# Patient Record
Sex: Female | Born: 1955 | Race: White | Hispanic: No | Marital: Single | Smoking: Never smoker
Health system: Southern US, Community
[De-identification: ages and names within clinical notes are randomized; demographics above are authoritative.]

## PROBLEM LIST (undated history)

## (undated) DIAGNOSIS — M1990 Unspecified osteoarthritis, unspecified site: Secondary | ICD-10-CM

## (undated) DIAGNOSIS — E669 Obesity, unspecified: Secondary | ICD-10-CM

## (undated) DIAGNOSIS — K257 Chronic gastric ulcer without hemorrhage or perforation: Secondary | ICD-10-CM

## (undated) DIAGNOSIS — IMO0002 Reserved for concepts with insufficient information to code with codable children: Secondary | ICD-10-CM

## (undated) DIAGNOSIS — I728 Aneurysm of other specified arteries: Secondary | ICD-10-CM

## (undated) DIAGNOSIS — K219 Gastro-esophageal reflux disease without esophagitis: Secondary | ICD-10-CM

## (undated) DIAGNOSIS — C801 Malignant (primary) neoplasm, unspecified: Secondary | ICD-10-CM

## (undated) DIAGNOSIS — I499 Cardiac arrhythmia, unspecified: Secondary | ICD-10-CM

## (undated) DIAGNOSIS — D509 Iron deficiency anemia, unspecified: Secondary | ICD-10-CM

## (undated) DIAGNOSIS — Z8719 Personal history of other diseases of the digestive system: Secondary | ICD-10-CM

## (undated) DIAGNOSIS — Z862 Personal history of diseases of the blood and blood-forming organs and certain disorders involving the immune mechanism: Secondary | ICD-10-CM

## (undated) DIAGNOSIS — G4730 Sleep apnea, unspecified: Secondary | ICD-10-CM

## (undated) DIAGNOSIS — I10 Essential (primary) hypertension: Secondary | ICD-10-CM

## (undated) DIAGNOSIS — F419 Anxiety disorder, unspecified: Secondary | ICD-10-CM

## (undated) DIAGNOSIS — E7800 Pure hypercholesterolemia, unspecified: Secondary | ICD-10-CM

## (undated) HISTORY — DX: Sleep apnea, unspecified: G47.30

## (undated) HISTORY — DX: Reserved for concepts with insufficient information to code with codable children: IMO0002

## (undated) HISTORY — DX: Chronic gastric ulcer without hemorrhage or perforation: K25.7

## (undated) HISTORY — DX: Gastro-esophageal reflux disease without esophagitis: K21.9

## (undated) HISTORY — DX: Unspecified osteoarthritis, unspecified site: M19.90

## (undated) HISTORY — DX: Pure hypercholesterolemia, unspecified: E78.00

## (undated) HISTORY — DX: Iron deficiency anemia, unspecified: D50.9

## (undated) HISTORY — DX: Personal history of diseases of the blood and blood-forming organs and certain disorders involving the immune mechanism: Z86.2

## (undated) HISTORY — DX: Obesity, unspecified: E66.9

---
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
## 1996-06-16 DIAGNOSIS — IMO0002 Reserved for concepts with insufficient information to code with codable children: Secondary | ICD-10-CM

## 1996-06-16 DIAGNOSIS — R87619 Unspecified abnormal cytological findings in specimens from cervix uteri: Secondary | ICD-10-CM

## 1996-06-16 HISTORY — DX: Reserved for concepts with insufficient information to code with codable children: IMO0002

## 1996-06-16 HISTORY — DX: Unspecified abnormal cytological findings in specimens from cervix uteri: R87.619

## 1997-09-04 ENCOUNTER — Other Ambulatory Visit: Admission: RE | Admit: 1997-09-04 | Discharge: 1997-09-04 | Payer: Self-pay | Admitting: *Deleted

## 1998-03-05 ENCOUNTER — Other Ambulatory Visit: Admission: RE | Admit: 1998-03-05 | Discharge: 1998-03-05 | Payer: Self-pay | Admitting: *Deleted

## 1998-05-16 ENCOUNTER — Other Ambulatory Visit: Admission: RE | Admit: 1998-05-16 | Discharge: 1998-05-16 | Payer: Self-pay | Admitting: *Deleted

## 1999-04-03 ENCOUNTER — Other Ambulatory Visit: Admission: RE | Admit: 1999-04-03 | Discharge: 1999-04-03 | Payer: Self-pay | Admitting: *Deleted

## 2000-01-08 ENCOUNTER — Other Ambulatory Visit: Admission: RE | Admit: 2000-01-08 | Discharge: 2000-01-08 | Payer: Self-pay | Admitting: *Deleted

## 2000-12-10 ENCOUNTER — Other Ambulatory Visit: Admission: RE | Admit: 2000-12-10 | Discharge: 2000-12-10 | Payer: Self-pay | Admitting: *Deleted

## 2002-03-07 ENCOUNTER — Other Ambulatory Visit: Admission: RE | Admit: 2002-03-07 | Discharge: 2002-03-07 | Payer: Self-pay | Admitting: *Deleted

## 2003-01-03 ENCOUNTER — Encounter: Admission: RE | Admit: 2003-01-03 | Discharge: 2003-04-03 | Payer: Self-pay | Admitting: Family Medicine

## 2003-06-28 ENCOUNTER — Other Ambulatory Visit: Admission: RE | Admit: 2003-06-28 | Discharge: 2003-06-28 | Payer: Self-pay | Admitting: *Deleted

## 2004-10-10 ENCOUNTER — Other Ambulatory Visit: Admission: RE | Admit: 2004-10-10 | Discharge: 2004-10-10 | Payer: Self-pay | Admitting: *Deleted

## 2005-07-22 ENCOUNTER — Encounter: Admission: RE | Admit: 2005-07-22 | Discharge: 2005-10-20 | Payer: Self-pay | Admitting: Family Medicine

## 2005-11-11 ENCOUNTER — Encounter: Admission: RE | Admit: 2005-11-11 | Discharge: 2005-11-11 | Payer: Self-pay | Admitting: Family Medicine

## 2005-12-23 ENCOUNTER — Encounter: Admission: RE | Admit: 2005-12-23 | Discharge: 2006-03-23 | Payer: Self-pay | Admitting: Family Medicine

## 2006-03-05 ENCOUNTER — Other Ambulatory Visit: Admission: RE | Admit: 2006-03-05 | Discharge: 2006-03-05 | Payer: Self-pay | Admitting: Obstetrics & Gynecology

## 2006-08-03 ENCOUNTER — Encounter: Admission: RE | Admit: 2006-08-03 | Discharge: 2006-08-03 | Payer: Self-pay | Admitting: Gastroenterology

## 2007-04-06 ENCOUNTER — Encounter: Admission: RE | Admit: 2007-04-06 | Discharge: 2007-07-05 | Payer: Self-pay | Admitting: Family Medicine

## 2007-08-06 ENCOUNTER — Encounter: Admission: RE | Admit: 2007-08-06 | Discharge: 2007-08-06 | Payer: Self-pay | Admitting: Family Medicine

## 2008-05-01 ENCOUNTER — Other Ambulatory Visit: Admission: RE | Admit: 2008-05-01 | Discharge: 2008-05-01 | Payer: Self-pay | Admitting: Obstetrics & Gynecology

## 2011-08-05 ENCOUNTER — Other Ambulatory Visit: Payer: Self-pay | Admitting: Orthopedic Surgery

## 2011-09-01 LAB — HM PAP SMEAR

## 2011-10-17 ENCOUNTER — Other Ambulatory Visit: Payer: Self-pay | Admitting: Orthopedic Surgery

## 2011-10-17 MED ORDER — BUPIVACAINE 0.25 % ON-Q PUMP SINGLE CATH 300ML: 300.0000 mL | INJECTION | Status: DC

## 2011-10-17 MED ORDER — DEXAMETHASONE SODIUM PHOSPHATE 10 MG/ML IJ SOLN: 10.0000 mg | Freq: Once | INTRAMUSCULAR | Status: DC

## 2011-12-25 ENCOUNTER — Encounter (HOSPITAL_COMMUNITY): Payer: Self-pay | Admitting: Pharmacy Technician

## 2011-12-26 ENCOUNTER — Encounter (HOSPITAL_COMMUNITY): Payer: Self-pay | Admitting: Pharmacy Technician

## 2012-01-05 ENCOUNTER — Ambulatory Visit (HOSPITAL_COMMUNITY)
Admission: RE | Admit: 2012-01-05 | Discharge: 2012-01-05 | Disposition: A | Discharge status: Home / Self Care | Payer: BC Managed Care – PPO | Source: Ambulatory Visit | Attending: Orthopedic Surgery | Admitting: Orthopedic Surgery

## 2012-01-05 ENCOUNTER — Encounter (HOSPITAL_COMMUNITY): Payer: Self-pay

## 2012-01-05 ENCOUNTER — Encounter (HOSPITAL_COMMUNITY)
Admission: RE | Admit: 2012-01-05 | Discharge: 2012-01-05 | Disposition: A | Discharge status: Home / Self Care | Payer: BC Managed Care – PPO | Source: Ambulatory Visit | Attending: Orthopedic Surgery | Admitting: Orthopedic Surgery

## 2012-01-05 DIAGNOSIS — M1710 Unilateral primary osteoarthritis, unspecified knee: Secondary | ICD-10-CM | POA: Insufficient documentation

## 2012-01-05 DIAGNOSIS — Z01812 Encounter for preprocedural laboratory examination: Secondary | ICD-10-CM | POA: Insufficient documentation

## 2012-01-05 DIAGNOSIS — I10 Essential (primary) hypertension: Secondary | ICD-10-CM | POA: Insufficient documentation

## 2012-01-05 DIAGNOSIS — Z01810 Encounter for preprocedural cardiovascular examination: Secondary | ICD-10-CM | POA: Insufficient documentation

## 2012-01-05 HISTORY — DX: Unspecified osteoarthritis, unspecified site: M19.90

## 2012-01-05 HISTORY — DX: Personal history of other diseases of the digestive system: Z87.19

## 2012-01-05 HISTORY — DX: Anxiety disorder, unspecified: F41.9

## 2012-01-05 HISTORY — DX: Essential (primary) hypertension: I10

## 2012-01-05 HISTORY — DX: Cardiac arrhythmia, unspecified: I49.9

## 2012-01-05 LAB — URINALYSIS, ROUTINE W REFLEX MICROSCOPIC
Bilirubin Urine: NEGATIVE
Hgb urine dipstick: NEGATIVE
Ketones, ur: NEGATIVE mg/dL
Protein, ur: NEGATIVE mg/dL
Specific Gravity, Urine: 1.019 (ref 1.005–1.030)
Urobilinogen, UA: 0.2 mg/dL (ref 0.0–1.0)

## 2012-01-05 LAB — COMPREHENSIVE METABOLIC PANEL
Alkaline Phosphatase: 48 U/L (ref 39–117)
BUN: 24 mg/dL — ABNORMAL HIGH (ref 6–23)
CO2: 26 mEq/L (ref 19–32)
Chloride: 100 mEq/L (ref 96–112)
Creatinine, Ser: 0.76 mg/dL (ref 0.50–1.10)
GFR calc Af Amer: >90 mL/min (ref >90)
GFR calc non Af Amer: >90 mL/min (ref >90)
Glucose, Bld: 115 mg/dL — ABNORMAL HIGH (ref 70–99)
Potassium: 4.1 mEq/L (ref 3.5–5.1)
Total Bilirubin: 0.4 mg/dL (ref 0.3–1.2)

## 2012-01-05 LAB — CBC
HCT: 38.9 % (ref 36.0–46.0)
Hemoglobin: 13.3 g/dL (ref 12.0–15.0)
MCV: 86.3 fL (ref 78.0–100.0)
WBC: 7.3 10*3/uL (ref 4.0–10.5)

## 2012-01-05 LAB — PROTIME-INR
INR: 0.89 (ref 0.00–1.49)
Prothrombin Time: 12.2 seconds (ref 11.6–15.2)

## 2012-01-05 LAB — APTT: aPTT: 32 seconds (ref 24–37)

## 2012-01-05 LAB — SURGICAL PCR SCREEN: Staphylococcus aureus: INVALID — AB

## 2012-01-05 LAB — URINE MICROSCOPIC-ADD ON

## 2012-01-05 NOTE — Progress Notes (Signed)
06/20/10 Office visit note on chart from Dr Mayford Knife ( cardiology ) and 05/17/10 regarding atrial tachycardia and pac's. Holter monitor report from 1/12 on chart

## 2012-01-05 NOTE — Pre-Procedure Instructions (Signed)
Teach Back Method of teaching completed onpreop appointment of 01/05/12.

## 2012-01-05 NOTE — Progress Notes (Signed)
01/05/12 Fax adn confirmation received regarding abnormal urinalysis with micro results to Dr Lequita Halt.

## 2012-01-05 NOTE — Patient Instructions (Signed)
[birth year]  01/05/2012   Your procedure is scheduled on:  01/12/12 0955am-1100am  Report to [facility] at 0730 AM.  Call this number if you have problems the morning of surgery: [phone]   Remember:   Do not eat food:After Midnight.  May have clear liquids:until Midnight .   Take these medicines the morning of surgery with A SIP OF WATER:   Do not wear jewelry, make-up or nail polish.  Do not wear lotions, powders, or perfumes.   Do not shave 48 hours prior to surgery.  Do not bring valuables to the hospital.  Contacts, dentures or bridgework may not be worn into surgery.  Leave suitcase in the car. After surgery it may be brought to your room.  For patients admitted to the hospital, checkout time is 11:00 AM the day of discharge.      Special Instructions: CHG Shower Use Special Wash: 1/2 bottle night before surgery and 1/2 bottle morning of surgery. shower chin to toes with CHG.  Wash face and private parts with regular soap.     Please read over the following fact sheets that you were given: MRSA Information, Blood Transfusion Fact [place], Incentive Spirometry Fact Sheet, coughing and deep breathing exercises, leg exercises

## 2012-01-05 NOTE — Progress Notes (Signed)
01/05/12 0913  OBSTRUCTIVE SLEEP APNEA  Have you ever been diagnosed with sleep apnea through a sleep study? No  Do you snore loudly (loud enough to be heard through closed doors)?  1  Do you often feel tired, fatigued, or sleepy during the daytime? 1  Has anyone observed you stop breathing during your sleep? 0  Do you have, or are you being treated for high blood pressure? 1  BMI more than 35 kg/m2? 1  Age over 56 years old? 1  Gender: 0  Obstructive Sleep Apnea Score 5   Score 4 or greater  Updated health history

## 2012-01-08 LAB — MRSA CULTURE

## 2012-01-11 ENCOUNTER — Other Ambulatory Visit: Payer: Self-pay | Admitting: Orthopedic Surgery

## 2012-01-11 NOTE — H&P (Signed)
Barbara Jackson  DOB: 11/17/1955 Single / Language: English / Race: White / Female  Date of Admission:  01/12/2012  Chief Complaint: Left Knee Pain  History of Present Illness The patient is a 55 year old female who comes in for a preoperative History and Physical. The patient is scheduled for a left total knee arthroplasty to be performed by Dr. Frank V. Aluisio, MD at [facility] on 01/12/2012. The patient is a 55 year old female who presents for a recheck of Follow-up Knee. The patient is being followed for their left knee pain and osteoarthritis. Symptoms reported today include: pain. The patient feels that they are doing poorly and report their pain level to be moderate to severe. The following medication has been used for pain control: antiinflammatory medication. She is eagerly anticipating her knee surgery. They have been treated conservatively in the past for the above stated problem and despite conservative measures, they continue to have progressive pain and severe functional limitations and dysfunction. They have failed non-operative management including home exercise, medications, and injections. It is felt that they would benefit from undergoing total joint replacement. Risks and benefits of the procedure have been discussed with the patient and they elect to proceed with surgery. There are no active contraindications to surgery such as ongoing infection or rapidly progressive neurological disease.  Problem List/Past Medical Osteoarthritis, Knee (715.96) Anxiety Disorder Diabetes Mellitus, Type II High blood pressure Osteoarthritis Chronic Pain History of PAC's  Allergies Diclofenac Potassium *ANALGESICS - ANTI-INFLAMMATORY* Lisinopril *ANTIHYPERTENSIVES*. Cough.   Family History Hypertension. sister and brother Cancer. mother Chronic Obstructive Lung Disease. father Diabetes Mellitus. grandmother fathers side Father. Deceased, COPD. age  80 Mother. Deceased, Breast Cancer. age 65   Social History Drug/Alcohol Rehab (Previously). no Exercise. Exercises weekly; does other and gym / weights Illicit drug use. no Children. 1 Current work status. working full time Drug/Alcohol Rehab (Currently). no Living situation. live alone Tobacco use. never smoker Marital status. divorced single Number of flights of stairs before winded. 2-3 less than 1 Pain Contract. no Alcohol use. current drinker; drinks wine and hard liquor; only occasionally per week current drinker; drinks wine; only occasionally per week Previously in rehab. 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 Plan to look into rehab versus home.   Medication History CeleBREX (200MG Capsule, 1 (one) Oral daily, Taken starting 08/20/2011) Active. MetFORMIN HCl ( Oral) Specific dose unknown - Active. Losartan Potassium-HCTZ ( Oral) Specific dose unknown - Active. Klor-Con M10 ( Oral) Specific dose unknown - Active. Lexapro (20MG Tablet, Oral) Active. Xanax ( Oral as needed) Specific dose unknown - Active. Ambien ( Oral as needed) Specific dose unknown - Active.   Past Surgical History Appendectomy   Review of Systems General:Not Present- Chills, Fever, Night Sweats, Fatigue, Weight Gain, Weight Loss and Memory Loss. Skin:Not Present- Hives, Itching, Rash, Eczema and Lesions. HEENT:Not Present- Tinnitus, Headache, Double Vision, Visual Loss, Hearing Loss and Dentures. Respiratory:Not Present- Shortness of breath with exertion, Shortness of breath at rest, Allergies, Coughing up blood and Chronic Cough. Cardiovascular:Not Present- Chest Pain, Racing/skipping heartbeats, Difficulty Breathing Lying Down, Murmur, Swelling and Palpitations. Gastrointestinal:Not Present- Bloody Stool, Heartburn, Abdominal Pain, Vomiting, Nausea, Constipation, Diarrhea, Difficulty Swallowing, Jaundice and  Loss of appetitie. Female Genitourinary:Not Present- Blood in Urine, Urinary frequency, Weak urinary stream, Discharge, Flank Pain, Incontinence, Painful Urination, Urgency, Urinary Retention and Urinating at Night. Musculoskeletal:Present- Joint Pain and Morning Stiffness. Not Present- Muscle Weakness, Muscle Pain, Joint Swelling, Back Pain and Spasms.   Neurological:Not Present- Tremor, Dizziness, Blackout spells, Paralysis, Difficulty with balance and Weakness. Psychiatric:Not Present- Insomnia.   Vitals Weight: 235 lb Height: 65 in Body Surface Area: 2.21 m Body Mass Index: 39.11 kg/m Pulse: 72 (Regular) Resp.: 12 (Unlabored) BP: 136/82 (Sitting, Left Arm, Standard)    Physical Exam The physical exam findings are as follows:   General Mental Status - Alert, cooperative and good historian. General Appearance- pleasant. Not in acute distress. Orientation- Oriented X3. Build & Nutrition- Well nourished and Well developed.   Head and Neck Head- normocephalic, atraumatic . Neck Global Assessment- supple. no bruit auscultated on the right and no bruit auscultated on the left.   Eye Pupil- Bilateral- Regular and Round. Motion- Bilateral- EOMI.   Chest and Lung Exam Auscultation: Breath sounds:- clear at anterior chest wall and - clear at posterior chest wall. Adventitious sounds:- No Adventitious sounds.   Cardiovascular Auscultation:Rhythm- Regular rate and rhythm. Heart Sounds- S1 WNL and S2 WNL. Murmurs & Other Heart Sounds:Auscultation of the heart reveals - No Murmurs.   Abdomen Inspection:Contour- Generalized mild distention. Palpation/Percussion:Tenderness- Abdomen is non-tender to palpation. Rigidity (guarding)- Abdomen is soft. Auscultation:Auscultation of the abdomen reveals - Bowel sounds normal.   Female Genitourinary Not done, not pertinent to present illness  Musculoskeletal On exam she is alert and  oriented in no apparent distress. Her left knee shows no effusion. Moderate crepitus on range of motion. Range 5 to 120. There is no instability noted.  Assessment & Plan Osteoarthritis, Knee (715.96) Impression: Left Knee  Note: Patient is for a Left Total Knee Replacement by Dr. Aluisio.  Plan is to go home after the surgery but will look into skilled rehab also.  PCP - Dr. Donna Gates - Patient has been seen preoperatively and fet to be stable for upcoming surgery.  Signed electronically by DREW L Latorsha Curling, PA-C 

## 2012-01-12 ENCOUNTER — Ambulatory Visit (HOSPITAL_COMMUNITY): Payer: BC Managed Care – PPO | Admitting: Anesthesiology

## 2012-01-12 ENCOUNTER — Encounter (HOSPITAL_COMMUNITY): Payer: Self-pay | Admitting: Anesthesiology

## 2012-01-12 ENCOUNTER — Inpatient Hospital Stay (HOSPITAL_COMMUNITY)
Admission: RE | Admit: 2012-01-12 | Discharge: 2012-01-15 | DRG: 209 | Disposition: A | Discharge status: Skilled Nursing Facility | Payer: BC Managed Care – PPO | Source: Ambulatory Visit | Attending: Orthopedic Surgery | Admitting: Orthopedic Surgery

## 2012-01-12 ENCOUNTER — Encounter (HOSPITAL_COMMUNITY)
Admission: RE | Disposition: A | Discharge status: Skilled Nursing Facility | Payer: Self-pay | Source: Ambulatory Visit | Attending: Orthopedic Surgery

## 2012-01-12 ENCOUNTER — Encounter (HOSPITAL_COMMUNITY): Payer: Self-pay | Admitting: *Deleted

## 2012-01-12 DIAGNOSIS — K449 Diaphragmatic hernia without obstruction or gangrene: Secondary | ICD-10-CM | POA: Diagnosis present

## 2012-01-12 DIAGNOSIS — F411 Generalized anxiety disorder: Secondary | ICD-10-CM | POA: Diagnosis present

## 2012-01-12 DIAGNOSIS — M25569 Pain in unspecified knee: Secondary | ICD-10-CM | POA: Diagnosis present

## 2012-01-12 DIAGNOSIS — I10 Essential (primary) hypertension: Secondary | ICD-10-CM | POA: Diagnosis present

## 2012-01-12 DIAGNOSIS — Z79899 Other long term (current) drug therapy: Secondary | ICD-10-CM

## 2012-01-12 DIAGNOSIS — Z6841 Body Mass Index (BMI) 40.0 and over, adult: Secondary | ICD-10-CM

## 2012-01-12 DIAGNOSIS — E119 Type 2 diabetes mellitus without complications: Secondary | ICD-10-CM | POA: Diagnosis present

## 2012-01-12 DIAGNOSIS — I498 Other specified cardiac arrhythmias: Secondary | ICD-10-CM | POA: Diagnosis present

## 2012-01-12 DIAGNOSIS — G8929 Other chronic pain: Secondary | ICD-10-CM | POA: Diagnosis present

## 2012-01-12 DIAGNOSIS — I491 Atrial premature depolarization: Secondary | ICD-10-CM | POA: Diagnosis present

## 2012-01-12 DIAGNOSIS — IMO0002 Reserved for concepts with insufficient information to code with codable children: Principal | ICD-10-CM | POA: Diagnosis present

## 2012-01-12 DIAGNOSIS — M1711 Unilateral primary osteoarthritis, right knee: Secondary | ICD-10-CM | POA: Diagnosis present

## 2012-01-12 DIAGNOSIS — M1710 Unilateral primary osteoarthritis, unspecified knee: Principal | ICD-10-CM | POA: Diagnosis present

## 2012-01-12 DIAGNOSIS — Z96659 Presence of unspecified artificial knee joint: Secondary | ICD-10-CM

## 2012-01-12 HISTORY — PX: TOTAL KNEE ARTHROPLASTY: SHX125

## 2012-01-12 LAB — GLUCOSE, CAPILLARY
Glucose-Capillary: 161 mg/dL — ABNORMAL HIGH (ref 70–99)
Glucose-Capillary: 163 mg/dL — ABNORMAL HIGH (ref 70–99)
Glucose-Capillary: 169 mg/dL — ABNORMAL HIGH (ref 70–99)

## 2012-01-12 SURGERY — ARTHROPLASTY, KNEE, TOTAL
Anesthesia: Spinal | Site: Knee | Laterality: Left | Wound class: Clean

## 2012-01-12 MED ORDER — METHOCARBAMOL 100 MG/ML IJ SOLN
500.0000 mg | Freq: Four times a day (QID) | INTRAVENOUS | Status: DC | PRN
  Administered 2012-01-12: 500 mg via INTRAVENOUS
  Filled 2012-01-12 (×2): qty 5

## 2012-01-12 MED ORDER — MIDAZOLAM HCL 5 MG/5ML IJ SOLN
INTRAMUSCULAR | Status: DC | PRN
  Administered 2012-01-12 (×2): 2 mg via INTRAVENOUS

## 2012-01-12 MED ORDER — SODIUM CHLORIDE 0.9 % IJ SOLN: 9.0000 mL | INTRAMUSCULAR | Status: DC | PRN

## 2012-01-12 MED ORDER — DIPHENHYDRAMINE HCL 12.5 MG/5ML PO ELIX: 12.5000 mg | ORAL_SOLUTION | ORAL | Status: DC | PRN

## 2012-01-12 MED ORDER — TEMAZEPAM 15 MG PO CAPS: 15.0000 mg | ORAL_CAPSULE | Freq: Every evening | ORAL | Status: DC | PRN

## 2012-01-12 MED ORDER — METOCLOPRAMIDE HCL 5 MG/ML IJ SOLN: 5.0000 mg | Freq: Three times a day (TID) | INTRAMUSCULAR | Status: DC | PRN

## 2012-01-12 MED ORDER — NALOXONE HCL 0.4 MG/ML IJ SOLN: 0.4000 mg | INTRAMUSCULAR | Status: DC | PRN

## 2012-01-12 MED ORDER — CEFAZOLIN SODIUM 1-5 GM-% IV SOLN
1.0000 g | Freq: Four times a day (QID) | INTRAVENOUS | Status: AC
  Administered 2012-01-12 (×2): 1 g via INTRAVENOUS
  Filled 2012-01-12 (×2): qty 50

## 2012-01-12 MED ORDER — RIVAROXABAN 10 MG PO TABS
10.0000 mg | ORAL_TABLET | Freq: Every day | ORAL | Status: DC
  Filled 2012-01-12: qty 1

## 2012-01-12 MED ORDER — ONDANSETRON HCL 4 MG/2ML IJ SOLN: 4.0000 mg | Freq: Four times a day (QID) | INTRAMUSCULAR | Status: DC | PRN

## 2012-01-12 MED ORDER — DIPHENHYDRAMINE HCL 50 MG/ML IJ SOLN: 12.5000 mg | Freq: Four times a day (QID) | INTRAMUSCULAR | Status: DC | PRN

## 2012-01-12 MED ORDER — RIVAROXABAN 10 MG PO TABS
10.0000 mg | ORAL_TABLET | Freq: Every day | ORAL | Status: DC
  Administered 2012-01-13 – 2012-01-15 (×3): 10 mg via ORAL
  Filled 2012-01-12 (×5): qty 1

## 2012-01-12 MED ORDER — HYDROMORPHONE HCL PF 1 MG/ML IJ SOLN
INTRAMUSCULAR | Status: AC
  Filled 2012-01-12: qty 1

## 2012-01-12 MED ORDER — CHLORHEXIDINE GLUCONATE 4 % EX LIQD
60.0000 mL | Freq: Once | CUTANEOUS | Status: DC
  Filled 2012-01-12: qty 60

## 2012-01-12 MED ORDER — MENTHOL 3 MG MT LOZG
1.0000 | LOZENGE | OROMUCOSAL | Status: DC | PRN
  Filled 2012-01-12: qty 9

## 2012-01-12 MED ORDER — SODIUM CHLORIDE 0.9 % IV SOLN: INTRAVENOUS | Status: DC

## 2012-01-12 MED ORDER — MORPHINE SULFATE (PF) 1 MG/ML IV SOLN: INTRAVENOUS | Status: DC

## 2012-01-12 MED ORDER — ZOLPIDEM TARTRATE 10 MG PO TABS
10.0000 mg | ORAL_TABLET | Freq: Every evening | ORAL | Status: DC | PRN
  Administered 2012-01-12 – 2012-01-15 (×2): 10 mg via ORAL
  Filled 2012-01-12 (×2): qty 1

## 2012-01-12 MED ORDER — CEFAZOLIN SODIUM-DEXTROSE 2-3 GM-% IV SOLR
INTRAVENOUS | Status: AC
  Filled 2012-01-12: qty 50

## 2012-01-12 MED ORDER — MORPHINE SULFATE (PF) 1 MG/ML IV SOLN
INTRAVENOUS | Status: AC
  Filled 2012-01-12: qty 25

## 2012-01-12 MED ORDER — ACETAMINOPHEN 10 MG/ML IV SOLN
INTRAVENOUS | Status: AC
  Filled 2012-01-12: qty 100

## 2012-01-12 MED ORDER — FLEET ENEMA 7-19 GM/118ML RE ENEM: 1.0000 | ENEMA | Freq: Once | RECTAL | Status: AC | PRN

## 2012-01-12 MED ORDER — ONDANSETRON HCL 4 MG PO TABS: 4.0000 mg | ORAL_TABLET | Freq: Four times a day (QID) | ORAL | Status: DC | PRN

## 2012-01-12 MED ORDER — EPHEDRINE SULFATE 50 MG/ML IJ SOLN
INTRAMUSCULAR | Status: DC | PRN
  Administered 2012-01-12: 5 mg via INTRAVENOUS

## 2012-01-12 MED ORDER — POTASSIUM CHLORIDE CRYS ER 10 MEQ PO TBCR
10.0000 meq | EXTENDED_RELEASE_TABLET | Freq: Every day | ORAL | Status: DC
  Administered 2012-01-13 – 2012-01-15 (×3): 10 meq via ORAL
  Filled 2012-01-12 (×3): qty 1

## 2012-01-12 MED ORDER — ONDANSETRON HCL 4 MG/2ML IJ SOLN
INTRAMUSCULAR | Status: DC | PRN
  Administered 2012-01-12: 4 mg via INTRAVENOUS

## 2012-01-12 MED ORDER — BISACODYL 10 MG RE SUPP: 10.0000 mg | Freq: Every day | RECTAL | Status: DC | PRN

## 2012-01-12 MED ORDER — BUPIVACAINE ON-Q PAIN PUMP (FOR ORDER SET NO CHG)
INJECTION | Status: AC
  Filled 2012-01-12: qty 1

## 2012-01-12 MED ORDER — PROPOFOL 10 MG/ML IV EMUL
INTRAVENOUS | Status: DC | PRN
  Administered 2012-01-12: 75 ug/kg/min via INTRAVENOUS

## 2012-01-12 MED ORDER — ACETAMINOPHEN 10 MG/ML IV SOLN
1000.0000 mg | Freq: Once | INTRAVENOUS | Status: DC
  Filled 2012-01-12: qty 100

## 2012-01-12 MED ORDER — LOSARTAN POTASSIUM 50 MG PO TABS
100.0000 mg | ORAL_TABLET | Freq: Every day | ORAL | Status: DC
  Administered 2012-01-13 – 2012-01-14 (×2): 100 mg via ORAL
  Filled 2012-01-12 (×3): qty 2

## 2012-01-12 MED ORDER — POLYETHYLENE GLYCOL 3350 17 G PO PACK: 17.0000 g | PACK | Freq: Every day | ORAL | Status: DC | PRN

## 2012-01-12 MED ORDER — FENTANYL CITRATE 0.05 MG/ML IJ SOLN
INTRAMUSCULAR | Status: DC | PRN
  Administered 2012-01-12: 50 ug via INTRAVENOUS
  Administered 2012-01-12: 100 ug via INTRAVENOUS
  Administered 2012-01-12: 50 ug via INTRAVENOUS

## 2012-01-12 MED ORDER — PHENOL 1.4 % MT LIQD
1.0000 | OROMUCOSAL | Status: DC | PRN
  Filled 2012-01-12: qty 177

## 2012-01-12 MED ORDER — PROMETHAZINE HCL 25 MG/ML IJ SOLN: 6.2500 mg | INTRAMUSCULAR | Status: DC | PRN

## 2012-01-12 MED ORDER — METFORMIN HCL ER 500 MG PO TB24
500.0000 mg | ORAL_TABLET | Freq: Every day | ORAL | Status: DC
  Administered 2012-01-13: 500 mg via ORAL
  Filled 2012-01-12 (×2): qty 1

## 2012-01-12 MED ORDER — METHOCARBAMOL 500 MG PO TABS
500.0000 mg | ORAL_TABLET | Freq: Four times a day (QID) | ORAL | Status: DC | PRN
  Administered 2012-01-13 – 2012-01-15 (×8): 500 mg via ORAL
  Filled 2012-01-12 (×8): qty 1

## 2012-01-12 MED ORDER — ACETAMINOPHEN 10 MG/ML IV SOLN
INTRAVENOUS | Status: DC | PRN
  Administered 2012-01-12: 1000 mg via INTRAVENOUS

## 2012-01-12 MED ORDER — ACETAMINOPHEN 650 MG RE SUPP: 650.0000 mg | Freq: Four times a day (QID) | RECTAL | Status: DC | PRN

## 2012-01-12 MED ORDER — DEXAMETHASONE SODIUM PHOSPHATE 10 MG/ML IJ SOLN
INTRAMUSCULAR | Status: DC | PRN
  Administered 2012-01-12: 10 mg via INTRAVENOUS

## 2012-01-12 MED ORDER — MORPHINE SULFATE (PF) 1 MG/ML IV SOLN
INTRAVENOUS | Status: DC
  Administered 2012-01-12: 13:00:00 via INTRAVENOUS

## 2012-01-12 MED ORDER — MORPHINE SULFATE (PF) 1 MG/ML IV SOLN
INTRAVENOUS | Status: DC
  Administered 2012-01-12: 4.74 mg via INTRAVENOUS
  Administered 2012-01-12: 13 mg via INTRAVENOUS
  Administered 2012-01-13: 8 mg via INTRAVENOUS
  Filled 2012-01-12: qty 25

## 2012-01-12 MED ORDER — LIDOCAINE HCL (CARDIAC) 20 MG/ML IV SOLN
INTRAVENOUS | Status: DC | PRN
  Administered 2012-01-12: 100 mg via INTRAVENOUS

## 2012-01-12 MED ORDER — OXYCODONE HCL 5 MG PO TABS
5.0000 mg | ORAL_TABLET | ORAL | Status: DC | PRN
  Administered 2012-01-12: 5 mg via ORAL
  Administered 2012-01-13 (×4): 10 mg via ORAL
  Filled 2012-01-12 (×3): qty 2
  Filled 2012-01-12: qty 1
  Filled 2012-01-12: qty 2

## 2012-01-12 MED ORDER — DIPHENHYDRAMINE HCL 12.5 MG/5ML PO ELIX: 12.5000 mg | ORAL_SOLUTION | Freq: Four times a day (QID) | ORAL | Status: DC | PRN

## 2012-01-12 MED ORDER — ACETAMINOPHEN 10 MG/ML IV SOLN
1000.0000 mg | Freq: Four times a day (QID) | INTRAVENOUS | Status: AC
  Administered 2012-01-12 – 2012-01-13 (×4): 1000 mg via INTRAVENOUS
  Filled 2012-01-12 (×6): qty 100

## 2012-01-12 MED ORDER — HYDROMORPHONE HCL PF 1 MG/ML IJ SOLN
0.5000 mg | INTRAMUSCULAR | Status: DC | PRN
  Administered 2012-01-12 (×2): 0.5 mg via INTRAVENOUS

## 2012-01-12 MED ORDER — ALPRAZOLAM 0.5 MG PO TABS
0.5000 mg | ORAL_TABLET | Freq: Every evening | ORAL | Status: DC | PRN
  Administered 2012-01-13: 0.5 mg via ORAL
  Filled 2012-01-12: qty 1

## 2012-01-12 MED ORDER — INSULIN ASPART 100 UNIT/ML ~~LOC~~ SOLN
0.0000 [IU] | Freq: Three times a day (TID) | SUBCUTANEOUS | Status: DC
  Administered 2012-01-12: 3 [IU] via SUBCUTANEOUS
  Administered 2012-01-13 – 2012-01-15 (×6): 2 [IU] via SUBCUTANEOUS
  Administered 2012-01-15: 3 [IU] via SUBCUTANEOUS

## 2012-01-12 MED ORDER — ACETAMINOPHEN 325 MG PO TABS: 650.0000 mg | ORAL_TABLET | Freq: Four times a day (QID) | ORAL | Status: DC | PRN

## 2012-01-12 MED ORDER — LACTATED RINGERS IV SOLN
INTRAVENOUS | Status: DC
  Administered 2012-01-12: 11:00:00 via INTRAVENOUS
  Administered 2012-01-12: 1000 mL via INTRAVENOUS

## 2012-01-12 MED ORDER — CEFAZOLIN SODIUM-DEXTROSE 2-3 GM-% IV SOLR
2.0000 g | INTRAVENOUS | Status: AC
  Administered 2012-01-12: 2 g via INTRAVENOUS

## 2012-01-12 MED ORDER — BUPIVACAINE 0.25 % ON-Q PUMP SINGLE CATH 100 ML
INJECTION | Status: DC | PRN
  Administered 2012-01-12: 300 mL

## 2012-01-12 MED ORDER — DEXTROSE-NACL 5-0.45 % IV SOLN
INTRAVENOUS | Status: DC
  Administered 2012-01-12: 19:00:00 via INTRAVENOUS

## 2012-01-12 MED ORDER — BUPIVACAINE 0.25 % ON-Q PUMP SINGLE CATH 300ML
INJECTION | Status: AC
  Filled 2012-01-12: qty 300

## 2012-01-12 MED ORDER — LOSARTAN POTASSIUM-HCTZ 100-25 MG PO TABS: 1.0000 | ORAL_TABLET | Freq: Every morning | ORAL | Status: DC

## 2012-01-12 MED ORDER — BUPIVACAINE IN DEXTROSE 0.75-8.25 % IT SOLN
INTRATHECAL | Status: DC | PRN
  Administered 2012-01-12: 12 mg via INTRATHECAL

## 2012-01-12 MED ORDER — HYDROCHLOROTHIAZIDE 25 MG PO TABS
25.0000 mg | ORAL_TABLET | Freq: Every day | ORAL | Status: DC
  Administered 2012-01-13 – 2012-01-15 (×3): 25 mg via ORAL
  Filled 2012-01-12 (×3): qty 1

## 2012-01-12 MED ORDER — HYDROMORPHONE HCL PF 1 MG/ML IJ SOLN
0.2500 mg | INTRAMUSCULAR | Status: DC | PRN
  Administered 2012-01-12 (×4): 0.5 mg via INTRAVENOUS

## 2012-01-12 MED ORDER — ESCITALOPRAM OXALATE 20 MG PO TABS
20.0000 mg | ORAL_TABLET | Freq: Every morning | ORAL | Status: DC
  Administered 2012-01-13 – 2012-01-15 (×3): 20 mg via ORAL
  Filled 2012-01-12 (×3): qty 1

## 2012-01-12 MED ORDER — METOCLOPRAMIDE HCL 10 MG PO TABS: 5.0000 mg | ORAL_TABLET | Freq: Three times a day (TID) | ORAL | Status: DC | PRN

## 2012-01-12 MED ORDER — DIPHENHYDRAMINE HCL 12.5 MG/5ML PO ELIX
12.5000 mg | ORAL_SOLUTION | Freq: Four times a day (QID) | ORAL | Status: DC | PRN
  Filled 2012-01-12: qty 5

## 2012-01-12 MED ORDER — DOCUSATE SODIUM 100 MG PO CAPS
100.0000 mg | ORAL_CAPSULE | Freq: Two times a day (BID) | ORAL | Status: DC
  Administered 2012-01-12 – 2012-01-15 (×6): 100 mg via ORAL

## 2012-01-12 SURGICAL SUPPLY — 56 items
BAG SPEC THK2 15X12 ZIP CLS (MISCELLANEOUS) ×1
BAG ZIPLOCK 12X15 (MISCELLANEOUS) ×2 IMPLANT
BANDAGE ELASTIC 6 VELCRO ST LF (GAUZE/BANDAGES/DRESSINGS) ×2 IMPLANT
BANDAGE ESMARK 6X9 LF (GAUZE/BANDAGES/DRESSINGS) ×1 IMPLANT
BANDAGE GAUZE ELAST BULKY 4 IN (GAUZE/BANDAGES/DRESSINGS) ×1 IMPLANT
BLADE SAG 18X100X1.27 (BLADE) ×2 IMPLANT
BLADE SAW SGTL 11.0X1.19X90.0M (BLADE) ×2 IMPLANT
BNDG CMPR 9X6 STRL LF SNTH (GAUZE/BANDAGES/DRESSINGS) ×1
BNDG ESMARK 6X9 LF (GAUZE/BANDAGES/DRESSINGS) ×2
BOWL SMART MIX CTS (DISPOSABLE) ×2 IMPLANT
CATH KIT ON-Q SILVERSOAK 5 (CATHETERS) ×1 IMPLANT
CATH KIT ON-Q SILVERSOAK 5IN (CATHETERS) ×2 IMPLANT
CEMENT HV SMART SET (Cement) ×4 IMPLANT
CLOTH BEACON ORANGE TIMEOUT ST (SAFETY) ×2 IMPLANT
CUFF TOURN SGL QUICK 34 (TOURNIQUET CUFF) ×2
CUFF TRNQT CYL 34X4X40X1 (TOURNIQUET CUFF) ×1 IMPLANT
DRAPE EXTREMITY T 121X128X90 (DRAPE) ×2 IMPLANT
DRAPE POUCH INSTRU U-SHP 10X18 (DRAPES) ×2 IMPLANT
DRAPE U-SHAPE 47X51 STRL (DRAPES) ×2 IMPLANT
DRSG ADAPTIC 3X8 NADH LF (GAUZE/BANDAGES/DRESSINGS) ×2 IMPLANT
DRSG EMULSION OIL 3X16 NADH (GAUZE/BANDAGES/DRESSINGS) ×1 IMPLANT
DRSG PAD ABDOMINAL 8X10 ST (GAUZE/BANDAGES/DRESSINGS) ×1 IMPLANT
DURAPREP 26ML APPLICATOR (WOUND CARE) ×2 IMPLANT
ELECT REM PT RETURN 9FT ADLT (ELECTROSURGICAL) ×2
ELECTRODE REM PT RTRN 9FT ADLT (ELECTROSURGICAL) ×1 IMPLANT
EVACUATOR 1/8 PVC DRAIN (DRAIN) ×2 IMPLANT
FACESHIELD LNG OPTICON STERILE (SAFETY) ×10 IMPLANT
GLOVE BIO SURGEON STRL SZ7.5 (GLOVE) ×2 IMPLANT
GLOVE BIO SURGEON STRL SZ8 (GLOVE) ×2 IMPLANT
GLOVE BIOGEL PI IND STRL 8 (GLOVE) ×2 IMPLANT
GLOVE BIOGEL PI INDICATOR 8 (GLOVE) ×2
GOWN STRL NON-REIN LRG LVL3 (GOWN DISPOSABLE) ×2 IMPLANT
GOWN STRL REIN XL XLG (GOWN DISPOSABLE) ×2 IMPLANT
HANDPIECE INTERPULSE COAX TIP (DISPOSABLE) ×2
IMMOBILIZER KNEE 20 (SOFTGOODS) ×2
IMMOBILIZER KNEE 20 THIGH 36 (SOFTGOODS) ×1 IMPLANT
KIT BASIN OR (CUSTOM PROCEDURE TRAY) ×2 IMPLANT
MANIFOLD NEPTUNE II (INSTRUMENTS) ×2 IMPLANT
NS IRRIG 1000ML POUR BTL (IV SOLUTION) ×2 IMPLANT
PACK TOTAL JOINT (CUSTOM PROCEDURE TRAY) ×2 IMPLANT
PAD ABD 7.5X8 STRL (GAUZE/BANDAGES/DRESSINGS) ×2 IMPLANT
PADDING CAST COTTON 6X4 STRL (CAST SUPPLIES) ×6 IMPLANT
POSITIONER SURGICAL ARM (MISCELLANEOUS) ×2 IMPLANT
SET HNDPC FAN SPRY TIP SCT (DISPOSABLE) ×1 IMPLANT
SPONGE GAUZE 4X4 12PLY (GAUZE/BANDAGES/DRESSINGS) ×2 IMPLANT
STRIP CLOSURE SKIN 1/2X4 (GAUZE/BANDAGES/DRESSINGS) ×4 IMPLANT
SUCTION FRAZIER 12FR DISP (SUCTIONS) ×2 IMPLANT
SUT MNCRL AB 4-0 PS2 18 (SUTURE) ×2 IMPLANT
SUT PDS AB 1 CT1 27 (SUTURE) ×6 IMPLANT
SUT VIC AB 2-0 CT1 27 (SUTURE) ×6
SUT VIC AB 2-0 CT1 TAPERPNT 27 (SUTURE) ×3 IMPLANT
SUT VLOC 180 0 24IN GS25 (SUTURE) ×2 IMPLANT
TOWEL OR 17X26 10 PK STRL BLUE (TOWEL DISPOSABLE) ×4 IMPLANT
TRAY FOLEY CATH 14FRSI W/METER (CATHETERS) ×2 IMPLANT
WATER STERILE IRR 1500ML POUR (IV SOLUTION) ×2 IMPLANT
WRAP KNEE MAXI GEL POST OP (GAUZE/BANDAGES/DRESSINGS) ×3 IMPLANT

## 2012-01-12 NOTE — Interval H&P Note (Signed)
History and Physical Interval Note:  01/12/2012 9:58 AM  Barbara Jackson  has presented today for surgery, with the diagnosis of Osteoarthritis of the Left Knee  The various methods of treatment have been discussed with the patient and family. After consideration of risks, benefits and other options for treatment, the patient has consented to  Procedure(s) (LRB): TOTAL KNEE ARTHROPLASTY (Left) as a surgical intervention .  The patient's history has been reviewed, patient examined, no change in status, stable for surgery.  I have reviewed the patient's chart and labs.  Questions were answered to the patient's satisfaction.     Loanne Drilling

## 2012-01-12 NOTE — Anesthesia Preprocedure Evaluation (Addendum)
Anesthesia Evaluation  Patient identified by MRN, date of birth, ID band Patient awake    Reviewed: Allergy & Precautions, H&P , NPO status , Patient's Chart, lab work & pertinent test results  Airway Mallampati: II TM Distance: >3 FB Neck ROM: Full    Dental No notable dental hx.    Pulmonary  CXR: No active disease. breath sounds clear to auscultation  Pulmonary exam normal       Cardiovascular hypertension, Pt. on medications negative cardio ROS  + dysrhythmias Rhythm:Regular Rate:Normal  H/O PACs.  ECG: Normal     Neuro/Psych Anxiety negative neurological ROS     GI/Hepatic negative GI ROS, Neg liver ROS, hiatal hernia,   Endo/Other  Type 2, Oral Hypoglycemic AgentsMorbid obesity  Renal/GU negative Renal ROS  negative genitourinary   Musculoskeletal negative musculoskeletal ROS (+)   Abdominal (+) + obese,   Peds negative pediatric ROS (+)  Hematology negative hematology ROS (+)   Anesthesia Other Findings   Reproductive/Obstetrics negative OB ROS                          Anesthesia Physical Anesthesia Plan  ASA: III  Anesthesia Plan: Spinal   Post-op Pain Management:    Induction: Intravenous  Airway Management Planned:   Additional Equipment:   Intra-op Plan:   Post-operative Plan: Extubation in OR  Informed Consent: I have reviewed the patients History and Physical, chart, labs and discussed the procedure including the risks, benefits and alternatives for the proposed anesthesia with the patient or authorized representative who has indicated his/her understanding and acceptance.   Dental advisory given  Plan Discussed with: CRNA  Anesthesia Plan Comments: (Discussed risks/benefits of spinal including headache, backache, failure, bleeding, infection, and nerve damage. Patient consents to spinal. Questions answered. Coagulation studies and platelet count acceptable.  )       Anesthesia Quick Evaluation

## 2012-01-12 NOTE — Transfer of Care (Signed)
Immediate Anesthesia Transfer of Care Note  Patient: Barbara Jackson  Procedure(s) Performed: Procedure(s) (LRB): TOTAL KNEE ARTHROPLASTY (Left)  Patient Location: PACU  Anesthesia Type: Regional  Level of Consciousness: awake, alert  and oriented  Airway & Oxygen Therapy: Patient Spontanous Breathing and Patient connected to face mask oxygen  Post-op Assessment: Report given to PACU RN and Post -op Vital signs reviewed and stable  Post vital signs: Reviewed and stable  Complications: No apparent anesthesia complications

## 2012-01-12 NOTE — Anesthesia Postprocedure Evaluation (Signed)
  Anesthesia Post-op Note  Patient: Barbara Jackson  Procedure(s) Performed: Procedure(s) (LRB): TOTAL KNEE ARTHROPLASTY (Left)  Patient Location: PACU  Anesthesia Type: Spinal  Level of Consciousness: awake and alert   Airway and Oxygen Therapy: Patient Spontanous Breathing  Post-op Pain: mild  Post-op Assessment: Post-op Vital signs reviewed, Patient's Cardiovascular Status Stable, Respiratory Function Stable, Patent Airway and No signs of Nausea or vomiting  Post-op Vital Signs: stable  Complications: No apparent anesthesia complications. Increased pain after spinal resolved, now controlled.

## 2012-01-12 NOTE — Op Note (Signed)
Pre-operative diagnosis- Osteoarthritis  Left knee(s)  Post-operative diagnosis- Osteoarthritis Left knee(s)  Procedure-  Left  Total Knee Arthroplasty  Surgeon- Gus Rankin. Zarek Relph, MD  Assistant- Dimitri Ped, PA-C   Anesthesia-  Spinal EBL-* No blood loss amount entered *  Drains Hemovac  Tourniquet time-  Total Tourniquet Time Documented: Thigh (Left) - 43 minutes   Complications- None  Condition-PACU - hemodynamically stable.   Brief Clinical Note  Barbara Jackson is a 56 y.o. year old female with end stage OA of her left knee with progressively worsening pain and dysfunction. She has constant pain, with activity and at rest and significant functional deficits with difficulties even with ADLs. She has had extensive non-op management including analgesics, injections of cortisone and viscosupplements, and home exercise program, but remains in significant pain with significant dysfunction. Radiographs show bone on bone arthritis medial and patellofemoral. She presents now for left Total Knee Arthroplasty.    Procedure in detail---   The patient is brought into the operating room and positioned supine on the operating table. After successful administration of  Spinal,   a tourniquet is placed high on the  Left thigh(s) and the lower extremity is prepped and draped in the usual sterile fashion. Time out is performed by the operating team and then the  Left lower extremity is wrapped in Esmarch, knee flexed and the tourniquet inflated to 300 mmHg.       A midline incision is made with a ten blade through the subcutaneous tissue to the level of the extensor mechanism. A fresh blade is used to make a medial parapatellar arthrotomy. Soft tissue over the proximal medial tibia is subperiosteally elevated to the joint line with a knife and into the semimembranosus bursa with a Cobb elevator. Soft tissue over the proximal lateral tibia is elevated with attention being paid to avoiding the  patellar tendon on the tibial tubercle. The patella is everted, knee flexed 90 degrees and the ACL and PCL are removed. Findings are bone on bone medial and patellofemoral with varus deformity.        The drill is used to create a starting hole in the distal femur and the canal is thoroughly irrigated with sterile saline to remove the fatty contents. The 5 degree Left  valgus alignment guide is placed into the femoral canal and the distal femoral cutting block is pinned to remove 10 mm off the distal femur. Resection is made with an oscillating saw.      The tibia is subluxed forward and the menisci are removed. The extramedullary alignment guide is placed referencing proximally at the medial aspect of the tibial tubercle and distally along the second metatarsal axis and tibial crest. The block is pinned to remove 2mm off the more deficient medial  side. Resection is made with an oscillating saw. Size 2.5is the most appropriate size for the tibia and the proximal tibia is prepared with the modular drill and keel punch for that size.      The femoral sizing guide is placed and size 3 is most appropriate. Rotation is marked off the epicondylar axis and confirmed by creating a rectangular flexion gap at 90 degrees. The size 3 cutting block is pinned in this rotation and the anterior, posterior and chamfer cuts are made with the oscillating saw. The intercondylar block is then placed and that cut is made.      Trial size 2.5 tibial component, trial size 3 posterior stabilized femur and a 12.5  mm  posterior stabilized rotating platform insert trial is placed. Full extension is achieved with excellent varus/valgus and anterior/posterior balance throughout full range of motion. The patella is everted and thickness measured to be 22  mm. Free hand resection is taken to 12 mm, a 35 template is placed, lug holes are drilled, trial patella is placed, and it tracks normally. Osteophytes are removed off the posterior femur  with the trial in place. All trials are removed and the cut bone surfaces prepared with pulsatile lavage. Cement is mixed and once ready for implantation, the size 2.5 tibial implant, size  3 posterior stabilized femoral component, and the size 35 patella are cemented in place and the patella is held with the clamp. The trial insert is placed and the knee held in full extension. All extruded cement is removed and once the cement is hard the permanent 12.5 mm posterior stabilized rotating platform insert is placed into the tibial tray.      The wound is copiously irrigated with saline solution and the extensor mechanism closed over a hemovac drain with #1 PDS suture. The tourniquet is released for a total tourniquet time of 42  minutes. Flexion against gravity is 135 degrees and the patella tracks normally. Subcutaneous tissue is closed with 2.0 vicryl and subcuticular with running 4.0 Monocryl. The catheter for the Marcaine pain pump is placed and the pump is initiated. The incision is cleaned and dried and steri-strips and a bulky sterile dressing are applied. The limb is placed into a knee immobilizer and the patient is awakened and transported to recovery in stable condition.      Please note that a surgical assistant was a medical necessity for this procedure in order to perform it in a safe and expeditious manner. Surgical assistant was necessary to retract the ligaments and vital neurovascular structures to prevent injury to them and also necessary for proper positioning of the limb to allow for anatomic placement of the prosthesis.   Gus Rankin Vance Hochmuth, MD    01/12/2012, 11:26 AM

## 2012-01-12 NOTE — Anesthesia Procedure Notes (Signed)
Spinal  Patient location during procedure: OR End time: 01/12/2012 10:08 AM Staffing CRNA/Resident: Enriqueta Shutter Performed by: resident/CRNA  Preanesthetic Checklist Completed: patient identified, site marked, surgical consent, pre-op evaluation, timeout performed, IV checked, risks and benefits discussed and monitors and equipment checked Spinal Block Patient position: sitting Prep: Betadine Patient monitoring: heart rate, continuous pulse ox and blood pressure Location: L3-4 Injection technique: single-shot Needle Needle type: Sprotte  Needle gauge: 24 G Needle length: 5 cm Assessment Sensory level: T6 Additional Notes Expiration date of kit checked and confirmed. Patient tolerated procedure well, without complications.

## 2012-01-12 NOTE — H&P (View-Only) (Signed)
[facility]  DOB: 01-01-1956 Single / Language: English / Race: White / Female  Date of Admission:  01/12/2012  Chief Complaint: Left Knee Pain  History of Present Illness The patient is a 56 year old female who comes in for a preoperative History and Physical. The patient is scheduled for a left total knee arthroplasty to be performed by Dr. Gus Rankin. Aluisio, MD at [facility] on 01/12/2012. The patient is a 56 year old female who presents for a recheck of Follow-up Knee. The patient is being followed for their left knee pain and osteoarthritis. Symptoms reported today include: pain. The patient feels that they are doing poorly and report their pain level to be moderate to severe. The following medication has been used for pain control: antiinflammatory medication. She is eagerly anticipating her knee surgery. They have been treated conservatively in the past for the above stated problem and despite conservative measures, they continue to have progressive pain and severe functional limitations and dysfunction. They have failed non-operative management including home exercise, medications, and injections. It is felt that they would benefit from undergoing total joint replacement. Risks and benefits of the procedure have been discussed with the patient and they elect to proceed with surgery. There are no active contraindications to surgery such as ongoing infection or rapidly progressive neurological disease.  Problem List/Past Medical Osteoarthritis, Knee (715.96) Anxiety Disorder Diabetes Mellitus, Type II High blood pressure Osteoarthritis Chronic Pain History of PAC's  Allergies Diclofenac Potassium *ANALGESICS - ANTI-INFLAMMATORY* Lisinopril *ANTIHYPERTENSIVES*. Cough.   Family History Hypertension. sister and brother Cancer. mother Chronic Obstructive Lung Disease. father Diabetes Mellitus. grandmother fathers side Father. Deceased, COPD. age  21 Mother. Deceased, Breast Cancer. age 85   Social History Drug/Alcohol Rehab (Previously). no Exercise. Exercises weekly; does other and gym / weights Illicit drug use. no Children. 1 Current work status. working full time Drug/Alcohol Rehab (Currently). no Living situation. live alone Tobacco use. never smoker Marital status. divorced single Number of flights of stairs before winded. 2-3 less than 1 Pain Contract. no Alcohol use. current drinker; drinks wine and hard liquor; only occasionally per week current drinker; drinks wine; only occasionally per week Previously in rehab. 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 Plan to look into rehab versus home.   Medication History CeleBREX (200MG  Capsule, 1 (one) Oral daily, Taken starting 08/20/2011) Active. MetFORMIN HCl ( Oral) Specific dose unknown - Active. Losartan Potassium-HCTZ ( Oral) Specific dose unknown - Active. Klor-Con M10 ( Oral) Specific dose unknown - Active. Lexapro (20MG  Tablet, Oral) Active. Xanax ( Oral as needed) Specific dose unknown - Active. Ambien ( Oral as needed) Specific dose unknown - Active.   Past Surgical History Appendectomy   Review of Systems General:Not Present- Chills, Fever, Night Sweats, Fatigue, Weight Gain, Weight Loss and Memory Loss. Skin:Not Present- Hives, Itching, Rash, Eczema and Lesions. HEENT:Not Present- Tinnitus, Headache, Double Vision, Visual Loss, Hearing Loss and Dentures. Respiratory:Not Present- Shortness of breath with exertion, Shortness of breath at rest, Allergies, Coughing up blood and Chronic Cough. Cardiovascular:Not Present- Chest Pain, Racing/skipping heartbeats, Difficulty Breathing Lying Down, Murmur, Swelling and Palpitations. Gastrointestinal:Not Present- Bloody Stool, Heartburn, Abdominal Pain, Vomiting, Nausea, Constipation, Diarrhea, Difficulty Swallowing, Jaundice and  Loss of appetitie. Female Genitourinary:Not Present- Blood in Urine, Urinary frequency, Weak urinary stream, Discharge, Flank Pain, Incontinence, Painful Urination, Urgency, Urinary Retention and Urinating at Night. Musculoskeletal:Present- Joint Pain and Morning Stiffness. Not Present- Muscle Weakness, Muscle Pain, Joint Swelling, Back Pain and Spasms.  Neurological:Not Present- Tremor, Dizziness, Blackout spells, Paralysis, Difficulty with balance and Weakness. Psychiatric:Not Present- Insomnia.   Vitals Weight: 235 lb Height: 65 in Body Surface Area: 2.21 m Body Mass Index: 39.11 kg/m Pulse: 72 (Regular) Resp.: 12 (Unlabored) BP: 136/82 (Sitting, Left Arm, Standard)    Physical Exam The physical exam findings are as follows:   General Mental Status - Alert, cooperative and good historian. General Appearance- pleasant. Not in acute distress. Orientation- Oriented X3. Build & Nutrition- Well nourished and Well developed.   Head and Neck Head- normocephalic, atraumatic . Neck Global Assessment- supple. no bruit auscultated on the right and no bruit auscultated on the left.   Eye Pupil- Bilateral- Regular and Round. Motion- Bilateral- EOMI.   Chest and Lung Exam Auscultation: Breath sounds:- clear at anterior chest wall and - clear at posterior chest wall. Adventitious sounds:- No Adventitious sounds.   Cardiovascular Auscultation:Rhythm- Regular rate and rhythm. Heart Sounds- S1 WNL and S2 WNL. Murmurs & Other Heart Sounds:Auscultation of the heart reveals - No Murmurs.   Abdomen Inspection:Contour- Generalized mild distention. Palpation/Percussion:Tenderness- Abdomen is non-tender to palpation. Rigidity (guarding)- Abdomen is soft. Auscultation:Auscultation of the abdomen reveals - Bowel sounds normal.   Female Genitourinary Not done, not pertinent to present illness  Musculoskeletal On exam she is alert and  oriented in no apparent distress. Her left knee shows no effusion. Moderate crepitus on range of motion. Range 5 to 120. There is no instability noted.  Assessment & Plan Osteoarthritis, Knee (715.96) Impression: Left Knee  Note: Patient is for a Left Total Knee Replacement by Dr. Lequita Halt.  Plan is to go home after the surgery but will look into skilled rehab also.  PCP - Dr. Shaune Pollack - Patient has been seen preoperatively and fet to be stable for upcoming surgery.  Signed electronically by Roberts Gaudy, PA-C

## 2012-01-13 LAB — GLUCOSE, CAPILLARY: Glucose-Capillary: 174 mg/dL — ABNORMAL HIGH (ref 70–99)

## 2012-01-13 LAB — BASIC METABOLIC PANEL
BUN: 14 mg/dL (ref 6–23)
Chloride: 101 mEq/L (ref 96–112)
GFR calc Af Amer: >90 mL/min (ref >90)
GFR calc non Af Amer: >90 mL/min (ref >90)
Potassium: 4.3 mEq/L (ref 3.5–5.1)
Sodium: 137 mEq/L (ref 135–145)

## 2012-01-13 LAB — CBC
HCT: 36.9 % (ref 36.0–46.0)
MCHC: 33.9 g/dL (ref 30.0–36.0)
RDW: 12.9 % (ref 11.5–15.5)
WBC: 11.3 10*3/uL — ABNORMAL HIGH (ref 4.0–10.5)

## 2012-01-13 MED ORDER — OXYCODONE HCL 5 MG PO TABS
10.0000 mg | ORAL_TABLET | ORAL | Status: DC | PRN
  Administered 2012-01-13 – 2012-01-15 (×11): 20 mg via ORAL
  Filled 2012-01-13 (×11): qty 4

## 2012-01-13 MED ORDER — MORPHINE SULFATE 2 MG/ML IJ SOLN
1.0000 mg | INTRAMUSCULAR | Status: DC | PRN
  Administered 2012-01-13 – 2012-01-14 (×4): 2 mg via INTRAVENOUS
  Filled 2012-01-13 (×4): qty 1

## 2012-01-13 MED ORDER — DEXTROSE 5 % IV SOLN
500.0000 mg | Freq: Once | INTRAVENOUS | Status: AC
  Administered 2012-01-13: 500 mg via INTRAVENOUS
  Filled 2012-01-13: qty 5

## 2012-01-13 NOTE — Progress Notes (Signed)
   Subjective: 1 Day Post-Op Procedure(s) (LRB): TOTAL KNEE ARTHROPLASTY (Left) Patient reports pain as mild.  Start using the ice machine. Patient seen in rounds with Dr. Lequita Halt. Patient is well, and has had no acute complaints or problems We will start therapy today.  Plan is to go Skilled nursing facility after hospital stay.  Objective: Vital signs in last 24 hours: Temp:  [97.3 F (36.3 C)-98.6 F (37 C)] 98 F (36.7 C) (07/30 0541) Pulse Rate:  [52-76] 67  (07/30 0541) Resp:  [9-19] 16  (07/30 0541) BP: (112-151)/(64-86) 143/80 mmHg (07/30 0541) SpO2:  [96 %-100 %] 98 % (07/30 0541) FiO2 (%):  [98 %] 98 % (07/29 1630) Weight:  [115.214 kg (254 lb)] 115.214 kg (254 lb) (07/29 1400)  Intake/Output from previous day:  Intake/Output Summary (Last 24 hours) at 01/13/12 0759 Last data filed at 01/13/12 0542  Gross per 24 hour  Intake 3672.5 ml  Output   3665 ml  Net    7.5 ml    Intake/Output this shift:    Labs:  Basename 01/13/12 0418  HGB 12.5    Basename 01/13/12 0418  WBC 11.3*  RBC 4.33  HCT 36.9  PLT 312    Basename 01/13/12 0418  NA 137  K 4.3  CL 101  CO2 27  BUN 14  CREATININE 0.73  GLUCOSE 175*  CALCIUM 8.8   No results found for this basename: LABPT:2,INR:2 in the last 72 hours  EXAM General - Patient is Alert, Appropriate and Oriented Extremity - Neurovascular intact Sensation intact distally Dorsiflexion/Plantar flexion intact Dressing - dressing C/D/I Motor Function - intact, moving foot and toes well on exam.  Hemovac pulled without difficulty.  Past Medical History  Diagnosis Date  . Hypertension   . Anxiety   . Diabetes mellitus   . H/O hiatal hernia   . Arthritis   . Dysrhythmia     hx of atrial tachycardia and pacs    Assessment/Plan: 1 Day Post-Op Procedure(s) (LRB): TOTAL KNEE ARTHROPLASTY (Left) Principal Problem:  *OA (osteoarthritis) of knee   Advance diet Up with therapy Discharge to SNF  DVT  Prophylaxis - Xarelto Weight-Bearing as tolerated to left leg No vaccines. D/C PCA Morphine, Change to IV push D/C O2 and Pulse OX and try on Room Air  Farhana Fellows 01/13/2012, 7:59 AM

## 2012-01-13 NOTE — Progress Notes (Signed)
Physical Therapy Treatment Note   01/13/12 1400  PT Visit Information  Last PT Received On 01/13/12  Assistance Needed +1  PT Time Calculation  PT Start Time 1348  PT Stop Time 1418  PT Time Calculation (min) 30 min  Subjective Data  Subjective It's really hurting so I'm using my arms more. (during ambulation)  Precautions  Precautions Knee  Precaution Comments d/c KI  Restrictions  LLE Weight Bearing WBAT  Cognition  Overall Cognitive Status Appears within functional limits for tasks assessed/performed  Transfers  Transfers Stand to Sit;Sit to Stand  Sit to Stand 4: Min guard;With upper extremity assist;From chair/3-in-1  Stand to Sit 4: Min guard;With upper extremity assist;To chair/3-in-1  Details for Transfer Assistance verbal cues for technique  Ambulation/Gait  Ambulation/Gait Assistance 4: Min guard  Ambulation Distance (Feet) 40 Feet  Assistive device Rolling walker  Ambulation/Gait Assistance Details pt reports increased pain with WBing so used UE more with RW to assist with pain, pt wished to ambulate with cyrocuff so left off KI and pt did well (no buckling observed)  Gait Pattern Step-to pattern;Decreased stance time - left;Antalgic  Gait velocity decreased  Total Joint Exercises  Quad Sets AROM;Both;15 reps  Ankle Circles/Pumps AROM;Both;10 reps  Short Arc [employer] reps;Left  Heel Slides AAROM;Left;20 reps;Limitations  Heel Slides Limitations limited ROM due to guarding and pain  PT - End of Session  Activity Tolerance Patient limited by fatigue;Patient limited by pain  Patient left in chair;with call bell/phone within reach;with nursing in room  PT - Assessment/Plan  Comments on Treatment Session Pt ambulated in hallway and reports increased L knee pain with WBing.  Pt also performed exercises prior to ambulation.  Pt may need ST-SNF since she lives alone and still with limited ambulation distance due to pain.  Follow Up Recommendations Skilled nursing  facility (possibly progress to HHPT depending on pain control)  Equipment Recommended Rolling walker with 5" wheels;3 in 1 bedside comode  Acute Rehab PT Goals  PT Goal: Sit to Stand - Progress Progressing toward goal  PT Goal: Stand to Sit - Progress Progressing toward goal  PT Goal: Ambulate - Progress Progressing toward goal  PT Goal: Perform Home Exercise Program - Progress Progressing toward goal  PT General Charges  $$ ACUTE PT VISIT 1 Procedure  PT Treatments  $Gait Training 8-22 mins  $Therapeutic Exercise 8-22 mins    Zenovia Jarred, PT Pager: [phone]

## 2012-01-13 NOTE — Progress Notes (Signed)
Clinical Social Work Department CLINICAL SOCIAL WORK PLACEMENT NOTE 01/13/2012  Patient:  Barbara Jackson, Barbara Jackson  Account Number:  [id number] Admit date:  01/12/2012  Clinical Social Worker:  Cori Razor, LCSW  Date/time:  01/13/2012 04:01 PM  Clinical Social Work is seeking post-discharge placement for this patient at the following level of care:   SKILLED NURSING   (*CSW will update this form in Epic as items are completed)     Patient/family provided with [facility] Department of Clinical Social Work's list of facilities offering this level of care within the geographic area requested by the patient (or if unable, by the patient's family).    Patient/family informed of their freedom to choose among providers that offer the needed level of care, that participate in Medicare, Medicaid or managed care program needed by the patient, have an available bed and are willing to accept the patient.    Patient/family informed of [facility]' ownership interest in [facility], as well as of the fact that they are under no obligation to receive care at this facility.  PASARR submitted to EDS on 01/13/2012 PASARR number received from EDS on 01/13/2012  FL2 transmitted to all facilities in geographic area requested by pt/family on  01/13/2012 FL2 transmitted to all facilities within larger geographic area on   Patient informed that his/her managed care company has contracts with or will negotiate with  certain facilities, including the following:     Patient/family informed of bed offers received:  01/13/2012 Patient chooses bed at [facility] Physician recommends and patient chooses bed at    Patient to be transferred to  on   Patient to be transferred to facility by   The following physician request were entered in Epic:   Additional Comments: BCBS prior approval required for ST SNF. Pt is aware and will be kept updated as to approval process.  Cori Razor LCSW  [phone]

## 2012-01-13 NOTE — Evaluation (Signed)
Physical Therapy Evaluation Patient Details Name: Barbara Jackson MRN: [id number] DOB: 05-May-1956 Today's Date: 01/13/2012 Time: 0940-1005 PT Time Calculation (min): 25 min  PT Assessment / Plan / Recommendation Clinical Impression  Pt s/p L TKR.  Pt would benefit from acute PT services in order to improve independence with transfers and ambulation prior to d/c.  Pt will likely progress well and d/c home however lives alone so will keep d/c recommendations updated.    PT Assessment  Patient needs continued PT services    Follow Up Recommendations  Home health PT    Barriers to Discharge Decreased caregiver support      Equipment Recommendations  Rolling walker with 5" wheels;3 in 1 bedside comode    Recommendations for Other Services     Frequency 7X/week    Precautions / Restrictions Precautions Precautions: Knee Precaution Comments: d/c KI Restrictions Weight Bearing Restrictions: No LLE Weight Bearing: Weight bearing as tolerated   Pertinent Vitals/Pain Pt reports 4/10 L knee pain.  cyrocuff applied to pt in recliner with ortho tech assisting      Mobility  Bed Mobility Bed Mobility: Supine to Sit Supine to Sit: 4: Min guard;HOB elevated;With rails Details for Bed Mobility Assistance: verbal cues for technique, pt used bed sheet to assist L LE over EOB Transfers Transfers: Stand to Sit;Sit to Stand Sit to Stand: 4: Min assist;From bed Stand to Sit: 4: Min guard;To chair/3-in-1;With armrests Details for Transfer Assistance: verbal cues for safe technique Ambulation/Gait Ambulation/Gait Assistance: 4: Min guard Ambulation Distance (Feet): 30 Feet Assistive device: Rolling walker Ambulation/Gait Assistance Details: distance limited by pain, verbal cues for sequence and safe use of RW Gait velocity: decreased    Exercises     PT Diagnosis: Difficulty walking;Acute pain  PT Problem List: Decreased strength;Decreased range of motion;Decreased activity  tolerance;Decreased mobility;Decreased knowledge of precautions;Decreased safety awareness;Decreased knowledge of use of DME;Pain PT Treatment Interventions: DME instruction;Gait training;Stair training;Functional mobility training;Therapeutic activities;Therapeutic exercise;Patient/family education   PT Goals Acute Rehab PT Goals PT Goal Formulation: With patient Time For Goal Achievement: 01/20/12 Potential to Achieve Goals: Good Pt will go Supine/Side to Sit: with modified independence PT Goal: Supine/Side to Sit - Progress: Goal set today Pt will go Sit to Supine/Side: with modified independence PT Goal: Sit to Supine/Side - Progress: Goal set today Pt will go Sit to Stand: with modified independence PT Goal: Sit to Stand - Progress: Goal set today Pt will go Stand to Sit: with modified independence PT Goal: Stand to Sit - Progress: Goal set today Pt will Ambulate: 51 - 150 feet;with modified independence;with least restrictive assistive device PT Goal: Ambulate - Progress: Goal set today Pt will Go Up / Down Stairs: 3-5 stairs;with least restrictive assistive device;with supervision PT Goal: Up/Down Stairs - Progress: Goal set today Pt will Perform Home Exercise Program: with supervision, verbal cues required/provided PT Goal: Perform Home Exercise Program - Progress: Goal set today  Visit Information  Last PT Received On: 01/13/12 Assistance Needed: +1    Subjective Data  Subjective: Can we put the cyrocuff on after we're done?   Prior Functioning  Home Living Lives With: Alone Type of Home: House Home Access: Stairs to enter Secretary/administrator of Steps: 3 Entrance Stairs-Rails: Right Home Layout: One level Home Adaptive Equipment: None Prior Function Level of Independence: Independent Communication Communication: No difficulties    Cognition  Overall Cognitive Status: Appears within functional limits for tasks assessed/performed Arousal/Alertness:  Awake/alert Orientation Level: Appears intact for tasks assessed Behavior  During Session: [facility] for tasks performed    Extremity/Trunk Assessment Right Upper Extremity Assessment RUE ROM/Strength/Tone: [facility] for tasks assessed Left Upper Extremity Assessment LUE ROM/Strength/Tone: [facility] for tasks assessed Right Lower Extremity Assessment RLE ROM/Strength/Tone: [facility] for tasks assessed Left Lower Extremity Assessment LLE ROM/Strength/Tone: Deficits LLE ROM/Strength/Tone Deficits: good quad contraction, decreased movement against gravity observed   Balance    End of Session PT - End of Session Equipment Utilized During Treatment: Left knee immobilizer Activity Tolerance: Patient limited by fatigue;Patient limited by pain Patient left: in chair;with call bell/phone within reach  GP     [facility] E 01/13/2012, 10:52 AM Pager: [phone]

## 2012-01-13 NOTE — Progress Notes (Signed)
Clinical Social Work Department BRIEF PSYCHOSOCIAL ASSESSMENT 01/13/2012  Patient:  Barbara Jackson, Barbara Jackson     Account Number:  [id number]     Admit date:  01/12/2012  Clinical Social Worker:  Candie Chroman  Date/Time:  01/13/2012 03:51 PM  Referred by:  Physician  Date Referred:  01/13/2012 Referred for  SNF Placement   Other Referral:   Interview type:  Patient Other interview type:    PSYCHOSOCIAL DATA Living Status:  ALONE Admitted from facility:   Level of care:   Primary support name:  Trinita Devlin Primary support relationship to patient:  SIBLING Degree of support available:   unclear    CURRENT CONCERNS Current Concerns  Post-Acute Placement   Other Concerns:    SOCIAL WORK ASSESSMENT / PLAN Pt is a 56 yr old female living at home prior to hospitalization. CSW met with pt to assist with d/c planning. Pt has made arrangements to have ST Rehab at [facility] following hospital d/c if needed. It is unclear at this time if SNF will be required at d/c. [facility] has been contacted and clinicals sent. BCBS prior approval is needed for ST SNF placement. CSW will assist with this approval process as needed. CSW will continue to follow to assist with d/c planning needs.   Assessment/plan status:  Psychosocial Support/Ongoing Assessment of Needs Other assessment/ plan:   Information/referral to community resources:    PATIENT'S/FAMILY'S RESPONSE TO PLAN OF CARE: Pt will follow recommendations made by her MD regarding d/c plans.    Cori Razor LCSW [phone]

## 2012-01-14 ENCOUNTER — Encounter (HOSPITAL_COMMUNITY): Payer: Self-pay | Admitting: Orthopedic Surgery

## 2012-01-14 LAB — GLUCOSE, CAPILLARY
Glucose-Capillary: 148 mg/dL — ABNORMAL HIGH (ref 70–99)
Glucose-Capillary: 108 mg/dL — ABNORMAL HIGH (ref 70–99)

## 2012-01-14 LAB — BASIC METABOLIC PANEL
BUN: 19 mg/dL (ref 6–23)
Calcium: 8.8 mg/dL (ref 8.4–10.5)
Creatinine, Ser: 0.83 mg/dL (ref 0.50–1.10)
GFR calc Af Amer: >90 mL/min (ref >90)
GFR calc non Af Amer: 78 mL/min — ABNORMAL LOW (ref >90)
Glucose, Bld: 157 mg/dL — ABNORMAL HIGH (ref 70–99)
Potassium: 3.1 mEq/L — ABNORMAL LOW (ref 3.5–5.1)

## 2012-01-14 LAB — CBC
HCT: 33.9 % — ABNORMAL LOW (ref 36.0–46.0)
Hemoglobin: 11.5 g/dL — ABNORMAL LOW (ref 12.0–15.0)
MCH: 29.2 pg (ref 26.0–34.0)
MCHC: 33.9 g/dL (ref 30.0–36.0)
MCV: 86 fL (ref 78.0–100.0)
RDW: 13.1 % (ref 11.5–15.5)

## 2012-01-14 MED ORDER — LORAZEPAM 2 MG/ML IJ SOLN
0.5000 mg | Freq: Four times a day (QID) | INTRAMUSCULAR | Status: DC | PRN
  Administered 2012-01-14: 0.5 mg via INTRAVENOUS
  Filled 2012-01-14: qty 1

## 2012-01-14 NOTE — Evaluation (Signed)
Occupational Therapy Evaluation Patient Details Name: Barbara Jackson MRN: [id number] DOB: December 04, 1955 Today's Date: 01/14/2012 Time: 4098-1191 OT Time Calculation (min): 25 min  OT Assessment / Plan / Recommendation Clinical Impression  Pt doing well POD 2 L TKR. Skilled OT indicated to maximize independence with BADLs to supervision level in prep for d/c to next venue of care or with HHOT.    OT Assessment  Patient needs continued OT Services    Follow Up Recommendations  Skilled nursing facility;Home health OT    Barriers to Discharge Decreased caregiver support    Equipment Recommendations  Rolling walker with 5" wheels;3 in 1 bedside comode    Recommendations for Other Services    Frequency  Min 2X/week    Precautions / Restrictions Precautions Precautions: Knee Restrictions Weight Bearing Restrictions: No LLE Weight Bearing: Weight bearing as tolerated   Pertinent Vitals/Pain     ADL  Grooming: Simulated;Min guard Where Assessed - Grooming: Supported standing Upper Body Bathing: Simulated;Set up Where Assessed - Upper Body Bathing: Unsupported sitting Lower Body Bathing: Simulated;Minimal assistance Where Assessed - Lower Body Bathing: Supported sit to stand Upper Body Dressing: Simulated;Set up Where Assessed - Upper Body Dressing: Unsupported sitting Lower Body Dressing: Performed;Min guard (donning underwear.) Where Assessed - Lower Body Dressing: Supported sit to stand Toilet Transfer: Performed;Min guard Statistician Method: Sit to Barista: Raised toilet seat with arms (or 3-in-1 over toilet) Toileting - Clothing Manipulation and Hygiene: Performed;Min guard Where Assessed - Engineer, mining and Hygiene: Sit to stand from 3-in-1 or toilet Equipment Used: Rolling walker Transfers/Ambulation Related to ADLs: Pt ambulated to the bathroom with minguard A and RW.    OT Diagnosis: Generalized weakness  OT Problem  List: Decreased activity tolerance;Decreased knowledge of use of DME or AE;Pain OT Treatment Interventions: Self-care/ADL training;Therapeutic activities;DME and/or AE instruction;Patient/family education   OT Goals Acute Rehab OT Goals OT Goal Formulation: With patient Time For Goal Achievement: 01/21/12 Potential to Achieve Goals: Good ADL Goals Pt Will Perform Grooming: with supervision;Standing at sink ADL Goal: Grooming - Progress: Goal set today Pt Will Perform Lower Body Bathing: with supervision;Sit to stand from chair;Sit to stand from bed ADL Goal: Lower Body Bathing - Progress: Goal set today Pt Will Perform Lower Body Dressing: with supervision;Sit to stand from bed;Sit to stand from chair ADL Goal: Lower Body Dressing - Progress: Goal set today Pt Will Transfer to Toilet: with supervision;Ambulation;Comfort height toilet;3-in-1 ADL Goal: Toilet Transfer - Progress: Goal set today Pt Will Perform Toileting - Clothing Manipulation: Standing;with supervision ADL Goal: Toileting - Clothing Manipulation - Progress: Goal set today Pt Will Perform Toileting - Hygiene: with supervision;Sit to stand from 3-in-1/toilet ADL Goal: Toileting - Hygiene - Progress: Goal set today  Visit Information  Last OT Received On: 01/14/12 Assistance Needed: +1    Subjective Data  Subjective: My brother may be able to stay with me at night... Patient Stated Goal: to go to rehab following hospitalization.   Prior Functioning  Vision/Perception  Home Living Lives With: Alone Type of Home: House Home Access: Stairs to enter Entrance Stairs-Number of Steps: 3 Entrance Stairs-Rails: Right Home Layout: One level Bathroom Shower/Tub: Walk-in shower;Tub/shower unit Bathroom Toilet: Standard Home Adaptive Equipment: None Prior Function Level of Independence: Independent Able to Take Stairs?: Yes Driving: Yes Vocation: Full time employment Communication Communication: No difficulties        Cognition  Overall Cognitive Status: Appears within functional limits for tasks assessed/performed Arousal/Alertness: Awake/alert Orientation Level: Appears  intact for tasks assessed Behavior During Session: [facility] for tasks performed    Extremity/Trunk Assessment Right Upper Extremity Assessment RUE ROM/Strength/Tone: [facility] for tasks assessed Left Upper Extremity Assessment LUE ROM/Strength/Tone: [facility] for tasks assessed   Mobility Transfers Sit to Stand: 4: Min guard;With upper extremity assist;With armrests;From chair/3-in-1 Stand to Sit: 4: Min guard;Without upper extremity assist;With armrests;To chair/3-in-1 Details for Transfer Assistance: min VCs for hand placement and LE management.   Exercise    Balance    End of Session OT - End of Session Activity Tolerance: Patient tolerated treatment well Patient left: in chair;with call bell/phone within reach  GO     Barbara Jackson A OTR/L [phone] 01/14/2012, 10:14 AM

## 2012-01-14 NOTE — Progress Notes (Signed)
Physical Therapy Treatment Patient Details Name: NOVICE VRBA MRN: [id number] DOB: [birth year] Today's Date: 01/14/2012 Time: 7829-5621 PT Time Calculation (min): 25 min  PT Assessment / Plan / Recommendation Comments on Treatment Session  Pt states, "I hope I have a better night tonight".  Pt c/o L hip pain and requested a heat pack, RN notified. Pt progressing slowly and would benefit from ST SNF as she lives home alone.    Follow Up Recommendations  Skilled nursing facility    Barriers to Discharge        Equipment Recommendations       Recommendations for Other Services    Frequency 7X/week   Plan Discharge plan remains appropriate    Precautions / Restrictions Precautions Precautions: Knee Precaution Comments: Pt unable to perform 10 active ASLR so reapllied KI for amb Required Braces or Orthoses: Knee Immobilizer - Left Restrictions Weight Bearing Restrictions: No LLE Weight Bearing: Weight bearing as tolerated    Pertinent Vitals/Pain C/o 8/10 pain meds requested ICE applied    Mobility  Bed Mobility Bed Mobility: Sit to Supine Sit to Supine: 4: Min assist Details for Bed Mobility Assistance: Min assist to support L LE up onto bed  Transfers Transfers: Sit to Stand;Stand to Sit Sit to Stand: 4: Min guard;From chair/3-in-1 Stand to Sit: 4: Min assist;To bed Details for Transfer Assistance: 25% VC's on proper technique and increased time  Ambulation/Gait Ambulation/Gait Assistance: 4: Min guard Ambulation Distance (Feet): 55 Feet Assistive device: Rolling walker Ambulation/Gait Assistance Details: increased time and increased c/o pain with activity from 3/10 to 8/10.  Pain meds requested and ICE applied. Gait Pattern: Step-to pattern;Decreased stance time - left Gait velocity: decreased    Exercises Total Joint Exercises Ankle Circles/Pumps: AROM;Left;10 reps;Supine Quad Sets: AROM;Left;10 reps;Supine Gluteal Sets: Left;10 reps;Supine;AROM Towel  Squeeze: AROM;Left;10 reps;Supine Heel Slides: AAROM;Left;10 reps;Supine Straight Leg Raises: AAROM;10 reps;Left;Supine   PT Goals                                progressing    Visit Information  Last PT Received On: 01/14/12 Assistance Needed: +1                   End of Session PT - End of Session Equipment Utilized During Treatment: Gait belt Activity Tolerance: Patient tolerated treatment well Patient left: in bed;with call bell/phone within reach   Felecia Shelling  PTA [facility]  Acute  Rehab Pager     [phone]

## 2012-01-14 NOTE — Progress Notes (Signed)
Physical Therapy Treatment Patient Details Name: Barbara Jackson MRN: [id number] DOB: 1955-09-05 Today's Date: 01/14/2012 Time: 1015-1040 PT Time Calculation (min): 25 min  PT Assessment / Plan / Recommendation Comments on Treatment Session  Pt required increased time and is progressing slowly.  Pt would benefit from ST Rehab @ SNF as she lives home alone.    Follow Up Recommendations  Skilled nursing facility    Barriers to Discharge        Equipment Recommendations  Rolling walker with 5" wheels;3 in 1 bedside comode    Recommendations for Other Services    Frequency 7X/week   Plan Discharge plan remains appropriate    Precautions / Restrictions Precautions Precautions: Knee Precaution Comments: Pt unable to perform 10 active ASLR so reapllied KI for amb Required Braces or Orthoses: Knee Immobilizer - Left Restrictions Weight Bearing Restrictions: No LLE Weight Bearing: Weight bearing as tolerated   Pertinent Vitals/Pain C/o 7/10 knee pain ICE applied    Mobility  Bed Mobility Bed Mobility: Not assessed Details for Bed Mobility Assistance: Pt OOB in recliner  Transfers Transfers: Sit to Stand;Stand to Sit Sit to Stand: 4: Min assist;From chair/3-in-1 Stand to Sit: 4: Min assist;To chair/3-in-1 Details for Transfer Assistance: 25% VC's on safety with turn completion  Ambulation/Gait Ambulation/Gait Assistance: 4: Min assist Ambulation Distance (Feet): 50 Feet Assistive device: Rolling walker Ambulation/Gait Assistance Details: 25% VC"s to increase WB thru L LE, pt replys "It's hurts" Gait Pattern: Step-to pattern;Decreased stance time - left Gait velocity: decreased     PT Goals                               progressing    Visit Information  Last PT Received On: 01/14/12 Assistance Needed: +1          Balance   fair with RW  End of Session PT - End of Session Equipment Utilized During Treatment: Gait belt;Left knee immobilizer Activity Tolerance:  Patient limited by pain Patient left: in bed;with call bell/phone within reach   Felecia Shelling  PTA [facility]  Acute  Rehab Pager     [phone]

## 2012-01-15 LAB — CBC
HCT: 33.3 % — ABNORMAL LOW (ref 36.0–46.0)
MCHC: 33.3 g/dL (ref 30.0–36.0)
MCV: 85.6 fL (ref 78.0–100.0)
Platelets: 284 10*3/uL (ref 150–400)
RDW: 13.1 % (ref 11.5–15.5)
WBC: 10.7 10*3/uL — ABNORMAL HIGH (ref 4.0–10.5)

## 2012-01-15 MED ORDER — METHOCARBAMOL 500 MG PO TABS: 500.0000 mg | ORAL_TABLET | Freq: Four times a day (QID) | ORAL | Status: AC | PRN

## 2012-01-15 MED ORDER — DSS 100 MG PO CAPS: 100.0000 mg | ORAL_CAPSULE | Freq: Two times a day (BID) | ORAL | Status: AC

## 2012-01-15 MED ORDER — RIVAROXABAN 10 MG PO TABS: 10.0000 mg | ORAL_TABLET | Freq: Every day | ORAL | Status: DC

## 2012-01-15 MED ORDER — OXYCODONE HCL 10 MG PO TABS: 10.0000 mg | ORAL_TABLET | ORAL | Status: AC | PRN

## 2012-01-15 MED ORDER — ONDANSETRON HCL 4 MG PO TABS: 4.0000 mg | ORAL_TABLET | Freq: Four times a day (QID) | ORAL | Status: AC | PRN

## 2012-01-15 MED ORDER — ACETAMINOPHEN 325 MG PO TABS: 650.0000 mg | ORAL_TABLET | Freq: Four times a day (QID) | ORAL | Status: AC | PRN

## 2012-01-15 MED ORDER — BISACODYL 10 MG RE SUPP: 10.0000 mg | Freq: Every day | RECTAL | Status: AC | PRN

## 2012-01-15 MED ORDER — POLYETHYLENE GLYCOL 3350 17 G PO PACK: 17.0000 g | PACK | Freq: Every day | ORAL | Status: AC | PRN

## 2012-01-15 NOTE — Progress Notes (Signed)
   Subjective: 3 Days Post-Op Procedure(s) (LRB): TOTAL KNEE ARTHROPLASTY (Left) Patient reports pain as mild.   Patient seen in rounds with Dr. Lequita Halt. She is doing better today. Looks more comfortable. Patient is well, and has had no acute complaints or problems Patient is ready to go skilled facility if gets approved.  Objective: Vital signs in last 24 hours: Temp:  [98.1 F (36.7 C)-99.4 F (37.4 C)] 99.4 F (37.4 C) (08/01 0602) Pulse Rate:  [64-93] 93  (08/01 0602) Resp:  [16-18] 18  (08/01 0602) BP: (100-141)/(33-80) 141/80 mmHg (08/01 0602) SpO2:  [90 %-92 %] 90 % (08/01 0602)  Intake/Output from previous day:  Intake/Output Summary (Last 24 hours) at 01/15/12 1030 Last data filed at 01/15/12 0745  Gross per 24 hour  Intake    840 ml  Output      0 ml  Net    840 ml    Intake/Output this shift: Total I/O In: 360 [P.O.:360] Out: -   Labs:  Basename 01/15/12 0410 01/14/12 0350 01/13/12 0418  HGB 11.1* 11.5* 12.5    Basename 01/15/12 0410 01/14/12 0350  WBC 10.7* 12.3*  RBC 3.89 3.94  HCT 33.3* 33.9*  PLT 284 307    Basename 01/14/12 0350 01/13/12 0418  NA 135 137  K 3.1* 4.3  CL 96 101  CO2 29 27  BUN 19 14  CREATININE 0.83 0.73  GLUCOSE 157* 175*  CALCIUM 8.8 8.8   No results found for this basename: LABPT:2,INR:2 in the last 72 hours  EXAM: General - Patient is Alert, Appropriate and Oriented Extremity - Neurovascular intact Sensation intact distally Dorsiflexion/Plantar flexion intact Incision - clean, dry, no drainage, healing Motor Function - intact, moving foot and toes well on exam.   Assessment/Plan: 3 Days Post-Op Procedure(s) (LRB): TOTAL KNEE ARTHROPLASTY (Left) Procedure(s) (LRB): TOTAL KNEE ARTHROPLASTY (Left) Past Medical History  Diagnosis Date  . Hypertension   . Anxiety   . Diabetes mellitus   . H/O hiatal hernia   . Arthritis   . Dysrhythmia     hx of atrial tachycardia and pacs   Principal Problem:  *OA  (osteoarthritis) of knee   Discharge to SNF Diet - Cardiac diet and Diabetic diet Follow up - in 2 weeks Activity - WBAT Disposition - Skilled nursing facility Condition Upon Discharge - Improving D/C Meds - See DC Summary DVT Prophylaxis - Xarelto  Annalysa Mohammad 01/15/2012, 10:30 AM

## 2012-01-15 NOTE — Progress Notes (Signed)
  LATE ENTRY NOTE Date of Service of Visit - 01/14/2012  Subjective: 2 Days Post-Op Procedure(s) (LRB): TOTAL KNEE ARTHROPLASTY (Left) Patient reports pain as mild and moderate.   Patient seen in rounds for Dr. Lequita Halt.  She still had pain but better than the first day after surgery. Patient is well, and has had no acute complaints or problems Plan is to go Skilled nursing facility after hospital stay.  Objective: Vital signs in last 24 hours: Temp:  [98.1 F (36.7 C)-99.4 F (37.4 C)] 99.4 F (37.4 C) (08/01 0602) Pulse Rate:  [64-93] 93  (08/01 0602) Resp:  [16-18] 18  (08/01 0602) BP: (100-141)/(33-80) 141/80 mmHg (08/01 0602) SpO2:  [90 %-92 %] 90 % (08/01 0602)  Intake/Output from previous day:  Intake/Output Summary (Last 24 hours) at 01/15/12 1028 Last data filed at 01/15/12 0745  Gross per 24 hour  Intake    840 ml  Output      0 ml  Net    840 ml    Intake/Output this shift: Total I/O In: 360 [P.O.:360] Out: -   Labs:  Basename 01/15/12 0410 01/14/12 0350 01/13/12 0418  HGB 11.1* 11.5* 12.5    Basename 01/15/12 0410 01/14/12 0350  WBC 10.7* 12.3*  RBC 3.89 3.94  HCT 33.3* 33.9*  PLT 284 307    Basename 01/14/12 0350 01/13/12 0418  NA 135 137  K 3.1* 4.3  CL 96 101  CO2 29 27  BUN 19 14  CREATININE 0.83 0.73  GLUCOSE 157* 175*  CALCIUM 8.8 8.8   No results found for this basename: LABPT:2,INR:2 in the last 72 hours  EXAM General - Patient is Alert, Appropriate and Oriented Extremity - Neurovascular intact Sensation intact distally Dorsiflexion/Plantar flexion intact Dressing/Incision - clean, dry, no drainage, healing Motor Function - intact, moving foot and toes well on exam.   Past Medical History  Diagnosis Date  . Hypertension   . Anxiety   . Diabetes mellitus   . H/O hiatal hernia   . Arthritis   . Dysrhythmia     hx of atrial tachycardia and pacs    Assessment/Plan: 2 Days Post-Op Procedure(s) (LRB): TOTAL KNEE  ARTHROPLASTY (Left) Principal Problem:  *OA (osteoarthritis) of knee   Up with therapy Plan for discharge tomorrow Discharge to SNF  DVT Prophylaxis - Xarelto Weight-Bearing as tolerated to left leg  Zaiden Ludlum 01/15/2012, 10:28 AM

## 2012-01-15 NOTE — Progress Notes (Signed)
CSW assisting with d/c planning. Pt has a SNF bed at [facility] once [employer]BS provides prior approval for placement. Expect decision this morning. CSW will continue to follow to assist with d/c planning needs.  Cori Razor LCSW [phone]

## 2012-01-15 NOTE — Progress Notes (Signed)
Clinical Social Work Department CLINICAL SOCIAL WORK PLACEMENT NOTE 01/15/2012  Patient:  Barbara Jackson, Barbara Jackson  Account Number:  [id number] Admit date:  01/12/2012  Clinical Social Worker:  Cori Razor, LCSW  Date/time:  01/13/2012 04:01 PM  Clinical Social Work is seeking post-discharge placement for this patient at the following level of care:   SKILLED NURSING   (*CSW will update this form in Epic as items are completed)     Patient/family provided with [facility] Department of Clinical Social Work's list of facilities offering this level of care within the geographic area requested by the patient (or if unable, by the patient's family).    Patient/family informed of their freedom to choose among providers that offer the needed level of care, that participate in Medicare, Medicaid or managed care program needed by the patient, have an available bed and are willing to accept the patient.    Patient/family informed of [facility]' ownership interest in [facility], as well as of the fact that they are under no obligation to receive care at this facility.  PASARR submitted to [employer] on 01/13/2012 PASARR number received from [employer] on 01/13/2012  FL2 transmitted to all facilities in geographic area requested by pt/family on  01/13/2012 FL2 transmitted to all facilities within larger geographic area on   Patient informed that his/her managed care company has contracts with or will negotiate with  certain facilities, including the following:     Patient/family informed of bed offers received:  01/13/2012 Patient chooses bed at [facility] Physician recommends and patient chooses bed at    Patient to be transferred to [facility] on  01/15/2012 Patient to be transferred to facility by family transport  The following physician request were entered in Epic:   Additional Comments: BCBS prior approval required for ST SNF. Pt is aware and will be kept updated as to approval  process.  Cori Razor LCSW [phone]

## 2012-01-15 NOTE — Progress Notes (Signed)
Physical Therapy Treatment Patient Details Name: Barbara Jackson MRN: [id number] DOB: July 22, 1955 Today's Date: 01/15/2012 Time: 4098-1191 PT Time Calculation (min): 20 min  PT Assessment / Plan / Recommendation Comments on Treatment Session  Pt plans to D/C to SNF for rehab. Tx session focused on TKR TE's    Follow Up Recommendations  Skilled nursing facility    Barriers to Discharge        Equipment Recommendations  Defer to next venue    Recommendations for Other Services    Frequency 7X/week   Plan Discharge plan remains appropriate    Precautions / Restrictions Precautions Precautions: Knee Precaution Comments: Instructed pt on KI use for amb and when it can be D/C after able to perform 10 active SLR Required Braces or Orthoses: Knee Immobilizer - Left Knee Immobilizer - Left: Discontinue once straight leg raise with < 10 degree lag Restrictions Weight Bearing Restrictions: No LLE Weight Bearing: Weight bearing as tolerated    Pertinent Vitals/Pain C/o 8/10 during TE's ICE applied       Exercises  10 reps of all supine TKR TE'S following handout    PT Goals                                 progressing    Visit Information  Last PT Received On: 01/15/12 Assistance Needed: +1                   End of Session PT - End of Session Equipment Utilized During Treatment: Gait belt;Left knee immobilizer Activity Tolerance: Patient tolerated treatment well Patient left: in chair;with call bell/phone within reach;Other (comment) (ICE to L knee)   Felecia Shelling  PTA [facility]  Acute  Rehab Pager     [phone]

## 2012-01-15 NOTE — Progress Notes (Signed)
Physical Therapy Treatment Patient Details Name: TIFANY HIRSCH MRN: [id number] DOB: September 03, 1955 Today's Date: 01/15/2012 Time: 4098-1191 PT Time Calculation (min): 20 min  PT Assessment / Plan / Recommendation Comments on Treatment Session  Pt plans to D/C to SNF for rehab.    Follow Up Recommendations  Skilled nursing facility    Barriers to Discharge        Equipment Recommendations  Defer to next venue    Recommendations for Other Services    Frequency 7X/week   Plan Discharge plan remains appropriate    Precautions / Restrictions Precautions Precautions: Knee Precaution Comments: Instructed pt on KI use for amb and when it can be D/C after able to perform 10 active SLR Required Braces or Orthoses: Knee Immobilizer - Left Knee Immobilizer - Left: Discontinue once straight leg raise with < 10 degree lag Restrictions Weight Bearing Restrictions: No LLE Weight Bearing: Weight bearing as tolerated    Pertinent Vitals/Pain C/o 6/10 Applied ICE    Mobility  Bed Mobility Bed Mobility: Not assessed Details for Bed Mobility Assistance: Pt OOB in recliner  Transfers Transfers: Sit to Stand;Stand to Sit Sit to Stand: 5: Supervision;4: Min guard Stand to Sit: 5: Supervision Details for Transfer Assistance: one VC on safety with turn completion prior to sit  Ambulation/Gait Ambulation/Gait Assistance: 4: Min guard Ambulation Distance (Feet): 70 Feet Assistive device: Rolling walker Ambulation/Gait Assistance Details: increased, increased time and 25% VC's to increase step length Gait Pattern: Step-to pattern;Decreased stance time - left Gait velocity: decreased     PT Goals                       progressing    Visit Information  Last PT Received On: 01/15/12 Assistance Needed: +1                   End of Session PT - End of Session Equipment Utilized During Treatment: Gait belt;Left knee immobilizer Activity Tolerance: Patient tolerated treatment  well Patient left: in chair;with call bell/phone within reach;Other (comment) (ICE to L knee)   Felecia Shelling  PTA [facility]  Acute  Rehab Pager     [phone]

## 2012-01-15 NOTE — Discharge Summary (Signed)
Physician Discharge Summary   Patient ID: Barbara Jackson MRN: [id number] DOB/AGE: [birth year] 56 y.o.  Admit date: 01/12/2012 Discharge date: 01/15/2012  Primary Diagnosis: Osteoarthritis Left knee   Admission Diagnoses:  Past Medical History  Diagnosis Date  . Hypertension   . Anxiety   . Diabetes mellitus   . H/O hiatal hernia   . Arthritis   . Dysrhythmia     hx of atrial tachycardia and pacs   Discharge Diagnoses:   Principal Problem:  *OA (osteoarthritis) of knee  Procedure:  Procedure(s) (LRB): TOTAL KNEE ARTHROPLASTY (Left)   Consults: None  HPI: Barbara Jackson is a 56 y.o. year old female with end stage OA of her left knee with progressively worsening pain and dysfunction. She has constant pain, with activity and at rest and significant functional deficits with difficulties even with ADLs. She has had extensive non-op management including analgesics, injections of cortisone and viscosupplements, and home exercise program, but remains in significant pain with significant dysfunction. Radiographs show bone on bone arthritis medial and patellofemoral. She presents now for left Total Knee Arthroplasty.    Laboratory Data: Hospital Outpatient Visit on 01/05/2012  Component Date Value Range Status  . aPTT 01/05/2012 32  24 - 37 seconds Final  . WBC 01/05/2012 7.3  4.0 - 10.5 K/uL Final  . RBC 01/05/2012 4.51  3.87 - 5.11 MIL/uL Final  . Hemoglobin 01/05/2012 13.3  12.0 - 15.0 g/dL Final  . HCT 78/29/5621 38.9  36.0 - 46.0 % Final  . MCV 01/05/2012 86.3  78.0 - 100.0 fL Final  . MCH 01/05/2012 29.5  26.0 - 34.0 pg Final  . MCHC 01/05/2012 34.2  30.0 - 36.0 g/dL Final  . RDW 30/86/5784 13.1  11.5 - 15.5 % Final  . Platelets 01/05/2012 335  150 - 400 K/uL Final  . Sodium 01/05/2012 136  135 - 145 mEq/L Final  . Potassium 01/05/2012 4.1  3.5 - 5.1 mEq/L Final   Comment: MODERATE HEMOLYSIS                          HEMOLYSIS AT THIS LEVEL MAY AFFECT RESULT  .  Chloride 01/05/2012 100  96 - 112 mEq/L Final  . CO2 01/05/2012 26  19 - 32 mEq/L Final  . Glucose, Bld 01/05/2012 115* 70 - 99 mg/dL Final  . BUN 69/62/9528 24* 6 - 23 mg/dL Final  . Creatinine, Ser 01/05/2012 0.76  0.50 - 1.10 mg/dL Final  . Calcium 41/32/4401 9.0  8.4 - 10.5 mg/dL Final  . Total Protein 01/05/2012 7.2  6.0 - 8.3 g/dL Final  . Albumin 02/72/5366 3.6  3.5 - 5.2 g/dL Final  . AST 44/08/4740 33  0 - 37 U/L Final   Comment: MODERATE HEMOLYSIS                          HEMOLYSIS AT THIS LEVEL MAY AFFECT RESULT  . ALT 01/05/2012 23  0 - 35 U/L Final  . Alkaline Phosphatase 01/05/2012 48  39 - 117 U/L Final  . Total Bilirubin 01/05/2012 0.4  0.3 - 1.2 mg/dL Final  . GFR calc non Af Amer 01/05/2012 >90  >90 mL/min Final  . GFR calc Af Amer 01/05/2012 >90  >90 mL/min Final   Comment:  The eGFR has been calculated                          using the CKD EPI equation.                          This calculation has not been                          validated in all clinical                          situations.                          eGFR's persistently                          <90 mL/min signify                          possible Chronic Kidney Disease.  Marland Kitchen Prothrombin Time 01/05/2012 12.2  11.6 - 15.2 seconds Final  . INR 01/05/2012 0.89  0.00 - 1.49 Final  . Color, Urine 01/05/2012 YELLOW  YELLOW Final  . APPearance 01/05/2012 CLEAR  CLEAR Final  . Specific Gravity, Urine 01/05/2012 1.019  1.005 - 1.030 Final  . pH 01/05/2012 6.0  5.0 - 8.0 Final  . Glucose, UA 01/05/2012 NEGATIVE  NEGATIVE mg/dL Final  . Hgb urine dipstick 01/05/2012 NEGATIVE  NEGATIVE Final  . Bilirubin Urine 01/05/2012 NEGATIVE  NEGATIVE Final  . Ketones, ur 01/05/2012 NEGATIVE  NEGATIVE mg/dL Final  . Protein, ur 16/03/9603 NEGATIVE  NEGATIVE mg/dL Final  . Urobilinogen, UA 01/05/2012 0.2  0.0 - 1.0 mg/dL Final  . Nitrite 54/02/8118 NEGATIVE  NEGATIVE Final  . Leukocytes,  UA 01/05/2012 MODERATE* NEGATIVE Final  . MRSA, PCR 01/05/2012 INVALID RESULTS, SPECIMEN SENT FOR CULTURE* NEGATIVE Final   Comment: RESULT CALLED TO, READ BACK BY AND VERIFIED WITH:                          ROBERTS,K. AT 1318 ON [id number] BY LOVE,T.  . Staphylococcus aureus 01/05/2012 INVALID RESULTS, SPECIMEN SENT FOR CULTURE* NEGATIVE Final   Comment:                                 The Xpert SA Assay (FDA                          approved for NASAL specimens                          only), is one component of                          a comprehensive surveillance                          program.  It is not intended                          to diagnose infection nor  to                          guide or monitor treatment.  . Squamous Epithelial / LPF 01/05/2012 FEW* RARE Final  . WBC, UA 01/05/2012 7-10  <3 WBC/hpf Final  . Bacteria, UA 01/05/2012 RARE  RARE Final  . Specimen Description 01/05/2012 NOSE   Final  . Special Requests 01/05/2012 NONE   Final  . Culture 01/05/2012    Final                   Value:NO STAPHYLOCOCCUS AUREUS ISOLATED                         Note: No MRSA Isolated  . Report Status 01/05/2012 01/08/2012 FINAL   Final    Basename 01/15/12 0410 01/14/12 0350 01/13/12 0418  HGB 11.1* 11.5* 12.5    Basename 01/15/12 0410 01/14/12 0350  WBC 10.7* 12.3*  RBC 3.89 3.94  HCT 33.3* 33.9*  PLT 284 307    Basename 01/14/12 0350 01/13/12 0418  NA 135 137  K 3.1* 4.3  CL 96 101  CO2 29 27  BUN 19 14  CREATININE 0.83 0.73  GLUCOSE 157* 175*  CALCIUM 8.8 8.8   No results found for this basename: LABPT:2,INR:2 in the last 72 hours  X-Rays:Dg Chest 2 View  01/05/2012  *RADIOLOGY REPORT*  Clinical Data: Preop for left knee replacement, hypertension  CHEST - 2 VIEW  Comparison: None.  Findings: Cardiomediastinal silhouette is unremarkable.  No acute infiltrate or pleural effusion.  No pulmonary edema.  Bony thorax is unremarkable.  IMPRESSION: No active disease.   Original Report Authenticated By: Natasha Mead, M.D.    EKG: Orders placed during the hospital encounter of 01/05/12  . EKG 12-LEAD  . EKG 12-LEAD     Hospital Course: Patient was admitted to [facility] and taken to the OR and underwent the above state procedure without complications.  Patient tolerated the procedure well and was later transferred to the recovery room and then to the orthopaedic floor for postoperative care.  They were given PO and IV analgesics for pain control following their surgery.  They were given 24 hours of postoperative antibiotics and started on DVT prophylaxis in the form of Xarelto.   PT and OT were ordered for total joint protocol.  Discharge planning consulted to help with postop disposition and equipment needs.  Patient had a rough night on the evening of surgery and started to get up OOB with therapy on day one.  PCA Morphine was discontinued and they were weaned over to PO meds.  Hemovac drain was pulled without difficulty.  Continued to work with therapy into day two.  Dressing was changed on day two and the incision was healing well.  Her pain was better on day tow also.  By day three, the patient had progressed with therapy and meeting their goals.  Incision was healing well.  Patient was seen in rounds and was ready to go home.  Discharge Medications: Prior to Admission medications   Medication Sig Start Date End Date Taking? Authorizing Provider  ALPRAZolam Prudy Feeler) 0.5 MG tablet Take 0.5 mg by mouth at bedtime as needed. anxiety   Yes Historical Provider, MD  celecoxib (CELEBREX) 200 MG capsule Take 200 mg by mouth daily.    Yes Historical Provider, MD  escitalopram (LEXAPRO) 20 MG tablet Take 20 mg by mouth  every morning.   Yes Historical Provider, MD  losartan-hydrochlorothiazide (HYZAAR) 100-25 MG per tablet Take 1 tablet by mouth every morning.   Yes Historical Provider, MD  metFORMIN (GLUCOPHAGE-XR) 500 MG 24 hr tablet Take 500 mg by mouth daily  with breakfast.   Yes Historical Provider, MD  potassium chloride (K-DUR,KLOR-CON) 10 MEQ tablet Take 10 mEq by mouth daily.   Yes Historical Provider, MD  zolpidem (AMBIEN) 10 MG tablet Take 10 mg by mouth at bedtime as needed. sleep   Yes Historical Provider, MD  acetaminophen (TYLENOL) 325 MG tablet Take 2 tablets (650 mg total) by mouth every 6 (six) hours as needed (or Fever >/= 101). 01/15/12 01/14/13  Alexzandrew Perkins, PA  bisacodyl (DULCOLAX) 10 MG suppository Place 1 suppository (10 mg total) rectally daily as needed. 01/15/12 01/25/12  Alexzandrew Perkins, PA  docusate sodium 100 MG CAPS Take 100 mg by mouth 2 (two) times daily. 01/15/12 01/25/12  Alexzandrew Perkins, PA  methocarbamol (ROBAXIN) 500 MG tablet Take 1 tablet (500 mg total) by mouth every 6 (six) hours as needed. 01/15/12 01/25/12  Alexzandrew Perkins, PA  ondansetron (ZOFRAN) 4 MG tablet Take 1 tablet (4 mg total) by mouth every 6 (six) hours as needed for nausea. 01/15/12 01/22/12  Alexzandrew Perkins, PA  oxyCODONE 10 MG TABS Take 1-2 tablets (10-20 mg total) by mouth every 4 (four) hours as needed for pain. 01/15/12 01/25/12  Alexzandrew Perkins, PA  polyethylene glycol (MIRALAX / GLYCOLAX) packet Take 17 g by mouth daily as needed. 01/15/12 01/18/12  Alexzandrew Julien Girt, PA  rivaroxaban (XARELTO) 10 MG TABS tablet Take 1 tablet (10 mg total) by mouth daily with breakfast. Take Xarelto for two and a half more weeks, then discontinue Xarelto. 01/15/12   Alexzandrew Julien Girt, PA    Diet: Cardiac diet and Diabetic diet Activity:WBAT Follow-up:in 2 weeks Disposition - Skilled nursing facility - Plan for [facility], pending insurance approval Discharged Condition: improving.   Discharge Orders    Future Orders Please Complete By Expires   Diet - low sodium heart healthy      Diet Carb Modified      Call MD / Call 911      Comments:   If you experience chest pain or shortness of breath, CALL 911 and be transported to the hospital emergency  room.  If you develope a fever above 101 F, pus (white drainage) or increased drainage or redness at the wound, or calf pain, call your surgeon's office.   Discharge instructions      Comments:   Pick up stool softner and laxative for home. Do not submerge incision under water. May shower. Continue to use ice for pain and swelling from surgery.  Take Xarelto for two and a half more weeks, then discontinue Xarelto.  When discharged from the skilled rehab facility, please have the facility set up the patient's Home Health Physical Therapy prior to being released.  Also provide the patient with their medications at time of release from the facility to include their pain medication, the muscle relaxants, and their blood thinner medication.  If the patient is still at the rehab facility at time of follow up appointment, please also assist the patient in arranging follow up appointment in our office and any transportation needs.   Constipation Prevention      Comments:   Drink plenty of fluids.  Prune juice may be helpful.  You may use a stool softener, such as Colace (over the counter) 100 mg twice  a day.  Use MiraLax (over the counter) for constipation as needed.   Increase activity slowly as tolerated      Patient may shower      Comments:   You may shower without a dressing once there is no drainage.  Do not wash over the wound.  If drainage remains, do not shower until drainage stops.   Driving restrictions      Comments:   No driving until released by the physician.   Lifting restrictions      Comments:   No lifting until released by the physician.   TED hose      Comments:   Use stockings (TED hose) for 3 weeks on both leg(s).  You may remove them at night for sleeping.   Change dressing      Comments:   Change dressing daily with sterile 4 x 4 inch gauze dressing and apply TED hose. Do not submerge the incision under water.   Do not put a pillow under the knee. Place it under the  heel.      Do not sit on low chairs, stoools or toilet seats, as it may be difficult to get up from low surfaces        Medication List  As of 01/15/2012 10:36 AM   TAKE these medications         acetaminophen 325 MG tablet   Commonly known as: TYLENOL   Take 2 tablets (650 mg total) by mouth every 6 (six) hours as needed (or Fever >/= 101).      ALPRAZolam 0.5 MG tablet   Commonly known as: XANAX   Take 0.5 mg by mouth at bedtime as needed. anxiety      bisacodyl 10 MG suppository   Commonly known as: DULCOLAX   Place 1 suppository (10 mg total) rectally daily as needed.      celecoxib 200 MG capsule   Commonly known as: CELEBREX   Take 200 mg by mouth daily.      DSS 100 MG Caps   Take 100 mg by mouth 2 (two) times daily.      escitalopram 20 MG tablet   Commonly known as: LEXAPRO   Take 20 mg by mouth every morning.      losartan-hydrochlorothiazide 100-25 MG per tablet   Commonly known as: HYZAAR   Take 1 tablet by mouth every morning.      metFORMIN 500 MG 24 hr tablet   Commonly known as: GLUCOPHAGE-XR   Take 500 mg by mouth daily with breakfast.      methocarbamol 500 MG tablet   Commonly known as: ROBAXIN   Take 1 tablet (500 mg total) by mouth every 6 (six) hours as needed.      ondansetron 4 MG tablet   Commonly known as: ZOFRAN   Take 1 tablet (4 mg total) by mouth every 6 (six) hours as needed for nausea.      Oxycodone HCl 10 MG Tabs   Take 1-2 tablets (10-20 mg total) by mouth every 4 (four) hours as needed for pain.      polyethylene glycol packet   Commonly known as: MIRALAX / GLYCOLAX   Take 17 g by mouth daily as needed.      potassium chloride 10 MEQ tablet   Commonly known as: K-DUR,KLOR-CON   Take 10 mEq by mouth daily.      rivaroxaban 10 MG Tabs tablet   Commonly known as: XARELTO   Take  1 tablet (10 mg total) by mouth daily with breakfast. Take Xarelto for two and a half more weeks, then discontinue Xarelto.      zolpidem 10 MG tablet    Commonly known as: AMBIEN   Take 10 mg by mouth at bedtime as needed. sleep           Follow-up Information    Follow up with Loanne Drilling, MD. Schedule an appointment as soon as possible for a visit in 2 weeks.   Contact information:   [facility] [address] [place] [address] [phone]          Signed: Patrica Duel 01/15/2012, 10:36 AM

## 2012-01-15 NOTE — Care Management Note (Signed)
    Page 1 of 2   01/15/2012     3:25:37 PM   CARE MANAGEMENT NOTE 01/15/2012  Patient:  Barbara Jackson, Barbara Jackson   Account Number:  [id number]  Date Initiated:  01/15/2012  Documentation initiated by:  Colleen Can  Subjective/Objective Assessment:   dx osteoarthritis knee-left; total knee replacemnt     Action/Plan:   snf-wants [facility]   Anticipated DC Date:  01/15/2012   Anticipated DC Plan:  SKILLED NURSING FACILITY  In-house referral  Clinical Social Worker      DC Planning Services  NA      [facility] Choice  NA   Choice offered to / List presented to:  NA   DME arranged  NA      DME agency  NA     HH arranged  NA      HH agency  NA   Status of service:  Completed, signed off Medicare Important Message given?  NO (If response is "NO", the following Medicare IM given date fields will be blank) Date Medicare IM given:   Date Additional Medicare IM given:    Discharge Disposition:  SKILLED NURSING FACILITY  Per UR Regulation:  Reviewed for med. necessity/level of care/duration of stay  If discussed at Long Length of Stay Meetings, dates discussed:    Comments:

## 2012-01-15 NOTE — Progress Notes (Signed)
Occupational Therapy Treatment Patient Details Name: Barbara Jackson MRN: [id number] DOB: 03-20-1956 Today's Date: 01/15/2012 Time: 1191-4782 OT Time Calculation (min): 34 min  OT Assessment / Plan / Recommendation Comments on Treatment Session Pt doing well; moves slowly.  Needs minimal incidental help    Follow Up Recommendations  Skilled nursing facility    Barriers to Discharge       Equipment Recommendations       Recommendations for Other Services    Frequency Min 2X/week   Plan      Precautions / Restrictions Precautions Precautions: Knee Required Braces or Orthoses: Knee Immobilizer - Left Restrictions LLE Weight Bearing: Weight bearing as tolerated   Pertinent Vitals/Pain 3/10 L knee; repositioned and applied cold system    ADL  Grooming: Performed;Teeth care;Supervision/safety Where Assessed - Grooming: Supported standing Toilet Transfer: Performed;Supervision/safety;Other (comment) Toilet Transfer Method: Sit to stand Toileting - Clothing Manipulation and Hygiene: Performed;Supervision/safety Where Assessed - Toileting Clothing Manipulation and Hygiene: Sit to stand from 3-in-1 or toilet Tub/Shower Transfer: Performed;Supervision/safety Tub/Shower Transfer Method: Science writer: Walk in shower Equipment Used: Rolling walker Transfers/Ambulation Related to ADLs: pt moves slowly.  Used heel toe method during turns.  Cued to push through arms and lift LLE during turns.  Pt initially anxoius about shower transfer but able to do this with min cues ADL Comments: Pt got herself bathed and dressed this am.  Will not wear socks at home.  Managing cold wrap system with incidental help to position items    OT Diagnosis:    OT Problem List:   OT Treatment Interventions:     OT Goals ADL Goals Pt Will Perform Grooming: with supervision;Standing at sink ADL Goal: Grooming - Progress: Met Pt Will Perform Lower Body Bathing: with  supervision;Sit to stand from chair;Sit to stand from bed ADL Goal: Lower Body Bathing - Progress: Other (comment) (reports she did herself) Pt Will Perform Lower Body Dressing: with caregiver independent in assisting Pt Will Transfer to Toilet: with supervision;Ambulation;Comfort height toilet;3-in-1 ADL Goal: Toilet Transfer - Progress: Met Pt Will Perform Toileting - Clothing Manipulation: Standing;with supervision ADL Goal: Toileting - Clothing Manipulation - Progress: Met Pt Will Perform Toileting - Hygiene: with supervision;Sit to stand from 3-in-1/toilet ADL Goal: Toileting - Hygiene - Progress: Met Pt Will Perform Tub/Shower Transfer: Shower transfer;with supervision;Ambulation ADL Goal: Tub/Shower Transfer - Progress: Goal set today Miscellaneous OT Goals Miscellaneous OT Goal #1: pt will gather clothes at supervision level with rw OT Goal: Miscellaneous Goal #1 - Progress: Goal set today  Visit Information  Last OT Received On: 01/15/12 Assistance Needed: +1    Subjective Data      Prior Functioning       Cognition  Overall Cognitive Status: Appears within functional limits for tasks assessed/performed Behavior During Session: [facility] for tasks performed    Mobility Bed Mobility Bed Mobility: Sit to Supine Supine to Sit: Other (comment);4: Min assist (using sheet:  assist to remove sheet from foot) Transfers Sit to Stand: 5: Supervision;From bed;With upper extremity assist   Exercises    Balance    End of Session OT - End of Session Activity Tolerance: Patient tolerated treatment well Patient left: in chair;with call bell/phone within reach  GO     Cathline Dowen 01/15/2012, 11:10 AM Marica Otter, OTR/L [phone] 01/15/2012

## 2012-01-16 LAB — GLUCOSE, CAPILLARY
Glucose-Capillary: 113 mg/dL — ABNORMAL HIGH (ref 70–99)
Glucose-Capillary: 155 mg/dL — ABNORMAL HIGH (ref 70–99)

## 2012-09-28 ENCOUNTER — Encounter: Payer: Self-pay | Admitting: Obstetrics & Gynecology

## 2012-09-28 ENCOUNTER — Ambulatory Visit (INDEPENDENT_AMBULATORY_CARE_PROVIDER_SITE_OTHER): Payer: BC Managed Care – PPO | Admitting: Obstetrics & Gynecology

## 2012-09-28 VITALS — BP 118/74 | Ht 65.0 in | Wt 225.0 lb

## 2012-09-28 DIAGNOSIS — Z01419 Encounter for gynecological examination (general) (routine) without abnormal findings: Secondary | ICD-10-CM

## 2012-09-28 DIAGNOSIS — Z124 Encounter for screening for malignant neoplasm of cervix: Secondary | ICD-10-CM

## 2012-09-28 DIAGNOSIS — E1165 Type 2 diabetes mellitus with hyperglycemia: Secondary | ICD-10-CM | POA: Insufficient documentation

## 2012-09-28 DIAGNOSIS — I10 Essential (primary) hypertension: Secondary | ICD-10-CM | POA: Insufficient documentation

## 2012-09-28 DIAGNOSIS — Z0000 Encounter for general adult medical examination without abnormal findings: Secondary | ICD-10-CM

## 2012-09-28 LAB — POCT URINALYSIS DIPSTICK: Urobilinogen, UA: NEGATIVE

## 2012-09-28 NOTE — Patient Instructions (Signed)

## 2012-09-28 NOTE — Progress Notes (Signed)
57 y.o. G1P1001 DivorcedCaucasianF here for annual exam.  No vaginal bleeding.  Had total knee done in July with Dr. Despina Hick.  Really happy with how she's done.  Exercising more--definitely!!  Last visit with Dr. Kevan Ny was 2/14.  Last HbA1C was 7ish.    No LMP recorded. Patient is postmenopausal.          Sexually active: no  The current method of family planning is post menopausal status.    Exercising: yes  Walking, Swimming, Runner, broadcasting/film/video 5x/wk Smoker:  no  Health Maintenance: Pap:  09/01/11 MMG:  08/2009 Colonoscopy:  2009 BMD:   none TDaP:  2007 Labs: PCP Urine: Trace LEUKS 1   reports that she has never smoked. She has never used smokeless tobacco. She reports that  drinks alcohol. She reports that she does not use illicit drugs.  Past Medical History  Diagnosis Date  . Anxiety   . Diabetes mellitus   . H/O hiatal hernia   . Arthritis   . Dysrhythmia     hx of atrial tachycardia and pacs  . Abnormal pap 1998    Negative Hpv  . Hypertension   . Diabetes     Past Surgical History  Procedure Laterality Date  . Total knee arthroplasty  01/12/2012    Procedure: TOTAL KNEE ARTHROPLASTY;  Surgeon: Loanne Drilling, MD;  Location: [facility]S;  Service: Orthopedics;  Laterality: Left;  . Appendectomy  1975    Current Outpatient Prescriptions  Medication Sig Dispense Refill  . acetaminophen (TYLENOL) 325 MG tablet Take 2 tablets (650 mg total) by mouth every 6 (six) hours as needed (or Fever >/= 101).  60 tablet  0  . ALPRAZolam (XANAX) 0.5 MG tablet Take 0.5 mg by mouth at bedtime as needed. anxiety      . celecoxib (CELEBREX) 200 MG capsule Take 200 mg by mouth as needed.       Marland Kitchen CLONAZEPAM PO Take by mouth as needed.       Marland Kitchen escitalopram (LEXAPRO) 20 MG tablet Take 20 mg by mouth every morning.      Marland Kitchen losartan-hydrochlorothiazide (HYZAAR) 100-25 MG per tablet Take 1 tablet by mouth every morning.      . metFORMIN (GLUCOPHAGE-XR) 500 MG 24 hr tablet Take 500 mg by mouth QID.        . Multiple Vitamins-Minerals (MULTIVITAMIN PO) Take by mouth.      . potassium chloride (K-DUR,KLOR-CON) 10 MEQ tablet Take 10 mEq by mouth daily.      . Potassium Chloride (KCL-20 PO) Take by mouth.      . traMADol (ULTRAM) 50 MG tablet Take 50 mg by mouth as needed for pain.      Marland Kitchen zolpidem (AMBIEN) 10 MG tablet Take 10 mg by mouth at bedtime as needed. sleep      . Estradiol (ESTRACE VA) Place vaginally.       No current facility-administered medications for this visit.    Family History  Problem Relation Age of Onset  . Breast cancer Mother 60    ROS:  Pertinent items are noted in HPI.  Otherwise, a comprehensive ROS was negative.  Exam:   BP 118/74  Ht 5\' 5"  (1.651 m)  Wt 225 lb (102.059 kg)  BMI 37.44 kg/m2  Weight change: -2 pounds Height:   Height: 5\' 5"  (165.1 cm)  Ht Readings from Last 3 Encounters:  09/28/12 5\' 5"  (1.651 m)  01/12/12 5\' 5"  (1.651 m)  01/12/12 5\' 5"  (1.651 m)  General appearance: alert, cooperative and appears stated age Head: Normocephalic, without obvious abnormality, atraumatic Neck: no adenopathy, supple, symmetrical, trachea midline and thyroid normal to inspection and palpation Lungs: clear to auscultation bilaterally Breasts: normal appearance, no masses or tenderness Heart: regular rate and rhythm Abdomen: soft, non-tender; bowel sounds normal; no masses,  no organomegaly Extremities: extremities normal, atraumatic, no cyanosis or edema Skin: Skin color, texture, turgor normal. No rashes or lesions Lymph nodes: Cervical, supraclavicular, and axillary nodes normal. No abnormal inguinal nodes palpated Neurologic: Grossly normal   Pelvic: External genitalia:  no lesions              Urethra:  normal appearing urethra with no masses, tenderness or lesions              Bartholins and Skenes: normal                 Vagina: normal appearing vagina with normal color and discharge, no lesions              Cervix: no lesions               Pap taken: yes Bimanual Exam:  Uterus:  normal size, contour, position, consistency, mobility, non-tender              Adnexa: no mass, fullness, tenderness               Rectovaginal: Confirms               Anus:  normal sphincter tone, no lesions  A:  Well Woman with normal exam PMP, no HRT H/O abnormal Paps with neg HR HPV 2011.  Not SA. Hypertension, DM--followed by Dr. Kevan Ny  P:   Mammogram encouraged.  Patient declines having me schedule this for her.  Number given to [facility]  3D MMG discussed. pap smear today.  If ASCUS, will repeat HR HPV Labs with Dr. Kevan Ny every six months. return annually or prn  An After Visit Summary was printed and given to the patient.

## 2012-09-30 LAB — IPS PAP TEST WITH REFLEX TO HPV

## 2013-10-28 ENCOUNTER — Ambulatory Visit: Payer: BC Managed Care – PPO | Admitting: Obstetrics & Gynecology

## 2013-11-16 ENCOUNTER — Ambulatory Visit (INDEPENDENT_AMBULATORY_CARE_PROVIDER_SITE_OTHER): Payer: BC Managed Care – PPO

## 2013-11-16 VITALS — Resp 16 | Ht 65.0 in | Wt 245.0 lb

## 2013-11-16 DIAGNOSIS — R200 Anesthesia of skin: Secondary | ICD-10-CM

## 2013-11-16 DIAGNOSIS — R202 Paresthesia of skin: Principal | ICD-10-CM

## 2013-11-16 DIAGNOSIS — E1149 Type 2 diabetes mellitus with other diabetic neurological complication: Secondary | ICD-10-CM

## 2013-11-16 DIAGNOSIS — E1140 Type 2 diabetes mellitus with diabetic neuropathy, unspecified: Secondary | ICD-10-CM

## 2013-11-16 DIAGNOSIS — R209 Unspecified disturbances of skin sensation: Secondary | ICD-10-CM

## 2013-11-16 DIAGNOSIS — E1142 Type 2 diabetes mellitus with diabetic polyneuropathy: Secondary | ICD-10-CM

## 2013-11-16 NOTE — Patient Instructions (Signed)
Diabetes and Foot Care Diabetes may cause you to have problems because of poor blood supply (circulation) to your feet and legs. This may cause the skin on your feet to become thinner, break easier, and heal more slowly. Your skin may become dry, and the skin may peel and crack. You may also have nerve damage in your legs and feet causing decreased feeling in them. You may not notice minor injuries to your feet that could lead to infections or more serious problems. Taking care of your feet is one of the most important things you can do for yourself.  HOME CARE INSTRUCTIONS  Wear shoes at all times, even in the house. Do not go barefoot. Bare feet are easily injured.  Check your feet daily for blisters, cuts, and redness. If you cannot see the bottom of your feet, use a mirror or ask someone for help.  Wash your feet with warm water (do not use hot water) and mild soap. Then pat your feet and the areas between your toes until they are completely dry. Do not soak your feet as this can dry your skin.  Apply a moisturizing lotion or petroleum jelly (that does not contain alcohol and is unscented) to the skin on your feet and to dry, brittle toenails. Do not apply lotion between your toes.  Trim your toenails straight across. Do not dig under them or around the cuticle. File the edges of your nails with an emery board or nail file.  Do not cut corns or calluses or try to remove them with medicine.  Wear clean socks or stockings every day. Make sure they are not too tight. Do not wear knee-high stockings since they may decrease blood flow to your legs.  Wear shoes that fit properly and have enough cushioning. To break in new shoes, wear them for just a few hours a day. This prevents you from injuring your feet. Always look in your shoes before you put them on to be sure there are no objects inside.  Do not cross your legs. This may decrease the blood flow to your feet.  If you find a minor scrape,  cut, or break in the skin on your feet, keep it and the skin around it clean and dry. These areas may be cleansed with mild soap and water. Do not cleanse the area with peroxide, alcohol, or iodine.  When you remove an adhesive bandage, be sure not to damage the skin around it.  If you have a wound, look at it several times a day to make sure it is healing.  Do not use heating pads or hot water bottles. They may burn your skin. If you have lost feeling in your feet or legs, you may not know it is happening until it is too late.  Make sure your health care provider performs a complete foot exam at least annually or more often if you have foot problems. Report any cuts, sores, or bruises to your health care provider immediately. SEEK MEDICAL CARE IF:   You have an injury that is not healing.  You have cuts or breaks in the skin.  You have an ingrown nail.  You notice redness on your legs or feet.  You feel burning or tingling in your legs or feet.  You have pain or cramps in your legs and feet.  Your legs or feet are numb.  Your feet always feel cold. SEEK IMMEDIATE MEDICAL CARE IF:   There is increasing redness,   swelling, or pain in or around a wound.  There is a red line that goes up your leg.  Pus is coming from a wound.  You develop a fever or as directed by your health care provider.  You notice a bad smell coming from an ulcer or wound. Document Released: 05/30/2000 Document Revised: 02/02/2013 Document Reviewed: 11/09/2012 Exit[employer] Patient Information 2014 [employer].  

## 2013-11-16 NOTE — Progress Notes (Signed)
   Subjective:    Patient ID: Elwin Sleight, female    DOB: Nov 11, 1955, 58 y.o.   MRN: [id number]  HPI Comments: N numbness L B/L 2nd distal toes D 5 weeks O  C numbness, similar to having a cover over the tip A diabetic  T pt's primary doctor referred to neurologist  Pt states 1 hour ago began having tingling in right 4th toe and MPJ.  Pt request information on diabetic shoes and pedicures.  Toe Pain   Foot Pain  Diabetes      Review of Systems  All other systems reviewed and are negative.      Objective:   Physical Exam Lower extremity objective findings as follows patient is a 58 year old white female well-developed well-nourished oriented x3 presents with a recent change in her neurologic status patient having some abnormal sensation numbness in her second toes and some tingling sensation ulcer which is something wrap around her toes rather than actual loss of sensation or burning or stinging. Okay objective findings follows vascular status is intact with pedal pulses palpable DP and PT +2/4 capillary refill time 3 seconds all digits epicritic and proprioceptive sensations intact on Semmes Weinstein testing to all toes spots on both feet intact sensations are noted however patient does have abnormal perception of sensation there are no open wounds ulcerations distal aspect second digits have some mild keratoses however her shoes are hitting the end of her toes and some of her shoes up this was addressed with the patient in the appropriate shoe sizing fitting in the future. Also recommend wearing socks with her shoes in the future as recommended. Orthopedic biomechanical exam rectus foot type mild flexible digital contractures noted no rigidity her major osseous abnormalities identified x-rays otherwise unremarkable and there is no pain on direct lateral compression of second or third interspace no symptoms of Morton's neuroma noted       Assessment & Plan:  Assessment  this time likely early diabetic neuropathy findings although not severe they are notable patient does indicate that recently for many years for A1c was well-managed in the 6 and most recently her blood sugars he climbed higher and there A1c is wearing the 7 range and most recent testing. However she is now working not bring get back under control also started her regimen of vitamins which I recommended a B complex and folic acid vitamins help with nerve health. Patient wearing appropriate shoes such as Birkenstocks discomfort in new balance RE maintain good shoe regimen and I feel she needs any special diabetic her shoes has a custom orthoses and insoles which fit and contour well forefoot advised to continue maintain appropriate shoe fitness. Did suggest socks with her shoes at all times including with her sandals can help prevent some of the keratosis patient discharge to an as-needed basis for followup she may still shoes to followup with neurologist for nerve testing her patient is advised nerve conduction studies will likely test large fiber and motor fibers and are not indicative are conclusive for diabetic neuropathy diabetic neuropathy findings would likely require a epidermal epidermal nerve fiber biopsy if she continues to have knee difficulties will followup in the future as needed  Harriet Masson DPM

## 2013-11-17 ENCOUNTER — Encounter: Payer: Self-pay | Admitting: *Deleted

## 2013-11-18 ENCOUNTER — Ambulatory Visit (INDEPENDENT_AMBULATORY_CARE_PROVIDER_SITE_OTHER): Payer: BC Managed Care – PPO | Admitting: Neurology

## 2013-11-18 ENCOUNTER — Encounter: Payer: Self-pay | Admitting: Neurology

## 2013-11-18 VITALS — BP 124/90 | HR 72 | Ht 65.35 in | Wt 246.3 lb

## 2013-11-18 DIAGNOSIS — R209 Unspecified disturbances of skin sensation: Secondary | ICD-10-CM

## 2013-11-18 DIAGNOSIS — E1149 Type 2 diabetes mellitus with other diabetic neurological complication: Secondary | ICD-10-CM

## 2013-11-18 LAB — VITAMIN B12: VITAMIN B 12: 385 pg/mL (ref 211–911)

## 2013-11-18 LAB — TSH: TSH: 1.497 u[IU]/mL (ref 0.350–4.500)

## 2013-11-18 NOTE — Progress Notes (Signed)
[facility] Clinic Note - Initial Visit   Date: 11/18/2013  Barbara Jackson MRN: [id number] DOB: [birth year]   Dear Dr Inda Merlin:   Thank you for your kind referral of Barbara Jackson for consultation of numbness of her toes. Although her history is well known to you, please allow [place] to reiterate it for the purpose of our medical record. The patient was accompanied to the clinic by self.    History of Present Illness: Barbara Jackson is a 58 y.o. right-handed Caucasian female with history of well-controlled diabetes mellitus (HbA1c ~6.4), hypertension, GERD, and anxiety/depression presenting for evaluation of numbness of the toes.   Around the end of April 2015, she developed numbness over the tips of her 2nd toe bilaterally. Symptoms have been constant and do not involve the other toes. Denies any exacerbating or alleviating factors.  She notices it more in the evening because she is not distracted by other things. She does not have associated pain or tingling or back pain.  She saw Dr. Blenda Mounts, podiatrist, yesterday whose clinic note was reviewed.  Based on the her clinical exam and negative plain films of the feet, there is no evidence of neuroma.     She is very anxious about her symptoms and has done a great deal of internet research.  Understandably, her primary concern is diabetic neuropathy, so she has since cut out all sweets from her diet.  No family history of neuromuscular disease.   Past Medical History  Diagnosis Date  . Anxiety   . Diabetes mellitus   . H/O hiatal hernia   . Arthritis   . Dysrhythmia     hx of atrial tachycardia and pacs  . Abnormal pap 1998    Negative Hpv  . Hypertension   . Hypercholesteremia   . Obesity     compulsive overeater  . Esophageal reflux   . Osteoarthritis     Past Surgical History  Procedure Laterality Date  . Total knee arthroplasty  01/12/2012    Procedure: TOTAL KNEE ARTHROPLASTY;  Surgeon:  Gearlean Alf, MD;  Location: [facility];  Service: Orthopedics;  Laterality: Left;  . Appendectomy  1975     Medications:  Current Outpatient Prescriptions on File Prior to Visit  Medication Sig Dispense Refill  . cetirizine (ZYRTEC) 10 MG tablet Take 10 mg by mouth daily.      . diphenhydrAMINE (BENADRYL ALLERGY) 25 mg capsule Take 25 mg by mouth every 6 (six) hours as needed.      . DULoxetine (CYMBALTA) 60 MG capsule Take 60 mg by mouth daily.      Marland Kitchen etodolac (LODINE) 400 MG tablet Take 400 mg by mouth 2 (two) times daily.      . fluticasone (FLONASE) 50 MCG/ACT nasal spray Place into both nostrils daily.      Marland Kitchen LORazepam (ATIVAN) 1 MG tablet Take 1 mg by mouth daily as needed for anxiety.      Marland Kitchen losartan-hydrochlorothiazide (HYZAAR) 100-25 MG per tablet Take 1 tablet by mouth every morning.      . metFORMIN (GLUCOPHAGE-XR) 500 MG 24 hr tablet Take 500 mg by mouth QID.       . Multiple Vitamins-Minerals (MULTIVITAMIN PO) Take by mouth.      Marland Kitchen omeprazole (PRILOSEC OTC) 20 MG tablet Take 20 mg by mouth daily.      . pioglitazone (ACTOS) 15 MG tablet Take 15 mg by mouth daily.      . potassium chloride (  K-DUR,KLOR-CON) 10 MEQ tablet Take 10 mEq by mouth daily.      . traMADol (ULTRAM) 50 MG tablet Take 50 mg by mouth as needed for pain.      Marland Kitchen zolpidem (AMBIEN) 10 MG tablet Take 10 mg by mouth at bedtime as needed. sleep       No current facility-administered medications on file prior to visit.    Allergies:  Allergies  Allergen Reactions  . Diclofenac Other (See Comments)    GAS  . Glucotrol [Glipizide]   . Lisinopril Cough  . Adhesive [Tape] Rash    Family History: Family History  Problem Relation Age of Onset  . Breast cancer Mother 76    Deceased, 54  . Heart attack Father     Deceased, 50  . COPD Father   . Obesity Sister   . Hypertension Brother   . Healthy Son     Social History: History   Social History  . Marital Status: Divorced    Spouse Name: N/A     Number of Children: 1  . Years of Education: N/A   Occupational History  .  [employer]   Social History Main Topics  . Smoking status: Never Smoker   . Smokeless tobacco: Never Used  . Alcohol Use: Yes     Comment: Once per week (wine)  . Drug Use: No  . Sexual Activity: Not on file   Other Topics Concern  . 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   Lives alone.     Highest level of education:  One year graduate school    Review of Systems:  CONSTITUTIONAL: No fevers, chills, night sweats, or weight loss.   EYES: No visual changes or eye pain ENT: No hearing changes.  No history of nose bleeds.   RESPIRATORY: No cough, wheezing and shortness of breath.   CARDIOVASCULAR: Negative for chest pain, and palpitations.   GI: Negative for abdominal discomfort, blood in stools or black stools.  No recent change in bowel habits.   GU:  No history of incontinence.   MUSCLOSKELETAL: No history of joint pain or swelling.  No myalgias.   SKIN: Negative for lesions, rash, and itching.   HEMATOLOGY/ONCOLOGY: Negative for prolonged bleeding, bruising easily, and swollen nodes.  No history of cancer.   ENDOCRINE: Negative for cold or heat intolerance, polydipsia or goiter.   PSYCH:  +depression or anxiety symptoms.   NEURO: As Above.   Vital Signs:  BP 124/90  Pulse 72  Ht 5' 5.35" (1.66 m)  Wt 246 lb 5 oz (111.727 kg)  BMI 40.55 kg/m2  SpO2 92%  General Medical Exam:   General:  Well appearing, comfortable.   Eyes/ENT: see cranial nerve examination.   Neck: No masses appreciated.  Full range of motion without tenderness.  No carotid bruits. Respiratory:  Clear to auscultation, good air entry bilaterally.   Cardiac:  Regular rate and rhythm, no murmur.    Extremities:  No deformities, edema, or skin discoloration. Good capillary refill.   Skin:  Skin color, texture, turgor normal. No rashes or lesions.  Neurological Exam: MENTAL STATUS  including orientation to time, place, person, recent and remote memory, attention span and concentration, language, and fund of knowledge is normal.  Speech is not dysarthric.  CRANIAL NERVES: II:  No visual field defects.  Unremarkable fundi.   III-IV-VI: Pupils equal round and reactive to light.  Normal conjugate,  extra-ocular eye movements in all directions of gaze.  No nystagmus.  No ptosis.   V:  Normal facial sensation.   VII:  Normal facial symmetry and movements.    VIII:  Normal hearing and vestibular function.   IX-X:  Normal palatal movement.   XI:  Normal shoulder shrug and head rotation.   XII:  Normal tongue strength and range of motion, no deviation or fasciculation.  MOTOR:  No atrophy, fasciculations or abnormal movements.  No pronator drift.  Tone is normal.    Right Upper Extremity:    Left Upper Extremity:    Deltoid  5/5   Deltoid  5/5   Biceps  5/5   Biceps  5/5   Triceps  5/5   Triceps  5/5   Wrist extensors  5/5   Wrist extensors  5/5   Wrist flexors  5/5   Wrist flexors  5/5   Finger extensors  5/5   Finger extensors  5/5   Finger flexors  5/5   Finger flexors  5/5   Dorsal interossei  5/5   Dorsal interossei  5/5   Abductor pollicis  5/5   Abductor pollicis  5/5   Tone (Ashworth scale)  0  Tone (Ashworth scale)  0   Right Lower Extremity:    Left Lower Extremity:    Hip flexors  5/5   Hip flexors  5/5   Hip extensors  5/5   Hip extensors  5/5   Knee flexors  5/5   Knee flexors  5/5   Knee extensors  5/5   Knee extensors  5/5   Dorsiflexors  5/5   Dorsiflexors  5/5   Plantarflexors  5/5   Plantarflexors  5/5   Toe extensors  5/5   Toe extensors  5/5   Toe flexors  5/5   Toe flexors  5/5   Tone (Ashworth scale)  0  Tone (Ashworth scale)  0   MSRs:  Right                                                                 Left brachioradialis 2+  brachioradialis 2+  biceps 2+  biceps 2+  triceps 2+  triceps 2+  patellar 2+  patellar 2+  ankle jerk 2+   ankle jerk 2+  Hoffman no  Hoffman no  plantar response down  plantar response down   SENSORY:  Subtle reduction in temperature over the right toes, normal over the dorsum of the feet.  Normal and symmetric perception of light touch, pinprick, and proprioception.  Romberg's sign absent.   COORDINATION/GAIT: Normal finger-to- nose-finger and heel-to-shin.  Intact rapid alternating movements bilaterally.  Able to rise from a chair without using arms.  Gait narrow based and stable. Tandem and stressed gait intact.    IMPRESSION: Barbara Jackson is a 58 year-old female presenting for evaluation of paresthesias involving the tip of her 2nd toe bilaterally.  Her exam is essentially normal, with only subtle reduction in temperature over the right toes.  Sensation in all other modalities is intact and reflexes are preserved throughout.  I explained that she may have signs of very early large fiber neuropathy, likely diabetic neuropathy, but for completeness will check for other treatable causes of neuropathy  also.  Symptoms are too mild to warrant NCS/EMG at this time, and can be considered if her symptoms worsen.  Other possibilities such a lumbosacral radiculopathy or deep peroneal neuropathy was discussed, but is less likely based on the lack of radicular pain and the distribution of her symptoms.   PLAN/RECOMMENDATIONS:  1.  Check TSH, vitamin B12, copper, ceruloplasmin, SPEP/UPEP with IFE 2.  Consider EMG if symptoms worsen 3.  Encouraged tight glycemic control and follow low sugar/carbohydrate diet 4.  Return to clinic in 6 months.   The duration of this appointment visit was 35 minutes of face-to-face time with the patient.  Greater than 50% of this time was spent in counseling, explanation of diagnosis, planning of further management, and coordination of care.   Thank you for allowing me to participate in patient's care.  If I can answer any additional questions, I would be pleased to do so.     Sincerely,    Deren Degrazia K. Posey Pronto, DO

## 2013-11-18 NOTE — Patient Instructions (Signed)
1.  Check blood work today 2.  If symptoms worsen or if you would like more definitve testing, please contact my office to schedule nerve conduction test/EMG 3.  Encouraged tight glycemic control and follow low sugar/carbohydrate diet 4.  Return to clinic in 54-months

## 2013-11-19 LAB — COPPER, SERUM: Copper: 110 ug/dL (ref 70–175)

## 2013-11-21 LAB — CERULOPLASMIN: Ceruloplasmin: 28 mg/dL (ref 18–53)

## 2013-11-22 LAB — UIFE/LIGHT CHAINS/TP QN, 24-HR UR
Albumin, U: DETECTED
Alpha 1, Urine: DETECTED — AB
Alpha 2, Urine: DETECTED — AB
Beta, Urine: DETECTED — AB
FREE KAPPA LT CHAINS, UR: 12.2 mg/dL — AB (ref 0.14–2.42)
FREE LAMBDA LT CHAINS, UR: 0.87 mg/dL — AB (ref 0.02–0.67)
Free Kappa/Lambda Ratio: 14.02 ratio — ABNORMAL HIGH (ref 2.04–10.37)
GAMMA UR: DETECTED — AB
Total Protein, Urine: 13.9 mg/dL

## 2013-11-22 LAB — SPEP & IFE WITH QIG
Albumin ELP: 59.9 % (ref 55.8–66.1)
Alpha-1-Globulin: 4 % (ref 2.9–4.9)
Alpha-2-Globulin: 9.5 % (ref 7.1–11.8)
BETA 2: 4.1 % (ref 3.2–6.5)
Beta Globulin: 6.9 % (ref 4.7–7.2)
GAMMA GLOBULIN: 15.6 % (ref 11.1–18.8)
IGA: 148 mg/dL (ref 69–380)
IGM, SERUM: 267 mg/dL (ref 52–322)
IgG (Immunoglobin G), Serum: 1000 mg/dL (ref 690–1700)
Total Protein, Serum Electrophoresis: 6.9 g/dL (ref 6.0–8.3)

## 2013-12-30 ENCOUNTER — Ambulatory Visit (INDEPENDENT_AMBULATORY_CARE_PROVIDER_SITE_OTHER): Payer: BC Managed Care – PPO | Admitting: Obstetrics & Gynecology

## 2013-12-30 ENCOUNTER — Encounter: Payer: Self-pay | Admitting: Obstetrics & Gynecology

## 2013-12-30 VITALS — BP 100/64 | HR 60 | Resp 16 | Ht 65.25 in | Wt 249.2 lb

## 2013-12-30 DIAGNOSIS — Z01419 Encounter for gynecological examination (general) (routine) without abnormal findings: Secondary | ICD-10-CM

## 2013-12-30 NOTE — Progress Notes (Signed)
58 y.o. G1P1001 DivorcedCaucasianF here for annual exam.  Son is in [place].  No vaginal bleeding.  KNOWS I want her to get a MMG.   Dr. Inda Merlin, PCP.  Pt does see her regularly.  Last HbA1C was elevated so another medication was added.     Patient's last menstrual period was 06/16/2005.          Sexually active: No.  The current method of family planning is post menopausal status.    Exercising: Yes.    swim, yoga, and walk Smoker:  no  Health Maintenance: Pap:  09/28/12 WNL History of abnormal Pap:  Yes h/o ASC-H 2011 MMG:  08/20/09-normal Colonoscopy:  5/10-repeat in 10 years BMD:   none TDaP:  04/08/06 Screening Labs: PCP, Hb today: PCP, Urine today: PCP   reports that she has never smoked. She has never used smokeless tobacco. She reports that she drinks about .5 - 1 ounces of alcohol per week. She reports that she does not use illicit drugs.  Past Medical History  Diagnosis Date  . Anxiety   . Diabetes mellitus   . H/O hiatal hernia   . Arthritis   . Dysrhythmia     hx of atrial tachycardia and pacs  . Abnormal pap 1998    Negative Hpv  . Hypertension   . Hypercholesteremia   . Obesity     compulsive overeater  . Esophageal reflux   . Osteoarthritis     Past Surgical History  Procedure Laterality Date  . 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 Appendectomy  1975    Current Outpatient Prescriptions  Medication Sig Dispense Refill  . cetirizine (ZYRTEC) 10 MG tablet Take 10 mg by mouth daily.      . diphenhydrAMINE (BENADRYL ALLERGY) 25 mg capsule Take 25 mg by mouth every 6 (six) hours as needed.      . DULoxetine (CYMBALTA) 60 MG capsule Take 60 mg by mouth daily.      . fluticasone (FLONASE) 50 MCG/ACT nasal spray Place into both nostrils daily.      Marland Kitchen LORazepam (ATIVAN) 1 MG tablet Take 1 mg by mouth daily as needed for anxiety.       Marland Kitchen losartan-hydrochlorothiazide (HYZAAR) 100-25 MG per tablet Take 1 tablet by mouth every morning.      . metFORMIN (GLUCOPHAGE-XR) 500 MG 24 hr tablet Take 500 mg by mouth QID.       . Multiple Vitamins-Minerals (MULTIVITAMIN PO) Take by mouth.      Marland Kitchen omeprazole (PRILOSEC OTC) 20 MG tablet Take 20 mg by mouth daily.      . pioglitazone (ACTOS) 15 MG tablet Take 15 mg by mouth daily.      . potassium chloride (K-DUR,KLOR-CON) 10 MEQ tablet Take 10 mEq by mouth daily.      . traMADol (ULTRAM) 50 MG tablet Take 50 mg by mouth as needed for pain.      Marland Kitchen zolpidem (AMBIEN) 10 MG tablet Take 10 mg by mouth at bedtime as needed. sleep       No current facility-administered medications for this visit.    Family History  Problem Relation Age of Onset  . Breast cancer Mother 47    Deceased, 75  . Heart attack Father     Deceased, 28  . COPD Father   . Obesity Sister   .  Hypertension Brother   . Healthy Son     ROS:  Pertinent items are noted in HPI.  Otherwise, a comprehensive ROS was negative.  Exam:   BP 100/64  Pulse 60  Resp 16  Ht 5' 5.25" (1.657 m)  Wt 249 lb 3.2 oz (113.036 kg)  BMI 41.17 kg/m2  LMP 06/16/2005  Weight change: +24#  Height: 5' 5.25" (165.7 cm)  Ht Readings from Last 3 Encounters:  12/30/13 5' 5.25" (1.657 m)  11/18/13 5' 5.35" (1.66 m)  11/16/13 5\' 5"  (1.651 m)    General appearance: alert, cooperative and appears stated age Head: Normocephalic, without obvious abnormality, atraumatic Neck: no adenopathy, supple, symmetrical, trachea midline and thyroid normal to inspection and palpation Lungs: clear to auscultation bilaterally Breasts: normal appearance, no masses or tenderness Heart: regular rate and rhythm Abdomen: soft, non-tender; bowel sounds normal; no masses,  no organomegaly Extremities: extremities normal, atraumatic, no cyanosis or edema Skin: Skin color, texture, turgor normal. No rashes or lesions Lymph nodes: Cervical, supraclavicular, and  axillary nodes normal. No abnormal inguinal nodes palpated Neurologic: Grossly normal   Pelvic: External genitalia:  no lesions              Urethra:  normal appearing urethra with no masses, tenderness or lesions              Bartholins and Skenes: normal                 Vagina: normal appearing vagina with normal color and discharge, no lesions              Cervix: no lesions              Pap taken: No. Bimanual Exam:  Uterus:  normal size, contour, position, consistency, mobility, non-tender              Adnexa: normal adnexa and no mass, fullness, tenderness               Rectovaginal: Confirms               Anus:  normal sphincter tone, no lesions  A:  Well Woman with normal exam  PMP, no HRT  H/O abnormal Paps with neg HR HPV 2011. Not SA.  Hypertension DM  P: Mammogram encouraged. Patient declines having me schedule this for her. Number [facility] to [facility]. 3D MMG discussed.  No Pap today.  Pap 2014 WNL.  Labs with Dr. Inda Merlin every six months.  return annually or prn   An After Visit Summary was printed and given to the patient.

## 2014-02-23 ENCOUNTER — Encounter: Payer: Self-pay | Admitting: Obstetrics & Gynecology

## 2014-02-24 NOTE — Telephone Encounter (Signed)
I opened up an IT ticket and spoke with Barbara Jackson in IT. She only sees the two appointment reminders in EPIC: 12/30/13 at 10:30 AM and next year 01/05/15 at 10:45 AM. Barbara Jackson does not show any data to reflect what the patient saw in her account as far as the wrong date for her appointment this year. The patient's MyChart account is correct on our end.

## 2014-03-31 ENCOUNTER — Other Ambulatory Visit: Payer: Self-pay

## 2014-04-17 ENCOUNTER — Encounter: Payer: Self-pay | Admitting: Obstetrics & Gynecology

## 2014-08-31 ENCOUNTER — Telehealth: Payer: Self-pay | Admitting: Neurology

## 2014-08-31 NOTE — Telephone Encounter (Signed)
Pt has scheduled a follow up appt but says Dr. Posey Pronto mentioned having a NCV appt in the future. Does she need to plan on getting this done prior to her follow up. Pt CB# [phone] / Venida Jarvis

## 2014-09-01 ENCOUNTER — Other Ambulatory Visit: Payer: Self-pay | Admitting: *Deleted

## 2014-09-01 DIAGNOSIS — M79605 Pain in left leg: Principal | ICD-10-CM

## 2014-09-01 DIAGNOSIS — M79604 Pain in right leg: Secondary | ICD-10-CM

## 2014-09-01 NOTE — Telephone Encounter (Signed)
I called patient back since your last note says to do EMG if symptoms get worse.  Patient says that her toes are still numb and she is having a stinging sensation in her thigh.  She saw her PCP and he recommended for her to follow up with you.  Do you want me to schedule 90 minute EMG of the legs?  Please advise.  Thanks.

## 2014-09-01 NOTE — Telephone Encounter (Signed)
Recommend EMG of the legs and f/u visit.

## 2014-09-01 NOTE — Telephone Encounter (Signed)
Pt is sch for 09-18-14 for the EMG

## 2014-09-01 NOTE — Telephone Encounter (Signed)
Will you please call her to schedule EMG?  90 minutes.

## 2014-09-14 ENCOUNTER — Ambulatory Visit: Payer: BC Managed Care – PPO | Admitting: Neurology

## 2014-09-18 ENCOUNTER — Ambulatory Visit (INDEPENDENT_AMBULATORY_CARE_PROVIDER_SITE_OTHER): Payer: BC Managed Care – PPO | Admitting: Neurology

## 2014-09-18 DIAGNOSIS — M79604 Pain in right leg: Secondary | ICD-10-CM | POA: Diagnosis not present

## 2014-09-18 DIAGNOSIS — M5417 Radiculopathy, lumbosacral region: Secondary | ICD-10-CM

## 2014-09-18 DIAGNOSIS — M79605 Pain in left leg: Secondary | ICD-10-CM

## 2014-09-18 NOTE — Procedures (Signed)
[facility] Neurology  [place], [place]  [place], [place] [address] Tel: [phone] Fax:  [phone] Test Date:  09/18/2014  Patient: Barbara Jackson DOB: 1955-09-04 Physician: Narda Amber, DO  Sex: Female Height: 5\' 5"  Ref Phys: Narda Amber  ID#: [id number] Temp: 33.0C Technician: Laureen Ochs R. NCS T.   Patient Complaints: Patient is a 59 year old female here for evaluation of her feet due to numBness and tingling in her legs right worse than left.  NCV & EMG Findings: Extensive electrodiagnostic testing of the right lower extremity and additional studies of the left is somewhat hampered by body physiognomy. Findings are as follows: 1. Bilateral sural and superficial peroneal sensory responses are within normal limits. 2. Right tibial motor response is reduced and may be technical in nature. Left peroneal motor response is reduced at the extensor digitorum brevis, however it is within normal limits at the tibialis anterior. Right peroneal and the left tibial motor responses are within normal limits. 3. Chronic motor axon loss changes are seen affecting bilateral L2-L4 myotomes, without accompanied active denervation.  Impression: 1. 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 2. There is no evidence of a generalized sensorimotor polyneuropathy affecting the lower extremities.    ___________________________ Narda Amber, DO    Nerve Conduction Studies Anti Sensory Summary Table   Site NR Peak (ms) Norm Peak (ms) P-T Amp (V) Norm P-T Amp  Left Sup Peroneal Anti Sensory (Ant Lat Mall)  33C  12 cm    2.7 <4.6 8.8 >4  Right Sup Peroneal Anti Sensory (Ant Lat Mall)  33C  12 cm    2.6 <4.6 8.6 >4  Left Sural Anti Sensory (Lat Mall)  33C  Calf    3.4 <4.6 6.5 >4  Right Sural Anti Sensory (Lat Mall)  33C  Calf    3.6 <4.6 6.8 >4   Motor Summary Table   Site NR Onset (ms) Norm Onset (ms) O-P Amp (mV) Norm  O-P Amp Site1 Site2 Delta-0 (ms) Dist (cm) Vel (m/s) Norm Vel (m/s)  Left Peroneal Motor (Ext Dig Brev)  33C  Ankle    3.0 <6.0 2.1 >2.5 B Fib Ankle 8.5 37.0 44 >40  B Fib    11.5  1.7  Poplt B Fib 1.8 10.0 56 >40  Poplt    13.3  1.7         Right Peroneal Motor (Ext Dig Brev)  33C  Ankle    2.7 <6.0 2.6 >2.5 B Fib Ankle 8.4 33.5 40 >40  B Fib    11.1  1.8  Poplt B Fib 1.4 8.0 57 >40  Poplt    12.5  1.8         Left Peroneal TA Motor (Tib Ant)  33C  Fib Head    3.4 <4.5 4.0 >3 Poplit Fib Head 1.9 10.0 53 >40  Poplit    5.3  4.0         Right Peroneal TA Motor (Tib Ant)  33C  Fib Head    2.8 <4.5 4.7 >3 Poplit Fib Head 1.4 9.0 64 >40  Poplit    4.2  4.0         Left Tibial Motor (Abd Hall Brev)  33C    body habitus behind knee  Ankle    3.6 <6.0 7.0 >4 Knee Ankle 9.9 45.0 45 >40  Knee    13.5  3.1  Right Tibial Motor (Abd Hall Brev)  33C    Multiple attempts  Ankle    2.3 <6.0 3.6 >4 Knee Ankle 10.4 42.0 40 >40  Knee    12.7  0.9          EMG   Side Muscle Ins Act Fibs Psw Fasc Number Recrt Dur Dur. Amp Amp. Poly Poly. Comment  Left AntTibialis Nml Nml Nml Nml Nml Nml Nml Nml Nml Nml Nml Nml N/A  Left Gastroc Nml Nml Nml Nml Nml Nml Nml Nml Nml Nml Nml Nml N/A  Left Flex Dig Long Nml Nml Nml Nml Nml Nml Nml Nml Nml Nml Nml Nml N/A  Left RectFemoris Nml Nml Nml Nml 1- Mod-R Some 1+ Some 1+ Nml Nml N/A  Left GluteusMed Nml Nml Nml Nml Nml Nml Nml Nml Nml Nml Nml Nml N/A  Right AntTibialis Nml Nml Nml Nml Nml Nml Nml Nml Nml Nml Nml Nml N/A  Right Gastroc Nml Nml Nml Nml Nml Nml Nml Nml Nml Nml Nml Nml N/A  Right Flex Dig Long Nml Nml Nml Nml Nml Nml Nml Nml Nml Nml Nml Nml N/A  Right RectFemoris Nml Nml Nml Nml 1- Mod-R Some 1+ Some 1+ Nml Nml N/A  Right GluteusMed Nml Nml Nml Nml Nml Nml Nml Nml Nml Nml Nml Nml N/A  Right AdductorLong Nml Nml Nml Nml 1- Mod-R Some 1+ Some 1+ Nml Nml N/A      Waveforms:

## 2014-09-19 ENCOUNTER — Telehealth: Payer: Self-pay | Admitting: Neurology

## 2014-09-19 NOTE — Telephone Encounter (Signed)
Patient notified of her results of her EMG

## 2014-09-19 NOTE — Telephone Encounter (Signed)
Pt called stating that she was returning a call regarding her results.  C/b [id number]

## 2014-09-29 ENCOUNTER — Ambulatory Visit: Payer: BC Managed Care – PPO | Admitting: Neurology

## 2014-12-11 ENCOUNTER — Other Ambulatory Visit: Payer: Self-pay

## 2014-12-21 ENCOUNTER — Ambulatory Visit (INDEPENDENT_AMBULATORY_CARE_PROVIDER_SITE_OTHER): Payer: BC Managed Care – PPO | Admitting: Neurology

## 2014-12-21 ENCOUNTER — Encounter: Payer: Self-pay | Admitting: Neurology

## 2014-12-21 VITALS — BP 140/80 | HR 84 | Resp 20 | Ht 63.0 in | Wt 280.5 lb

## 2014-12-21 DIAGNOSIS — G5711 Meralgia paresthetica, right lower limb: Secondary | ICD-10-CM | POA: Diagnosis not present

## 2014-12-21 DIAGNOSIS — R202 Paresthesia of skin: Secondary | ICD-10-CM | POA: Diagnosis not present

## 2014-12-21 DIAGNOSIS — M5417 Radiculopathy, lumbosacral region: Secondary | ICD-10-CM

## 2014-12-21 MED ORDER — LIDOCAINE 5 % EX OINT: TOPICAL_OINTMENT | CUTANEOUS | Status: DC

## 2014-12-21 NOTE — Progress Notes (Signed)
Follow-up Visit   Date: 12/21/2014    [name] MRN: [id number] DOB: February 22, 1956   Interim History: Barbara Jackson is a 59 y.o. right-handed Caucasian female with well controlled diabetes mellitus (HbA1c ~6.4), hypertension, GERD, and anxiety/depression returning to the clinic for follow-up of numbness of the toes and meralgia paresthetica.  The patient was accompanied to the clinic by self.  History of present illness: Initial visit 11/18/2013:  Around the end of April 2015, she developed numbness over the tips of her 2nd toe bilaterally. Symptoms have been constant and do not involve the other toes. Denies any exacerbating or alleviating factors. She notices it more in the evening because she is not distracted by other things. She does not have associated pain or tingling or back pain. She saw Dr. Blenda Mounts, podiatrist, whose clinic note was reviewed. Based on the her clinical exam and negative plain films of the feet, there is no evidence of neuroma.   She is very anxious about her symptoms and has done a great deal of internet research. Understandably, her primary concern is diabetic neuropathy, so she has since cut out all sweets from her diet.  No family history of neuromuscular disease.  UPDATE 12/21/2014:  She is here to discus her EMG results which showed mild L2-4 radiculopathy affecting the legs (asymptomatic).  No evidence of neuropathy.  She denies any radicular pain or low back pain.  She continues to have paresthesias of the tips of her toes and lateral thigh.  She is eager and motivated towards weight loss goals, but feels that she is addicted to food.  She is seeing a therapist and nutritionalist.    Medications:  Current Outpatient Prescriptions on File Prior to Visit  Medication Sig Dispense Refill  . amoxicillin (AMOXIL) 500 MG capsule   3  . atorvastatin (LIPITOR) 40 MG tablet Take 40 mg by mouth daily.  0  . cetirizine (ZYRTEC) 10 MG tablet Take 10 mg  by mouth daily.    . diphenhydrAMINE (BENADRYL ALLERGY) 25 mg capsule Take 25 mg by mouth every 6 (six) hours as needed.    . DULoxetine (CYMBALTA) 60 MG capsule Take 60 mg by mouth daily.    Marland Kitchen etodolac (LODINE) 400 MG tablet   1  . fluticasone (FLONASE) 50 MCG/ACT nasal spray Place into both nostrils daily.    Marland Kitchen FREESTYLE TEST STRIPS test strip   5  . LORazepam (ATIVAN) 1 MG tablet Take 1 mg by mouth daily as needed for anxiety.    Marland Kitchen losartan-hydrochlorothiazide (HYZAAR) 100-25 MG per tablet Take 1 tablet by mouth every morning.    . metFORMIN (GLUCOPHAGE-XR) 500 MG 24 hr tablet Take 500 mg by mouth QID.     . Multiple Vitamins-Minerals (MULTIVITAMIN PO) Take by mouth.    Marland Kitchen omeprazole (PRILOSEC OTC) 20 MG tablet Take 20 mg by mouth daily.    . potassium chloride (K-DUR,KLOR-CON) 10 MEQ tablet Take 10 mEq by mouth daily.    . traMADol (ULTRAM) 50 MG tablet Take 50 mg by mouth as needed for pain.    Marland Kitchen zolpidem (AMBIEN) 10 MG tablet Take 10 mg by mouth at bedtime as needed. sleep    . glipiZIDE (GLUCOTROL XL) 5 MG 24 hr tablet   5  . pioglitazone (ACTOS) 15 MG tablet Take 15 mg by mouth daily.     No current facility-administered medications on file prior to visit.    Allergies:  Allergies  Allergen Reactions  . Diclofenac  Other (See Comments)    GAS  . Glucotrol [Glipizide]   . Lisinopril Cough  . Adhesive [Tape] Rash    Review of Systems:  CONSTITUTIONAL: No fevers, chills, night sweats, or weight loss.  EYES: No visual changes or eye pain ENT: No hearing changes.  No history of nose bleeds.   RESPIRATORY: No cough, wheezing and shortness of breath.   CARDIOVASCULAR: Negative for chest pain, and palpitations.   GI: Negative for abdominal discomfort, blood in stools or black stools.  No recent change in bowel habits.   GU:  No history of incontinence.   MUSCLOSKELETAL: No history of joint pain or swelling.  No myalgias.   SKIN: Negative for lesions, rash, and itching.     ENDOCRINE: Negative for cold or heat intolerance, polydipsia or goiter.   PSYCH:  + depression or anxiety symptoms.   NEURO: As Above.   Vital Signs:  BP 140/80 mmHg  Pulse 84  Resp 20  Ht 5\' 3"  (1.6 m)  Wt 280 lb 8 oz (127.234 kg)  BMI 49.70 kg/m2  SpO2 98%  LMP 06/16/2005  Neurological Exam: MENTAL STATUS including orientation to time, place, person, recent and remote memory, attention span and concentration, language, and fund of knowledge is normal.  Speech is not dysarthric.  CRANIAL NERVES: Pupils equal round and reactive to light.  Normal conjugate, extra-ocular eye movements in all directions of gaze.  No ptosis.  Face is symmetric. Palate elevates symmetrically.  Tongue is midline.  MOTOR:  Motor strength is 5/5 in all extremities.  No pronator drift.  Tone is normal.    MSRs:  Reflexes are 2+/4 throughout.  SENSORY:  Intact to to temperature and vibration.  COORDINATION/GAIT:  Gait wide-based due to body habitus  Data: EMG 09/18/2014 1. 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 2. There is no evidence of a generalized sensorimotor polyneuropathy affecting the lower extremities.  Labs 11/18/2013:  Vitamin B12 385, copper 110, ceruloplasmin 28, TSH 1.497, SPEP/UPEP with IFE no M protein   IMPRESSION/PLAN: 1.  Meralgia paresthetica  - Encouraged weight loss and discussed strategies at length  - Start lidocaine ointment to thigh BID  - If worsens, consider gabapentin 2.  Early and distal neuropathy is likely, especially with mild symptoms despite EMG being normal   - She is doing well with tight glycemic control 3.  Morbid obesity  - healthy life style changes discussed, patient is very motivated but not seeing results she desires  - she is seeing therapist and nutritionalist  Return to clinic as needed   The duration of this appointment visit was 25 minutes of face-to-face time with the patient.  Greater than  50% of this time was spent in counseling, explanation of diagnosis, planning of further management, and coordination of care.   Thank you for allowing me to participate in patient's care.  If I can answer any additional questions, I would be pleased to do so.    Sincerely,    Paulino Cork K. Posey Pronto, DO

## 2014-12-21 NOTE — Patient Instructions (Signed)
1.  Start lidocaine ointment to thighs twice daily as needed 2.  You have a great attitude - keep it up and you will achieve your weight loss goals 3.  Return to clinic if symptoms worsen

## 2015-01-05 ENCOUNTER — Ambulatory Visit: Payer: BC Managed Care – PPO | Admitting: Obstetrics & Gynecology

## 2015-01-22 ENCOUNTER — Ambulatory Visit: Payer: BC Managed Care – PPO | Admitting: Neurology

## 2015-02-02 ENCOUNTER — Ambulatory Visit (INDEPENDENT_AMBULATORY_CARE_PROVIDER_SITE_OTHER): Payer: BC Managed Care – PPO | Admitting: Obstetrics and Gynecology

## 2015-02-02 ENCOUNTER — Encounter: Payer: Self-pay | Admitting: Obstetrics and Gynecology

## 2015-02-02 VITALS — BP 122/74 | HR 80 | Resp 16 | Ht 65.0 in | Wt 280.2 lb

## 2015-02-02 DIAGNOSIS — N3946 Mixed incontinence: Secondary | ICD-10-CM | POA: Diagnosis not present

## 2015-02-02 DIAGNOSIS — Z803 Family history of malignant neoplasm of breast: Secondary | ICD-10-CM

## 2015-02-02 DIAGNOSIS — Z01419 Encounter for gynecological examination (general) (routine) without abnormal findings: Secondary | ICD-10-CM | POA: Diagnosis not present

## 2015-02-02 DIAGNOSIS — Z0000 Encounter for general adult medical examination without abnormal findings: Secondary | ICD-10-CM | POA: Diagnosis not present

## 2015-02-02 NOTE — Progress Notes (Signed)
Patient ID: Barbara Jackson, female   DOB: Mar 25, 1956, 59 y.o.   MRN: [id number] 59 y.o. G15P1001 Divorced Caucasian female here for annual exam.    Has urinary incontinence.  Wears pads. Having leak with sneeze or a cough.  But leaks for no reason at all.  No urgency or frequency.  Saw urology.  Did physical therapy.  Did not help a lot.   Last HgbA1C 7.6.  FH of breast cancer.   Works for [employer].  Chartered certified accountant.   PCP:  Darcus Austin, MD   Patient's last menstrual period was 06/16/2005.          Sexually active: No. female The current method of family planning is post menopausal status.    Exercising: Yes.    walking, swimming and recumbent bike. Smoker:  no  Health Maintenance: Pap:  09-28-12 Neg History of abnormal Pap:  Yes, Hx [facility] 2011 MMG:  08-20-09 normal:[facility]:  10/2008 normal with Dr. Collene Mares.  Next due 10/2018. BMD:   n/a  Result  n/a TDaP:  04-08-06 Screening Labs:  Hb today: PCP, Urine today: Neg   reports that she has never smoked. She has never used smokeless tobacco. She reports that she drinks about 1.2 oz of alcohol per week. She reports that she does not use illicit drugs.  Past Medical History  Diagnosis Date  . Anxiety   . Diabetes mellitus   . H/O hiatal hernia   . Arthritis   . Dysrhythmia     hx of atrial tachycardia and pacs  . Abnormal pap 1998    Negative Hpv  . Hypertension   . Hypercholesteremia   . Obesity     compulsive overeater  . Esophageal reflux   . Osteoarthritis     Past Surgical History  Procedure Laterality Date  . 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 Appendectomy  1975    Current Outpatient Prescriptions  Medication Sig Dispense Refill  . amoxicillin (AMOXIL) 500 MG capsule   3  . atorvastatin (LIPITOR) 40 MG tablet Take 40 mg by mouth daily.  0  . cetirizine (ZYRTEC) 10 MG  tablet Take 10 mg by mouth daily.    . diphenhydrAMINE (BENADRYL ALLERGY) 25 mg capsule Take 25 mg by mouth every 6 (six) hours as needed.    . DULoxetine (CYMBALTA) 60 MG capsule Take 60 mg by mouth daily.    Marland Kitchen etodolac (LODINE) 400 MG tablet   1  . fluticasone (FLONASE) 50 MCG/ACT nasal spray Place into both nostrils daily.    Marland Kitchen FREESTYLE TEST STRIPS test strip   5  . glipiZIDE (GLUCOTROL XL) 10 MG 24 hr tablet Take 10 mg by mouth daily.  1  . lidocaine (XYLOCAINE) 5 % ointment Apply to thighs twice daily as needed. 35.44 g 0  . LORazepam (ATIVAN) 1 MG tablet Take 1 mg by mouth daily as needed for anxiety.    Marland Kitchen losartan-hydrochlorothiazide (HYZAAR) 100-25 MG per tablet Take 1 tablet by mouth every morning.    . metFORMIN (GLUCOPHAGE-XR) 500 MG 24 hr tablet Take 500 mg by mouth QID.     . Multiple Vitamins-Minerals (MULTIVITAMIN PO) Take by mouth.    Marland Kitchen omeprazole (PRILOSEC OTC) 20 MG tablet Take 20 mg by mouth daily.    . pioglitazone (ACTOS) 30 MG tablet Take 30 mg by mouth daily.  1  .  potassium chloride (K-DUR,KLOR-CON) 10 MEQ tablet Take 10 mEq by mouth daily.    . traMADol (ULTRAM) 50 MG tablet Take 50 mg by mouth as needed for pain.    Marland Kitchen zolpidem (AMBIEN) 10 MG tablet Take 10 mg by mouth at bedtime as needed. sleep     No current facility-administered medications for this visit.    Family History  Problem Relation Age of Onset  . Breast cancer Mother 52    Deceased, 56  . Heart attack Father     Deceased, 26  . COPD Father   . Obesity Sister   . Hypertension Brother   . Healthy Son     ROS:  Pertinent items are noted in HPI.  Otherwise, a comprehensive ROS was negative.  Exam:   BP 122/74 mmHg  Pulse 80  Resp 16  Ht 5\' 5"  (1.651 m)  Wt 280 lb 3.2 oz (127.098 kg)  BMI 46.63 kg/m2  LMP 06/16/2005    General appearance: alert, cooperative and appears stated age Head: Normocephalic, without obvious abnormality, atraumatic Neck: no adenopathy, supple, symmetrical,  trachea midline and thyroid normal to inspection and palpation Lungs: clear to auscultation bilaterally Breasts: normal appearance, no masses or tenderness, Inspection negative, No nipple retraction or dimpling, No nipple discharge or bleeding, No axillary or supraclavicular adenopathy Heart: regular rate and rhythm Abdomen: soft, non-tender; bowel sounds normal; no masses,  no organomegaly Extremities: extremities normal, atraumatic, no cyanosis or edema Skin: Skin color, texture, turgor normal. No rashes or lesions Lymph nodes: Cervical, supraclavicular, and axillary nodes normal. No abnormal inguinal nodes palpated Neurologic: Grossly normal  Pelvic: External genitalia:  no lesions              Urethra:  normal appearing urethra with no masses, tenderness or lesions              Bartholins and Skenes: normal                 Vagina: normal appearing vagina with normal color and discharge, no lesions              Cervix: no lesions              Pap taken: Yes.   Bimanual Exam:  Uterus:  normal size, contour, position, consistency, mobility, non-tender.  Exam limited by [facility].              Adnexa: normal adnexa and no mass, fullness, tenderness              Rectovaginal: Yes.  .  Confirms.              Anus:  normal sphincter tone, no lesions  Chaperone was present for exam.  Assessment:   Well woman visit with normal exam. FH of breast cancer in her mother. Mixed incontinence.   Plan: Yearly mammogram recommended after age 2.  Patient declines.   Offered genetic counseling and testing for familial cancers.  Declined. Recommended self breast exam.  Pap and HR HPV as above. Discussed Calcium, Vitamin D, regular exercise program including cardiovascular and weight bearing exercise. Labs performed.  No..   See orders. Refills given on medications.  No..  See orders. Discussed mixed incontinence.  ACOG handout.  Discussed bladder irritants.  Discussed OTC Oxytrol patches.  Follow up  annually and prn.      After visit summary provided.

## 2015-02-02 NOTE — Patient Instructions (Signed)
EXERCISE AND DIET:  We recommended that you start or continue a regular exercise program for good health. Regular exercise means any activity that makes your heart beat faster and makes you sweat.  We recommend exercising at least 30 minutes per day at least 3 days a week, preferably 4 or 5.  We also recommend a diet low in fat and sugar.  Inactivity, poor dietary choices and obesity can cause diabetes, heart attack, stroke, and kidney damage, among others.    ALCOHOL AND SMOKING:  Women should limit their alcohol intake to no more than 7 drinks/beers/glasses of wine (combined, not each!) per week. Moderation of alcohol intake to this level decreases your risk of breast cancer and liver damage. And of course, no recreational drugs are part of a healthy lifestyle.  And absolutely no smoking or even second hand smoke. Most people know smoking can cause heart and lung diseases, but did you know it also contributes to weakening of your bones? Aging of your skin?  Yellowing of your teeth and nails?  CALCIUM AND VITAMIN D:  Adequate intake of calcium and Vitamin D are recommended.  The recommendations for exact amounts of these supplements seem to change often, but generally speaking 600 mg of calcium (either carbonate or citrate) and 800 units of Vitamin D per day seems prudent. Certain women may benefit from higher intake of Vitamin D.  If you are among these women, your doctor will have told you during your visit.    PAP SMEARS:  Pap smears, to check for cervical cancer or precancers,  have traditionally been done yearly, although recent scientific advances have shown that most women can have pap smears less often.  However, every woman still should have a physical exam from her gynecologist every year. It will include a breast check, inspection of the vulva and vagina to check for abnormal growths or skin changes, a visual exam of the cervix, and then an exam to evaluate the size and shape of the uterus and  ovaries.  And after 59 years of age, a rectal exam is indicated to check for rectal cancers. We will also provide age appropriate advice regarding health maintenance, like when you should have certain vaccines, screening for sexually transmitted diseases, bone density testing, colonoscopy, mammograms, etc.   MAMMOGRAMS:  All women over 40 years old should have a yearly mammogram. Many facilities now offer a "3D" mammogram, which may cost around $50 extra out of pocket. If possible,  we recommend you accept the option to have the 3D mammogram performed.  It both reduces the number of women who will be called back for extra views which then turn out to be normal, and it is better than the routine mammogram at detecting truly abnormal areas.    COLONOSCOPY:  Colonoscopy to screen for colon cancer is recommended for all women at age 50.  We know, you hate the idea of the prep.  We agree, BUT, having colon cancer and not knowing it is worse!!  Colon cancer so often starts as a polyp that can be seen and removed at colonscopy, which can quite literally save your life!  And if your first colonoscopy is normal and you have no family history of colon cancer, most women don't have to have it again for 10 years.  Once every ten years, you can do something that may end up saving your life, right?  We will be happy to help you get it scheduled when you are ready.    Be sure to check your insurance coverage so you understand how much it will cost.  It may be covered as a preventative service at no cost, but you should check your particular policy.     Oxybutynin skin patch What is this medicine? OXYBUTYNIN (ox i BYOO ti nin) is used to treat overactive bladder. This medicine reduces the amount of bathroom visits. It may also help to control wetting accidents. This medicine may be used for other purposes; ask your health care provider or pharmacist if you have questions. COMMON BRAND NAME(S): Oxytrol, Oxytrol for  Women What should I tell my health care provider before I take this medicine? They need to know if you have any of these conditions: -autonomic neuropathy -dementia -difficulty passing urine -glaucoma -intestinal obstruction -kidney disease -liver disease -myasthenia gravis -Parkinson's disease -an unusual or allergic reaction to oxybutynin, other medicines, foods, dyes, or preservatives -pregnant or trying to get pregnant -breast-feeding How should I use this medicine? This medicine is for use on the skin. Follow the directions on the prescription label. Find an area of skin on your abdomen, hip, or backside that is clean, dry, greaseless, undamaged and hairless. Remove the patch from the sealed pouch. Do not cut or trim the patch. Using your palm, press the patch firmly in place to make sure that there is good contact with your skin. Change the patch two times per week, keeping to a regular schedule. When you apply a new patch, use a new area of skin. Wait at least 1 week before using the same area again. Talk to your pediatrician regarding the use of this medicine in children. Special care may be needed. Overdosage: If you think you have taken too much of this medicine contact a poison control center or emergency room at once. NOTE: This medicine is only for you. Do not share this medicine with others. What if I miss a dose? If you forget to replace a patch, use it as soon as you can. Only use one patch at a time and do not leave on the skin for longer than directed. If a patch falls off, you can replace it, but keep to your schedule and remove the patch at the right time. What may interact with this medicine? -antihistamines for allergy, cough and cold -atropine -certain medicines for bladder problems like oxybutynin, tolterodine -certain medicines for Parkinson's disease like benztropine, trihexyphenidyl -certain medicines for stomach problems like dicyclomine, hyoscyamine -certain  medicines for travel sickness like scopolamine -clarithromycin -erythromycin -ipratropium -medicines for fungal infections, like fluconazole, itraconazole, ketoconazole or voriconazole This list may not describe all possible interactions. Give your health care provider a list of all the medicines, herbs, non-prescription drugs, or dietary supplements you use. Also tell them if you smoke, drink alcohol, or use illegal drugs. Some items may interact with your medicine. What should I watch for while using this medicine? It may take a few weeks to notice the full benefit from this medicine. You may need to limit your intake tea, coffee, caffeinated sodas, and alcohol. These drinks may make your symptoms worse. You may get drowsy or dizzy. Do not drive, use machinery, or do anything that needs mental alertness until you know how this medicine affects you. Do not stand or sit up quickly, especially if you are an older patient. This reduces the risk of dizzy or fainting spells. Alcohol may interfere with the effect of this medicine. Avoid alcoholic drinks. Your mouth may get dry. Chewing sugarless gum or  sucking hard candy, and drinking plenty of water may help. Contact your doctor if the problem does not go away or is severe. This medicine may cause dry eyes and blurred vision. If you wear contact lenses, you may feel some discomfort. Lubricating drops may help. See your eyecare professional if the problem does not go away or is severe. Avoid extreme heat. This medicine can cause you to sweat less than normal. Your body temperature could increase to dangerous levels, which may lead to heat stroke. Do not expose the patch to sunlight. You should wear it under your clothes. You can keep the patch in place during swimming, bathing, and showering. If your patch falls off during these activities, replace it. What side effects may I notice from receiving this medicine? Side effects that you should report to your  doctor or health care professional as soon as possible: -allergic reactions like skin rash, itching or hives, swelling of the face, lips, or tongue -agitation -breathing problems -confusion -fever -flushing (reddening of the skin) -hallucinations -memory loss -pain or difficulty passing urine -palpitations -unusually weak or tired Side effects that usually do not require medical attention (report to your doctor or health care professional if they continue or are bothersome): -constipation -headache -sexual difficulties (impotence) This list may not describe all possible side effects. Call your doctor for medical advice about side effects. You may report side effects to FDA at 1-800-FDA-1088. Where should I keep my medicine? Keep out of the reach of children. Store at room temperature between 15 and 30 degrees C (59 and 86 degrees F). Protect from moisture and humidity. Do not remove from the package until you are ready to use. Protect from light. When you remove a patch, fold it in half with sticky sides together and throw away. Throw away unused medicine after the expiration date. NOTE: This sheet is a summary. It may not cover all possible information. If you have questions about this medicine, talk to your doctor, pharmacist, or health care provider.  2015, Elsevier/Gold Standard. (2013-08-18 10:57:07)

## 2015-02-06 LAB — IPS PAP TEST WITH HPV

## 2016-05-16 ENCOUNTER — Encounter: Payer: Self-pay | Admitting: Obstetrics & Gynecology

## 2016-05-16 ENCOUNTER — Other Ambulatory Visit: Payer: Self-pay

## 2016-05-16 ENCOUNTER — Ambulatory Visit (INDEPENDENT_AMBULATORY_CARE_PROVIDER_SITE_OTHER): Payer: BC Managed Care – PPO | Admitting: Obstetrics & Gynecology

## 2016-05-16 VITALS — BP 118/70 | HR 72 | Resp 14 | Ht 65.0 in | Wt 267.0 lb

## 2016-05-16 DIAGNOSIS — Z01419 Encounter for gynecological examination (general) (routine) without abnormal findings: Secondary | ICD-10-CM | POA: Diagnosis not present

## 2016-05-16 MED ORDER — OXYBUTYNIN 3.9 MG/24HR TD PTTW: 1.0000 | MEDICATED_PATCH | TRANSDERMAL | 12 refills | Status: DC

## 2016-05-16 NOTE — Patient Instructions (Signed)
Double check about the Hep C test.  If not done, I'm happy to do it.

## 2016-05-16 NOTE — Progress Notes (Signed)
60 y.o. G2P1001 Divorced Caucasian F here for annual exam.  Doing well.  Very frustrated with weight.    Denies vaginal bleeding.    Patient's last menstrual period was 06/16/2005.          Sexually active: No.  The current method of family planning is post menopausal status.    Exercising: Yes.    recummbent bike, swimming  Smoker:  no  Health Maintenance: Pap:  02/02/15 negative, HR HPV negative  History of abnormal Pap:  yes MMG:  03/20/16 ultrasound: BIRADS 2 benign  Colonoscopy:  05/10 normal- Dr. Collene Mares- repeat 10 years BMD:   none TDaP:  2017 with PCP Pneumonia vaccine(s):  Has had, but unsure of date Zostavax:   2017 with PCP Hep C testing: pretty sure she did with her Dr. Inda Merlin Screening Labs: PCP, Hb today: PCP, Urine today: PCP   reports that she has never smoked. She has never used smokeless tobacco. She reports that she drinks about 1.2 oz of alcohol per week . She reports that she does not use drugs.  Past Medical History:  Diagnosis Date  . Abnormal pap 1998   Negative Hpv  . Anxiety   . Arthritis   . Diabetes mellitus   . Dysrhythmia    hx of atrial tachycardia and pacs  . Esophageal reflux   . H/O hiatal hernia   . Hypercholesteremia   . Hypertension   . Obesity    compulsive overeater  . Osteoarthritis     Past Surgical History:  Procedure Laterality Date  . APPENDECTOMY  1975  . TOTAL KNEE ARTHROPLASTY  01/12/2012   Procedure: TOTAL KNEE ARTHROPLASTY;  Surgeon: Gearlean Alf, MD;  Location: [facility];  Service: Orthopedics;  Laterality: Left;    Current Outpatient Prescriptions  Medication Sig Dispense Refill  . atorvastatin (LIPITOR) 40 MG tablet Take 40 mg by mouth daily.  0  . cetirizine (ZYRTEC) 10 MG tablet Take 10 mg by mouth daily.    . diphenhydrAMINE (BENADRYL ALLERGY) 25 mg capsule Take 25 mg by mouth every 6 (six) hours as needed.    . DULoxetine (CYMBALTA) 60 MG capsule Take 60 mg by mouth daily.    Marland Kitchen etodolac (LODINE) 400 MG tablet    1  . fluticasone (FLONASE) 50 MCG/ACT nasal spray Place into both nostrils as needed.     Marland Kitchen FREESTYLE TEST STRIPS test strip   5  . glipiZIDE (GLUCOTROL XL) 10 MG 24 hr tablet Take 10 mg by mouth daily.  1  . lidocaine (XYLOCAINE) 5 % ointment Apply to thighs twice daily as needed. 35.44 g 0  . LORazepam (ATIVAN) 1 MG tablet Take 1 mg by mouth daily as needed for anxiety.    Marland Kitchen losartan-hydrochlorothiazide (HYZAAR) 100-25 MG per tablet Take 1 tablet by mouth every morning.    . metFORMIN (GLUCOPHAGE-XR) 500 MG 24 hr tablet Take 500 mg by mouth QID.     . Multiple Vitamins-Minerals (MULTIVITAMIN PO) Take by mouth.    Marland Kitchen omeprazole (PRILOSEC OTC) 20 MG tablet Take 20 mg by mouth as needed.     . pioglitazone (ACTOS) 30 MG tablet Take 30 mg by mouth daily.  1  . potassium chloride (K-DUR,KLOR-CON) 10 MEQ tablet Take 10 mEq by mouth daily.    . traMADol (ULTRAM) 50 MG tablet Take 50 mg by mouth as needed for pain.    Marland Kitchen zolpidem (AMBIEN) 10 MG tablet Take 10 mg by mouth at bedtime as needed. sleep  No current facility-administered medications for this visit.     Family History  Problem Relation Age of Onset  . Breast cancer Mother 73    Deceased, 43  . Heart attack Father     Deceased, 49  . COPD Father   . Obesity Sister   . Hypertension Brother   . Healthy Son     ROS:  Pertinent items are noted in HPI.  Otherwise, a comprehensive ROS was negative.  Exam:   BP 118/70 (BP Location: Right Arm, Patient Position: Sitting, Cuff Size: Large)   Pulse 72   Resp 14   Ht 5\' 5"  (1.651 m)   Wt 267 lb (121.1 kg)   LMP 06/16/2005   BMI 44.43 kg/m   Weight change: +18#   Height: 5\' 5"  (165.1 cm)  Ht Readings from Last 3 Encounters:  05/16/16 5\' 5"  (1.651 m)  02/02/15 5\' 5"  (1.651 m)  12/21/14 5\' 3"  (1.6 m)   General appearance: alert, cooperative and appears stated age Head: Normocephalic, without obvious abnormality, atraumatic Neck: no adenopathy, supple, symmetrical, trachea midline  and thyroid normal to inspection and palpation Lungs: clear to auscultation bilaterally Breasts: normal appearance, no masses or tenderness Heart: regular rate and rhythm Abdomen: soft, non-tender; bowel sounds normal; no masses,  no organomegaly Extremities: extremities normal, atraumatic, no cyanosis or edema Skin: Skin color, texture, turgor normal. No rashes or lesions Lymph nodes: Cervical, supraclavicular, and axillary nodes normal. No abnormal inguinal nodes palpated Neurologic: Grossly normal  Pelvic: External genitalia:  no lesions              Urethra:  normal appearing urethra with no masses, tenderness or lesions              Bartholins and Skenes: normal                 Vagina: normal appearing vagina with normal color and discharge, no lesions              Cervix: no lesions              Pap taken: No. Bimanual Exam:  Uterus:  normal size, contour, position, consistency, mobility, non-tender              Adnexa: normal adnexa and no mass, fullness, tenderness               Rectovaginal: Confirms               Anus:  normal sphincter tone, no lesions  Chaperone was present for exam.  A:   Well Woman with normal exam  PMP, no HRT  H/O abnormal Paps with neg HR HPV 2011. Not SA.  Hypertension DM OAB with possible SUI  P:  Mammogram up to date Neg pap with neg HR HPV 2016.  No pap indicated today. Labs with Dr. Inda Merlin every six months.  Trial of oxytrol patches.  Rx sent to pharmacy so pt can apply to her FSA. Declines BMD this year. return annually or prn

## 2016-05-16 NOTE — Telephone Encounter (Signed)
Medication refill request: Oxybutynin Last AEX:  05/16/16 SM Next AEX: 08/25/17 SM Last MMG (if hormonal medication request): 03/20/16 BIRADS0; 03/25/16-L Breast Ultrasound, BIRADS2 Refill authorized: 05/16/16 #8 Patches 12R. [facility] stating Oxytrol is not covered by insurance and please consider an alternative. Please advise. Thank you.

## 2016-05-19 NOTE — Telephone Encounter (Signed)
This is an OTC medication but she can apply it to her [facility] if a prescription is sent into the pharmacy.  So, I did as she asked.  Not sure if you need to notify the pharmacy regarding this.  I offered to give the pt a printed RX but she asked for it to be sent electronically.  Hope that helps.

## 2016-05-19 NOTE — Telephone Encounter (Signed)
[facility] stating that patient requested this medication be electronically sent by SM.

## 2016-06-23 ENCOUNTER — Telehealth: Payer: Self-pay | Admitting: Obstetrics & Gynecology

## 2016-06-23 NOTE — Telephone Encounter (Signed)
The second one is much cheaper and less time commitment.  I would suggest this one--essentials.

## 2016-06-23 NOTE — Telephone Encounter (Signed)
Spoke with patient. Patient states that she has been looking into different weight management programs and would like Dr.Miller's recommendations about the [facility]'s programs. Asking which program she recommends between the [employer] or [facility], nutritional? Advised I will review with Dr.Miller and return call with further recommendations.

## 2016-06-23 NOTE — Telephone Encounter (Signed)
Patient has some questions about the weight management program starting up in [place].  Wants more information on which one Dr Sabra Heck did.

## 2016-06-24 NOTE — Telephone Encounter (Signed)
Spoke with patient and gave recommendations per Dr. Miller. Patient voiced understanding -eh 

## 2016-07-08 DIAGNOSIS — G4733 Obstructive sleep apnea (adult) (pediatric): Secondary | ICD-10-CM | POA: Insufficient documentation

## 2016-08-14 DIAGNOSIS — Z862 Personal history of diseases of the blood and blood-forming organs and certain disorders involving the immune mechanism: Secondary | ICD-10-CM

## 2016-08-14 HISTORY — DX: Personal history of diseases of the blood and blood-forming organs and certain disorders involving the immune mechanism: Z86.2

## 2017-04-22 ENCOUNTER — Ambulatory Visit: Payer: BC Managed Care – PPO | Admitting: Obstetrics and Gynecology

## 2017-04-22 ENCOUNTER — Encounter: Payer: Self-pay | Admitting: Obstetrics and Gynecology

## 2017-04-22 VITALS — BP 168/98 | HR 80 | Resp 16 | Wt 281.0 lb

## 2017-04-22 DIAGNOSIS — R300 Dysuria: Secondary | ICD-10-CM

## 2017-04-22 DIAGNOSIS — N3946 Mixed incontinence: Secondary | ICD-10-CM | POA: Diagnosis not present

## 2017-04-22 LAB — POCT URINALYSIS DIPSTICK
Bilirubin, UA: NEGATIVE
Blood, UA: NEGATIVE
Glucose, UA: NEGATIVE
KETONES UA: NEGATIVE
PH UA: 8 (ref 5.0–8.0)
PROTEIN UA: NEGATIVE
Urobilinogen, UA: NEGATIVE E.U./dL — AB

## 2017-04-22 MED ORDER — OXYBUTYNIN CHLORIDE 5 MG PO TABS: 5.0000 mg | ORAL_TABLET | Freq: Three times a day (TID) | ORAL | 0 refills | Status: DC

## 2017-04-22 NOTE — Progress Notes (Signed)
GYNECOLOGY  VISIT   HPI: 61 y.o.   Divorced  Caucasian  female   G1P1001 with Patient's last menstrual period was 06/16/2005.   Here c/o urinary pressure X 2 weeks. Has a strange sensation of pressure at the end of voiding. Voiding normal amounts, just feels weird at the end. No fevers, no lower abdominal pain, no flank pain.  She has urinary incontinence, mostly coughing or sneezing or can just dribble. She usually leaks a little at a time. If she coughs with a full bladder it can be a large amount. Rarely leaks on the way to the bathroom if she has a full bladder. She has seen the urologist, did phsyical therapy, didn't really help.     Usually her Diabetes is under better control, recent HgbA1C was 7.6.   GYNECOLOGIC HISTORY: Patient's last menstrual period was 06/16/2005. Contraception:postmenopause  Menopausal hormone therapy: none         OB History    Gravida Para Term Preterm AB Living   1 1 1     1    SAB TAB Ectopic Multiple Live Births           1         Patient Active Problem List   Diagnosis Date Noted  . Hypertension   . Diabetes ([place])   . OA (osteoarthritis) of knee 01/12/2012    Past Medical History:  Diagnosis Date  . Abnormal pap 1998   Negative Hpv  . Anxiety   . Arthritis   . Diabetes mellitus   . Dysrhythmia    hx of atrial tachycardia and pacs  . Esophageal reflux   . H/O hiatal hernia   . Hypercholesteremia   . Hypertension   . Obesity    compulsive overeater  . Osteoarthritis     Past Surgical History:  Procedure Laterality Date  . APPENDECTOMY  1975    Current Outpatient Medications  Medication Sig Dispense Refill  . atorvastatin (LIPITOR) 40 MG tablet Take 40 mg by mouth daily.  0  . cetirizine (ZYRTEC) 10 MG tablet Take 10 mg by mouth daily.    . diphenhydrAMINE (BENADRYL ALLERGY) 25 mg capsule Take 25 mg by mouth every 6 (six) hours as needed.    . DULoxetine (CYMBALTA) 60 MG capsule Take 60 mg by mouth daily.    Marland Kitchen etodolac  (LODINE) 400 MG tablet   1  . fluticasone (FLONASE) 50 MCG/ACT nasal spray Place into both nostrils as needed.     Marland Kitchen FREESTYLE TEST STRIPS test strip   5  . glipiZIDE (GLUCOTROL XL) 10 MG 24 hr tablet Take 10 mg by mouth daily.  1  . lidocaine (XYLOCAINE) 5 % ointment Apply to thighs twice daily as needed. 35.44 g 0  . LORazepam (ATIVAN) 1 MG tablet Take 1 mg by mouth daily as needed for anxiety.    Marland Kitchen losartan-hydrochlorothiazide (HYZAAR) 100-25 MG per tablet Take 1 tablet by mouth every morning.    . metFORMIN (GLUCOPHAGE-XR) 500 MG 24 hr tablet Take 500 mg by mouth QID.     . Multiple Vitamins-Minerals (MULTIVITAMIN PO) Take by mouth.    Marland Kitchen omeprazole (PRILOSEC OTC) 20 MG tablet Take 20 mg by mouth as needed.     . pioglitazone (ACTOS) 30 MG tablet Take 30 mg by mouth daily.  1  . potassium chloride (K-DUR,KLOR-CON) 10 MEQ tablet Take 10 mEq by mouth daily.    . traMADol (ULTRAM) 50 MG tablet Take 50 mg by mouth as  needed for pain.    Marland Kitchen zolpidem (AMBIEN) 10 MG tablet Take 10 mg by mouth at bedtime as needed. sleep     No current facility-administered medications for this visit.      ALLERGIES: Diclofenac; Glucotrol [glipizide]; Lisinopril; and Adhesive [tape]  Family History  Problem Relation Age of Onset  . Breast cancer Mother 52       Deceased, 75  . Heart attack Father        Deceased, 65  . COPD Father   . Obesity Sister   . Hypertension Brother   . Healthy Son     Social History   Socioeconomic History  . Marital status: Divorced    Spouse name: Not on file  . Number of children: 1  . Years of education: Not on file  . Highest education level: Not on file  Social Needs  . Financial resource strain: Not on file  . Food insecurity - worry: Not on file  . Food insecurity - inability: Not on file  . Transportation needs - medical: Not on file  . Transportation needs - non-medical: Not on file  Occupational History    Employer: [employer]  Tobacco Use  . Smoking status:  Never Smoker  . Smokeless tobacco: Never Used  Substance and Sexual Activity  . Alcohol use: Yes    Alcohol/week: 1.2 oz    Types: 2 Standard drinks or equivalent per week  . Drug use: No  . Sexual activity: No    Partners: Male    Birth control/protection: Post-menopausal  Other Topics Concern  . 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   Lives alone.     Highest level of education:  One year graduate school    Review of Systems  Constitutional: Negative.   HENT: Negative.   Eyes: Negative.   Respiratory: Negative.   Cardiovascular: Negative.   Gastrointestinal: Negative.   Genitourinary: Positive for urgency.  Musculoskeletal: Negative.   Skin: Negative.   Neurological: Negative.   Endo/Heme/Allergies: Negative.   Psychiatric/Behavioral: Negative.     PHYSICAL EXAMINATION:    BP (!) 168/98 (BP Location: Right Arm, Patient Position: Sitting, Cuff Size: Large) Comment: patient has not taken BP medication today  Pulse 80   Resp 16   Wt 281 lb (127.5 kg)   LMP 06/16/2005   BMI 46.76 kg/m     General appearance: alert, cooperative and appears stated age Abdomen: soft, non-tender; non distended, no masses,  no organomegaly CVA: not tender   Urine dip: 2+ leuk, otherwise negative  ASSESSMENT Urinary pressure at the end of voiding Mixed urinary incontinence, mostly stress, c/o dribbling throughout the day, some urge. We discussed her getting formal urodynamic testing. Previously she was given a script for oxybutynin patch (couldn't get it filled)    PLAN Send urine for ua, c&s Call with worsening bladder symptoms I told her I wasn't sure the oxybutynin would help her, but she could try the IR tablets and see if she thought it helped. If not she should see Dr Quincy Simmonds for further evaluation One month supply of oxybutynin sent to the pharmacy    An After Visit Summary was printed and given to the patient.  ~15 minutes face  to face time of which over 50% was spent in counseling.   CC: Dr Sabra Heck

## 2017-04-23 LAB — URINALYSIS, MICROSCOPIC ONLY
Casts: NONE SEEN /lpf
Epithelial Cells (non renal): >10 /hpf — AB (ref 0–10)
WBC, UA: >30 /hpf — AB (ref 0–?)

## 2017-04-24 ENCOUNTER — Telehealth: Payer: Self-pay | Admitting: *Deleted

## 2017-04-24 ENCOUNTER — Other Ambulatory Visit: Payer: Self-pay | Admitting: Obstetrics & Gynecology

## 2017-04-24 MED ORDER — NITROFURANTOIN MONOHYD MACRO 100 MG PO CAPS: 100.0000 mg | ORAL_CAPSULE | Freq: Two times a day (BID) | ORAL | 0 refills | Status: DC

## 2017-04-24 NOTE — Telephone Encounter (Signed)
Dr. Sabra Heck -please review urine culture results dated 04/22/17 and advise?

## 2017-04-24 NOTE — Telephone Encounter (Signed)
Reviewed urine culture results with Dr. Sabra Heck -Macrobid 100mg  bid x5 days recommended.   Spoke with patient, advised of results and recommendations. [facility]. Patient aware will be called with final results.  Patient verbalizes understanding and is agreeable.  Routing to provider for final review. Patient is agreeable to disposition. Will close encounter.  Cc: Dr. Talbert Nan

## 2017-04-24 NOTE — Telephone Encounter (Signed)
-----   Message from Salvadore Dom, MD sent at 04/23/2017  6:02 PM EST ----- Please check the patient's culture results tomorrow, then call her. Her ua has WBC's, but is a contaminated specimen. If she needs an antibiotic please run it by Dr Sabra Heck.  Thanks

## 2017-04-25 LAB — URINE CULTURE

## 2017-04-27 DIAGNOSIS — H40013 Open angle with borderline findings, low risk, bilateral: Secondary | ICD-10-CM | POA: Insufficient documentation

## 2017-04-29 ENCOUNTER — Telehealth: Payer: Self-pay | Admitting: Obstetrics and Gynecology

## 2017-04-29 NOTE — Telephone Encounter (Signed)
Spoke with patient. Patient states she has just recently started oxybutynin tid, is experiencing dry mouth and would like to know if r/t medication?  Advised patient dry mouth can be side effect of oxybutynin. Advised to increase fluids, practice good mouth care, hard sugar-free candy or gum.   Patient states she has not increased fluids, feels like medication is working, will need more time to tell. Patient asking if frequency of medication can be reduced to 2 times per day?   Advised patient will review with Dr. Talbert Nan and return call with recommendations, patient is agreeable.  Dr. Talbert Nan -please advise?

## 2017-04-29 NOTE — Telephone Encounter (Signed)
Patient is experiencing dry mouth and wondering if it can be coming from her taking oxybutynin 3 tines a day.

## 2017-04-29 NOTE — Telephone Encounter (Signed)
Absolutely, she can decrease to 2 x a day or even 1 time a day if she wants to see the different effects. The medication can cause dry mouth and constipation.

## 2017-05-19 ENCOUNTER — Other Ambulatory Visit: Payer: Self-pay | Admitting: Obstetrics and Gynecology

## 2017-05-19 NOTE — Telephone Encounter (Signed)
Medication refill request: Ditropan  Last AEX:  05-16-16  Next AEX: 08-14-17  Last MMG (if hormonal medication request): 03-20-16 WNL  Refill authorized: please advise

## 2017-08-05 DIAGNOSIS — Z96652 Presence of left artificial knee joint: Secondary | ICD-10-CM | POA: Insufficient documentation

## 2017-08-25 ENCOUNTER — Ambulatory Visit: Payer: BC Managed Care – PPO | Admitting: Obstetrics & Gynecology

## 2017-09-09 ENCOUNTER — Ambulatory Visit: Payer: BC Managed Care – PPO | Admitting: Obstetrics & Gynecology

## 2017-12-04 ENCOUNTER — Ambulatory Visit: Payer: BC Managed Care – PPO | Admitting: Obstetrics & Gynecology

## 2017-12-04 ENCOUNTER — Other Ambulatory Visit (HOSPITAL_COMMUNITY)
Admission: RE | Admit: 2017-12-04 | Discharge: 2017-12-04 | Disposition: A | Discharge status: Home / Self Care | Payer: BC Managed Care – PPO | Source: Ambulatory Visit | Attending: Obstetrics & Gynecology | Admitting: Obstetrics & Gynecology

## 2017-12-04 ENCOUNTER — Encounter

## 2017-12-04 ENCOUNTER — Other Ambulatory Visit: Payer: Self-pay

## 2017-12-04 ENCOUNTER — Encounter: Payer: Self-pay | Admitting: Obstetrics & Gynecology

## 2017-12-04 VITALS — BP 124/80 | HR 76 | Resp 14 | Ht 65.0 in | Wt 275.0 lb

## 2017-12-04 DIAGNOSIS — Z862 Personal history of diseases of the blood and blood-forming organs and certain disorders involving the immune mechanism: Secondary | ICD-10-CM | POA: Diagnosis not present

## 2017-12-04 DIAGNOSIS — Z124 Encounter for screening for malignant neoplasm of cervix: Secondary | ICD-10-CM | POA: Insufficient documentation

## 2017-12-04 DIAGNOSIS — Z205 Contact with and (suspected) exposure to viral hepatitis: Secondary | ICD-10-CM | POA: Diagnosis not present

## 2017-12-04 DIAGNOSIS — Z01419 Encounter for gynecological examination (general) (routine) without abnormal findings: Secondary | ICD-10-CM

## 2017-12-04 MED ORDER — OXYBUTYNIN CHLORIDE 5 MG PO TABS: 5.0000 mg | ORAL_TABLET | Freq: Three times a day (TID) | ORAL | 12 refills | Status: DC

## 2017-12-04 NOTE — Progress Notes (Signed)
62 y.o. G1P1001 DivorcedCaucasianF here for annual exam.  Having a lot of issues with her right knee.  Basically, ready to have right knee replacement.  Followed by Dr. Maureen Ralphs.  He has recommended she work on weight loss first.  She is going to try CBD oil for joint pain.  Denies vaginal bleeding.      Has anemia last year related to GI ulcer.  Hb 9.1.  On prilosec now.  Does need follow up blood work.  Doing intuitive eating program.  Has lost 12 pounds in 12 weeks.  Does 3 days on and one day off of diet.  On 3 days on she tries to eat no sugar.  On the 4th day, this is a free day.    PCP:  Dr. Inda Merlin.  Seen every 3 months.  HbA1C most recently was 7.2.  Patient's last menstrual period was 06/16/2005.          Sexually active: No.  The current method of family planning is post menopausal status.    Exercising: Yes.    swim, recumbent bike Smoker:  no  Health Maintenance: Pap:  02/02/15 Neg. HR HPV:neg   09/28/12 Neg  History of abnormal Pap:  yes MMG:  03/25/16 [place] left BIRADS2:Benign f/u 1 year .  Aware this is overdue Colonoscopy:  11/12/16 f/u 5 years  BMD:   Never TDaP:  2017 Pneumonia vaccine(s): done Shingrix: unsure.  Feels like she has gotten this.   Hep C testing: unsure Screening Labs: PCP   reports that she has never smoked. She has never used smokeless tobacco. She reports that she drinks about 1.2 oz of alcohol per week. She reports that she does not use drugs.  Past Medical History:  Diagnosis Date  . Abnormal pap 1998   Negative Hpv  . Anxiety   . Arthritis   . Diabetes mellitus   . Dysrhythmia    hx of atrial tachycardia and pacs  . Esophageal reflux   . H/O hiatal hernia   . H/O iron deficiency anemia 08/2016  . Hypercholesteremia   . Hypertension   . Obesity    compulsive overeater  . Osteoarthritis     Past Surgical History:  Procedure Laterality Date  . APPENDECTOMY  1975  . TOTAL KNEE ARTHROPLASTY  01/12/2012   Procedure: TOTAL KNEE ARTHROPLASTY;   Surgeon: Gearlean Alf, MD;  Location: [facility];  Service: Orthopedics;  Laterality: Left;    Current Outpatient Medications  Medication Sig Dispense Refill  . albuterol (PROAIR HFA) 108 (90 Base) MCG/ACT inhaler daily as needed.    Marland Kitchen atorvastatin (LIPITOR) 40 MG tablet Take 40 mg by mouth daily.  0  . cetirizine (ZYRTEC) 10 MG tablet Take 10 mg by mouth daily.    . diphenhydrAMINE (BENADRYL ALLERGY) 25 mg capsule Take 25 mg by mouth every 6 (six) hours as needed.    . etodolac (LODINE) 400 MG tablet   1  . fluticasone (FLONASE) 50 MCG/ACT nasal spray Place into both nostrils as needed.     Marland Kitchen FREESTYLE TEST STRIPS test strip   5  . glipiZIDE (GLUCOTROL XL) 10 MG 24 hr tablet Take 10 mg by mouth daily.  1  . Iron-FA-B Cmp-C-Biot-Probiotic (FUSION PLUS) CAPS daily.    Marland Kitchen LORazepam (ATIVAN) 1 MG tablet Take 1 mg by mouth daily as needed for anxiety.    Marland Kitchen losartan-hydrochlorothiazide (HYZAAR) 100-25 MG per tablet Take 1 tablet by mouth every morning.    . metFORMIN (GLUCOPHAGE-XR)  500 MG 24 hr tablet Take 500 mg by mouth QID.     . Multiple Vitamins-Minerals (MULTIVITAMIN PO) Take by mouth.    Marland Kitchen omeprazole (PRILOSEC OTC) 20 MG tablet Take 20 mg by mouth as needed.     Marland Kitchen oxybutynin (DITROPAN) 5 MG tablet TAKE 1 TABLET 3 TIMES A DAY 90 tablet 1  . pioglitazone (ACTOS) 30 MG tablet Take 30 mg by mouth daily.  1  . potassium chloride (K-DUR,KLOR-CON) 10 MEQ tablet Take 10 mEq by mouth daily.    Marland Kitchen zolpidem (AMBIEN) 10 MG tablet Take 10 mg by mouth at bedtime as needed. sleep     No current facility-administered medications for this visit.     Family History  Problem Relation Age of Onset  . Heart attack Father        Deceased, 67  . COPD Father   . Breast cancer Mother 59       Deceased, 17  . Obesity Sister   . Hypertension Brother   . Healthy Son     Review of Systems  Genitourinary:       Loss of urine with sneeze of cough   All other systems reviewed and are negative.   Exam:    BP 124/80 (BP Location: Right Arm, Patient Position: Sitting, Cuff Size: Large)   Pulse 76   Resp 14   Ht 5\' 5"  (1.651 m)   Wt 275 lb (124.7 kg)   LMP 06/16/2005   BMI 45.76 kg/m    Height: 5\' 5"  (165.1 cm)  Ht Readings from Last 3 Encounters:  12/04/17 5\' 5"  (1.651 m)  05/16/16 5\' 5"  (1.651 m)  02/02/15 5\' 5"  (1.651 m)    General appearance: alert, cooperative and appears stated age Head: Normocephalic, without obvious abnormality, atraumatic Neck: no adenopathy, supple, symmetrical, trachea midline and thyroid normal to inspection and palpation Lungs: clear to auscultation bilaterally Breasts: normal appearance, no masses or tenderness Heart: regular rate and rhythm Abdomen: soft, non-tender; bowel sounds normal; no masses,  no organomegaly Extremities: extremities normal, atraumatic, no cyanosis or edema Skin: Skin color, texture, turgor normal. No rashes or lesions Lymph nodes: Cervical, supraclavicular, and axillary nodes normal. No abnormal inguinal nodes palpated Neurologic: Grossly normal   Pelvic: External genitalia:  no lesions              Urethra:  normal appearing urethra with no masses, tenderness or lesions              Bartholins and Skenes: normal                 Vagina: normal appearing vagina with normal color and discharge, no lesions              Cervix: no lesions              Pap taken: Yes.   Bimanual Exam:  Uterus:  normal size, contour, position, consistency, mobility, non-tender              Adnexa: normal adnexa and no mass, fullness, tenderness               Rectovaginal: Confirms               Anus:  normal sphincter tone, no lesions  Chaperone was present for exam.  A:  Well Woman with normal exam PMP, no HRT H/o OAB Diabetes, on oral agents H/o abnormal paps with neg HR HPV 2011  P:   Mammogram  guidelines reviewed.  Aware this is due. pap smear and HR HPV obtained today RF for oxybutynin 5mg  tid #90/4RF Working on weight loss CBC,  iron and ferritin levels obtained today Hep C antibody will be obtained today as well Return annually or prn

## 2017-12-05 LAB — HEPATITIS C ANTIBODY

## 2017-12-05 LAB — CBC
Hematocrit: 37.9 % (ref 34.0–46.6)
Hemoglobin: 13.1 g/dL (ref 11.1–15.9)
MCH: 27.7 pg (ref 26.6–33.0)
MCHC: 34.6 g/dL (ref 31.5–35.7)
MCV: 80 fL (ref 79–97)
PLATELETS: 462 10*3/uL — AB (ref 150–450)
RBC: 4.73 x10E6/uL (ref 3.77–5.28)
RDW: 14.6 % (ref 12.3–15.4)
WBC: 9.7 10*3/uL (ref 3.4–10.8)

## 2017-12-05 LAB — FERRITIN: FERRITIN: 22 ng/mL (ref 15–150)

## 2017-12-07 ENCOUNTER — Other Ambulatory Visit: Payer: Self-pay | Admitting: Obstetrics & Gynecology

## 2017-12-07 DIAGNOSIS — D75839 Thrombocytosis, unspecified: Secondary | ICD-10-CM

## 2017-12-07 DIAGNOSIS — D473 Essential (hemorrhagic) thrombocythemia: Secondary | ICD-10-CM

## 2017-12-07 NOTE — Progress Notes (Signed)
Cbc order placed

## 2017-12-08 LAB — CYTOLOGY - PAP
DIAGNOSIS: NEGATIVE
HPV (WINDOPATH): NOT DETECTED

## 2017-12-19 ENCOUNTER — Encounter: Payer: Self-pay | Admitting: Obstetrics & Gynecology

## 2017-12-21 ENCOUNTER — Encounter: Payer: Self-pay | Admitting: Obstetrics & Gynecology

## 2017-12-21 NOTE — Telephone Encounter (Signed)
Response to patient. Routing to Dr Sabra Heck. Encounter closed.

## 2017-12-22 ENCOUNTER — Other Ambulatory Visit: Payer: Self-pay | Admitting: Obstetrics & Gynecology

## 2017-12-22 DIAGNOSIS — R3915 Urgency of urination: Secondary | ICD-10-CM

## 2017-12-24 ENCOUNTER — Ambulatory Visit: Payer: BC Managed Care – PPO | Admitting: Obstetrics & Gynecology

## 2017-12-24 ENCOUNTER — Encounter: Payer: Self-pay | Admitting: Obstetrics & Gynecology

## 2017-12-24 ENCOUNTER — Telehealth: Payer: Self-pay | Admitting: Obstetrics & Gynecology

## 2017-12-24 VITALS — BP 118/86 | HR 68 | Resp 18 | Ht 65.0 in | Wt 276.0 lb

## 2017-12-24 DIAGNOSIS — D473 Essential (hemorrhagic) thrombocythemia: Secondary | ICD-10-CM

## 2017-12-24 DIAGNOSIS — R3915 Urgency of urination: Secondary | ICD-10-CM

## 2017-12-24 DIAGNOSIS — D75839 Thrombocytosis, unspecified: Secondary | ICD-10-CM

## 2017-12-24 LAB — POCT URINALYSIS DIPSTICK
BILIRUBIN UA: NEGATIVE
Blood, UA: NEGATIVE
GLUCOSE UA: NEGATIVE
Ketones, UA: NEGATIVE
Nitrite, UA: NEGATIVE
PH UA: 5 (ref 5.0–8.0)
Protein, UA: POSITIVE — AB
UROBILINOGEN UA: 0.2 U/dL

## 2017-12-24 NOTE — Progress Notes (Signed)
GYNECOLOGY  VISIT  CC:   Possible UTI  HPI: 62 y.o. G68P1001 Divorced Caucasian female here for urinary urgency and frequency.  Symptoms has been intermittent over the past five weeks.  Denies change in color or odor of urine.  Denies fever and back pain.  She was recently here for annual exam.  We did discuss her urinary urgency that is much improved with the ditropan.  She feels, however, that she is having more urgency that has been present prior to about a month ago.  Has previously had a UTI and symptoms were similar--not really dysuria as much as increased urgency and frequency.  Denies hematuria.    GYNECOLOGIC HISTORY: Patient's last menstrual period was 06/16/2005. Contraception: post menopausal  Menopausal hormone therapy: none  Patient Active Problem List   Diagnosis Date Noted  . History of total left knee replacement 08/05/2017  . Open angle with borderline findings and low glaucoma risk in both eyes 04/27/2017  . Obstructive sleep apnea 07/08/2016  . Hypertension   . Diabetes ([place])   . Primary osteoarthritis of right knee 01/12/2012    Past Medical History:  Diagnosis Date  . Abnormal pap 1998   Negative Hpv  . Anxiety   . Arthritis   . Diabetes mellitus   . Dysrhythmia    hx of atrial tachycardia and pacs  . Esophageal reflux   . H/O hiatal hernia   . H/O iron deficiency anemia 08/2016  . Hypercholesteremia   . Hypertension   . Obesity    compulsive overeater  . Osteoarthritis     Past Surgical History:  Procedure Laterality Date  . APPENDECTOMY  1975  . TOTAL KNEE ARTHROPLASTY  01/12/2012   Procedure: TOTAL KNEE ARTHROPLASTY;  Surgeon: Gearlean Alf, MD;  Location: [facility];  Service: Orthopedics;  Laterality: Left;    MEDS:   Current Outpatient Medications on File Prior to Visit  Medication Sig Dispense Refill  . albuterol (PROAIR HFA) 108 (90 Base) MCG/ACT inhaler daily as needed.    Marland Kitchen atorvastatin (LIPITOR) 40 MG tablet Take 40 mg by mouth  daily.  0  . cetirizine (ZYRTEC) 10 MG tablet Take 10 mg by mouth daily.    . diphenhydrAMINE (BENADRYL ALLERGY) 25 mg capsule Take 25 mg by mouth every 6 (six) hours as needed.    . etodolac (LODINE) 400 MG tablet   1  . fluticasone (FLONASE) 50 MCG/ACT nasal spray Place into both nostrils as needed.     Marland Kitchen FREESTYLE TEST STRIPS test strip   5  . glipiZIDE (GLUCOTROL XL) 10 MG 24 hr tablet Take 10 mg by mouth daily.  1  . Iron-FA-B Cmp-C-Biot-Probiotic (FUSION PLUS) CAPS daily.    Marland Kitchen LORazepam (ATIVAN) 1 MG tablet Take 1 mg by mouth daily as needed for anxiety.    Marland Kitchen losartan-hydrochlorothiazide (HYZAAR) 100-25 MG per tablet Take 1 tablet by mouth every morning.    . metFORMIN (GLUCOPHAGE-XR) 500 MG 24 hr tablet Take 500 mg by mouth QID.     . Multiple Vitamins-Minerals (MULTIVITAMIN PO) Take by mouth.    Marland Kitchen omeprazole (PRILOSEC OTC) 20 MG tablet Take 20 mg by mouth as needed.     Marland Kitchen oxybutynin (DITROPAN) 5 MG tablet Take 1 tablet (5 mg total) by mouth 3 (three) times daily. 90 tablet 12  . pioglitazone (ACTOS) 30 MG tablet Take 30 mg by mouth daily.  1  . potassium chloride (K-DUR,KLOR-CON) 10 MEQ tablet Take 10 mEq by mouth daily.    Marland Kitchen  zolpidem (AMBIEN) 10 MG tablet Take 10 mg by mouth at bedtime as needed. sleep     No current facility-administered medications on file prior to visit.     ALLERGIES: Diclofenac; Glucotrol [glipizide]; Lisinopril; and Adhesive [tape]  Family History  Problem Relation Age of Onset  . Heart attack Father        Deceased, 45  . COPD Father   . Breast cancer Mother 34       Deceased, 13  . Obesity Sister   . Hypertension Brother   . Healthy Son     SH:  divorced  Review of Systems  All other systems reviewed and are negative.   PHYSICAL EXAMINATION:    BP 118/86 (BP Location: Right Arm, Patient Position: Sitting, Cuff Size: Large)   Pulse 68   Resp 18   Ht 5\' 5"  (1.651 m)   Wt 276 lb (125.2 kg)   LMP 06/16/2005   BMI 45.93 kg/m     General  appearance: alert, cooperative and appears stated age Abdomen: soft, non-tender; bowel sounds normal; no masses,  no organomegaly Lymph:  No inguinal LAD noted  Pelvic: External genitalia:  no lesions              Urethra:  normal appearing urethra with no masses, tenderness or lesions              Bartholins and Skenes: normal                 Vagina: normal appearing vagina with normal color and discharge, no lesions  Chaperone was present for exam.  Assessment: Urinary urgency H/o OAB H/o elevated platelets  Plan: Urine micro and culture pending. Repeating CBC today

## 2017-12-24 NOTE — Telephone Encounter (Signed)
Patient believes she may have a UTI. Would like to come in for urine culture only and only see the doctor if positive for UTI.

## 2017-12-24 NOTE — Telephone Encounter (Signed)
Spoke with patient. Patient reports urinary frequency, urgency, voiding small amounts, fullness and "warmth". Symptoms started 1 mo ago, patient was to return for UC and repeat CBC for elevated platelets. Seen for AEX on 12/04/17.   Patient states symptoms have become more bothersome, requesting to come in today for UC. Recommended OV for further evaluation, scheduled for today at 3:30pm. Advised patient will review with Dr. Sabra Heck and change to nurse visit if recommended. Patient agreeable.   Routing to provider for final review. Patient is agreeable to disposition. Will close encounter.

## 2017-12-25 LAB — CBC
Hematocrit: 38.6 % (ref 34.0–46.6)
Hemoglobin: 12.9 g/dL (ref 11.1–15.9)
MCH: 27.3 pg (ref 26.6–33.0)
MCHC: 33.4 g/dL (ref 31.5–35.7)
MCV: 82 fL (ref 79–97)
PLATELETS: 504 10*3/uL — AB (ref 150–450)
RBC: 4.72 x10E6/uL (ref 3.77–5.28)
RDW: 14.7 % (ref 12.3–15.4)
WBC: 9.9 10*3/uL (ref 3.4–10.8)

## 2017-12-25 LAB — URINALYSIS, MICROSCOPIC ONLY: Casts: NONE SEEN /lpf

## 2017-12-28 LAB — URINE CULTURE

## 2017-12-29 ENCOUNTER — Other Ambulatory Visit: Payer: Self-pay | Admitting: *Deleted

## 2017-12-29 MED ORDER — NITROFURANTOIN MONOHYD MACRO 100 MG PO CAPS: 100.0000 mg | ORAL_CAPSULE | Freq: Two times a day (BID) | ORAL | 0 refills | Status: DC

## 2017-12-30 ENCOUNTER — Other Ambulatory Visit: Payer: Self-pay | Admitting: Obstetrics & Gynecology

## 2017-12-30 ENCOUNTER — Encounter: Payer: Self-pay | Admitting: Obstetrics & Gynecology

## 2017-12-30 MED ORDER — NITROFURANTOIN MONOHYD MACRO 100 MG PO CAPS: 100.0000 mg | ORAL_CAPSULE | Freq: Two times a day (BID) | ORAL | 0 refills | Status: AC

## 2018-01-06 ENCOUNTER — Other Ambulatory Visit: Payer: BC Managed Care – PPO

## 2018-03-19 ENCOUNTER — Ambulatory Visit: Payer: BC Managed Care – PPO | Admitting: Internal Medicine

## 2018-05-03 ENCOUNTER — Encounter: Payer: Self-pay | Admitting: Internal Medicine

## 2018-05-03 ENCOUNTER — Ambulatory Visit (INDEPENDENT_AMBULATORY_CARE_PROVIDER_SITE_OTHER): Payer: BC Managed Care – PPO | Admitting: Internal Medicine

## 2018-05-03 VITALS — BP 120/60 | HR 90 | Ht 65.0 in | Wt 268.0 lb

## 2018-05-03 DIAGNOSIS — E1165 Type 2 diabetes mellitus with hyperglycemia: Secondary | ICD-10-CM | POA: Diagnosis not present

## 2018-05-03 LAB — POCT GLYCOSYLATED HEMOGLOBIN (HGB A1C): HEMOGLOBIN A1C: 6.1 % — AB (ref 4.0–5.6)

## 2018-05-03 MED ORDER — PIOGLITAZONE HCL 30 MG PO TABS: 15.0000 mg | ORAL_TABLET | Freq: Every day | ORAL | 1 refills | Status: DC

## 2018-05-03 MED ORDER — SEMAGLUTIDE(0.25 OR 0.5MG/DOS) 2 MG/1.5ML ~~LOC~~ SOPN: 0.5000 mg | PEN_INJECTOR | SUBCUTANEOUS | 5 refills | Status: DC

## 2018-05-03 MED ORDER — GLIPIZIDE ER 5 MG PO TB24
5.0000 mg | ORAL_TABLET | Freq: Every day | ORAL | 3 refills | Status: DC
Start: 2018-05-03 — End: 2019-04-29

## 2018-05-03 NOTE — Progress Notes (Signed)
Patient ID: Barbara Jackson, female   DOB: January 17, 1956, 62 y.o.   MRN: [id number]   HPI: Barbara Jackson is a 62 y.o.-year-old female, referred by her PCP, Dr. Inda Merlin, for management of DM2, dx in 2007, non-insulin-dependent, uncontrolled, without long-term complications.  She will have knee Sx soon >> needs to lose weight until then.  Last hemoglobin A1c was: 02/22/2018: HbA1c 7.0% 07/2017: HbA1c 7.7% 07/10/2016: HbA1c 7.0% No results found for: HGBA1C  Pt is on a regimen of: - Metformin ER 2000 mg after dinner - Glipizide XL 10 mg before breakfast - Actos 30 mg daily at night She tried Januvia, but stopped last year when she was on a new diet at that time and did not want to make any changes in her medicines.  Pt checks her sugars seldom - she checked for few days before this visit and wrote them down: - am:130-169 - 2h after b'fast: 119, 162 - before lunch: n/c - 1h after lunch: 158, 202 - before dinner: 123, 192 - 2h after dinner: 176 - bedtime: n/c - nighttime: n/c No lows. Lowest sugar was 119; she has hypoglycemia awareness at 70.  Highest sugar was 202.  Glucometer: One Touch Ultra 2  Patient has binge eating disorder.  She [facility] management center at [facility].  Pt's meals are: - Breakfast: sandwich - egg, cheese, bacon, 1/2 biscuit - Lunch: [place] sandwich, sometimes chips chicken salad - Dinner: chicken salad, hamburger + veggies, chipotle, pork chops - Snacks: 2-3 a day: fruit or chocolate She saw [name] with nutrition.  She was also seen in the binge eating clinic at [facility].  She did well recently with her binge eating disorder and lost 20 pounds in the last several months.  She exercises by swimming twice a week and using her recumbent bike 2-3 times a week.  - no CKD, last BUN/creatinine:  02/22/2018: 17/0.88, GFR 65, glucose 136, with the rest of the CMP normal; ACR 6.4 07/10/2016: 20/0.83 Lab Results  Component Value Date   BUN  19 01/14/2012   BUN 14 01/13/2012   CREATININE 0.83 01/14/2012   CREATININE 0.73 01/13/2012  On losartan 100.  - + HL;  last set of lipids: 02/22/2018: 139/109/71/46 07/10/2016: 164/122/84/32 No results found for: CHOL, HDL, LDLCALC, LDLDIRECT, TRIG, CHOLHDL  On Lipitor 40.  - last eye exam was in 04/27/2017. No DR.   - no numbness and tingling in her feet.  Pt has FH of DM in MGM.  She is also on Cymbalta for depression.  She also has HTN.  ROS: Constitutional: + Weight gain, + weight loss, + fatigue, no subjective hyperthermia, no subjective hypothermia, no nocturia Eyes: no blurry vision, no xerophthalmia ENT: no sore throat, no nodules palpated in neck, no dysphagia, no odynophagia, no hoarseness, + tinnitus, no hypoacusis Cardiovascular: no CP, no SOB, + palpitations, no leg swelling Respiratory: no cough, no SOB, no wheezing Gastrointestinal: no N, no V, no D, no C, + acid reflux Musculoskeletal: no muscle, + joint aches Skin: no rash, no hair loss Neurological: no tremors, no numbness or tingling/no dizziness/no HAs Psychiatric: + Depression, + anxiety  Past Medical History:  Diagnosis Date  . Abnormal pap 1998   Negative Hpv  . Anxiety   . Arthritis   . Diabetes mellitus   . Dysrhythmia    hx of atrial tachycardia and pacs  . Esophageal reflux   . H/O hiatal hernia   . H/O iron deficiency  anemia 08/2016  . Hypercholesteremia   . Hypertension   . Obesity    compulsive overeater  . Osteoarthritis    Past Surgical History:  Procedure Laterality Date  . APPENDECTOMY  1975  . TOTAL KNEE ARTHROPLASTY  01/12/2012   Procedure: TOTAL KNEE ARTHROPLASTY;  Surgeon: Gearlean Alf, MD;  Location: [facility];  Service: Orthopedics;  Laterality: Left;   Social History   Socioeconomic History  . Marital status: Divorced    Spouse name: Not on file  . Number of children: 1  . Years of education: Not on file  . Highest education level: Not on file  Occupational History     Employer: [employer] -she is a Chartered certified accountant  Tobacco Use  . Smoking status: Never Smoker  . Smokeless tobacco: Never Used  Substance and Sexual Activity  . Alcohol use: Yes    Alcohol/week:  Wine, cocktails    Types: 1-2 Standard drinks or equivalent per week  . Drug use: No  . 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     Highest level of education:  One year graduate school   Current Outpatient Medications on File Prior to Visit  Medication Sig Dispense Refill  . albuterol (PROAIR HFA) 108 (90 Base) MCG/ACT inhaler daily as needed.    Marland Kitchen atorvastatin (LIPITOR) 40 MG tablet Take 40 mg by mouth daily.  0  . cetirizine (ZYRTEC) 10 MG tablet Take 10 mg by mouth daily.    . diphenhydrAMINE (BENADRYL ALLERGY) 25 mg capsule Take 25 mg by mouth every 6 (six) hours as needed.    . etodolac (LODINE) 400 MG tablet   1  . fluticasone (FLONASE) 50 MCG/ACT nasal spray Place into both nostrils as needed.     Marland Kitchen FREESTYLE TEST STRIPS test strip   5  . glipiZIDE (GLUCOTROL XL) 10 MG 24 hr tablet Take 10 mg by mouth daily.  1  . Iron-FA-B Cmp-C-Biot-Probiotic (FUSION PLUS) CAPS daily.    Marland Kitchen LORazepam (ATIVAN) 1 MG tablet Take 1 mg by mouth daily as needed for anxiety.    Marland Kitchen losartan-hydrochlorothiazide (HYZAAR) 100-25 MG per tablet Take 1 tablet by mouth every morning.    . metFORMIN (GLUCOPHAGE-XR) 500 MG 24 hr tablet Take 500 mg by mouth QID.     . Multiple Vitamins-Minerals (MULTIVITAMIN PO) Take by mouth.    Marland Kitchen omeprazole (PRILOSEC OTC) 20 MG tablet Take 20 mg by mouth as needed.     Marland Kitchen oxybutynin (DITROPAN) 5 MG tablet Take 1 tablet (5 mg total) by mouth 3 (three) times daily. 90 tablet 12  . pioglitazone (ACTOS) 30 MG tablet Take 30 mg by mouth daily.  1  . potassium chloride (K-DUR,KLOR-CON) 10 MEQ tablet Take 10 mEq by mouth daily.    Marland Kitchen zolpidem (AMBIEN)  10 MG tablet Take 10 mg by mouth at bedtime as needed. sleep     No current facility-administered medications on file prior to visit.    Allergies  Allergen Reactions  . Diclofenac Other (See Comments)    GAS  . Glucotrol [Glipizide]   . Lisinopril Cough  . Adhesive [Tape] Rash   Family History  Problem Relation Age of Onset  . Heart attack Father        Deceased, 76  . COPD Father   . Breast cancer Mother 64  Deceased, 23  . Obesity Sister   . Hypertension Brother   . Healthy Son     PE: BP 120/60   Pulse 90   Ht 5\' 5"  (1.651 m)   Wt 268 lb (121.6 kg)   LMP 06/16/2005   SpO2 98%   BMI 44.60 kg/m  Wt Readings from Last 3 Encounters:  05/03/18 268 lb (121.6 kg)  12/24/17 276 lb (125.2 kg)  12/04/17 275 lb (124.7 kg)   Constitutional: overweight, in NAD Eyes: PERRLA, EOMI, no exophthalmos ENT: moist mucous membranes, no thyromegaly, no cervical lymphadenopathy Cardiovascular: RRR, No MRG Respiratory: CTA B Gastrointestinal: abdomen soft, NT, ND, BS+ Musculoskeletal: no deformities, strength intact in all 4 Skin: moist, warm, no rashes Neurological: no tremor with outstretched hands, DTR normal in all 4  ASSESSMENT: 1. DM2, non-insulin-dependent, uncontrolled, without long-term complications, but with hyperglycemia  PLAN:  1. Patient with long-standing, uncontrolled diabetes, on oral antidiabetic regimen, which became insufficient.  We reviewed her sugars from the last few days and they were at or slightly above goal.  I believe that these improved in the last few months.  Indeed, HbA1c today has improved to 6.1%, previously 7%. - At this visit, we discussed at length about the concept of insulin resistance and how to improve this especially by improving her diet.  She is eating a very fatty breakfast and I suggested several ways in which she can reduce the amount of fat in her diet.  I explained that this will allow her to reduce the doses of her medicines and  improved her diabetes, cholesterol, weight. - She is very depressed to lose weight so that she can have her total knee replacement and we discussed that glipizide and Actos are conducive to weight gain.  I suggested a GLP-1 receptor agonist and she is open to the idea is 1 of her friends takes [place].  We discussed about the benefits and possible side effects.  We will started a low dose and advance as tolerated.  I gave her a coupon card for Ozempic.  As we are increasing Ozempic I advised her to decrease Actos and glipizide to half the current doses.  If the sugars improved significantly on Ozempic, I advised her to stop Actos completely. - I suggested to:  Patient Instructions  Please continue: - Metformin ER 2000 mg after dinner  Please decrease: - Glipizide ER to 5 mg before b'fast - Actos to 15 mg with dinner  Please start Ozempic 0.25 mg weekly in a.m. (for example on Sunday morning) x 4 weeks, then increase to 0.5 mg weekly in a.m. if no nausea or hypoglycemia.  Please let me know if the sugars are consistently <80 or >200.  Please return in 3 months with your sugar log.   - Strongly advised her to start checking sugars at different times of the day - check 1-2x a day, rotating checks - given sugar log and advised how to fill it and to bring it at next appt  - given foot care handout and explained the principles  - given instructions for hypoglycemia management "15-15 rule"  - advised for yearly eye exams  - Return to clinic in 3 mo with sugar log   Philemon Kingdom, MD PhD [facility] Endocrinology

## 2018-05-03 NOTE — Addendum Note (Signed)
Addended by: Cardell Peach I on: 05/03/2018 04:36 PM   Modules accepted: Orders

## 2018-05-03 NOTE — Patient Instructions (Addendum)
Please continue: - Metformin ER 2000 mg after dinner  Please decrease: - Glipizide ER to 5 mg before b'fast - Actos to 15 mg with dinner  Please start Ozempic 0.25 mg weekly in a.m. (for example on Sunday morning) x 4 weeks, then increase to 0.5 mg weekly in a.m. if no nausea or hypoglycemia.  Please let me know if the sugars are consistently <80 or >200.  Please return in 3 months with your sugar log.   PATIENT INSTRUCTIONS FOR TYPE 2 DIABETES:  DIET AND EXERCISE Diet and exercise is an important part of diabetic treatment.  We recommended aerobic exercise in the form of brisk walking (working between 40-60% of maximal aerobic capacity, similar to brisk walking) for 150 minutes per week (such as 30 minutes five days per week) along with 3 times per week performing 'resistance' training (using various gauge rubber tubes with handles) 5-10 exercises involving the major muscle groups (upper body, lower body and core) performing 10-15 repetitions (or near fatigue) each exercise. Start at half the above goal but build slowly to reach the above goals. If limited by weight, joint pain, or disability, we recommend daily walking in a swimming pool with water up to waist to reduce pressure from joints while allow for adequate exercise.    BLOOD GLUCOSES Monitoring your blood glucoses is important for continued management of your diabetes. Please check your blood glucoses 2-4 times a day: fasting, before meals and at bedtime (you can rotate these measurements - e.g. one day check before the 3 meals, the next day check before 2 of the meals and before bedtime, etc.).   HYPOGLYCEMIA (low blood sugar) Hypoglycemia is usually a reaction to not eating, exercising, or taking too much insulin/ other diabetes drugs.  Symptoms include tremors, sweating, hunger, confusion, headache, etc. Treat IMMEDIATELY with 15 grams of Carbs: . 4 glucose tablets .  cup regular juice/soda . 2 tablespoons raisins . 4  teaspoons sugar . 1 tablespoon honey Recheck blood glucose in 15 mins and repeat above if still symptomatic/blood glucose <100.  RECOMMENDATIONS TO REDUCE YOUR RISK OF DIABETIC COMPLICATIONS: * Take your prescribed MEDICATION(S) * Follow a DIABETIC diet: Complex carbs, fiber rich foods, (monounsaturated and polyunsaturated) fats * AVOID saturated/trans fats, high fat foods, >2,300 mg salt per day. * EXERCISE at least 5 times a week for 30 minutes or preferably daily.  * DO NOT SMOKE OR DRINK more than 1 drink a day. * Check your FEET every day. Do not wear tightfitting shoes. Contact us if you develop an ulcer * See your EYE doctor once a year or more if needed * Get a FLU shot once a year * Get a PNEUMONIA vaccine once before and once after age 68 years  GOALS:  * Your Hemoglobin A1c of <7%  * fasting sugars need to be <130 * after meals sugars need to be <180 (2h after you start eating) * Your Systolic BP should be 630 or lower  * Your Diastolic BP should be 80 or lower  * Your HDL (Good Cholesterol) should be 40 or higher  * Your LDL (Bad Cholesterol) should be 100 or lower. * Your Triglycerides should be 150 or lower  * Your Urine microalbumin (kidney function) should be <30 * Your Body Mass Index should be 25 or lower    Please consider the following ways to cut down carbs and fat and increase fiber and micronutrients in your diet: - substitute whole grain for white bread or  pasta - substitute brown rice for white rice - substitute 90-calorie flat bread pieces for slices of bread when possible - substitute sweet potatoes or yams for white potatoes - substitute humus for margarine - substitute tofu for cheese when possible - substitute almond or rice milk for regular milk (would not drink soy milk daily due to concern for soy estrogen influence on breast cancer risk) - substitute dark chocolate for other sweets when possible - substitute water - can add lemon or orange slices  for taste - for diet sodas (artificial sweeteners will trick your body that you can eat sweets without getting calories and will lead you to overeating and weight gain in the long run) - do not skip breakfast or other meals (this will slow down the metabolism and will result in more weight gain over time)  - can try smoothies made from fruit and almond/rice milk in am instead of regular breakfast - can also try old-fashioned (not instant) oatmeal made with almond/rice milk in am - order the dressing on the side when eating salad at a restaurant (pour less than half of the dressing on the salad) - eat as little meat as possible - can try juicing, but should not forget that juicing will get rid of the fiber, so would alternate with eating raw veg./fruits or drinking smoothies - use as little oil as possible, even when using olive oil - can dress a salad with a mix of balsamic vinegar and lemon juice, for e.g. - use agave nectar, stevia sugar, or regular sugar rather than artificial sweateners - steam or broil/roast veggies  - snack on veggies/fruit/nuts (unsalted, preferably) when possible, rather than processed foods - reduce or eliminate aspartame in diet (it is in diet sodas, chewing gum, etc) Read the labels!  Try to read Dr. Janene Harvey book: "Program for Reversing Diabetes" for other ideas for healthy eating.

## 2018-05-11 ENCOUNTER — Telehealth: Payer: Self-pay | Admitting: Internal Medicine

## 2018-05-11 MED ORDER — ONETOUCH ULTRA 2 W/DEVICE KIT: PACK | 0 refills | Status: DC

## 2018-05-11 NOTE — Telephone Encounter (Signed)
Patient has called in regards to a piece that holds her needle on her meter that she has lost. Patient is unsure about the name and also unsure if she needs a new meter. Please Advise, thanks

## 2018-05-11 NOTE — Telephone Encounter (Signed)
Notified patient of message from Dr. Gherghe, patient expressed understanding and agreement. No further questions.  

## 2018-05-11 NOTE — Telephone Encounter (Signed)
New meter sent.  Patient sent.

## 2018-05-12 ENCOUNTER — Telehealth: Payer: Self-pay | Admitting: Internal Medicine

## 2018-05-12 MED ORDER — ONETOUCH ULTRA 2 W/DEVICE KIT: PACK | 0 refills | Status: AC

## 2018-05-12 NOTE — Telephone Encounter (Signed)
[facility] called re: [facility] told [facility] they did not receive the following RX: Blood Glucose Monitoring Suppl (ONE TOUCH ULTRA 2) w/Device KIT 1 each 0 05/11/2018    Sig: Use to check blood sugar 2-3 times a day   Sent to pharmacy as: Blood Glucose Monitoring Suppl (ONE TOUCH ULTRA 2) w/Device Kit   E-Prescribing Status: Receipt confirmed by pharmacy (05/11/2018 4:34 PM EST)    If resending the above RX patient would like RX to be [facility] [place].

## 2018-05-12 NOTE — Telephone Encounter (Signed)
[facility] in her [facility], she did not specify otherwise when she called.  Patient tried to call to have it transferred but could not get anyone at the pharmacy on the phone, I also called and was on hold a long time.  New RX sent.

## 2018-05-24 ENCOUNTER — Encounter: Payer: Self-pay | Admitting: Internal Medicine

## 2018-05-24 ENCOUNTER — Other Ambulatory Visit: Payer: Self-pay | Admitting: Internal Medicine

## 2018-05-24 MED ORDER — PIOGLITAZONE HCL 15 MG PO TABS: 15.0000 mg | ORAL_TABLET | Freq: Every day | ORAL | 5 refills | Status: DC

## 2018-05-24 NOTE — Telephone Encounter (Signed)
Ok to send

## 2018-08-17 ENCOUNTER — Ambulatory Visit (INDEPENDENT_AMBULATORY_CARE_PROVIDER_SITE_OTHER): Payer: BC Managed Care – PPO | Admitting: Internal Medicine

## 2018-08-17 ENCOUNTER — Encounter: Payer: Self-pay | Admitting: Internal Medicine

## 2018-08-17 VITALS — BP 122/80 | HR 88 | Ht 65.0 in | Wt 261.0 lb

## 2018-08-17 DIAGNOSIS — E785 Hyperlipidemia, unspecified: Secondary | ICD-10-CM | POA: Diagnosis not present

## 2018-08-17 DIAGNOSIS — E1165 Type 2 diabetes mellitus with hyperglycemia: Secondary | ICD-10-CM

## 2018-08-17 LAB — POCT GLYCOSYLATED HEMOGLOBIN (HGB A1C): Hemoglobin A1C: 6.5 % — AB (ref 4.0–5.6)

## 2018-08-17 NOTE — Patient Instructions (Addendum)
Please continue: - Metformin ER 2000 mg after dinner - Glipizide ER 5 mg before b'fast - Ozempic 0.5 mg weekly  Try to stop Actos 15 mg.  Please return in 3-4 months with your sugar log.

## 2018-08-17 NOTE — Addendum Note (Signed)
Addended by: Cardell Peach I on: 08/17/2018 03:48 PM   Modules accepted: Orders

## 2018-08-17 NOTE — Progress Notes (Signed)
Patient ID: Barbara Jackson, female   DOB: 29-Oct-1955, 63 y.o.   MRN: [id number]   HPI: Barbara Jackson is a 63 y.o.-year-old female, returning for follow-up for DM2, dx in 2007, non-insulin-dependent, uncontrolled, without long-term complications.  Last visit 3.5 months ago.  Since last visit, she read Dr. Emilio Math book "Program for reversing diabetes" and started to include some of the suggestions into her diet.  Sugars improved.  Last hemoglobin A1c was: Lab Results  Component Value Date   HGBA1C 6.1 (A) 05/03/2018  02/22/2018: HbA1c 7.0% 07/2017: HbA1c 7.7% 07/10/2016: HbA1c 7.0%  Pt is on a regimen of: - Metformin ER 2000 mg after dinner - Glipizide XL 10 >> 5 mg before breakfast - Actos 30 >> 15  mg daily at night - Ozempic 0.5 mg weekly in am - started 04/2018 She tried Januvia, but stopped last year when she was on a new diet at that time and did not want to make any changes in her medicines.  Pt checks her sugars once a day: - am:130-169 >> 112-135, 146 - 2h after b'fast: 119, 162 >> 155, 185 - before lunch: n/c >> 101-128, 156 - 1h after lunch: 158, 202 >> 132-156, 170 - before dinner: 123, 192 >> 113-132, 155, 165 (dessert) - 2h after dinner: 176 >> 105-175 - bedtime: n/c >> 116-167 - nighttime: n/c >> 120, 133 Lowest sugar was 119 >> 105; she has hypoglycemia awareness at 70. Highest sugar was 202 >> 185.  Glucometer: One Touch Ultra 2  Patient has binge eating disorder.  She goes to the weight management center at [facility].  Pt's meals are: - Breakfast: sandwich - egg, cheese, bacon, 1/2 biscuit - Lunch: [place] sandwich, sometimes chips chicken salad - Dinner: chicken salad, hamburger + veggies, chipotle, pork chops - Snacks: 2-3 a day: fruit or chocolate She saw [name] with nutrition.  She was also seen in the binge eating clinic at [facility].  She did well recently with her binge eating disorder and lost 20 pounds in the last several  months.  She continues to exercise by swimming twice a week and using her recumbent bike 2-3 times a week  -No CKD, last BUN/creatinine:  02/22/2018: 17/0.88, GFR 65, glucose 136, with the rest of the CMP normal; ACR 6.4 07/10/2016: 20/0.83 Lab Results  Component Value Date   BUN 19 01/14/2012   BUN 14 01/13/2012   CREATININE 0.83 01/14/2012   CREATININE 0.73 01/13/2012  On losartan 100.  -+ HL;  last set of lipids: 02/22/2018: 139/109/71/46 07/10/2016: 164/122/84/32 No results found for: CHOL, HDL, LDLCALC, LDLDIRECT, TRIG, CHOLHDL  On Lipitor 40.  - last eye exam was in 05/2018: No DR  - no numbness and tingling in her feet.  Pt has FH of DM in MGM.  She is also on Cymbalta for depression.  She also has HTN.  ROS: Constitutional: no weight gain/ + weight loss, no fatigue, no subjective hyperthermia, no subjective hypothermia Eyes: no blurry vision, no xerophthalmia ENT: no sore throat, no nodules palpated in neck, no dysphagia, no odynophagia, no hoarseness Cardiovascular: no CP/no SOB/no palpitations/no leg swelling Respiratory: no cough/no SOB/no wheezing Gastrointestinal: no N/no V/no D/no C/no acid reflux Musculoskeletal: no muscle aches/no joint aches Skin: no rashes, no hair loss Neurological: no tremors/no numbness/no tingling/no dizziness  I reviewed pt's medications, allergies, PMH, social hx, family hx, and changes were documented in the history of present illness. Otherwise, unchanged from my initial visit note.  Past  Medical History:  Diagnosis Date  . Abnormal pap 1998   Negative Hpv  . Anxiety   . Arthritis   . Diabetes mellitus   . Dysrhythmia    hx of atrial tachycardia and pacs  . Esophageal reflux   . H/O hiatal hernia   . H/O iron deficiency anemia 08/2016  . Hypercholesteremia   . Hypertension   . Obesity    compulsive overeater  . Osteoarthritis    Past Surgical History:  Procedure Laterality Date  . APPENDECTOMY  1975  . TOTAL KNEE  ARTHROPLASTY  01/12/2012   Procedure: TOTAL KNEE ARTHROPLASTY;  Surgeon: Gearlean Alf, MD;  Location: [facility];  Service: Orthopedics;  Laterality: Left;   Social History   Socioeconomic History  . Marital status: Divorced    Spouse name: Not on file  . Number of children: 1  . Years of education: Not on file  . 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 Smoking status: Never Smoker  . Smokeless tobacco: Never Used  Substance and Sexual Activity  . Alcohol use: Yes    Alcohol/week:  Wine, cocktails    Types: 1-2 Standard drinks or equivalent per week  . Drug use: No  . 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     Highest level of education:  One year graduate school   Current Outpatient Medications on File Prior to Visit  Medication Sig Dispense Refill  . albuterol (PROAIR HFA) 108 (90 Base) MCG/ACT inhaler daily as needed.    Marland Kitchen atorvastatin (LIPITOR) 40 MG tablet Take 40 mg by mouth daily.  0  . Blood Glucose Monitoring Suppl (ONE TOUCH ULTRA 2) w/Device KIT Use to check blood sugar 2-3 times a day 1 each 0  . cetirizine (ZYRTEC) 10 MG tablet Take 10 mg by mouth daily.    . diphenhydrAMINE (BENADRYL ALLERGY) 25 mg capsule Take 25 mg by mouth every 6 (six) hours as needed.    . etodolac (LODINE) 400 MG tablet   1  . fluticasone (FLONASE) 50 MCG/ACT nasal spray Place into both nostrils as needed.     Marland Kitchen FREESTYLE TEST STRIPS test strip   5  . glipiZIDE (GLUCOTROL XL) 5 MG 24 hr tablet Take 1 tablet (5 mg total) by mouth daily. 90 tablet 3  . Iron-FA-B Cmp-C-Biot-Probiotic (FUSION PLUS) CAPS daily.    Marland Kitchen LORazepam (ATIVAN) 1 MG tablet Take 1 mg by mouth daily as needed for anxiety.    Marland Kitchen losartan-hydrochlorothiazide (HYZAAR) 100-25 MG per tablet Take 1 tablet by  mouth every morning.    . metFORMIN (GLUCOPHAGE-XR) 500 MG 24 hr tablet Take 2,000 mg by mouth daily with supper.    . Multiple Vitamins-Minerals (MULTIVITAMIN PO) Take by mouth.    Marland Kitchen omeprazole (PRILOSEC OTC) 20 MG tablet Take 20 mg by mouth as needed.     Marland Kitchen oxybutynin (DITROPAN) 5 MG tablet Take 1 tablet (5 mg total) by mouth 3 (three) times daily. 90 tablet 12  . pioglitazone (ACTOS) 15 MG tablet Take 1 tablet (15 mg total) by mouth daily. 60 tablet 5  . potassium chloride (K-DUR,KLOR-CON) 10 MEQ tablet Take 10 mEq by mouth daily.    . Semaglutide,0.25 or 0.5MG/DOS, (OZEMPIC, 0.25 OR 0.5 MG/DOSE,)  2 MG/1.5ML SOPN Inject 0.5 mg into the skin once a week. 2 pen 5  . zolpidem (AMBIEN) 10 MG tablet Take 10 mg by mouth at bedtime as needed. sleep     No current facility-administered medications on file prior to visit.    Allergies  Allergen Reactions  . Diclofenac Other (See Comments)    GAS  . Glucotrol [Glipizide]   . Lisinopril Cough  . Adhesive [Tape] Rash   Family History  Problem Relation Age of Onset  . Heart attack Father        Deceased, 57  . COPD Father   . Breast cancer Mother 47       Deceased, 1  . Obesity Sister   . Hypertension Brother   . Healthy Son     PE: BP 122/80   Pulse 88   Ht 5' 5" (1.651 m) Comment: measured  Wt 261 lb (118.4 kg)   LMP 06/16/2005   SpO2 95%   BMI 43.43 kg/m  Wt Readings from Last 3 Encounters:  08/17/18 261 lb (118.4 kg)  05/03/18 268 lb (121.6 kg)  12/24/17 276 lb (125.2 kg)   Constitutional: overweight, in NAD Eyes: PERRLA, EOMI, no exophthalmos ENT: moist mucous membranes, no thyromegaly, no cervical lymphadenopathy Cardiovascular: RRR, No MRG Respiratory: CTA B Gastrointestinal: abdomen soft, NT, ND, BS+ Musculoskeletal: no deformities, strength intact in all 4 Skin: moist, warm, no rashes Neurological: no tremor with outstretched hands, DTR normal in all 4  ASSESSMENT: 1. DM2, non-insulin-dependent, uncontrolled,  without long-term complications, but with hyperglycemia  2. HL  3. Obesity  PLAN:  1. Patient with longstanding, uncontrolled diabetes, on oral antidiabetic regimen, and now also on weekly GLP-1 receptor agonist, added at last visit.  At that time, she was pressed to lose weight to have a total knee replacement.  We discussed that glipizide and Actos were both conducive to weight gain and we reduced the doses of both at last visit.  At that time, her HbA1c was better, at 6.1%. -At this visit, sugars have improved and they are mostly at goal.  At this point, we decided to continue her current regimen except I advised her to try to stop Actos.  Otherwise, I encouraged her to continue with a plant-based diet and also continue her exercise regimen.  I congratulated her for the 7 pound weight loss from last visit. -At next visit, will consider increasing Ozempic, which she tolerates well, and possibly even stopping glipizide. - I suggested to:  Patient Instructions  Please continue: - Metformin ER 2000 mg after dinner - Glipizide ER 5 mg before b'fast - Ozempic 0.5 mg weekly  Try to stop Actos 15 mg.  Please return in 3-4 months with your sugar log.   - today, HbA1c is 6.5% (higher) - continue checking sugars at different times of the day - check 1x a day, rotating checks - advised for yearly eye exams >> she is UTD - Return to clinic in 3 mo with sugar log   2. HL - Reviewed latest lipid panel from 02/2018: all fractions at goal - Continues Lipitor without side effects.  3. Obesity  - seen at [facility] for BED -At last visit we decrease glipizide and Actos and started Ozempic, all measures designed to help her lose weight -She lost another 7 pounds since last visit.  Philemon Kingdom, MD PhD [facility]

## 2018-08-19 ENCOUNTER — Encounter: Payer: Self-pay | Admitting: Internal Medicine

## 2018-09-03 ENCOUNTER — Encounter: Payer: Self-pay | Admitting: Internal Medicine

## 2018-09-03 MED ORDER — SEMAGLUTIDE(0.25 OR 0.5MG/DOS) 2 MG/1.5ML ~~LOC~~ SOPN: 0.5000 mg | PEN_INJECTOR | SUBCUTANEOUS | 5 refills | Status: DC

## 2018-09-16 ENCOUNTER — Ambulatory Visit (INDEPENDENT_AMBULATORY_CARE_PROVIDER_SITE_OTHER): Payer: BC Managed Care – PPO | Admitting: Internal Medicine

## 2018-09-16 ENCOUNTER — Encounter: Payer: Self-pay | Admitting: Internal Medicine

## 2018-09-16 ENCOUNTER — Telehealth: Payer: Self-pay | Admitting: Internal Medicine

## 2018-09-16 DIAGNOSIS — E669 Obesity, unspecified: Secondary | ICD-10-CM

## 2018-09-16 DIAGNOSIS — E785 Hyperlipidemia, unspecified: Secondary | ICD-10-CM | POA: Diagnosis not present

## 2018-09-16 DIAGNOSIS — E1165 Type 2 diabetes mellitus with hyperglycemia: Secondary | ICD-10-CM | POA: Diagnosis not present

## 2018-09-16 NOTE — Progress Notes (Signed)
Patient ID: Barbara Jackson, female   DOB: [birth year], 63 y.o.   MRN: [id number]   Patient location: Home My location: Office  Referring Provider: Darcus Austin, MD (Inactive)  I connected with the patient on 09/16/18 at  1:47 PM EDT by a video enabled telemedicine application and verified that I am speaking with the correct person.   I discussed the limitations of evaluation and management by telemedicine and the availability of in person appointments. The patient expressed understanding and agreed to proceed.   Details of the encounter are shown below.  HPI: Barbara Jackson is a 63 y.o.-year-old female, returning for follow-up for DM2, dx in 2007, non-insulin-dependent, uncontrolled, without long-term complications.  Last visit 1 month ago.  She read Dr. Emilio Math book "Program for reversing diabetes" and started to include some of the suggestions into her diet.  Sugars improved afterwards, and her HbA1c dropped to 6.1%.  At last visit, this was slightly higher, but still at goal.  Last hemoglobin A1c was: Lab Results  Component Value Date   HGBA1C 6.5 (A) 08/17/2018   HGBA1C 6.1 (A) 05/03/2018  02/22/2018: HbA1c 7.0% 07/2017: HbA1c 7.7% 07/10/2016: HbA1c 7.0%  Pt is on a regimen of: - Metformin ER 2000 mg after dinner - Glipizide XL 10 >> 5 mg before breakfast - Ozempic 0.5 mg weekly in am - started 04/2018 We stopped Actos 08/17/2018. She tried Januvia, but stopped last year when she was on a new diet at that time and did not want to make any changes in her medicines.  Pt checks her sugars 1-2 times a day - am:130-169 >> 112-135, 146 >> 131-161 - 2h after b'fast: 119, 162 >> 155, 185 >> 135-176 - before lunch: n/c >> 101-128, 156 >> 95-157 - 1h after lunch: 158, 202 >> 132-156, 170 >> 218 (forgot glipizide in am) - before dinner: 123, 192 >> 113-132, 155, 165 >> 110-161 - 2h after dinner: 176 >> 105-175 >> 123, 136 - bedtime: n/c >> 116-167 >> 155-169 (dessert) -  nighttime: n/c >> 120, 133 >> n/c Lowest sugar was 119 >> 105 >> 95; she has hypoglycemia awareness in the 70s. Highest sugar was 202 >> 185 >> 218.  Glucometer: One Touch Ultra 2  Pt's meals are: - Breakfast: sandwich - egg, cheese, bacon, 1/2 biscuit - Lunch: [place] sandwich, sometimes chips chicken salad - Dinner: chicken salad, hamburger + veggies, chipotle, pork chops - Snacks: 2-3 a day: fruit or chocolate She saw [name] with nutrition.  She is also seen in the binge eating clinic at [facility].    At last visit she was swimming twice a week, which she stopped with the coronavirus pandemic.  She is still using her recumbent bike frequently.  -No CKD, last BUN/creatinine:  02/22/2018: 17/0.88, GFR 65, glucose 136, with the rest of the CMP normal; ACR 6.4 07/10/2016: 20/0.83 Lab Results  Component Value Date   BUN 19 01/14/2012   BUN 14 01/13/2012   CREATININE 0.83 01/14/2012   CREATININE 0.73 01/13/2012  On losartan 100.  -+ HL;  last set of lipids: 02/22/2018: 139/109/71/46 07/10/2016: 164/122/84/32 No results found for: CHOL, HDL, LDLCALC, LDLDIRECT, TRIG, CHOLHDL  On Lipitor 40  - last eye exam was in 05/2018: No DR  -No numbness and tingling in her feet.  Pt has FH of DM in MGM.  She is also on Cymbalta for depression.  She also has a history of hypertension.  ROS: Constitutional: no weight gain/+ weight loss, no  fatigue, no subjective hyperthermia, no subjective hypothermia Eyes: no blurry vision, no xerophthalmia ENT: no sore throat, no nodules palpated in neck, no dysphagia, no odynophagia, no hoarseness Cardiovascular: no CP/no SOB/no palpitations/no leg swelling Respiratory: no cough/no SOB/no wheezing Gastrointestinal: no N/no V/no D/no C/no acid reflux Musculoskeletal: no muscle aches/no joint aches Skin: no rashes, no hair loss Neurological: no tremors/no numbness/no tingling/no dizziness  I reviewed pt's medications, allergies, PMH, social hx,  family hx, and changes were documented in the history of present illness. Otherwise, unchanged from my initial visit note.  Past Medical History:  Diagnosis Date  . Abnormal pap 1998   Negative Hpv  . Anxiety   . Arthritis   . Diabetes mellitus   . Dysrhythmia    hx of atrial tachycardia and pacs  . Esophageal reflux   . H/O hiatal hernia   . H/O iron deficiency anemia 08/2016  . Hypercholesteremia   . Hypertension   . Obesity    compulsive overeater  . Osteoarthritis    Past Surgical History:  Procedure Laterality Date  . APPENDECTOMY  1975  . TOTAL KNEE ARTHROPLASTY  01/12/2012   Procedure: TOTAL KNEE ARTHROPLASTY;  Surgeon: Gearlean Alf, MD;  Location: [facility];  Service: Orthopedics;  Laterality: Left;   Social History   Socioeconomic History  . Marital status: Divorced    Spouse name: Not on file  . Number of children: 1  . Years of education: Not on file  . 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 Smoking status: Never Smoker  . Smokeless tobacco: Never Used  Substance and Sexual Activity  . Alcohol use: Yes    Alcohol/week:  Wine, cocktails    Types: 1-2 Standard drinks or equivalent per week  . Drug use: No  . 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     Highest level of education:  One year graduate school   Current Outpatient Medications on File Prior to Visit  Medication Sig Dispense Refill  . albuterol (PROAIR HFA) 108 (90 Base) MCG/ACT inhaler daily as needed.    Marland Kitchen atorvastatin (LIPITOR) 40 MG tablet Take 40 mg by mouth daily.  0  . Blood Glucose Monitoring Suppl (ONE TOUCH ULTRA 2) w/Device KIT Use to check blood sugar 2-3 times a day 1 each 0  . cetirizine (ZYRTEC) 10 MG tablet Take 10 mg by mouth daily.    . diphenhydrAMINE  (BENADRYL ALLERGY) 25 mg capsule Take 25 mg by mouth every 6 (six) hours as needed.    . etodolac (LODINE) 400 MG tablet   1  . fluticasone (FLONASE) 50 MCG/ACT nasal spray Place into both nostrils as needed.     Marland Kitchen FREESTYLE TEST STRIPS test strip   5  . glipiZIDE (GLUCOTROL XL) 5 MG 24 hr tablet Take 1 tablet (5 mg total) by mouth daily. 90 tablet 3  . Iron-FA-B Cmp-C-Biot-Probiotic (FUSION PLUS) CAPS daily.    Marland Kitchen LORazepam (ATIVAN) 1 MG tablet Take 1 mg by mouth daily as needed for anxiety.    Marland Kitchen losartan-hydrochlorothiazide (HYZAAR) 100-25 MG per tablet Take 1 tablet by mouth every morning.    . metFORMIN (GLUCOPHAGE-XR) 500 MG 24 hr tablet Take 2,000 mg by mouth daily with supper.    Marland Kitchen  Multiple Vitamins-Minerals (MULTIVITAMIN PO) Take by mouth.    Marland Kitchen omeprazole (PRILOSEC OTC) 20 MG tablet Take 20 mg by mouth as needed.     Marland Kitchen oxybutynin (DITROPAN) 5 MG tablet Take 1 tablet (5 mg total) by mouth 3 (three) times daily. 90 tablet 12  . pioglitazone (ACTOS) 15 MG tablet Take 1 tablet (15 mg total) by mouth daily. 60 tablet 5  . potassium chloride (K-DUR,KLOR-CON) 10 MEQ tablet Take 10 mEq by mouth daily.    . Semaglutide,0.25 or 0.5MG/DOS, (OZEMPIC, 0.25 OR 0.5 MG/DOSE,) 2 MG/1.5ML SOPN Inject 0.5 mg into the skin once a week. 6 pen 5  . zolpidem (AMBIEN) 10 MG tablet Take 10 mg by mouth at bedtime as needed. sleep     No current facility-administered medications on file prior to visit.    Allergies  Allergen Reactions  . Diclofenac Other (See Comments)    GAS  . Glucotrol [Glipizide]   . Lisinopril Cough  . Adhesive [Tape] Rash   Family History  Problem Relation Age of Onset  . Heart attack Father        Deceased, 62  . COPD Father   . Breast cancer Mother 66       Deceased, 91  . Obesity Sister   . Hypertension Brother   . Healthy Son     PE: LMP 06/16/2005  Wt Readings from Last 3 Encounters:  08/17/18 261 lb (118.4 kg)  05/03/18 268 lb (121.6 kg)  12/24/17 276 lb (125.2  kg)   Constitutional:  in NAD  The physical exam was not performed (virtual visit).  ASSESSMENT: 1. DM2, non-insulin-dependent, uncontrolled, without long-term complications, but with hyperglycemia  2. HL  3. Obesity  PLAN:  1. Patient with longstanding, uncontrolled, type 2 diabetes, on oral antidiabetic regimen and weekly GLP-1 receptor agonist.  At last visit, HbA1c was still at goal, at 6.5%, but higher.  States she was trying hard to lose weight to qualify for knee replacement surgery, and I advised her to stop Actos.  We continued Ozempic, which she is tolerating well.  At last visit we discussed about the possibility of maybe increasing the Ozempic dose in the near future. -She continues to lose weight, blood sugars are slightly higher. -I discussed with the patient about possible options for treatment, to include increasing the Ozempic dose or adding an SGLT2 inhibitor.  I described benefits and possible side effects from SGLT2 inhibitors and she would like to hold off adding one for now.  She feels that her sugars may be higher because of the stress of the coronavirus pandemic.  Therefore, for now, we will continue the current regimen and I advised her to send me her sugars again in 2 weeks.  At that time, we may need to intensify her treatment - I suggested to:  Patient Instructions  Please continue: - Metformin ER 2000 mg after dinner - Glipizide ER 5 mg before b'fast - Ozempic 0.5 mg weekly  Please let me know about the sugars in 2 weeks.  Please return in 3 months with your sugar log.   -We will check her HbA1c when she returns to the clinic - continue checking sugars at different times of the day - check 1x a day, rotating checks - advised for yearly eye exams >> she is UTD - Return to clinic in 3 mo with sugar log   2. HL - Reviewed latest lipid panel from 02/2018: all fxns at goal - Continues Lipitor without side  effects.  3. Obesity  -she is seen at [facility]  for BED -Before last visit, she lost another 7 pounds -At last visit I advised her to stop Actos to help more with weight loss >> lost 8 lbs more! -Continue Ozempic, which should also help with weight loss  Philemon Kingdom, MD PhD [facility] Endocrinology

## 2018-09-16 NOTE — Telephone Encounter (Signed)
Pt is set for webex at 130 today

## 2018-09-16 NOTE — Patient Instructions (Signed)
Please continue: - Metformin ER 2000 mg after dinner - Glipizide ER 5 mg before b'fast - Ozempic 0.5 mg weekly  Please let me know about the sugars in 2 weeks.  Please return in 3 months with your sugar log.

## 2018-09-21 ENCOUNTER — Encounter: Payer: Self-pay | Admitting: Internal Medicine

## 2018-09-22 MED ORDER — METFORMIN HCL ER 500 MG PO TB24: 2000.0000 mg | ORAL_TABLET | Freq: Every day | ORAL | 2 refills | Status: DC

## 2018-09-27 ENCOUNTER — Other Ambulatory Visit: Payer: Self-pay

## 2018-09-27 ENCOUNTER — Encounter: Payer: Self-pay | Admitting: Podiatry

## 2018-09-27 ENCOUNTER — Ambulatory Visit: Payer: BC Managed Care – PPO | Admitting: Podiatry

## 2018-09-27 VITALS — Temp 96.6°F

## 2018-09-27 DIAGNOSIS — M6740 Ganglion, unspecified site: Secondary | ICD-10-CM

## 2018-09-27 NOTE — Progress Notes (Signed)
Subjective:   Patient ID: Barbara Jackson, female   DOB: 63 y.o.   MRN: [id number]   HPI Patient presents stating that she has developed a mass on her left third toe that has grown in size and she is not sure when it started but she is concerned because she is a diabetic.  Is under good control with A1c is 6.5 and patient does not smoke and likes to be active   Review of Systems  All other systems reviewed and are negative.       Objective:  Physical Exam Vitals signs and nursing note reviewed.  Constitutional:      Appearance: She is well-developed.  Pulmonary:     Effort: Pulmonary effort is normal.  Musculoskeletal: Normal range of motion.  Skin:    General: Skin is warm.  Neurological:     Mental Status: She is alert.     Neurovascular status intact muscle strength is adequate range of motion within normal limits with patient found to have mass third digit distal to phalangeal joint left lateral side that measures about 5 x 5 mm and appears to have fluid with it.  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

## 2018-10-05 ENCOUNTER — Encounter: Payer: Self-pay | Admitting: Internal Medicine

## 2018-10-15 ENCOUNTER — Encounter: Payer: Self-pay | Admitting: Podiatry

## 2018-10-19 ENCOUNTER — Encounter: Payer: Self-pay | Admitting: Internal Medicine

## 2018-11-09 ENCOUNTER — Other Ambulatory Visit: Payer: Self-pay | Admitting: Internal Medicine

## 2018-11-09 ENCOUNTER — Encounter: Payer: Self-pay | Admitting: Internal Medicine

## 2018-11-09 MED ORDER — SEMAGLUTIDE (1 MG/DOSE) 2 MG/1.5ML ~~LOC~~ SOPN: 1.0000 mg | PEN_INJECTOR | SUBCUTANEOUS | 3 refills | Status: DC

## 2018-12-20 ENCOUNTER — Encounter: Payer: Self-pay | Admitting: Internal Medicine

## 2018-12-20 ENCOUNTER — Other Ambulatory Visit: Payer: Self-pay

## 2018-12-20 ENCOUNTER — Ambulatory Visit (INDEPENDENT_AMBULATORY_CARE_PROVIDER_SITE_OTHER): Payer: BC Managed Care – PPO | Admitting: Internal Medicine

## 2018-12-20 VITALS — BP 120/88 | HR 85 | Ht 65.0 in | Wt 243.0 lb

## 2018-12-20 DIAGNOSIS — E1165 Type 2 diabetes mellitus with hyperglycemia: Secondary | ICD-10-CM

## 2018-12-20 DIAGNOSIS — E669 Obesity, unspecified: Secondary | ICD-10-CM

## 2018-12-20 DIAGNOSIS — E785 Hyperlipidemia, unspecified: Secondary | ICD-10-CM

## 2018-12-20 LAB — POCT GLYCOSYLATED HEMOGLOBIN (HGB A1C): Hemoglobin A1C: 6.2 % — AB (ref 4.0–5.6)

## 2018-12-20 NOTE — Addendum Note (Signed)
Addended by: Cardell Peach I on: 12/20/2018 03:49 PM   Modules accepted: Orders

## 2018-12-20 NOTE — Patient Instructions (Addendum)
Please continue: - Metformin ER 2000 mg after dinner - Glipizide ER 5 mg before b'fast - Ozempic 1 mg weekly  Please return in 4 months with your sugar log.

## 2018-12-20 NOTE — Progress Notes (Signed)
Patient ID: Barbara Jackson, female   DOB: [birth year], 63 y.o.   MRN: [id number]   HPI: Barbara Jackson is a 63 y.o.-year-old female, returning for follow-up for DM2, dx in 2007, non-insulin-dependent, uncontrolled, without long-term complications.  Last visit 3 mo ago (virtual).  She is preparing for total knee replacement surgery at the end of the month.  She lost 18 pounds since last visit in preparation for the surgery.  Sugars are better.  Last hemoglobin A1c was: Lab Results  Component Value Date   HGBA1C 6.5 (A) 08/17/2018   HGBA1C 6.1 (A) 05/03/2018  02/22/2018: HbA1c 7.0% 07/2017: HbA1c 7.7% 07/10/2016: HbA1c 7.0%  Pt is on a regimen of: - Metformin ER 2000 mg after dinner - Glipizide XL 10 >> 5 mg before breakfast - Ozempic 0.5 >> 1 mg weekly in am - started 04/2018 We stopped Actos 08/17/2018. She tried Januvia, but stopped last year when she was on a new diet at that time and did not want to make any changes in her medicines.  Pt checks her sugars 1-2 times a day: - am:130-169 >> 112-135, 146 >> 131-161 >> 112-143 - 2h after b'fast: 119, 162 >> 155, 185 >> 135-176 >> 119-159 - before lunch: n/c >> 101-128, 156 >> 95-157 >> 97-135 - 1h after lunch: 158, 202 >> 132-156, 170 >> 218 >> n/c - before dinner: 113-132, 155, 165 >> 110-161 >> 115-133, 148 - 2h after dinner: 176 >> 105-175 >> 123, 136 >> 120-158 - bedtime: n/c >> 116-167 >> 155-169 (dessert) >> 110, 129 - nighttime: n/c >> 120, 133 >> n/c Lowest sugar was 119 >> 105 >> 95 >> 97; she has hypoglycemia awareness in the 70s. Highest sugar was 202 >> 185 >> 218 >> 159.  Glucometer: One Touch Ultra 2  In the past, she read Dr. Janene Harvey program for reversing diabetes program started to adopt the changes suggested in the log.  Her sugars started to improve significantly.   She also saw [name] with nutrition in the past.  She is currently being seen in the binge eating clinic at [facility].    In the  past, was swimming twice a week, which she stopped with the coronavirus pandemic.  She continues to use her recumbent bike.  -No CKD, last BUN/creatinine:  02/22/2018: 17/0.88, GFR 65, glucose 136, with the rest of the CMP normal; ACR 6.4 07/10/2016: 20/0.83 Lab Results  Component Value Date   BUN 19 01/14/2012   BUN 14 01/13/2012   CREATININE 0.83 01/14/2012   CREATININE 0.73 01/13/2012  On losartan 100.  -+ HL;  last set of lipids: 02/22/2018: 139/109/71/46 07/10/2016: 164/122/84/32 No results found for: CHOL, HDL, LDLCALC, LDLDIRECT, TRIG, CHOLHDL  On Lipitor 40.  - last eye exam was in 05/2018: No DR  - no numbness and tingling in her feet.  Pt has FH of DM in [place].  She is also on Cymbalta for depression.  She has a history of hypertension.  ROS: Constitutional: no weight gain/+ weight loss, no fatigue, no subjective hyperthermia, no subjective hypothermia Eyes: no blurry vision, no xerophthalmia ENT: no sore throat, no nodules palpated in neck, no dysphagia, no odynophagia, no hoarseness Cardiovascular: no CP/no SOB/no palpitations/no leg swelling Respiratory: no cough/no SOB/no wheezing Gastrointestinal: no N/no V/no D/no C/no acid reflux Musculoskeletal: no muscle aches/no joint aches Skin: no rashes, no hair loss Neurological: no tremors/no numbness/no tingling/no dizziness  I reviewed pt's medications, allergies, PMH, social hx, family hx, and  changes were documented in the history of present illness. Otherwise, unchanged from my initial visit note.  Past Medical History:  Diagnosis Date  . Abnormal pap 1998   Negative Hpv  . Anxiety   . Arthritis   . Diabetes mellitus   . Dysrhythmia    hx of atrial tachycardia and pacs  . Esophageal reflux   . H/O hiatal hernia   . H/O iron deficiency anemia 08/2016  . Hypercholesteremia   . Hypertension   . Obesity    compulsive overeater  . Osteoarthritis    Past Surgical History:  Procedure Laterality Date  .  APPENDECTOMY  1975  . TOTAL KNEE ARTHROPLASTY  01/12/2012   Procedure: TOTAL KNEE ARTHROPLASTY;  Surgeon: Gearlean Alf, MD;  Location: [facility];  Service: Orthopedics;  Laterality: Left;   Social History   Socioeconomic History  . Marital status: Divorced    Spouse name: Not on file  . Number of children: 1  . Years of education: Not on file  . 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 Smoking status: Never Smoker  . Smokeless tobacco: Never Used  Substance and Sexual Activity  . Alcohol use: Yes    Alcohol/week:  Wine, cocktails    Types: 1-2 Standard drinks or equivalent per week  . Drug use: No  . 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     Highest level of education:  One year graduate school   Current Outpatient Medications on File Prior to Visit  Medication Sig Dispense Refill  . albuterol (PROAIR HFA) 108 (90 Base) MCG/ACT inhaler daily as needed.    Marland Kitchen atorvastatin (LIPITOR) 40 MG tablet Take 40 mg by mouth daily.  0  . Blood Glucose Monitoring Suppl (ONE TOUCH ULTRA 2) w/Device KIT Use to check blood sugar 2-3 times a day 1 each 0  . cetirizine (ZYRTEC) 10 MG tablet Take 10 mg by mouth daily.    . diphenhydrAMINE (BENADRYL ALLERGY) 25 mg capsule Take 25 mg by mouth every 6 (six) hours as needed.    . fluticasone (FLONASE) 50 MCG/ACT nasal spray Place into both nostrils as needed.     Marland Kitchen FREESTYLE TEST STRIPS test strip   5  . glipiZIDE (GLUCOTROL XL) 5 MG 24 hr tablet Take 1 tablet (5 mg total) by mouth daily. 90 tablet 3  . Iron-FA-B Cmp-C-Biot-Probiotic (FUSION PLUS) CAPS daily.    Marland Kitchen losartan-hydrochlorothiazide (HYZAAR) 100-25 MG per tablet Take 1 tablet by mouth every morning.    . metFORMIN (GLUCOPHAGE-XR) 500 MG 24 hr tablet Take 4 tablets  (2,000 mg total) by mouth daily with supper. 360 tablet 2  . Multiple Vitamins-Minerals (MULTIVITAMIN PO) Take by mouth.    Marland Kitchen omeprazole (PRILOSEC OTC) 20 MG tablet Take 20 mg by mouth as needed.     Marland Kitchen oxybutynin (DITROPAN) 5 MG tablet Take 1 tablet (5 mg total) by mouth 3 (three) times daily. 90 tablet 12  . potassium chloride (K-DUR,KLOR-CON) 10 MEQ tablet Take 10 mEq by mouth daily.    . Semaglutide, 1 MG/DOSE, 2 MG/1.5ML SOPN Inject 1 mg into the skin once a week. 6 pen 3  . zolpidem (AMBIEN) 10 MG tablet Take 10 mg by mouth at bedtime as needed.  sleep     No current facility-administered medications on file prior to visit.    Allergies  Allergen Reactions  . Diclofenac Other (See Comments)    GAS  . Glucotrol [Glipizide]   . Lisinopril Cough  . Adhesive [Tape] Rash   Family History  Problem Relation Age of Onset  . Heart attack Father        Deceased, 61  . COPD Father   . Breast cancer Mother 39       Deceased, 47  . Obesity Sister   . Hypertension Brother   . Healthy Son     PE: BP 120/88   Pulse 85   Ht '5\' 5"'  (1.651 m)   Wt 243 lb (110.2 kg)   LMP 06/16/2005   SpO2 97%   BMI 40.44 kg/m  Wt Readings from Last 3 Encounters:  12/20/18 243 lb (110.2 kg)  08/17/18 261 lb (118.4 kg)  05/03/18 268 lb (121.6 kg)   Constitutional: overweight, in NAD Eyes: PERRLA, EOMI, no exophthalmos ENT: moist mucous membranes, no thyromegaly, no cervical lymphadenopathy Cardiovascular: RRR, No MRG Respiratory: CTA B Gastrointestinal: abdomen soft, NT, ND, BS+ Musculoskeletal: no deformities, strength intact in all 4 Skin: moist, warm, no rashes Neurological: no tremor with outstretched hands, DTR normal in all 4  ASSESSMENT: 1. DM2, non-insulin-dependent, uncontrolled, without long-term complications, but with hyperglycemia  2. HL  3. Obesity  PLAN:  1. Patient with longstanding, uncontrolled, type 2 diabetes, on oral antidiabetic regimen and weekly GLP-1 receptor  agonist.  At last visit, HbA1c was still at goal, at 6.5%, but higher than before.  We had a virtual appointment 1 month after our last in office visit and at that time, sugars were slightly higher.  We e discussed about the possibility of adding an SGLT2 inhibitor but she wanted to hold off on this for the moment since she felt that they were mostly related to the stress from the coronavirus pandemic. -At this visit, sugars have improved at all times of the day as she reduced her calories per day from 2000 to 1700 cal daily in an effort to lose weight in preparation for her surgery.  No changes are needed in her regimen for now and I strongly advised her to continue with the positive changes that she may be her diet.   -She is clear for surgery from the diabetes point of view. -We discussed about how to handle her diabetes medicine perioperative: Continue metformin the night before but skip glipizide in the day of the surgery.  Continue Ozempic - I suggested to:  Patient Instructions  Please continue: - Metformin ER 2000 mg after dinner - Glipizide ER 5 mg before b'fast - Ozempic 1 mg weekly  Please return in 4 months with your sugar log.   - today, HbA1c is 6.2% (improved) - continue checking sugars at different times of the day - check 1x a day, rotating checks - advised for yearly eye exams >> she is UTD - Return to clinic in 4 mo with sugar log   2. HL - Reviewed latest lipid panel from 02/2018: All fractions at goal - Continues Lipitor without side effects.  3. Obesity  -She is seen at [facility] for BED -Before last office visit, she lost a total of 15 pounds (she also stopped Actos, which was conducive to weight gain) - since last OV, she lost 18 more lbs! She is now ready for her knee surgery - scheduled for the end of  this mo -We will continue Ozempic, which should also help with weight loss  Philemon Kingdom, MD PhD [facility] Endocrinology

## 2018-12-29 NOTE — H&P (Signed)
TOTAL KNEE ADMISSION H&P  Patient is being admitted for right total knee arthroplasty.  Subjective:  Chief Complaint:right knee pain.  HPI: Barbara Jackson, 63 y.o. female, has a history of pain and functional disability in the right knee due to arthritis and has failed non-surgical conservative treatments for greater than 12 weeks to includecorticosteriod injections, viscosupplementation injections and activity modification.  Onset of symptoms was gradual, starting 2 years ago with gradually worsening course since that time. The patient noted no past surgery on the right knee(s).  Patient currently rates pain in the right knee(s) at 9 out of 10 with activity. Patient has worsening of pain with activity and weight bearing, pain that interferes with activities of daily living, crepitus and instability.  Patient has evidence of bone-on-bone arthritis in the medial and patellofemoral compartments with diffuse osteophyte formation by imaging studies. There is no active infection.  Patient Active Problem List   Diagnosis Date Noted  . Obesity with serious comorbidity 09/16/2018  . Hyperlipidemia 08/17/2018  . History of total left knee replacement 08/05/2017  . Open angle with borderline findings and low glaucoma risk in both eyes 04/27/2017  . Obstructive sleep apnea 07/08/2016  . Hypertension   . Type 2 diabetes mellitus with hyperglycemia, without long-term current use of insulin ([place])   . Primary osteoarthritis of right knee 01/12/2012   Past Medical History:  Diagnosis Date  . Abnormal pap 1998   Negative Hpv  . Anxiety   . Arthritis   . Diabetes mellitus   . Dysrhythmia    hx of atrial tachycardia and pacs  . Esophageal reflux   . H/O hiatal hernia   . H/O iron deficiency anemia 08/2016  . Hypercholesteremia   . Hypertension   . Obesity    compulsive overeater  . Osteoarthritis     Past Surgical History:  Procedure Laterality Date  . APPENDECTOMY  1975  . TOTAL KNEE  ARTHROPLASTY  01/12/2012   Procedure: TOTAL KNEE ARTHROPLASTY;  Surgeon: Gearlean Alf, MD;  Location: [facility];  Service: Orthopedics;  Laterality: Left;    No current facility-administered medications for this encounter.    Current Outpatient Medications  Medication Sig Dispense Refill Last Dose  . albuterol (PROAIR HFA) 108 (90 Base) MCG/ACT inhaler daily as needed.   Taking  . atorvastatin (LIPITOR) 40 MG tablet Take 40 mg by mouth daily.  0 Taking  . Blood Glucose Monitoring Suppl (ONE TOUCH ULTRA 2) w/Device KIT Use to check blood sugar 2-3 times a day 1 each 0 Taking  . cetirizine (ZYRTEC) 10 MG tablet Take 10 mg by mouth daily.   Taking  . diphenhydrAMINE (BENADRYL ALLERGY) 25 mg capsule Take 25 mg by mouth every 6 (six) hours as needed.   Taking  . fluticasone (FLONASE) 50 MCG/ACT nasal spray Place into both nostrils as needed.    Taking  . FREESTYLE TEST STRIPS test strip   5 Taking  . glipiZIDE (GLUCOTROL XL) 5 MG 24 hr tablet Take 1 tablet (5 mg total) by mouth daily. 90 tablet 3 Taking  . Iron-FA-B Cmp-C-Biot-Probiotic (FUSION PLUS) CAPS daily.   Taking  . losartan-hydrochlorothiazide (HYZAAR) 100-25 MG per tablet Take 1 tablet by mouth every morning.   Taking  . metFORMIN (GLUCOPHAGE-XR) 500 MG 24 hr tablet Take 4 tablets (2,000 mg total) by mouth daily with supper. 360 tablet 2 Taking  . Multiple Vitamins-Minerals (MULTIVITAMIN PO) Take by mouth.   Taking  . omeprazole (PRILOSEC OTC) 20 MG tablet Take  20 mg by mouth as needed.    Taking  . oxybutynin (DITROPAN) 5 MG tablet Take 1 tablet (5 mg total) by mouth 3 (three) times daily. 90 tablet 12 Taking  . potassium chloride (K-DUR,KLOR-CON) 10 MEQ tablet Take 10 mEq by mouth daily.   Taking  . Semaglutide, 1 MG/DOSE, 2 MG/1.5ML SOPN Inject 1 mg into the skin once a week. 6 pen 3 Taking  . zolpidem (AMBIEN) 10 MG tablet Take 10 mg by mouth at bedtime as needed. sleep   Taking   Allergies  Allergen Reactions  . Diclofenac Other  (See Comments)    GAS  . Glucotrol [Glipizide]   . Lisinopril Cough  . Adhesive [Tape] Rash    Social History   Tobacco Use  . Smoking status: Never Smoker  . Smokeless tobacco: Never Used  Substance Use Topics  . Alcohol use: Yes    Alcohol/week: 2.0 standard drinks    Types: 2 Standard drinks or equivalent per week    Family History  Problem Relation Age of Onset  . Heart attack Father        Deceased, 81  . COPD Father   . Breast cancer Mother 59       Deceased, 65  . Obesity Sister   . Hypertension Brother   . Healthy Son      Review of Systems  Constitutional: Negative for chills and fever.  HENT: Negative for congestion, sore throat and tinnitus.   Eyes: Negative for double vision, photophobia and pain.  Respiratory: Negative for cough, shortness of breath and wheezing.   Cardiovascular: Negative for chest pain, palpitations and orthopnea.  Gastrointestinal: Negative for heartburn, nausea and vomiting.  Genitourinary: Negative for dysuria, frequency and urgency.  Musculoskeletal: Positive for joint pain.  Neurological: Negative for dizziness, weakness and headaches.    Objective:  Physical Exam  Well nourished and well developed.  General: Alert and oriented x3, cooperative and pleasant, no acute distress.  Head: normocephalic, atraumatic, neck supple.  Eyes: EOMI.  Respiratory: breath sounds clear in all fields, no wheezing, rales, or rhonchi. Cardiovascular: Regular rate and rhythm, no murmurs, gallops or rubs.  Abdomen: non-tender to palpation and soft, normoactive bowel sounds. Musculoskeletal:  Right Knee Exam: Her right knee shows no effusion She has range 5-125 with moderate crepitus She is tender medial greater then lateral with no instability Gait pattern is antalgic on the right  Calves soft and nontender. Motor function intact in LE. Strength 5/5 LE bilaterally. Neuro: Distal pulses 2+. Sensation to light touch intact in LE.  Vital signs  in last 24 hours:  Blood pressure: 120/82 mmHg Pulse: 80 bpm  Labs:   Estimated body mass index is 40.44 kg/m as calculated from the following:   Height as of 12/20/18: 5' 5" (1.651 m).   Weight as of 12/20/18: 110.2 kg.   Imaging Review Plain radiographs demonstrate severe degenerative joint disease of the right knee(s). The overall alignment isneutral. The bone quality appears to be adequate for age and reported activity level.   Assessment/Plan:  End stage arthritis, right knee   The patient history, physical examination, clinical judgment of the provider and imaging studies are consistent with end stage degenerative joint disease of the right knee(s) and total knee arthroplasty is deemed medically necessary. The treatment options including medical management, injection therapy arthroscopy and arthroplasty were discussed at length. The risks and benefits of total knee arthroplasty were presented and reviewed. The risks due to aseptic loosening, infection, stiffness,   patella tracking problems, thromboembolic complications and other imponderables were discussed. The patient acknowledged the explanation, agreed to proceed with the plan and consent was signed. Patient is being admitted for inpatient treatment for surgery, pain control, PT, OT, prophylactic antibiotics, VTE prophylaxis, progressive ambulation and ADL's and discharge planning. The patient is planning to be discharged home.  Anticipated LOS equal to or greater than 2 midnights due to - Age 85 and older with one or more of the following:  - Obesity  - Expected need for hospital services (PT, OT, Nursing) required for safe  discharge  - Anticipated need for postoperative skilled nursing care or inpatient rehab  - Active co-morbidities: Diabetes OR   - Unanticipated findings during/Post Surgery: None  - Patient is a high risk of re-admission due to: None  Therapy Plans: Outpatient therapy at [facility] Disposition: Home with  son Planned DVT Prophylaxis: Aspirin 325 mg BID DME needed: 3-in-1 PCP: London Pepper, MD TXA: IV Allergies: Adhesive tape (rash), lisinopril (cough), diclofenac (stomach irritation) Anesthesia Concerns: None BMI: 39.6 Last HgbA1c: 6.2% as of 12/20/2018  - Patient was instructed on what medications to stop prior to surgery. - Follow-up visit in 2 weeks with Dr. Wynelle Link - Begin physical therapy following surgery - Pre-operative lab work as pre-surgical testing - Prescriptions will be provided in hospital at time of discharge  Theresa Duty, PA-C Orthopedic Surgery [facility] [place]

## 2019-01-06 ENCOUNTER — Other Ambulatory Visit (HOSPITAL_COMMUNITY)
Admission: RE | Admit: 2019-01-06 | Discharge: 2019-01-06 | Disposition: A | Discharge status: Home / Self Care | Payer: BC Managed Care – PPO | Source: Ambulatory Visit | Attending: Orthopedic Surgery | Admitting: Orthopedic Surgery

## 2019-01-06 DIAGNOSIS — Z1159 Encounter for screening for other viral diseases: Secondary | ICD-10-CM | POA: Diagnosis present

## 2019-01-06 LAB — SARS CORONAVIRUS 2 (TAT 6-24 HRS): SARS Coronavirus 2: NEGATIVE

## 2019-01-06 NOTE — Progress Notes (Addendum)
Epic  12-20-18 (HGA1C)   10-13-18 EKG on chart

## 2019-01-06 NOTE — Patient Instructions (Addendum)
YOU HAD THE COVID 19 TEST ON 01-06-19,  PLEASE BEGIN THE QUARANTINE INSTRUCTIONS AS OUTLINED IN YOUR HANDOUT.                Barbara Jackson  01/06/2019   Your procedure is scheduled on: 01-10-19    Report to [facility] Main  Entrance    Report to Admitting at 6:55 AM   1 VISITOR IS ALLOWED TO WAIT IN WAITING ROOM  ONLY DAY OF YOUR SURGERY.    Call this number if you have problems the morning of surgery [phone]    Remember: After Midnight.     NOTHING BY MOUTH EXCEPT CLEAR LIQUIDS UNTIL 3 HOURS PRIOR TO SCHEDULED SURGERY. PLEASE FINISH G2 DRINK PER SURGEON ORDER 3 HOURS PRIOR TO SCHEDULED SURGERY TIME WHICH NEEDS TO BE COMPLETED AT 6:25 AM.  CLEAR LIQUID DIET   Foods Allowed                                                                     Foods Excluded  Coffee and tea, regular and decaf                             liquids that you cannot  Plain Jell-O any favor except red or purple                                           see through such as: Fruit ices (not with fruit pulp)                                     milk, soups, orange juice  Iced Popsicles                                    All solid food Carbonated beverages, regular and diet                                    Cranberry, grape and apple juices Sports drinks like Gatorade Lightly seasoned clear broth or consume(fat free) Sugar, honey syrup  Sample Menu Breakfast                                Lunch                                     Supper Cranberry juice                    Beef broth                            Chicken broth Jell-O  Grape juice                           Apple juice Coffee or tea                        Jell-O                                      Popsicle                                                Coffee or tea                        Coffee or tea  _____________________________________________________________________     Take these  medicines the morning of surgery with A SIP OF WATER: Omeprazole  BRUSH YOUR TEETH MORNING OF SURGERY AND RINSE YOUR MOUTH OUT, NO CHEWING GUM CANDY OR MINTS.   DO NOT TAKE ANY DIABETIC MEDICATIONS DAY OF YOUR SURGERY                               You may not have any metal on your body including hair pins and              piercings     Do not wear jewelry, make-up, lotions, powders or perfumes, deodorant              Do not wear nail polish.  Do not shave  48 hours prior to surgery.                Do not bring valuables to the hospital. [place].  Contacts, dentures or bridgework may not be worn into surgery.   Bring small suitcase               Please read over the following fact sheets you were given: _____________________________________________________________________  How to Manage Your Diabetes Before and After Surgery  Why is it important to control my blood sugar before and after surgery? . Improving blood sugar levels before and after surgery helps healing and can limit problems. . A way of improving blood sugar control is eating a healthy diet by: o  Eating less sugar and carbohydrates o  Increasing activity/exercise o  Talking with your doctor about reaching your blood sugar goals . High blood sugars (greater than 180 mg/dL) can raise your risk of infections and slow your recovery, so you will need to focus on controlling your diabetes during the weeks before surgery. . Make sure that the doctor who takes care of your diabetes knows about your planned surgery including the date and location.  How do I manage my blood sugar before surgery? . Check your blood sugar at least 4 times a day, starting 2 days before surgery, to make sure that the level is not too high or low. o Check your blood sugar the morning of your surgery when you wake up and every 2 hours until you get to the Short Stay unit. Marland Kitchen  If your blood sugar is  less than 70 mg/dL, you will need to treat for low blood sugar: o Do not take insulin. o Treat a low blood sugar (less than 70 mg/dL) with  cup of clear juice (cranberry or apple), 4 glucose tablets, OR glucose gel. o Recheck blood sugar in 15 minutes after treatment (to make sure it is greater than 70 mg/dL). If your blood sugar is not greater than 70 mg/dL on recheck, call [phone] for further instructions. . Report your blood sugar to the short stay nurse when you get to Short Stay.  . If you are admitted to the hospital after surgery: o Your blood sugar will be checked by the staff and you will probably be given insulin after surgery (instead of oral diabetes medicines) to make sure you have good blood sugar levels. o The goal for blood sugar control after surgery is 80-180 mg/dL.   WHAT DO I DO ABOUT MY DIABETES MEDICATION?  Marland Kitchen Do not take oral diabetes medicines (pills) the morning of surgery.  . THE DAY BEFORE SURGERY, take only your morning dose of Glipizide. You may take your usual dose of Metformin.                [facility] - Preparing for Surgery Before surgery, you can play an important role.  Because skin is not sterile, your skin needs to be as free of germs as possible.  You can reduce the number of germs on your skin by washing with CHG (chlorahexidine gluconate) soap before surgery.  CHG is an antiseptic cleaner which kills germs and bonds with the skin to continue killing germs even after washing. Please DO NOT use if you have an allergy to CHG or antibacterial soaps.  If your skin becomes reddened/irritated stop using the CHG and inform your nurse when you arrive at Short Stay. Do not shave (including legs and underarms) for at least 48 hours prior to the first CHG shower.  You may shave your face/neck. Please follow these instructions carefully:  1.  Shower with CHG Soap the night before surgery and the  morning of Surgery.  2.  If you choose to wash your hair, wash  your hair first as usual with your  normal  shampoo.  3.  After you shampoo, rinse your hair and body thoroughly to remove the  shampoo.                           4.  Use CHG as you would any other liquid soap.  You can apply chg directly  to the skin and wash                       Gently with a scrungie or clean washcloth.  5.  Apply the CHG Soap to your body ONLY FROM THE NECK DOWN.   Do not use on face/ open                           Wound or open sores. Avoid contact with eyes, ears mouth and genitals (private parts).                       Wash face,  Genitals (private parts) with your normal soap.             6.  Wash thoroughly, paying special attention to  the area where your surgery  will be performed.  7.  Thoroughly rinse your body with warm water from the neck down.  8.  DO NOT shower/wash with your normal soap after using and rinsing off  the CHG Soap.                9.  Pat yourself dry with a clean towel.            10.  Wear clean pajamas.            11.  Place clean sheets on your bed the night of your first shower and do not  sleep with pets. Day of Surgery : Do not apply any lotions/deodorants the morning of surgery.  Please wear clean clothes to the hospital/surgery center.  FAILURE TO FOLLOW THESE INSTRUCTIONS MAY RESULT IN THE CANCELLATION OF YOUR SURGERY PATIENT SIGNATURE_________________________________  NURSE SIGNATURE__________________________________  ________________________________________________________________________   Adam Phenix  An incentive spirometer is a tool that can help keep your lungs clear and active. This tool measures how well you are filling your lungs with each breath. Taking long deep breaths may help reverse or decrease the chance of developing breathing (pulmonary) problems (especially infection) following:  A long period of time when you are unable to move or be active. BEFORE THE PROCEDURE   If the spirometer includes an  indicator to show your best effort, your nurse or respiratory therapist will set it to a desired goal.  If possible, sit up straight or lean slightly forward. Try not to slouch.  Hold the incentive spirometer in an upright position. INSTRUCTIONS FOR USE  1. Sit on the edge of your bed if possible, or sit up as far as you can in bed or on a chair. 2. Hold the incentive spirometer in an upright position. 3. Breathe out normally. 4. Place the mouthpiece in your mouth and seal your lips tightly around it. 5. Breathe in slowly and as deeply as possible, raising the piston or the ball toward the top of the column. 6. Hold your breath for 3-5 seconds or for as long as possible. Allow the piston or ball to fall to the bottom of the column. 7. Remove the mouthpiece from your mouth and breathe out normally. 8. Rest for a few seconds and repeat Steps 1 through 7 at least 10 times every 1-2 hours when you are awake. Take your time and take a few normal breaths between deep breaths. 9. The spirometer may include an indicator to show your best effort. Use the indicator as a goal to work toward during each repetition. 10. After each set of 10 deep breaths, practice coughing to be sure your lungs are clear. If you have an incision (the cut made at the time of surgery), support your incision when coughing by placing a pillow or rolled up towels firmly against it. Once you are able to get out of bed, walk around indoors and cough well. You may stop using the incentive spirometer when instructed by your caregiver.  RISKS AND COMPLICATIONS  Take your time so you do not get dizzy or light-headed.  If you are in pain, you may need to take or ask for pain medication before doing incentive spirometry. It is harder to take a deep breath if you are having pain. AFTER USE  Rest and breathe slowly and easily.  It can be helpful to keep track of a log of your progress. Your caregiver can provide you with a  simple table  to help with this. If you are using the spirometer at home, follow these instructions: [place] IF:   You are having difficultly using the spirometer.  You have trouble using the spirometer as often as instructed.  Your pain medication is not giving enough relief while using the spirometer.  You develop fever of 100.5 F (38.1 C) or higher. SEEK IMMEDIATE MEDICAL CARE IF:   You cough up bloody sputum that had not been present before.  You develop fever of 102 F (38.9 C) or greater.  You develop worsening pain at or near the incision site. MAKE SURE YOU:   Understand these instructions.  Will watch your condition.  Will get help right away if you are not doing well or get worse. Document Released: 10/13/2006 Document Revised: 08/25/2011 Document Reviewed: 12/14/2006 Exit[employer] Patient Information 2014 Exit[employer], [place].   ________________________________________________________________________  WHAT IS A BLOOD TRANSFUSION? Blood Transfusion Information  A transfusion is the replacement of blood or some of its parts. Blood is made up of multiple cells which provide different functions.  Red blood cells carry oxygen and are used for blood loss replacement.  White blood cells fight against infection.  Platelets control bleeding.  Plasma helps clot blood.  Other blood products are available for specialized needs, such as hemophilia or other clotting disorders. BEFORE THE TRANSFUSION  Who gives blood for transfusions?   Healthy volunteers who are fully evaluated to make sure their blood is safe. This is blood bank blood. Transfusion therapy is the safest it has ever been in the practice of medicine. Before blood is taken from a donor, a complete history is taken to make sure that person has no history of diseases nor engages in risky social behavior (examples are intravenous drug use or sexual activity with multiple partners). The donor's travel history is screened  to minimize risk of transmitting infections, such as malaria. The donated blood is tested for signs of infectious diseases, such as HIV and hepatitis. The blood is then tested to be sure it is compatible with you in order to minimize the chance of a transfusion reaction. If you or a relative donates blood, this is often done in anticipation of surgery and is not appropriate for emergency situations. It takes many days to process the donated blood. RISKS AND COMPLICATIONS Although transfusion therapy is very safe and saves many lives, the main dangers of transfusion include:   Getting an infectious disease.  Developing a transfusion reaction. This is an allergic reaction to something in the blood you were given. Every precaution is taken to prevent this. The decision to have a blood transfusion has been considered carefully by your caregiver before blood is given. Blood is not given unless the benefits outweigh the risks. AFTER THE TRANSFUSION  Right after receiving a blood transfusion, you will usually feel much better and more energetic. This is especially true if your red blood cells have gotten low (anemic). The transfusion raises the level of the red blood cells which carry oxygen, and this usually causes an energy increase.  The nurse administering the transfusion will monitor you carefully for complications. HOME CARE INSTRUCTIONS  No special instructions are needed after a transfusion. You may find your energy is better. Speak with your caregiver about any limitations on activity for underlying diseases you may have. SEEK MEDICAL CARE IF:   Your condition is not improving after your transfusion.  You develop redness or irritation at the intravenous (IV) site. SEEK IMMEDIATE  MEDICAL CARE IF:  Any of the following symptoms occur over the next 12 hours:  Shaking chills.  You have a temperature by mouth above 102 F (38.9 C), not controlled by medicine.  Chest, back, or muscle  pain.  People around you feel you are not acting correctly or are confused.  Shortness of breath or difficulty breathing.  Dizziness and fainting.  You get a rash or develop hives.  You have a decrease in urine output.  Your urine turns a dark color or changes to pink, red, or brown. Any of the following symptoms occur over the next 10 days:  You have a temperature by mouth above 102 F (38.9 C), not controlled by medicine.  Shortness of breath.  Weakness after normal activity.  The white part of the eye turns yellow (jaundice).  You have a decrease in the amount of urine or are urinating less often.  Your urine turns a dark color or changes to pink, red, or brown. Document Released: 05/30/2000 Document Revised: 08/25/2011 Document Reviewed: 01/17/2008 [facility] Patient Information 2014 [place], [place].  _______________________________________________________________________

## 2019-01-07 ENCOUNTER — Encounter (HOSPITAL_COMMUNITY)
Admission: RE | Admit: 2019-01-07 | Discharge: 2019-01-07 | Disposition: A | Discharge status: Home / Self Care | Payer: BC Managed Care – PPO | Source: Ambulatory Visit | Attending: Orthopedic Surgery | Admitting: Orthopedic Surgery

## 2019-01-07 ENCOUNTER — Encounter (HOSPITAL_COMMUNITY): Payer: Self-pay

## 2019-01-07 ENCOUNTER — Other Ambulatory Visit: Payer: Self-pay

## 2019-01-07 DIAGNOSIS — Z01812 Encounter for preprocedural laboratory examination: Secondary | ICD-10-CM | POA: Diagnosis not present

## 2019-01-07 DIAGNOSIS — M1711 Unilateral primary osteoarthritis, right knee: Secondary | ICD-10-CM | POA: Diagnosis not present

## 2019-01-07 LAB — COMPREHENSIVE METABOLIC PANEL
ALT: 22 U/L (ref 0–44)
AST: 25 U/L (ref 15–41)
Albumin: 4 g/dL (ref 3.5–5.0)
Alkaline Phosphatase: 46 U/L (ref 38–126)
Anion gap: 11 (ref 5–15)
BUN: 16 mg/dL (ref 8–23)
CO2: 25 mmol/L (ref 22–32)
Calcium: 9.2 mg/dL (ref 8.9–10.3)
Chloride: 103 mmol/L (ref 98–111)
Creatinine, Ser: 0.84 mg/dL (ref 0.44–1.00)
GFR calc Af Amer: >60 mL/min (ref >60)
GFR calc non Af Amer: >60 mL/min (ref >60)
Glucose, Bld: 129 mg/dL — ABNORMAL HIGH (ref 70–99)
Potassium: 3.5 mmol/L (ref 3.5–5.1)
Sodium: 139 mmol/L (ref 135–145)
Total Bilirubin: 0.8 mg/dL (ref 0.3–1.2)
Total Protein: 7.3 g/dL (ref 6.5–8.1)

## 2019-01-07 LAB — CBC
HCT: 44.7 % (ref 36.0–46.0)
Hemoglobin: 14.1 g/dL (ref 12.0–15.0)
MCH: 27.2 pg (ref 26.0–34.0)
MCHC: 31.5 g/dL (ref 30.0–36.0)
MCV: 86.3 fL (ref 80.0–100.0)
Platelets: 473 10*3/uL — ABNORMAL HIGH (ref 150–400)
RBC: 5.18 MIL/uL — ABNORMAL HIGH (ref 3.87–5.11)
RDW: 13.8 % (ref 11.5–15.5)
WBC: 9.6 10*3/uL (ref 4.0–10.5)
nRBC: 0 % (ref 0.0–0.2)

## 2019-01-07 LAB — APTT: aPTT: 28 seconds (ref 24–36)

## 2019-01-07 LAB — PROTIME-INR
INR: 1 (ref 0.8–1.2)
Prothrombin Time: 12.9 seconds (ref 11.4–15.2)

## 2019-01-07 LAB — GLUCOSE, CAPILLARY: Glucose-Capillary: 133 mg/dL — ABNORMAL HIGH (ref 70–99)

## 2019-01-07 LAB — SURGICAL PCR SCREEN
MRSA, PCR: NEGATIVE
Staphylococcus aureus: NEGATIVE

## 2019-01-09 MED ORDER — BUPIVACAINE LIPOSOME 1.3 % IJ SUSP
20.0000 mL | INTRAMUSCULAR | Status: DC
  Filled 2019-01-09: qty 20

## 2019-01-10 ENCOUNTER — Other Ambulatory Visit: Payer: Self-pay

## 2019-01-10 ENCOUNTER — Ambulatory Visit (HOSPITAL_COMMUNITY)
Admission: RE | Admit: 2019-01-10 | Discharge: 2019-01-12 | Disposition: A | Discharge status: Home / Self Care | Payer: BC Managed Care – PPO | Attending: Orthopedic Surgery | Admitting: Orthopedic Surgery

## 2019-01-10 ENCOUNTER — Ambulatory Visit (HOSPITAL_COMMUNITY): Payer: BC Managed Care – PPO | Admitting: Anesthesiology

## 2019-01-10 ENCOUNTER — Ambulatory Visit (HOSPITAL_COMMUNITY): Payer: BC Managed Care – PPO | Admitting: Physician Assistant

## 2019-01-10 ENCOUNTER — Encounter (HOSPITAL_COMMUNITY): Payer: Self-pay | Admitting: *Deleted

## 2019-01-10 ENCOUNTER — Encounter (HOSPITAL_COMMUNITY)
Admission: RE | Disposition: A | Discharge status: Home / Self Care | Payer: Self-pay | Source: Home / Self Care | Attending: Orthopedic Surgery

## 2019-01-10 DIAGNOSIS — F419 Anxiety disorder, unspecified: Secondary | ICD-10-CM | POA: Diagnosis not present

## 2019-01-10 DIAGNOSIS — Z7984 Long term (current) use of oral hypoglycemic drugs: Secondary | ICD-10-CM | POA: Diagnosis not present

## 2019-01-10 DIAGNOSIS — Z96652 Presence of left artificial knee joint: Secondary | ICD-10-CM | POA: Insufficient documentation

## 2019-01-10 DIAGNOSIS — M1711 Unilateral primary osteoarthritis, right knee: Secondary | ICD-10-CM | POA: Diagnosis not present

## 2019-01-10 DIAGNOSIS — E7800 Pure hypercholesterolemia, unspecified: Secondary | ICD-10-CM | POA: Diagnosis not present

## 2019-01-10 DIAGNOSIS — Z6841 Body Mass Index (BMI) 40.0 and over, adult: Secondary | ICD-10-CM | POA: Insufficient documentation

## 2019-01-10 DIAGNOSIS — E785 Hyperlipidemia, unspecified: Secondary | ICD-10-CM | POA: Diagnosis not present

## 2019-01-10 DIAGNOSIS — K449 Diaphragmatic hernia without obstruction or gangrene: Secondary | ICD-10-CM | POA: Insufficient documentation

## 2019-01-10 DIAGNOSIS — Z79899 Other long term (current) drug therapy: Secondary | ICD-10-CM | POA: Insufficient documentation

## 2019-01-10 DIAGNOSIS — G4733 Obstructive sleep apnea (adult) (pediatric): Secondary | ICD-10-CM | POA: Insufficient documentation

## 2019-01-10 DIAGNOSIS — E119 Type 2 diabetes mellitus without complications: Secondary | ICD-10-CM | POA: Insufficient documentation

## 2019-01-10 DIAGNOSIS — K219 Gastro-esophageal reflux disease without esophagitis: Secondary | ICD-10-CM | POA: Insufficient documentation

## 2019-01-10 DIAGNOSIS — I10 Essential (primary) hypertension: Secondary | ICD-10-CM | POA: Insufficient documentation

## 2019-01-10 HISTORY — PX: TOTAL KNEE ARTHROPLASTY: SHX125

## 2019-01-10 LAB — GLUCOSE, CAPILLARY
Glucose-Capillary: 163 mg/dL — ABNORMAL HIGH (ref 70–99)
Glucose-Capillary: 116 mg/dL — ABNORMAL HIGH (ref 70–99)
Glucose-Capillary: 201 mg/dL — ABNORMAL HIGH (ref 70–99)
Glucose-Capillary: 183 mg/dL — ABNORMAL HIGH (ref 70–99)

## 2019-01-10 LAB — TYPE AND SCREEN
ABO/RH(D): A POS
Antibody Screen: NEGATIVE

## 2019-01-10 SURGERY — ARTHROPLASTY, KNEE, TOTAL
Anesthesia: Spinal | Laterality: Right

## 2019-01-10 MED ORDER — GLYCOPYRROLATE PF 0.2 MG/ML IJ SOSY
PREFILLED_SYRINGE | INTRAMUSCULAR | Status: AC
  Filled 2019-01-10: qty 1

## 2019-01-10 MED ORDER — LACTATED RINGERS IV SOLN
INTRAVENOUS | Status: DC
  Administered 2019-01-10 (×2): via INTRAVENOUS

## 2019-01-10 MED ORDER — PANTOPRAZOLE SODIUM 40 MG PO TBEC
80.0000 mg | DELAYED_RELEASE_TABLET | Freq: Every day | ORAL | Status: DC
  Administered 2019-01-11 – 2019-01-12 (×2): 80 mg via ORAL
  Filled 2019-01-10 (×2): qty 2

## 2019-01-10 MED ORDER — ZOLPIDEM TARTRATE 5 MG PO TABS
5.0000 mg | ORAL_TABLET | Freq: Every evening | ORAL | Status: DC | PRN
  Administered 2019-01-10: 5 mg via ORAL
  Filled 2019-01-10 (×2): qty 1

## 2019-01-10 MED ORDER — BISACODYL 10 MG RE SUPP: 10.0000 mg | Freq: Every day | RECTAL | Status: DC | PRN

## 2019-01-10 MED ORDER — POLYVINYL ALCOHOL 1.4 % OP SOLN
1.0000 [drp] | Freq: Two times a day (BID) | OPHTHALMIC | Status: DC | PRN
  Filled 2019-01-10: qty 15

## 2019-01-10 MED ORDER — FLEET ENEMA 7-19 GM/118ML RE ENEM: 1.0000 | ENEMA | Freq: Once | RECTAL | Status: DC | PRN

## 2019-01-10 MED ORDER — MIDAZOLAM HCL 5 MG/5ML IJ SOLN
INTRAMUSCULAR | Status: DC | PRN
  Administered 2019-01-10: 2 mg via INTRAVENOUS

## 2019-01-10 MED ORDER — DIPHENHYDRAMINE HCL 12.5 MG/5ML PO ELIX
12.5000 mg | ORAL_SOLUTION | ORAL | Status: DC | PRN
  Administered 2019-01-11 (×2): 25 mg via ORAL
  Filled 2019-01-10 (×2): qty 10

## 2019-01-10 MED ORDER — CHLORHEXIDINE GLUCONATE 4 % EX LIQD: 60.0000 mL | Freq: Once | CUTANEOUS | Status: DC

## 2019-01-10 MED ORDER — GLIPIZIDE ER 5 MG PO TB24
5.0000 mg | ORAL_TABLET | Freq: Every day | ORAL | Status: DC
  Administered 2019-01-11 – 2019-01-12 (×2): 5 mg via ORAL
  Filled 2019-01-10 (×2): qty 1

## 2019-01-10 MED ORDER — MENTHOL 3 MG MT LOZG: 1.0000 | LOZENGE | OROMUCOSAL | Status: DC | PRN

## 2019-01-10 MED ORDER — INSULIN ASPART 100 UNIT/ML ~~LOC~~ SOLN
0.0000 [IU] | Freq: Three times a day (TID) | SUBCUTANEOUS | Status: DC
  Administered 2019-01-10: 5 [IU] via SUBCUTANEOUS
  Administered 2019-01-11: 2 [IU] via SUBCUTANEOUS
  Administered 2019-01-11: 3 [IU] via SUBCUTANEOUS
  Administered 2019-01-12: 2 [IU] via SUBCUTANEOUS

## 2019-01-10 MED ORDER — SODIUM CHLORIDE 0.9 % IV SOLN
INTRAVENOUS | Status: DC
  Administered 2019-01-10 – 2019-01-11 (×2): via INTRAVENOUS

## 2019-01-10 MED ORDER — OXYBUTYNIN CHLORIDE 5 MG PO TABS: 5.0000 mg | ORAL_TABLET | Freq: Three times a day (TID) | ORAL | Status: DC | PRN

## 2019-01-10 MED ORDER — STERILE WATER FOR IRRIGATION IR SOLN
Status: DC | PRN
  Administered 2019-01-10: 2000 mL

## 2019-01-10 MED ORDER — METHOCARBAMOL 500 MG IVPB - SIMPLE MED
INTRAVENOUS | Status: AC
  Filled 2019-01-10: qty 50

## 2019-01-10 MED ORDER — ONDANSETRON HCL 4 MG/2ML IJ SOLN: 4.0000 mg | Freq: Four times a day (QID) | INTRAMUSCULAR | Status: DC | PRN

## 2019-01-10 MED ORDER — GABAPENTIN 300 MG PO CAPS
300.0000 mg | ORAL_CAPSULE | Freq: Three times a day (TID) | ORAL | Status: DC
  Administered 2019-01-10 – 2019-01-12 (×6): 300 mg via ORAL
  Filled 2019-01-10 (×7): qty 1

## 2019-01-10 MED ORDER — METHOCARBAMOL 500 MG IVPB - SIMPLE MED
500.0000 mg | Freq: Four times a day (QID) | INTRAVENOUS | Status: DC | PRN
  Administered 2019-01-10: 500 mg via INTRAVENOUS
  Filled 2019-01-10: qty 50

## 2019-01-10 MED ORDER — MIDAZOLAM HCL 2 MG/2ML IJ SOLN
1.0000 mg | INTRAMUSCULAR | Status: DC
  Administered 2019-01-10: 2 mg via INTRAVENOUS
  Filled 2019-01-10: qty 2

## 2019-01-10 MED ORDER — LOSARTAN POTASSIUM 50 MG PO TABS
100.0000 mg | ORAL_TABLET | Freq: Every day | ORAL | Status: DC
  Administered 2019-01-11 – 2019-01-12 (×2): 100 mg via ORAL
  Filled 2019-01-10 (×2): qty 2

## 2019-01-10 MED ORDER — LOSARTAN POTASSIUM-HCTZ 100-25 MG PO TABS: 1.0000 | ORAL_TABLET | Freq: Every morning | ORAL | Status: DC

## 2019-01-10 MED ORDER — FENTANYL CITRATE (PF) 100 MCG/2ML IJ SOLN
25.0000 ug | INTRAMUSCULAR | Status: DC | PRN
  Administered 2019-01-10: 50 ug via INTRAVENOUS

## 2019-01-10 MED ORDER — SODIUM CHLORIDE (PF) 0.9 % IJ SOLN
INTRAMUSCULAR | Status: AC
  Filled 2019-01-10: qty 10

## 2019-01-10 MED ORDER — PROPOFOL 500 MG/50ML IV EMUL
INTRAVENOUS | Status: DC | PRN
  Administered 2019-01-10: 95 ug/kg/min via INTRAVENOUS

## 2019-01-10 MED ORDER — METOCLOPRAMIDE HCL 5 MG PO TABS: 5.0000 mg | ORAL_TABLET | Freq: Three times a day (TID) | ORAL | Status: DC | PRN

## 2019-01-10 MED ORDER — PROPOFOL 10 MG/ML IV BOLUS
INTRAVENOUS | Status: AC
  Filled 2019-01-10: qty 60

## 2019-01-10 MED ORDER — POVIDONE-IODINE 10 % EX SWAB
2.0000 "application " | Freq: Once | CUTANEOUS | Status: AC
  Administered 2019-01-10: 2 via TOPICAL

## 2019-01-10 MED ORDER — CEFAZOLIN SODIUM-DEXTROSE 2-4 GM/100ML-% IV SOLN
2.0000 g | INTRAVENOUS | Status: AC
  Administered 2019-01-10: 10:00:00 2 g via INTRAVENOUS
  Filled 2019-01-10: qty 100

## 2019-01-10 MED ORDER — CEFAZOLIN SODIUM-DEXTROSE 2-4 GM/100ML-% IV SOLN
2.0000 g | Freq: Four times a day (QID) | INTRAVENOUS | Status: AC
  Administered 2019-01-10 (×2): 2 g via INTRAVENOUS
  Filled 2019-01-10 (×2): qty 100

## 2019-01-10 MED ORDER — ACETAMINOPHEN 500 MG PO TABS
1000.0000 mg | ORAL_TABLET | Freq: Four times a day (QID) | ORAL | Status: AC
  Administered 2019-01-10 – 2019-01-11 (×4): 1000 mg via ORAL
  Filled 2019-01-10 (×4): qty 2

## 2019-01-10 MED ORDER — GLYCOPYRROLATE PF 0.2 MG/ML IJ SOSY
PREFILLED_SYRINGE | INTRAMUSCULAR | Status: DC | PRN
  Administered 2019-01-10: .2 mg via INTRAVENOUS

## 2019-01-10 MED ORDER — PROPYLENE GLYCOL 0.6 % OP SOLN: 1.0000 [drp] | Freq: Two times a day (BID) | OPHTHALMIC | Status: DC | PRN

## 2019-01-10 MED ORDER — POTASSIUM CHLORIDE CRYS ER 20 MEQ PO TBCR
20.0000 meq | EXTENDED_RELEASE_TABLET | Freq: Every day | ORAL | Status: DC
  Administered 2019-01-10 – 2019-01-12 (×3): 20 meq via ORAL
  Filled 2019-01-10 (×3): qty 1

## 2019-01-10 MED ORDER — SODIUM CHLORIDE 0.9 % IR SOLN
Status: DC | PRN
  Administered 2019-01-10: 1000 mL

## 2019-01-10 MED ORDER — BUPIVACAINE-EPINEPHRINE (PF) 0.5% -1:200000 IJ SOLN
INTRAMUSCULAR | Status: DC | PRN
  Administered 2019-01-10: 30 mL via PERINEURAL

## 2019-01-10 MED ORDER — DIAZEPAM 5 MG PO TABS: 5.0000 mg | ORAL_TABLET | Freq: Three times a day (TID) | ORAL | Status: DC | PRN

## 2019-01-10 MED ORDER — SERTRALINE HCL 50 MG PO TABS
50.0000 mg | ORAL_TABLET | Freq: Every day | ORAL | Status: DC
  Administered 2019-01-10 – 2019-01-11 (×2): 50 mg via ORAL
  Filled 2019-01-10 (×2): qty 1

## 2019-01-10 MED ORDER — BUPIVACAINE IN DEXTROSE 0.75-8.25 % IT SOLN
INTRATHECAL | Status: DC | PRN
  Administered 2019-01-10: 1.6 mL via INTRATHECAL

## 2019-01-10 MED ORDER — POLYETHYLENE GLYCOL 3350 17 G PO PACK: 17.0000 g | PACK | Freq: Every day | ORAL | Status: DC | PRN

## 2019-01-10 MED ORDER — OXYCODONE HCL 5 MG PO TABS
10.0000 mg | ORAL_TABLET | ORAL | Status: DC | PRN
  Administered 2019-01-10 – 2019-01-11 (×5): 10 mg via ORAL
  Filled 2019-01-10 (×5): qty 2

## 2019-01-10 MED ORDER — 0.9 % SODIUM CHLORIDE (POUR BTL) OPTIME
TOPICAL | Status: DC | PRN
  Administered 2019-01-10: 10:00:00 1000 mL

## 2019-01-10 MED ORDER — MORPHINE SULFATE (PF) 2 MG/ML IV SOLN
1.0000 mg | INTRAVENOUS | Status: DC | PRN
  Administered 2019-01-10: 1 mg via INTRAVENOUS
  Administered 2019-01-11: 2 mg via INTRAVENOUS
  Filled 2019-01-10 (×2): qty 1

## 2019-01-10 MED ORDER — SODIUM CHLORIDE (PF) 0.9 % IJ SOLN
INTRAMUSCULAR | Status: DC | PRN
  Administered 2019-01-10: 60 mL

## 2019-01-10 MED ORDER — TRANEXAMIC ACID-NACL 1000-0.7 MG/100ML-% IV SOLN
1000.0000 mg | INTRAVENOUS | Status: AC
  Administered 2019-01-10: 10:00:00 1000 mg via INTRAVENOUS
  Filled 2019-01-10: qty 100

## 2019-01-10 MED ORDER — HYDROCHLOROTHIAZIDE 25 MG PO TABS
25.0000 mg | ORAL_TABLET | Freq: Every day | ORAL | Status: DC
  Administered 2019-01-11 – 2019-01-12 (×2): 25 mg via ORAL
  Filled 2019-01-10 (×2): qty 1

## 2019-01-10 MED ORDER — PHENOL 1.4 % MT LIQD
1.0000 | OROMUCOSAL | Status: DC | PRN
  Filled 2019-01-10: qty 177

## 2019-01-10 MED ORDER — DOCUSATE SODIUM 100 MG PO CAPS
100.0000 mg | ORAL_CAPSULE | Freq: Two times a day (BID) | ORAL | Status: DC
  Administered 2019-01-10 – 2019-01-12 (×4): 100 mg via ORAL
  Filled 2019-01-10 (×4): qty 1

## 2019-01-10 MED ORDER — ACETAMINOPHEN 10 MG/ML IV SOLN
1000.0000 mg | Freq: Four times a day (QID) | INTRAVENOUS | Status: DC
  Administered 2019-01-10: 1000 mg via INTRAVENOUS
  Filled 2019-01-10: qty 100

## 2019-01-10 MED ORDER — METOCLOPRAMIDE HCL 5 MG/ML IJ SOLN: 5.0000 mg | Freq: Three times a day (TID) | INTRAMUSCULAR | Status: DC | PRN

## 2019-01-10 MED ORDER — MIDAZOLAM HCL 2 MG/2ML IJ SOLN
INTRAMUSCULAR | Status: AC
  Filled 2019-01-10: qty 2

## 2019-01-10 MED ORDER — ONDANSETRON HCL 4 MG/2ML IJ SOLN
INTRAMUSCULAR | Status: DC | PRN
  Administered 2019-01-10: 4 mg via INTRAVENOUS

## 2019-01-10 MED ORDER — DEXAMETHASONE SODIUM PHOSPHATE 10 MG/ML IJ SOLN
10.0000 mg | Freq: Once | INTRAMUSCULAR | Status: AC
  Administered 2019-01-11: 10 mg via INTRAVENOUS
  Filled 2019-01-10: qty 1

## 2019-01-10 MED ORDER — FENTANYL CITRATE (PF) 100 MCG/2ML IJ SOLN
50.0000 ug | INTRAMUSCULAR | Status: DC
  Administered 2019-01-10: 50 ug via INTRAVENOUS
  Filled 2019-01-10: qty 2

## 2019-01-10 MED ORDER — FENTANYL CITRATE (PF) 100 MCG/2ML IJ SOLN
INTRAMUSCULAR | Status: AC
  Filled 2019-01-10: qty 2

## 2019-01-10 MED ORDER — ATORVASTATIN CALCIUM 20 MG PO TABS
20.0000 mg | ORAL_TABLET | Freq: Every day | ORAL | Status: DC
  Administered 2019-01-10 – 2019-01-11 (×2): 20 mg via ORAL
  Filled 2019-01-10 (×2): qty 1

## 2019-01-10 MED ORDER — SODIUM CHLORIDE (PF) 0.9 % IJ SOLN
INTRAMUSCULAR | Status: AC
  Filled 2019-01-10: qty 50

## 2019-01-10 MED ORDER — ONDANSETRON HCL 4 MG PO TABS: 4.0000 mg | ORAL_TABLET | Freq: Four times a day (QID) | ORAL | Status: DC | PRN

## 2019-01-10 MED ORDER — ALBUTEROL SULFATE (2.5 MG/3ML) 0.083% IN NEBU: 3.0000 mL | INHALATION_SOLUTION | Freq: Four times a day (QID) | RESPIRATORY_TRACT | Status: DC | PRN

## 2019-01-10 MED ORDER — PROPOFOL 10 MG/ML IV BOLUS
INTRAVENOUS | Status: DC | PRN
  Administered 2019-01-10: 25 mg via INTRAVENOUS

## 2019-01-10 MED ORDER — TRANEXAMIC ACID-NACL 1000-0.7 MG/100ML-% IV SOLN
1000.0000 mg | Freq: Once | INTRAVENOUS | Status: AC
  Administered 2019-01-10: 1000 mg via INTRAVENOUS
  Filled 2019-01-10: qty 100

## 2019-01-10 MED ORDER — METHOCARBAMOL 500 MG PO TABS
500.0000 mg | ORAL_TABLET | Freq: Four times a day (QID) | ORAL | Status: DC | PRN
  Administered 2019-01-10 – 2019-01-11 (×3): 500 mg via ORAL
  Filled 2019-01-10 (×3): qty 1

## 2019-01-10 MED ORDER — ONDANSETRON HCL 4 MG/2ML IJ SOLN: 4.0000 mg | Freq: Once | INTRAMUSCULAR | Status: DC | PRN

## 2019-01-10 MED ORDER — SODIUM CHLORIDE 0.9 % IV SOLN
INTRAVENOUS | Status: DC | PRN
  Administered 2019-01-10: 10:00:00 20 ug/min via INTRAVENOUS

## 2019-01-10 MED ORDER — DEXAMETHASONE SODIUM PHOSPHATE 10 MG/ML IJ SOLN
8.0000 mg | Freq: Once | INTRAMUSCULAR | Status: AC
  Administered 2019-01-10: 10:00:00 8 mg via INTRAVENOUS

## 2019-01-10 MED ORDER — OXYCODONE HCL 5 MG PO TABS: 5.0000 mg | ORAL_TABLET | ORAL | Status: DC | PRN

## 2019-01-10 MED ORDER — BUPIVACAINE LIPOSOME 1.3 % IJ SUSP
INTRAMUSCULAR | Status: DC | PRN
  Administered 2019-01-10: 20 mL

## 2019-01-10 MED ORDER — ASPIRIN EC 325 MG PO TBEC
325.0000 mg | DELAYED_RELEASE_TABLET | Freq: Two times a day (BID) | ORAL | Status: DC
  Administered 2019-01-11 – 2019-01-12 (×3): 325 mg via ORAL
  Filled 2019-01-10 (×3): qty 1

## 2019-01-10 SURGICAL SUPPLY — 63 items
ATTUNE PS FEM RT SZ 5 CEM KNEE (Femur) ×2 IMPLANT
BAG SPEC THK2 15X12 ZIP CLS (MISCELLANEOUS) ×1
BAG ZIPLOCK 12X15 (MISCELLANEOUS) ×3 IMPLANT
BASE TIBIAL ROT PLAT SZ 5 KNEE (Knees) IMPLANT
BLADE SAG 18X100X1.27 (BLADE) ×3 IMPLANT
BLADE SAW SGTL 11.0X1.19X90.0M (BLADE) ×3 IMPLANT
BLADE SURG SZ10 CARB STEEL (BLADE) ×6 IMPLANT
BNDG CMPR 82X61 PLY HI ABS (GAUZE/BANDAGES/DRESSINGS) ×1
BNDG CONFORM 6X.82 1P STRL (GAUZE/BANDAGES/DRESSINGS) ×2 IMPLANT
BNDG ELASTIC 6X5.8 VLCR STR LF (GAUZE/BANDAGES/DRESSINGS) ×5 IMPLANT
BOWL SMART MIX CTS (DISPOSABLE) ×3 IMPLANT
BSPLAT TIB 5 CMNT ROT PLAT STR (Knees) ×1 IMPLANT
CEMENT HV SMART SET (Cement) ×6 IMPLANT
CLOSURE WOUND 1/2 X4 (GAUZE/BANDAGES/DRESSINGS) ×2
COVER SURGICAL LIGHT HANDLE (MISCELLANEOUS) ×3 IMPLANT
COVER WAND RF STERILE (DRAPES) IMPLANT
CUFF TOURN SGL QUICK 34 (TOURNIQUET CUFF) ×3
CUFF TRNQT CYL 34X4.125X (TOURNIQUET CUFF) ×1 IMPLANT
DECANTER SPIKE VIAL GLASS SM (MISCELLANEOUS) ×3 IMPLANT
DRAPE U-SHAPE 47X51 STRL (DRAPES) ×3 IMPLANT
DRSG ADAPTIC 3X8 NADH LF (GAUZE/BANDAGES/DRESSINGS) ×3 IMPLANT
DRSG PAD ABDOMINAL 8X10 ST (GAUZE/BANDAGES/DRESSINGS) ×3 IMPLANT
DURAPREP 26ML APPLICATOR (WOUND CARE) ×3 IMPLANT
ELECT REM PT RETURN 15FT ADLT (MISCELLANEOUS) ×3 IMPLANT
EVACUATOR 1/8 PVC DRAIN (DRAIN) ×3 IMPLANT
GAUZE SPONGE 4X4 12PLY STRL (GAUZE/BANDAGES/DRESSINGS) ×3 IMPLANT
GLOVE BIO SURGEON STRL SZ7 (GLOVE) ×3 IMPLANT
GLOVE BIO SURGEON STRL SZ8 (GLOVE) ×3 IMPLANT
GLOVE BIOGEL PI IND STRL 6.5 (GLOVE) ×1 IMPLANT
GLOVE BIOGEL PI IND STRL 7.0 (GLOVE) ×1 IMPLANT
GLOVE BIOGEL PI IND STRL 8 (GLOVE) ×1 IMPLANT
GLOVE BIOGEL PI INDICATOR 6.5 (GLOVE) ×2
GLOVE BIOGEL PI INDICATOR 7.0 (GLOVE) ×2
GLOVE BIOGEL PI INDICATOR 8 (GLOVE) ×2
GLOVE SURG SS PI 6.5 STRL IVOR (GLOVE) ×3 IMPLANT
GOWN STRL REUS W/TWL LRG LVL3 (GOWN DISPOSABLE) ×9 IMPLANT
HANDPIECE INTERPULSE COAX TIP (DISPOSABLE) ×3
HOLDER FOLEY CATH W/STRAP (MISCELLANEOUS) IMPLANT
IMMOBILIZER KNEE 20 (SOFTGOODS) ×3
IMMOBILIZER KNEE 20 THIGH 36 (SOFTGOODS) ×1 IMPLANT
INSERT TIB ATTUNE RP SZ5X14 (Insert) ×2 IMPLANT
KIT TURNOVER KIT A (KITS) IMPLANT
MANIFOLD NEPTUNE II (INSTRUMENTS) ×3 IMPLANT
NS IRRIG 1000ML POUR BTL (IV SOLUTION) ×3 IMPLANT
PACK TOTAL KNEE CUSTOM (KITS) ×3 IMPLANT
PADDING CAST COTTON 6X4 STRL (CAST SUPPLIES) ×9 IMPLANT
PATELLA MEDIAL ATTUN 35MM KNEE (Knees) ×2 IMPLANT
PIN STEINMAN FIXATION KNEE (PIN) ×2 IMPLANT
PIN THREADED HEADED SIGMA (PIN) ×2 IMPLANT
PROTECTOR NERVE ULNAR (MISCELLANEOUS) ×3 IMPLANT
SET HNDPC FAN SPRY TIP SCT (DISPOSABLE) ×1 IMPLANT
STRIP CLOSURE SKIN 1/2X4 (GAUZE/BANDAGES/DRESSINGS) ×4 IMPLANT
SUT MNCRL AB 4-0 PS2 18 (SUTURE) ×3 IMPLANT
SUT STRATAFIX 0 PDS 27 VIOLET (SUTURE) ×3
SUT VIC AB 2-0 CT1 27 (SUTURE) ×9
SUT VIC AB 2-0 CT1 TAPERPNT 27 (SUTURE) ×3 IMPLANT
SUTURE STRATFX 0 PDS 27 VIOLET (SUTURE) ×1 IMPLANT
TAPE STRIPS DRAPE STRL (GAUZE/BANDAGES/DRESSINGS) ×2 IMPLANT
TIBIAL BASE ROT PLAT SZ 5 KNEE (Knees) ×3 IMPLANT
TRAY FOLEY MTR SLVR 16FR STAT (SET/KITS/TRAYS/PACK) ×3 IMPLANT
WATER STERILE IRR 1000ML POUR (IV SOLUTION) ×6 IMPLANT
WRAP KNEE MAXI GEL POST OP (GAUZE/BANDAGES/DRESSINGS) ×3 IMPLANT
YANKAUER SUCT BULB TIP 10FT TU (MISCELLANEOUS) ×3 IMPLANT

## 2019-01-10 NOTE — Evaluation (Signed)
Physical Therapy Evaluation Patient Details Name: Barbara Jackson MRN: [id number] DOB: [birth year] Today's Date: 01/10/2019   History of Present Illness  63 yo female s/p R TKR on 01/10/19. PMH includes obesity, HLD, L TKR, OSA, HTN, DMII, anxiety.  Clinical Impression  Pt presents with severe R knee pain, decreased R knee ROM, RLE post-operative weakness, difficulty performing mobility tasks, decreased tolerance for ambulation due to pain. Pt to benefit from acute PT to address deficits. Pt ambulated 3 ft in room, very limited by pain and required frequent verbal cuing for form and safety. Pt educated on ankle pumps (20/hour) to perform this afternoon/evening to increase circulation, to pt's tolerance and limited by pain. PT to progress mobility as tolerated, and will continue to follow acutely.        Follow Up Recommendations Follow surgeon's recommendation for DC plan and follow-up therapies;Supervision for mobility/OOB    Equipment Recommendations  None recommended by PT    Recommendations for Other Services       Precautions / Restrictions Precautions Precautions: Fall Required Braces or Orthoses: Knee Immobilizer - Right Knee Immobilizer - Right: On when out of bed or walking;Discontinue once straight leg raise with < 10 degree lag Restrictions Weight Bearing Restrictions: No Other Position/Activity Restrictions: WBAT      Mobility  Bed Mobility Overal bed mobility: Needs Assistance Bed Mobility: Supine to Sit     Supine to sit: Min assist;+2 for physical assistance;HOB elevated     General bed mobility comments: Min assist for LE management, trunk elevation, verbal cuing for sequencing to EOB. Increased time to scoot to EOB and come to sitting with use of bedrails.  Transfers Overall transfer level: Needs assistance Equipment used: Rolling walker (2 wheeled) Transfers: Sit to/from Stand Sit to Stand: Min assist;+2 physical assistance;From elevated surface         General transfer comment: min assist +2 for power up, steadying. PT encouraging pt to rely on LLE for standing, then slowly shift weight to RLE due to pt's severe pain. Pre-gait tasks of weight shifting performed x71minutes EOB.  Ambulation/Gait Ambulation/Gait assistance: Min assist;+2 physical assistance;+2 safety/equipment Gait Distance (Feet): 3 Feet Assistive device: Rolling walker (2 wheeled) Gait Pattern/deviations: Step-to pattern;Decreased step length - right;Decreased stance time - right;Decreased weight shift to right;Trunk flexed;Antalgic Gait velocity: decr   General Gait Details: Min assist for steadying, frequent VCs for sequencing with step-to gait and offweighting RLE with use of UEs through RW. Pt very limited by RLE pain, wincing and throwing her head back with weightbearing on RLE.  Stairs            Wheelchair Mobility    Modified Rankin (Stroke Patients Only)       Balance Overall balance assessment: Mild deficits observed, not formally tested                                           Pertinent Vitals/Pain Pain Assessment: 0-10 Pain Score: 8  Pain Location: R knee Pain Descriptors / Indicators: Sore Pain Intervention(s): Limited activity within patient's tolerance;Monitored during session;Premedicated before session;Repositioned;Ice applied    Home Living Family/patient expects to be discharged to:: Private residence Living Arrangements: Alone Available Help at Discharge: Family;Available 24 hours/day(son from [place] to stay with pt for 2 weeks post-operatively) Type of Home: House Home Access: Stairs to enter Entrance Stairs-Rails: None Entrance Stairs-Number of Steps: 1+1 Home Layout:  One level Home Equipment: [place] - 2 wheels;Cane - single point      Prior Function Level of Independence: Independent with assistive device(s)         Comments: pt reports using cane as needed for ambulation     Hand Dominance    Dominant Hand: Right    Extremity/Trunk Assessment   Upper Extremity Assessment Upper Extremity Assessment: Overall WFL for tasks assessed    Lower Extremity Assessment Lower Extremity Assessment: Generalized weakness;RLE deficits/detail RLE Deficits / Details: suspected post-surgical weakness; able to perform ankle pumps, quad set, heel slide to 40*, unable to complete SLR RLE Sensation: WNL    Cervical / Trunk Assessment Cervical / Trunk Assessment: Normal  Communication   Communication: No difficulties  Cognition Arousal/Alertness: Awake/alert Behavior During Therapy: Anxious Overall Cognitive Status: Within Functional Limits for tasks assessed                                        General Comments      Exercises     Assessment/Plan    PT Assessment Patient needs continued PT services  PT Problem List Decreased strength;Decreased mobility;Decreased range of motion;Decreased activity tolerance;Decreased balance;Decreased knowledge of use of DME;Pain       PT Treatment Interventions DME instruction;Therapeutic activities;Gait training;Therapeutic exercise;Patient/family education;Balance training;Stair training;Functional mobility training    PT Goals (Current goals can be found in the Care Plan section)  Acute Rehab PT Goals Patient Stated Goal: go home PT Goal Formulation: With patient Time For Goal Achievement: 01/17/19 Potential to Achieve Goals: Good    Frequency 7X/week   Barriers to discharge        Co-evaluation               AM-PAC PT "6 Clicks" Mobility  Outcome Measure Help needed turning from your back to your side while in a flat bed without using bedrails?: A Little Help needed moving from lying on your back to sitting on the side of a flat bed without using bedrails?: A Little Help needed moving to and from a bed to a chair (including a wheelchair)?: A Little Help needed standing up from a chair using your arms (e.g.,  wheelchair or bedside chair)?: A Little Help needed to walk in hospital room?: A Lot Help needed climbing 3-5 steps with a railing? : A Lot 6 Click Score: 16    End of Session Equipment Utilized During Treatment: Gait belt;Right knee immobilizer Activity Tolerance: Patient limited by pain;Patient limited by fatigue Patient left: in chair;with chair alarm set;with call bell/phone within reach;with SCD's reapplied Nurse Communication: Mobility status PT Visit Diagnosis: Other abnormalities of gait and mobility (R26.89);Difficulty in walking, not elsewhere classified (R26.2)    Time: 9767-3419 PT Time Calculation (min) (ACUTE ONLY): 20 min   Charges:   PT Evaluation $PT Eval Low Complexity: 1 Low         Julien Girt, PT Acute Rehabilitation Services Pager [phone]  Office [phone]   Roxine Caddy D Elonda Husky 01/10/2019, 6:26 PM

## 2019-01-10 NOTE — Anesthesia Procedure Notes (Signed)
Anesthesia Regional Block: Adductor canal block   Pre-Anesthetic Checklist: ,, timeout performed, Correct Patient, Correct Site, Correct Laterality, Correct Procedure, Correct Position, site marked, Risks and benefits discussed,  Surgical consent,  Pre-op evaluation,  At surgeon's request and post-op pain management  Laterality: Right  Prep: chloraprep       Needles:  Injection technique: Single-shot  Needle Type: Echogenic Stimulator Needle     Needle Length: 9cm  Needle Gauge: 21   Needle insertion depth: 9 cm   Additional Needles:   Procedures:,,,, ultrasound used (permanent image in chart),,,,  Narrative:  Start time: 01/10/2019 8:45 AM End time: 01/10/2019 8:50 AM Injection made incrementally with aspirations every 5 mL.  Performed by: Personally  Anesthesiologist: Josephine Igo, MD  Additional Notes: Timeout performed. Patient sedated. Relevant anatomy ID'd using [place]. Incremental 2-77ml injection of LA with frequent aspiration. Patient tolerated procedure well.        Right Adductor Canal Block

## 2019-01-10 NOTE — Discharge Instructions (Signed)
° °Dr. Frank Aluisio °Total Joint Specialist °[facility] °[address] °[place], [place] [address] °[phone] ° °TOTAL KNEE REPLACEMENT POSTOPERATIVE DIRECTIONS ° °Knee Rehabilitation, Guidelines Following Surgery  °Results after knee surgery are often greatly improved when you follow the exercise, range of motion and muscle strengthening exercises prescribed by your doctor. Safety measures are also important to protect the knee from further injury. Any time any of these exercises cause you to have increased pain or swelling in your knee joint, decrease the amount until you are comfortable again and slowly increase them. If you have problems or questions, call your caregiver or physical therapist for advice.  ° °HOME CARE INSTRUCTIONS  °• Remove items at home which could result in a fall. This includes throw rugs or furniture in walking pathways.  °· ICE to the affected knee every three hours for 30 minutes at a time and then as needed for pain and swelling.  Continue to use ice on the knee for pain and swelling from surgery. You may notice swelling that will progress down to the foot and ankle.  This is normal after surgery.  Elevate the leg when you are not up walking on it.   °· Continue to use the breathing machine which will help keep your temperature down.  It is common for your temperature to cycle up and down following surgery, especially at night when you are not up moving around and exerting yourself.  The breathing machine keeps your lungs expanded and your temperature down. °· Do not place pillow under knee, focus on keeping the knee straight while resting ° °DIET °You may resume your previous home diet once your are discharged from the hospital. ° °DRESSING / WOUND CARE / SHOWERING °You may shower 3 days after surgery, but keep the wounds dry during showering.  You may use an occlusive plastic wrap (Press'n Seal for example), NO SOAKING/SUBMERGING IN THE BATHTUB.  If the bandage  gets wet, change with a clean dry gauze.  If the incision gets wet, pat the wound dry with a clean towel. °You may start showering once you are discharged home but do not submerge the incision under water. Just pat the incision dry and apply a dry gauze dressing on daily. °Change the surgical dressing daily and reapply a dry dressing each time. ° °ACTIVITY °Walk with your walker as instructed. °Use walker as long as suggested by your caregivers. °Avoid periods of inactivity such as sitting longer than an hour when not asleep. This helps prevent blood clots.  °You may resume a sexual relationship in one month or when given the OK by your doctor.  °You may return to work once you are cleared by your doctor.  °Do not drive a car for 6 weeks or until released by you surgeon.  °Do not drive while taking narcotics. ° °WEIGHT BEARING °Weight bearing as tolerated with assist device (walker, cane, etc) as directed, use it as long as suggested by your surgeon or therapist, typically at least 4-6 weeks. ° °POSTOPERATIVE CONSTIPATION PROTOCOL °Constipation - defined medically as fewer than three stools per week and severe constipation as less than one stool per week. ° °One of the most common issues patients have following surgery is constipation.  Even if you have a regular bowel pattern at home, your normal regimen is likely to be disrupted due to multiple reasons following surgery.  Combination of anesthesia, postoperative narcotics, change in appetite and fluid intake all can affect your bowels.    In order to avoid complications following surgery, here are some recommendations in order to help you during your recovery period. ° °Colace (docusate) - Pick up an over-the-counter form of Colace or another stool softener and take twice a day as long as you are requiring postoperative pain medications.  Take with a full glass of water daily.  If you experience loose stools or diarrhea, hold the colace until you stool forms back  up.  If your symptoms do not get better within 1 week or if they get worse, check with your doctor. ° °Dulcolax (bisacodyl) - Pick up over-the-counter and take as directed by the product packaging as needed to assist with the movement of your bowels.  Take with a full glass of water.  Use this product as needed if not relieved by Colace only.  ° °MiraLax (polyethylene glycol) - Pick up over-the-counter to have on hand.  MiraLax is a solution that will increase the amount of water in your bowels to assist with bowel movements.  Take as directed and can mix with a glass of water, juice, soda, coffee, or tea.  Take if you go more than two days without a movement. °Do not use MiraLax more than once per day. Call your doctor if you are still constipated or irregular after using this medication for 7 days in a row. ° °If you continue to have problems with postoperative constipation, please contact the office for further assistance and recommendations.  If you experience "the worst abdominal pain ever" or develop nausea or vomiting, please contact the office immediatly for further recommendations for treatment. ° °ITCHING ° If you experience itching with your medications, try taking only a single pain pill, or even half a pain pill at a time.  You can also use Benadryl over the counter for itching or also to help with sleep.  ° °TED HOSE STOCKINGS °Wear the elastic stockings on both legs for three weeks following surgery during the day but you may remove then at night for sleeping. ° °MEDICATIONS °See your medication summary on the “After Visit Summary” that the nursing staff will review with you prior to discharge.  You may have some home medications which will be placed on hold until you complete the course of blood thinner medication.  It is important for you to complete the blood thinner medication as prescribed by your surgeon.  Continue your approved medications as instructed at time of discharge. ° °PRECAUTIONS °If  you experience chest pain or shortness of breath - call 911 immediately for transfer to the hospital emergency department.  °If you develop a fever greater that 101 F, purulent drainage from wound, increased redness or drainage from wound, foul odor from the wound/dressing, or calf pain - CONTACT YOUR SURGEON.   °                                                °FOLLOW-UP APPOINTMENTS °Make sure you keep all of your appointments after your operation with your surgeon and caregivers. You should call the office at the above phone number and make an appointment for approximately two weeks after the date of your surgery or on the date instructed by your surgeon outlined in the "After Visit Summary". ° ° °RANGE OF MOTION AND STRENGTHENING EXERCISES  °Rehabilitation of the knee is important following a knee injury or   an operation. After just a few days of immobilization, the muscles of the thigh which control the knee become weakened and shrink (atrophy). Knee exercises are designed to build up the tone and strength of the thigh muscles and to improve knee motion. Often times heat used for twenty to thirty minutes before working out will loosen up your tissues and help with improving the range of motion but do not use heat for the first two weeks following surgery. These exercises can be done on a training (exercise) mat, on the floor, on a table or on a bed. Use what ever works the best and is most comfortable for you Knee exercises include:  °• Leg Lifts - While your knee is still immobilized in a splint or cast, you can do straight leg raises. Lift the leg to 60 degrees, hold for 3 sec, and slowly lower the leg. Repeat 10-20 times 2-3 times daily. Perform this exercise against resistance later as your knee gets better.  °• Quad and Hamstring Sets - Tighten up the muscle on the front of the thigh (Quad) and hold for 5-10 sec. Repeat this 10-20 times hourly. Hamstring sets are done by pushing the foot backward against an  object and holding for 5-10 sec. Repeat as with quad sets.  °· Leg Slides: Lying on your back, slowly slide your foot toward your buttocks, bending your knee up off the floor (only go as far as is comfortable). Then slowly slide your foot back down until your leg is flat on the floor again. °· Angel Wings: Lying on your back spread your legs to the side as far apart as you can without causing discomfort.  °A rehabilitation program following serious knee injuries can speed recovery and prevent re-injury in the future due to weakened muscles. Contact your doctor or a physical therapist for more information on knee rehabilitation.  ° °IF YOU ARE TRANSFERRED TO A SKILLED REHAB FACILITY °If the patient is transferred to a skilled rehab facility following release from the hospital, a list of the current medications will be sent to the facility for the patient to continue.  When discharged from the skilled rehab facility, please have the facility set up the patient's Home Health Physical Therapy prior to being released. Also, the skilled facility will be responsible for providing the patient with their medications at time of release from the facility to include their pain medication, the muscle relaxants, and their blood thinner medication. If the patient is still at the rehab facility at time of the two week follow up appointment, the skilled rehab facility will also need to assist the patient in arranging follow up appointment in our office and any transportation needs. ° °MAKE SURE YOU:  °• Understand these instructions.  °• Get help right away if you are not doing well or get worse.  ° ° °Pick up stool softner and laxative for home use following surgery while on pain medications. °Do not submerge incision under water. °Please use good hand washing techniques while changing dressing each day. °May shower starting three days after surgery. °Please use a clean towel to pat the incision dry following showers. °Continue to  use ice for pain and swelling after surgery. °Do not use any lotions or creams on the incision until instructed by your surgeon. ° °

## 2019-01-10 NOTE — Anesthesia Postprocedure Evaluation (Signed)
Anesthesia Post Note  Patient: Barbara Jackson  Procedure(s) Performed: TOTAL KNEE ARTHROPLASTY (Right )     Patient location during evaluation: PACU Anesthesia Type: Spinal Level of consciousness: oriented and awake and alert Pain management: pain level controlled Vital Signs Assessment: post-procedure vital signs reviewed and stable Respiratory status: spontaneous breathing, respiratory function stable and nonlabored ventilation Cardiovascular status: blood pressure returned to baseline and stable Postop Assessment: no headache, no backache, no apparent nausea or vomiting, spinal receding and patient able to bend at knees Anesthetic complications: no    Last Vitals:  Vitals:   01/10/19 1215 01/10/19 1230  BP: 106/68 113/64  Pulse: (!) 58 (!) 59  Resp: 12 14  Temp: 36.5 C   SpO2: 98% 99%    Last Pain:  Vitals:   01/10/19 1230  TempSrc:   PainSc: 5                  Barbara Jackson A.

## 2019-01-10 NOTE — Anesthesia Procedure Notes (Signed)
Spinal  Patient location during procedure: OR Start time: 01/10/2019 9:41 AM End time: 01/10/2019 9:44 AM Staffing Anesthesiologist: Josephine Igo, MD Performed: anesthesiologist  Preanesthetic Checklist Completed: patient identified, site marked, surgical consent, pre-op evaluation, timeout performed, IV checked, risks and benefits discussed and monitors and equipment checked Spinal Block Patient position: sitting Prep: site prepped and draped and DuraPrep Patient monitoring: heart rate, cardiac monitor, continuous pulse ox and blood pressure Approach: midline Location: L3-4 Injection technique: single-shot Needle Needle type: Pencan  Needle gauge: 24 G Needle length: 9 cm Needle insertion depth: 7 cm Assessment Sensory level: T4 Additional Notes Patient tolerated procedure well. Adequate sensory level.

## 2019-01-10 NOTE — Transfer of Care (Signed)
Immediate Anesthesia Transfer of Care Note  Patient: Barbara Jackson  Procedure(s) Performed: TOTAL KNEE ARTHROPLASTY (Right )  Patient Location: PACU  Anesthesia Type:Spinal  Level of Consciousness: awake, alert  and oriented  Airway & Oxygen Therapy: Patient Spontanous Breathing and Patient connected to face mask oxygen  Post-op Assessment: Report given to RN and Post -op Vital signs reviewed and stable  Post vital signs: Reviewed and stable  Last Vitals:  Vitals Value Taken Time  BP    Temp    Pulse 65 01/10/19 1112  Resp 21 01/10/19 1113  SpO2 99 % 01/10/19 1112  Vitals shown include unvalidated device data.  Last Pain:  Vitals:   01/10/19 0853  TempSrc:   PainSc: 0-No pain      Patients Stated Pain Goal: 5 (25/27/12 9290)  Complications: No apparent anesthesia complications

## 2019-01-10 NOTE — Progress Notes (Signed)
AssistedDr. Foster with right, ultrasound guided, adductor canal block. Side rails up, monitors on throughout procedure. See vital signs in flow sheet. Tolerated Procedure well.  

## 2019-01-10 NOTE — Interval H&P Note (Signed)
History and Physical Interval Note:  01/10/2019 8:23 AM  Barbara Jackson  has presented today for surgery, with the diagnosis of right knee osteoarthritis.  The various methods of treatment have been discussed with the patient and family. After consideration of risks, benefits and other options for treatment, the patient has consented to  Procedure(s) with comments: TOTAL KNEE ARTHROPLASTY (Right) - 44min as a surgical intervention.  The patient's history has been reviewed, patient examined, no change in status, stable for surgery.  I have reviewed the patient's chart and labs.  Questions were answered to the patient's satisfaction.     Pilar Plate Ryleigh Buenger

## 2019-01-10 NOTE — Anesthesia Preprocedure Evaluation (Signed)
Anesthesia Evaluation  Patient identified by MRN, date of birth, ID band Patient awake    Reviewed: Allergy & Precautions, NPO status , Patient's Chart, lab work & pertinent test results, reviewed documented beta blocker date and time   Airway Mallampati: II  TM Distance: >3 FB Neck ROM: Full    Dental no notable dental hx. (+) Teeth Intact   Pulmonary sleep apnea and Continuous Positive Airway Pressure Ventilation ,    Pulmonary exam normal breath sounds clear to auscultation       Cardiovascular hypertension, Pt. on medications Normal cardiovascular exam+ dysrhythmias  Rhythm:Regular Rate:Normal     Neuro/Psych Anxiety negative neurological ROS     GI/Hepatic Neg liver ROS, hiatal hernia, GERD  Medicated and Controlled,  Endo/Other  diabetes, Well Controlled, Type 2, Oral Hypoglycemic AgentsMorbid obesityHyperlipidemia  Renal/GU negative Renal ROS  negative genitourinary   Musculoskeletal  (+) Arthritis , Osteoarthritis,  OA Right knee   Abdominal (+) + obese,   Peds  Hematology negative hematology ROS (+)   Anesthesia Other Findings   Reproductive/Obstetrics                             Anesthesia Physical Anesthesia Plan  ASA: III  Anesthesia Plan: Spinal   Post-op Pain Management:  Regional for Post-op pain   Induction: Intravenous  PONV Risk Score and Plan: 3 and Ondansetron, Propofol infusion, Treatment may vary due to age or medical condition and Midazolam  Airway Management Planned: Natural Airway, Nasal Cannula and Simple Face Mask  Additional Equipment:   Intra-op Plan:   Post-operative Plan:   Informed Consent: I have reviewed the patients History and Physical, chart, labs and discussed the procedure including the risks, benefits and alternatives for the proposed anesthesia with the patient or authorized representative who has indicated his/her understanding and  acceptance.     Dental advisory given  Plan Discussed with: CRNA and Surgeon  Anesthesia Plan Comments:         Anesthesia Quick Evaluation

## 2019-01-10 NOTE — Plan of Care (Signed)
Plan of care 

## 2019-01-10 NOTE — Op Note (Signed)
OPERATIVE REPORT-TOTAL KNEE ARTHROPLASTY   Pre-operative diagnosis- Osteoarthritis  Right knee(s)  Post-operative diagnosis- Osteoarthritis Right knee(s)  Procedure-  Right  Total Knee Arthroplasty  Surgeon- Barbara Plover. Zakk Borgen, MD  Assistant- Theresa Duty, PA-C   Anesthesia-  Adductor canal block and spinal  EBL- 25 ml   Drains Hemovac  Tourniquet time-  Total Tourniquet Time Documented: Thigh (Right) - 36 minutes Total: Thigh (Right) - 36 minutes     Complications- None  Condition-PACU - hemodynamically stable.   Brief Clinical Note  Barbara Jackson is a 63 y.o. year old female with end stage OA of her right knee with progressively worsening pain and dysfunction. She has constant pain, with activity and at rest and significant functional deficits with difficulties even with ADLs. She has had extensive non-op management including analgesics, injections of cortisone and viscosupplements, and home exercise program, but remains in significant pain with significant dysfunction.Radiographs show bone on bone arthritis medial and patellofemoral. She presents now for right Total Knee Arthroplasty.    Procedure in detail---   The patient is brought into the operating room and positioned supine on the operating table. After successful administration of   Adductor canal block and spinal,   a tourniquet is placed high on the  Right thigh(s) and the lower extremity is prepped and draped in the usual sterile fashion. Time out is performed by the operating team and then the  Right lower extremity is wrapped in Esmarch, knee flexed and the tourniquet inflated to 300 mmHg.       A midline incision is made with a ten blade through the subcutaneous tissue to the level of the extensor mechanism. A fresh blade is used to make a medial parapatellar arthrotomy. Soft tissue over the proximal medial tibia is subperiosteally elevated to the joint line with a knife and into the semimembranosus bursa  with a Cobb elevator. Soft tissue over the proximal lateral tibia is elevated with attention being paid to avoiding the patellar tendon on the tibial tubercle. The patella is everted, knee flexed 90 degrees and the ACL and PCL are removed. Findings are bone on bone medial and patellofemoral with large global osteophytes.        The drill is used to create a starting hole in the distal femur and the canal is thoroughly irrigated with sterile saline to remove the fatty contents. The 5 degree Right  valgus alignment guide is placed into the femoral canal and the distal femoral cutting block is pinned to remove 9 mm off the distal femur. Resection is made with an oscillating saw.      The tibia is subluxed forward and the menisci are removed. The extramedullary alignment guide is placed referencing proximally at the medial aspect of the tibial tubercle and distally along the second metatarsal axis and tibial crest. The block is pinned to remove 89mm off the more deficient medial  side. Resection is made with an oscillating saw. Size 5is the most appropriate size for the tibia and the proximal tibia is prepared with the modular drill and keel punch for that size.      The femoral sizing guide is placed and size 5 is most appropriate. Rotation is marked off the epicondylar axis and confirmed by creating a rectangular flexion gap at 90 degrees. The size 5 cutting block is pinned in this rotation and the anterior, posterior and chamfer cuts are made with the oscillating saw. The intercondylar block is then placed and that cut is made.  Trial size 5 tibial component, trial size 5 posterior stabilized femur and a 14  mm posterior stabilized rotating platform insert trial is placed. Full extension is achieved with excellent varus/valgus and anterior/posterior balance throughout full range of motion. The patella is everted and thickness measured to be 22  mm. Free hand resection is taken to 12 mm, a 35 template is  placed, lug holes are drilled, trial patella is placed, and it tracks normally. Osteophytes are removed off the posterior femur with the trial in place. All trials are removed and the cut bone surfaces prepared with pulsatile lavage. Cement is mixed and once ready for implantation, the size 5 tibial implant, size  5 posterior stabilized femoral component, and the size 35 patella are cemented in place and the patella is held with the clamp. The trial insert is placed and the knee held in full extension. The Exparel (20 ml mixed with 60 ml saline) is injected into the extensor mechanism, posterior capsule, medial and lateral gutters and subcutaneous tissues.  All extruded cement is removed and once the cement is hard the permanent 14 mm posterior stabilized rotating platform insert is placed into the tibial tray.      The wound is copiously irrigated with saline solution and the extensor mechanism closed over a hemovac drain with #1 V-loc suture. The tourniquet is released for a total tourniquet time of 36  minutes. Flexion against gravity is 140 degrees and the patella tracks normally. Subcutaneous tissue is closed with 2.0 vicryl and subcuticular with running 4.0 Monocryl. The incision is cleaned and dried and steri-strips and a bulky sterile dressing are applied. The limb is placed into a knee immobilizer and the patient is awakened and transported to recovery in stable condition.      Please note that a surgical assistant was a medical necessity for this procedure in order to perform it in a safe and expeditious manner. Surgical assistant was necessary to retract the ligaments and vital neurovascular structures to prevent injury to them and also necessary for proper positioning of the limb to allow for anatomic placement of the prosthesis.   Barbara Plover [name], MD    01/10/2019, 10:47 AM

## 2019-01-11 ENCOUNTER — Encounter (HOSPITAL_COMMUNITY): Payer: Self-pay | Admitting: Orthopedic Surgery

## 2019-01-11 DIAGNOSIS — M1711 Unilateral primary osteoarthritis, right knee: Secondary | ICD-10-CM | POA: Diagnosis not present

## 2019-01-11 LAB — BASIC METABOLIC PANEL
Anion gap: 8 (ref 5–15)
BUN: 15 mg/dL (ref 8–23)
CO2: 26 mmol/L (ref 22–32)
Calcium: 8.3 mg/dL — ABNORMAL LOW (ref 8.9–10.3)
Chloride: 102 mmol/L (ref 98–111)
Creatinine, Ser: 0.9 mg/dL (ref 0.44–1.00)
GFR calc Af Amer: >60 mL/min (ref >60)
GFR calc non Af Amer: >60 mL/min (ref >60)
Glucose, Bld: 161 mg/dL — ABNORMAL HIGH (ref 70–99)
Potassium: 3.4 mmol/L — ABNORMAL LOW (ref 3.5–5.1)
Sodium: 136 mmol/L (ref 135–145)

## 2019-01-11 LAB — GLUCOSE, CAPILLARY
Glucose-Capillary: 126 mg/dL — ABNORMAL HIGH (ref 70–99)
Glucose-Capillary: 146 mg/dL — ABNORMAL HIGH (ref 70–99)
Glucose-Capillary: 199 mg/dL — ABNORMAL HIGH (ref 70–99)
Glucose-Capillary: 132 mg/dL — ABNORMAL HIGH (ref 70–99)

## 2019-01-11 LAB — CBC
HCT: 36.7 % (ref 36.0–46.0)
Hemoglobin: 11.7 g/dL — ABNORMAL LOW (ref 12.0–15.0)
MCH: 27.8 pg (ref 26.0–34.0)
MCHC: 31.9 g/dL (ref 30.0–36.0)
MCV: 87.2 fL (ref 80.0–100.0)
Platelets: 386 10*3/uL (ref 150–400)
RBC: 4.21 MIL/uL (ref 3.87–5.11)
RDW: 13.7 % (ref 11.5–15.5)
WBC: 13 10*3/uL — ABNORMAL HIGH (ref 4.0–10.5)
nRBC: 0 % (ref 0.0–0.2)

## 2019-01-11 MED ORDER — GABAPENTIN 300 MG PO CAPS: 300.0000 mg | ORAL_CAPSULE | Freq: Three times a day (TID) | ORAL | 0 refills | Status: DC

## 2019-01-11 MED ORDER — HYDROMORPHONE HCL 2 MG PO TABS
4.0000 mg | ORAL_TABLET | ORAL | Status: DC | PRN
  Administered 2019-01-11 – 2019-01-12 (×3): 4 mg via ORAL
  Filled 2019-01-11 (×3): qty 2

## 2019-01-11 MED ORDER — ASPIRIN 325 MG PO TBEC: 325.0000 mg | DELAYED_RELEASE_TABLET | Freq: Two times a day (BID) | ORAL | 0 refills | Status: AC

## 2019-01-11 MED ORDER — METHOCARBAMOL 500 MG PO TABS: 500.0000 mg | ORAL_TABLET | Freq: Four times a day (QID) | ORAL | 0 refills | Status: DC | PRN

## 2019-01-11 MED ORDER — POTASSIUM CHLORIDE CRYS ER 20 MEQ PO TBCR
40.0000 meq | EXTENDED_RELEASE_TABLET | Freq: Once | ORAL | Status: AC
  Administered 2019-01-12: 40 meq via ORAL
  Filled 2019-01-11: qty 2

## 2019-01-11 MED ORDER — HYDROMORPHONE HCL 2 MG PO TABS
2.0000 mg | ORAL_TABLET | ORAL | Status: DC | PRN
  Administered 2019-01-11 (×2): 2 mg via ORAL
  Filled 2019-01-11 (×2): qty 1

## 2019-01-11 MED ORDER — HYDROMORPHONE HCL 2 MG PO TABS: 2.0000 mg | ORAL_TABLET | Freq: Four times a day (QID) | ORAL | 0 refills | Status: DC | PRN

## 2019-01-11 NOTE — Progress Notes (Signed)
Physical Therapy Treatment Patient Details Name: Barbara Jackson MRN: [id number] DOB: 1955-11-20 Today's Date: 01/11/2019    History of Present Illness 63 yo female s/p R TKR on 01/10/19. PMH includes obesity, HLD, L TKR, OSA, HTN, DMII, anxiety.    PT Comments    POD # 1 pm session Assisted OOB to amb to bathroom then in hallway.  Pt progressing slowly with issues of pain and mild anxiety.  Unable to practice stairs.  Pt would benefit from another day and D/C tomorrow am.    Follow Up Recommendations  Follow surgeon's recommendation for DC plan and follow-up therapies;Supervision for mobility/OOB     Equipment Recommendations  None recommended by PT    Recommendations for Other Services       Precautions / Restrictions Precautions Precautions: Fall;Knee Precaution Comments: instructed on KI use fro amb and stairs for increased support Required Braces or Orthoses: Knee Immobilizer - Right Knee Immobilizer - Right: On when out of bed or walking;Discontinue once straight leg raise with < 10 degree lag Restrictions Weight Bearing Restrictions: No Other Position/Activity Restrictions: WBAT    Mobility  Bed Mobility Overal bed mobility: Needs Assistance Bed Mobility: Supine to Sit;Sit to Supine     Supine to sit: Min assist Sit to supine: Mod assist   General bed mobility comments: 50% VC's on proper tech and increased time.  Demonstarted and instructed how to use belt to self assist R LE off bed then increased assist back to bed  Transfers Overall transfer level: Needs assistance Equipment used: Rolling walker (2 wheeled) Transfers: Sit to/from [facility] Sit to Stand: Min assist Stand pivot transfers: Min assist;Mod assist       General transfer comment: 50% VC's on proper hand placement and safety with turns   Also assisted with toilet transfer.  Ambulation/Gait Ambulation/Gait assistance: Min assist;Mod assist Gait Distance (Feet): 38  Feet Assistive device: Rolling walker (2 wheeled) Gait Pattern/deviations: Step-to pattern;Decreased step length - right;Decreased stance time - right;Decreased weight shift to right;Trunk flexed;Antalgic Gait velocity: decreased   General Gait Details: 25% VC's on proper walker to self distance and safety with turns in bathroom.   Stairs             Wheelchair Mobility    Modified Rankin (Stroke Patients Only)       Balance                                            Cognition Arousal/Alertness: Awake/alert Behavior During Therapy: Anxious Overall Cognitive Status: Within Functional Limits for tasks assessed                                        Exercises      General Comments        Pertinent Vitals/Pain Pain Assessment: 0-10 Pain Score: 7  Pain Location: R knee Pain Descriptors / Indicators: Sore;Tender;Tightness;Discomfort;Grimacing Pain Intervention(s): Monitored during session;Premedicated before session;Repositioned;Ice applied    Home Living                      Prior Function            PT Goals (current goals can now be found in the care plan section) Progress towards PT goals: Progressing toward goals  Frequency    7X/week      PT Plan Current plan remains appropriate    Co-evaluation              AM-PAC PT "6 Clicks" Mobility   Outcome Measure  Help needed turning from your back to your side while in a flat bed without using bedrails?: A Little Help needed moving from lying on your back to sitting on the side of a flat bed without using bedrails?: A Little Help needed moving to and from a bed to a chair (including a wheelchair)?: A Little Help needed standing up from a chair using your arms (e.g., wheelchair or bedside chair)?: A Little Help needed to walk in hospital room?: A Lot Help needed climbing 3-5 steps with a railing? : A Lot 6 Click Score: 16    End of Session  Equipment Utilized During Treatment: Gait belt;Right knee immobilizer Activity Tolerance: Patient limited by pain;Other (comment)(emotional state) Patient left: in bed;with bed alarm set Nurse Communication: Mobility status PT Visit Diagnosis: Other abnormalities of gait and mobility (R26.89);Difficulty in walking, not elsewhere classified (R26.2)     Time: 9741-6384 PT Time Calculation (min) (ACUTE ONLY): 24 min  Charges:  $Gait Training: 8-22 mins $Therapeutic Activity: 8-22 mins                     Rica Koyanagi  PTA Acute  Rehabilitation Services Pager      [phone] Office      [phone]

## 2019-01-11 NOTE — Progress Notes (Signed)
   Subjective: 1 Day Post-Op Procedure(s) (LRB): TOTAL KNEE ARTHROPLASTY (Right) Patient reports pain as moderate.   Patient seen in rounds by Dr. Wynelle Link. Patient is slightly anxious this AM during rounds, endorsing high levels of pain in the right knee despite use of oxycodone. Denies chest pain, SOB, or calf pain. Foley catheter removed.  We will continue therapy today.   Objective: Vital signs in last 24 hours: Temp:  [97.6 F (36.4 C)-98.2 F (36.8 C)] 97.8 F (36.6 C) (07/28 0530) Pulse Rate:  [52-71] 60 (07/28 0530) Resp:  [8-22] 16 (07/28 0530) BP: (100-149)/(54-91) 133/79 (07/28 0530) SpO2:  [93 %-100 %] 98 % (07/28 0530)  Intake/Output from previous day:  Intake/Output Summary (Last 24 hours) at 01/11/2019 0751 Last data filed at 01/11/2019 0631 Gross per 24 hour  Intake 4158.08 ml  Output 1915 ml  Net 2243.08 ml    Labs: Recent Labs    01/11/19 0249  HGB 11.7*   Recent Labs    01/11/19 0249  WBC 13.0*  RBC 4.21  HCT 36.7  PLT 386   Recent Labs    01/11/19 0249  NA 136  K 3.4*  CL 102  CO2 26  BUN 15  CREATININE 0.90  GLUCOSE 161*  CALCIUM 8.3*   Exam: General - Patient is Alert and Oriented Extremity - Neurologically intact Neurovascular intact Sensation intact distally Dorsiflexion/Plantar flexion intact Dressing - dressing C/D/I Motor Function - intact, moving foot and toes well on exam.   Past Medical History:  Diagnosis Date  . Abnormal pap 1998   Negative Hpv  . Anxiety   . Arthritis   . Diabetes mellitus   . Dysrhythmia    hx of atrial tachycardia and pacs  . Esophageal reflux   . H/O hiatal hernia   . H/O iron deficiency anemia 08/2016  . Hypercholesteremia   . Hypertension   . Obesity    compulsive overeater  . Osteoarthritis     Assessment/Plan: 1 Day Post-Op Procedure(s) (LRB): TOTAL KNEE ARTHROPLASTY (Right) Principal Problem:   Primary osteoarthritis of right knee Active Problems:   Osteoarthritis of right  knee  Estimated body mass index is 39.61 kg/m as calculated from the following:   Height as of this encounter: 5\' 5"  (1.651 m).   Weight as of this encounter: 108 kg. Advance diet Up with therapy D/C IV fluids  Anticipated LOS equal to or greater than 2 midnights due to - Age 63 and older with one or more of the following:  - Obesity  - Expected need for hospital services (PT, OT, Nursing) required for safe  discharge  - Anticipated need for postoperative skilled nursing care or inpatient rehab  - Active co-morbidities: Diabetes OR   - Unanticipated findings during/Post Surgery: None  - Patient is a high risk of re-admission due to: None    DVT Prophylaxis - Aspirin Weight bearing as tolerated. D/C O2 and pulse ox and try on room air. Hemovac pulled without difficulty, will continue therapy today.  PO pain medication switched to hydromorphone 2-4 mg Q4. Potassium dropped from 3.5 preoperatively to 3.4, one dose of 40 mEq KCl ordered.   Plan is to go Home after hospital stay. Possible discharge this afternoon if she is meeting her goals with therapy and pain is managed with PO medications.  Scheduled for outpatient physical therapy [facility]. Follow-up in the office in 2 weeks.   Theresa Duty, PA-C Orthopedic Surgery 01/11/2019, 7:51 AM

## 2019-01-11 NOTE — Progress Notes (Signed)
Physical Therapy Treatment Patient Details Name: Barbara Jackson MRN: [id number] DOB: [birth year] Today's Date: 01/11/2019    History of Present Illness 63 yo female s/p R TKR on 01/10/19. PMH includes obesity, HLD, L TKR, OSA, HTN, DMII, anxiety.    PT Comments    POD # 1 am session Applied KI. Instructed on proper application and use for amb/stairs for increased support "for the first few days".   Assisted OOB.  General bed mobility comments: 50% VC's on proper tech and increased time.  Demonstarted and instructed how to use belt to self assist R LE off bed.  General transfer comment: 50% VC's on proper hand placement and safety with turns   Pt present with mild anxiety and fear of causing "more pain". General Gait Details: 50% VC's on proper walker to self distance and proper sequencing.  Required increased time and recliner to follow for safety.  Pt is anxious and "worried" about activity increasing her pain. Then returned to room to perform some TE's following HEP handout.  Instructed on proper tech, freq as well as use of ICE.     Follow Up Recommendations  Follow surgeon's recommendation for DC plan and follow-up therapies;Supervision for mobility/OOB     Equipment Recommendations  None recommended by PT    Recommendations for Other Services       Precautions / Restrictions Precautions Precautions: Fall;Knee Precaution Comments: instructed on KI use fro amb and stairs for increased support Required Braces or Orthoses: Knee Immobilizer - Right Knee Immobilizer - Right: On when out of bed or walking;Discontinue once straight leg raise with < 10 degree lag Restrictions Weight Bearing Restrictions: No Other Position/Activity Restrictions: WBAT    Mobility  Bed Mobility Overal bed mobility: Needs Assistance Bed Mobility: Supine to Sit     Supine to sit: Min assist     General bed mobility comments: 50% VC's on proper tech and increased time.  Demonstarted and instructed  how to use belt to self assist R LE off bed  Transfers Overall transfer level: Needs assistance Equipment used: Rolling walker (2 wheeled) Transfers: Sit to/from [facility] Sit to Stand: Min assist Stand pivot transfers: Min assist;Mod assist       General transfer comment: 50% VC's on proper hand placement and safety with turns   Pt present with mild anxiety and fear of causing "more pain".  Ambulation/Gait Ambulation/Gait assistance: Min assist;Mod assist Gait Distance (Feet): 22 Feet Assistive device: Rolling walker (2 wheeled) Gait Pattern/deviations: Step-to pattern;Decreased step length - right;Decreased stance time - right;Decreased weight shift to right;Trunk flexed;Antalgic Gait velocity: decreased   General Gait Details: 50% VC's on proper walker to self distance and proper sequencing.  Required increased time and recliner to follow for safety.  Pt is anxious and "worried" about activity increasing her pain.   Stairs             Wheelchair Mobility    Modified Rankin (Stroke Patients Only)       Balance                                            Cognition Arousal/Alertness: Awake/alert Behavior During Therapy: Anxious Overall Cognitive Status: Within Functional Limits for tasks assessed  Exercises      General Comments        Pertinent Vitals/Pain Pain Assessment: 0-10 Pain Score: 7  Pain Location: R knee Pain Descriptors / Indicators: Sore;Tender;Tightness;Discomfort;Grimacing Pain Intervention(s): Monitored during session;Premedicated before session;Repositioned;Ice applied    Home Living                      Prior Function            PT Goals (current goals can now be found in the care plan section) Progress towards PT goals: Progressing toward goals    Frequency    7X/week      PT Plan Current plan remains appropriate     Co-evaluation              AM-PAC PT "6 Clicks" Mobility   Outcome Measure  Help needed turning from your back to your side while in a flat bed without using bedrails?: A Little Help needed moving from lying on your back to sitting on the side of a flat bed without using bedrails?: A Little Help needed moving to and from a bed to a chair (including a wheelchair)?: A Little Help needed standing up from a chair using your arms (e.g., wheelchair or bedside chair)?: A Little Help needed to walk in hospital room?: A Lot Help needed climbing 3-5 steps with a railing? : A Lot 6 Click Score: 16    End of Session Equipment Utilized During Treatment: Gait belt;Right knee immobilizer Activity Tolerance: Patient limited by pain;Other (comment)(emotional state) Patient left: in chair;with chair alarm set;with call bell/phone within reach;with SCD's reapplied Nurse Communication: Mobility status PT Visit Diagnosis: Other abnormalities of gait and mobility (R26.89);Difficulty in walking, not elsewhere classified (R26.2)     Time: 2878-6767 PT Time Calculation (min) (ACUTE ONLY): 26 min  Charges:  $Gait Training: 8-22 mins $Therapeutic Exercise: 8-22 mins                     Rica Koyanagi  PTA Acute  Rehabilitation Services Pager      [phone] Office      [phone]

## 2019-01-11 NOTE — Progress Notes (Signed)
Patient stated she may want to stay in the hospital one more night. Heart rate in the 50's.

## 2019-01-11 NOTE — Progress Notes (Signed)
Patient expressed concern about painful lump on top of right foot.

## 2019-01-11 NOTE — TOC Progression Note (Signed)
Transition of Care [facility]) - Progression Note    Patient Details  Name: Barbara Jackson MRN: [id number] Date of Birth: 15-Nov-1955  [facility] CM/SW [place], LCSW Phone Number: 01/11/2019, 10:36 AM  Clinical Narrative:    Therapy Plan: Outpatient PT [facility]  Has RW, 3 in2 Ordered through [facility] and delivered to the patient room     Barriers to Discharge: No Barriers Identified  Expected Discharge Plan and Services  Home          Expected Discharge Date: 01/11/19               DME Arranged: 3-N-1 DME Agency: [employer] Date DME Agency Contacted: 01/11/19 Time DME Agency Contacted: 0930 Representative spoke with at DME Agency: [place] ([place]) Interventions    Readmission Risk Interventions No flowsheet data found.

## 2019-01-12 DIAGNOSIS — M1711 Unilateral primary osteoarthritis, right knee: Secondary | ICD-10-CM | POA: Diagnosis not present

## 2019-01-12 LAB — GLUCOSE, CAPILLARY
Glucose-Capillary: 121 mg/dL — ABNORMAL HIGH (ref 70–99)
Glucose-Capillary: 151 mg/dL — ABNORMAL HIGH (ref 70–99)

## 2019-01-12 LAB — BASIC METABOLIC PANEL
Anion gap: 12 (ref 5–15)
BUN: 14 mg/dL (ref 8–23)
CO2: 27 mmol/L (ref 22–32)
Calcium: 8.5 mg/dL — ABNORMAL LOW (ref 8.9–10.3)
Chloride: 99 mmol/L (ref 98–111)
Creatinine, Ser: 0.76 mg/dL (ref 0.44–1.00)
GFR calc Af Amer: >60 mL/min (ref >60)
GFR calc non Af Amer: >60 mL/min (ref >60)
Glucose, Bld: 139 mg/dL — ABNORMAL HIGH (ref 70–99)
Potassium: 3 mmol/L — ABNORMAL LOW (ref 3.5–5.1)
Sodium: 138 mmol/L (ref 135–145)

## 2019-01-12 LAB — CBC
HCT: 37.3 % (ref 36.0–46.0)
Hemoglobin: 12.2 g/dL (ref 12.0–15.0)
MCH: 28.3 pg (ref 26.0–34.0)
MCHC: 32.7 g/dL (ref 30.0–36.0)
MCV: 86.5 fL (ref 80.0–100.0)
Platelets: 417 10*3/uL — ABNORMAL HIGH (ref 150–400)
RBC: 4.31 MIL/uL (ref 3.87–5.11)
RDW: 13.9 % (ref 11.5–15.5)
WBC: 12.5 10*3/uL — ABNORMAL HIGH (ref 4.0–10.5)
nRBC: 0 % (ref 0.0–0.2)

## 2019-01-12 NOTE — Progress Notes (Signed)
Physical Therapy Treatment Patient Details Name: Barbara Jackson MRN: [id number] DOB: August 04, 1955 Today's Date: 01/12/2019    History of Present Illness 63 yo female s/p R TKR on 01/10/19. PMH includes obesity, HLD, L TKR, OSA, HTN, DMII, anxiety.    PT Comments    POD # 2 am session Pt reported she slept better last night.  Still  A lot pain.  Assisted OOB to amb to bathroom, then in hallway to practice one step.  General bed mobility comments: 25% VC's on proper tech and increased time.  Demonstarted and instructed how to use belt to self assist R LE off bed.  General transfer comment: <25% VC's on proper hand placement and safety with turns   Also assisted with toilet transfer. General Gait Details: <25% VC's on proper walker to self distance and safety with turns in bathroom. General stair comments: 25% VC's on proper tech and sequencing.  Also instructed to wear KI for increased support. Then returned to room to perform some TE's following HEP handout.  Instructed on proper tech, freq as well as use of ICE.   Addressed all mobility questions, discussed appropriate activity, educated on use of ICE.  Pt ready for D/C to home.   Follow Up Recommendations  Follow surgeon's recommendation for DC plan and follow-up therapies;Supervision for mobility/OOB     Equipment Recommendations  None recommended by PT    Recommendations for Other Services       Precautions / Restrictions Precautions Precautions: Fall;Knee Precaution Comments: instructed on KI use fro amb and stairs for increased support Required Braces or Orthoses: Knee Immobilizer - Right Knee Immobilizer - Right: On when out of bed or walking;Discontinue once straight leg raise with < 10 degree lag Restrictions Weight Bearing Restrictions: No Other Position/Activity Restrictions: WBAT    Mobility  Bed Mobility Overal bed mobility: Needs Assistance Bed Mobility: Supine to Sit     Supine to sit: Min guard     General  bed mobility comments: 25% VC's on proper tech and increased time.  Demonstarted and instructed how to use belt to self assist R LE off bed  Transfers Overall transfer level: Needs assistance Equipment used: Rolling walker (2 wheeled) Transfers: Sit to/from [facility] Sit to Stand: Supervision;Min guard Stand pivot transfers: Supervision;Min guard       General transfer comment: <25% VC's on proper hand placement and safety with turns   Also assisted with toilet transfer.  Ambulation/Gait Ambulation/Gait assistance: Supervision;Min guard Gait Distance (Feet): 45 Feet Assistive device: Rolling walker (2 wheeled) Gait Pattern/deviations: Step-to pattern;Decreased step length - right;Decreased stance time - right;Decreased weight shift to right;Trunk flexed;Antalgic Gait velocity: decreased   General Gait Details: <25% VC's on proper walker to self distance and safety with turns in bathroom.   Stairs Stairs: Yes Stairs assistance: Min guard;Min assist Stair Management: No rails;Step to pattern;Forwards;With walker Number of Stairs: 1 General stair comments: 25% VC's on proper tech and sequencing.  Also instructed to wear KI for increased support.   Wheelchair Mobility    Modified Rankin (Stroke Patients Only)       Balance                                            Cognition Arousal/Alertness: Awake/alert Behavior During Therapy: WFL for tasks assessed/performed Overall Cognitive Status: Within Functional Limits for tasks assessed  Exercises   Total Knee Replacement TE's 10 reps B LE ankle pumps 10 reps towel squeezes 10 reps knee presses 10 reps heel slides  10 reps SAQ's 10 reps SLR's 10 reps ABD Followed by ICE     General Comments        Pertinent Vitals/Pain Pain Assessment: 0-10 Pain Score: 8  Pain Location: R knee with TE's Pain Descriptors / Indicators:  Sore;Tender;Tightness;Discomfort;Grimacing Pain Intervention(s): Monitored during session;Premedicated before session;Repositioned;Ice applied    Home Living                      Prior Function            PT Goals (current goals can now be found in the care plan section) Progress towards PT goals: Progressing toward goals    Frequency    7X/week      PT Plan Current plan remains appropriate    Co-evaluation              AM-PAC PT "6 Clicks" Mobility   Outcome Measure  Help needed turning from your back to your side while in a flat bed without using bedrails?: A Little Help needed moving from lying on your back to sitting on the side of a flat bed without using bedrails?: A Little Help needed moving to and from a bed to a chair (including a wheelchair)?: A Little Help needed standing up from a chair using your arms (e.g., wheelchair or bedside chair)?: A Little Help needed to walk in hospital room?: A Little Help needed climbing 3-5 steps with a railing? : A Little 6 Click Score: 18    End of Session Equipment Utilized During Treatment: Gait belt;Right knee immobilizer Activity Tolerance: Patient tolerated treatment well Patient left: in chair;with chair alarm set;with call bell/phone within reach Nurse Communication: Mobility status(pt ready for D/C to home) PT Visit Diagnosis: Other abnormalities of gait and mobility (R26.89);Difficulty in walking, not elsewhere classified (R26.2)     Time: 1009-1100 PT Time Calculation (min) (ACUTE ONLY): 51 min  Charges:  $Gait Training: 8-22 mins $Therapeutic Exercise: 8-22 mins $Therapeutic Activity: 8-22 mins                     Rica Koyanagi  PTA Acute  Rehabilitation Services Pager      [phone] Office      [phone]

## 2019-01-12 NOTE — Progress Notes (Signed)
   Subjective: 2 Days Post-Op Procedure(s) (LRB): TOTAL KNEE ARTHROPLASTY (Right) Patient reports pain as mild.   Patient seen in rounds by Dr. Wynelle Link. Patient is well, and has had no acute complaints or problems other than pain in the right knee. Pain improved with switch to hydromorphone. No acute events overnight. States she is ready to go home today. Denies CP, SHOB, calf pain.  Plan is to go Home after hospital stay.  Objective: Vital signs in last 24 hours: Temp:  [97.8 F (36.6 C)-98.2 F (36.8 C)] 97.8 F (36.6 C) (07/29 0650) Pulse Rate:  [51-64] 59 (07/29 0650) Resp:  [15-16] 16 (07/29 0650) BP: (124-136)/(63-87) 131/66 (07/29 0650) SpO2:  [93 %-99 %] 99 % (07/29 0650)  Intake/Output from previous day:  Intake/Output Summary (Last 24 hours) at 01/12/2019 0708 Last data filed at 01/11/2019 2300 Gross per 24 hour  Intake 840 ml  Output 1300 ml  Net -460 ml    Intake/Output this shift: No intake/output data recorded.  Labs: Recent Labs    01/11/19 0249  HGB 11.7*   Recent Labs    01/11/19 0249  WBC 13.0*  RBC 4.21  HCT 36.7  PLT 386   Recent Labs    01/11/19 0249  NA 136  K 3.4*  CL 102  CO2 26  BUN 15  CREATININE 0.90  GLUCOSE 161*  CALCIUM 8.3*   No results for input(s): LABPT, INR in the last 72 hours.  Exam: General - Patient is Alert and Oriented Extremity - Neurologically intact Sensation intact distally Intact pulses distally Dorsiflexion/Plantar flexion intact Dressing/Incision - clean, dry Motor Function - intact, moving foot and toes well on exam.   Past Medical History:  Diagnosis Date  . Abnormal pap 1998   Negative Hpv  . Anxiety   . Arthritis   . Diabetes mellitus   . Dysrhythmia    hx of atrial tachycardia and pacs  . Esophageal reflux   . H/O hiatal hernia   . H/O iron deficiency anemia 08/2016  . Hypercholesteremia   . Hypertension   . Obesity    compulsive overeater  . Osteoarthritis     Assessment/Plan:  2 Days Post-Op Procedure(s) (LRB): TOTAL KNEE ARTHROPLASTY (Right) Principal Problem:   Primary osteoarthritis of right knee Active Problems:   Osteoarthritis of right knee  Estimated body mass index is 39.61 kg/m as calculated from the following:   Height as of this encounter: 5\' 5"  (1.651 m).   Weight as of this encounter: 108 kg. Advance diet Up with therapy D/C IV fluids  DVT Prophylaxis - Aspirin Weight-bearing as tolerated  Plan for discharge today after 1-2 sessions of PT as long as she continues to meet goals. Scheduled for OPPT with [facility]. Follow up in the office in 2 weeks.  Griffith Citron, PA-C Orthopedic Surgery 01/12/2019, 7:08 AM

## 2019-01-17 DIAGNOSIS — M25661 Stiffness of right knee, not elsewhere classified: Secondary | ICD-10-CM | POA: Insufficient documentation

## 2019-02-15 DIAGNOSIS — Z5189 Encounter for other specified aftercare: Secondary | ICD-10-CM | POA: Insufficient documentation

## 2019-02-15 DIAGNOSIS — Z96651 Presence of right artificial knee joint: Secondary | ICD-10-CM | POA: Insufficient documentation

## 2019-02-23 ENCOUNTER — Other Ambulatory Visit: Payer: Self-pay

## 2019-02-23 ENCOUNTER — Telehealth: Payer: Self-pay | Admitting: Obstetrics & Gynecology

## 2019-02-23 ENCOUNTER — Encounter: Payer: Self-pay | Admitting: Obstetrics & Gynecology

## 2019-02-23 ENCOUNTER — Ambulatory Visit: Payer: BC Managed Care – PPO | Admitting: Obstetrics & Gynecology

## 2019-02-23 VITALS — BP 136/80 | HR 96 | Temp 97.6°F | Ht 65.0 in | Wt 233.0 lb

## 2019-02-23 DIAGNOSIS — R3915 Urgency of urination: Secondary | ICD-10-CM | POA: Diagnosis not present

## 2019-02-23 DIAGNOSIS — N3090 Cystitis, unspecified without hematuria: Secondary | ICD-10-CM | POA: Diagnosis not present

## 2019-02-23 LAB — POCT URINALYSIS DIPSTICK
Bilirubin, UA: NEGATIVE
Glucose, UA: NEGATIVE
Ketones, UA: NEGATIVE
Nitrite, UA: POSITIVE
Protein, UA: POSITIVE — AB
Urobilinogen, UA: 0.2 E.U./dL
pH, UA: 5 (ref 5.0–8.0)

## 2019-02-23 MED ORDER — NITROFURANTOIN MONOHYD MACRO 100 MG PO CAPS: 100.0000 mg | ORAL_CAPSULE | Freq: Two times a day (BID) | ORAL | 0 refills | Status: DC

## 2019-02-23 NOTE — Progress Notes (Signed)
GYNECOLOGY  VISIT  CC:   Urgency x 3 days  HPI: 63 y.o. G1P1001 Single White or Caucasian female here for urinary urgency.  Doing well.  Had knee replacement six weeks ago with Dr. Maureen Ralphs.  Had some post op pain issues.  Pain medications didn't really work for her at all post op.  Reports she started having issues with her bladder on Monday.  This started as some mild dysuria.  Denies hematuria.  Denies back pain.  Denies fever.     GYNECOLOGIC HISTORY: Patient's last menstrual period was 06/16/2005. Contraception: PMP Menopausal hormone therapy: none  Patient Active Problem List   Diagnosis Date Noted  . Osteoarthritis of right knee 01/10/2019  . Obesity with serious comorbidity 09/16/2018  . Hyperlipidemia 08/17/2018  . History of total left knee replacement 08/05/2017  . Open angle with borderline findings and low glaucoma risk in both eyes 04/27/2017  . Obstructive sleep apnea 07/08/2016  . Hypertension   . Type 2 diabetes mellitus with hyperglycemia, without long-term current use of insulin ([place])   . Primary osteoarthritis of right knee 01/12/2012    Past Medical History:  Diagnosis Date  . Abnormal pap 1998   Negative Hpv  . Anxiety   . Arthritis   . Diabetes mellitus   . Dysrhythmia    hx of atrial tachycardia and pacs  . Esophageal reflux   . H/O hiatal hernia   . H/O iron deficiency anemia 08/2016  . Hypercholesteremia   . Hypertension   . Obesity    compulsive overeater  . Osteoarthritis     Past Surgical History:  Procedure Laterality Date  . APPENDECTOMY  1975  . TOTAL KNEE ARTHROPLASTY  01/12/2012   Procedure: TOTAL KNEE ARTHROPLASTY;  Surgeon: Gearlean Alf, MD;  Location: [facility];  Service: Orthopedics;  Laterality: Left;  . TOTAL KNEE ARTHROPLASTY Right 01/10/2019   Procedure: TOTAL KNEE ARTHROPLASTY;  Surgeon: Gaynelle Arabian, MD;  Location: [facility];  Service: Orthopedics;  Laterality: Right;  45mn    MEDS:   Current Outpatient Medications on File  Prior to Visit  Medication Sig Dispense Refill  . albuterol (PROAIR HFA) 108 (90 Base) MCG/ACT inhaler Inhale 2 puffs into the lungs every 6 (six) hours as needed for wheezing or shortness of breath.     .Marland Kitchenatorvastatin (LIPITOR) 20 MG tablet Take 20 mg by mouth daily with supper.   0  . Blood Glucose Monitoring Suppl (ONE TOUCH ULTRA 2) w/Device KIT Use to check blood sugar 2-3 times a day 1 each 0  . cyclobenzaprine (FLEXERIL) 10 MG tablet TAKE 1 TABLET 3 TIMES A DAY AS NEEDED FOR MUSCLE SPASM    . diazepam (VALIUM) 5 MG tablet Take 5-10 mg by mouth every 8 (eight) hours as needed for anxiety.    . diphenhydrAMINE (BENADRYL ALLERGY) 25 mg capsule Take 25 mg by mouth every 6 (six) hours as needed for allergies.     . fluticasone (FLONASE) 50 MCG/ACT nasal spray Place 2 sprays into both nostrils daily as needed for allergies.     .Marland KitchenFREESTYLE TEST STRIPS test strip   5  . glipiZIDE (GLUCOTROL XL) 5 MG 24 hr tablet Take 1 tablet (5 mg total) by mouth daily. 90 tablet 3  . HYDROcodone-acetaminophen (NORCO/VICODIN) 5-325 MG tablet TAKE 1 TO 2 TABLETS BY MOUTH AT BEDTIME AS NEEDED FOR PAIN    . Iron-FA-B Cmp-C-Biot-Probiotic (FUSION PLUS) CAPS Take 1 capsule by mouth daily.     .Marland Kitchenlosartan-hydrochlorothiazide (HYZAAR)  100-25 MG per tablet Take 1 tablet by mouth every morning.    . metFORMIN (GLUCOPHAGE-XR) 500 MG 24 hr tablet Take 4 tablets (2,000 mg total) by mouth daily with supper. 360 tablet 2  . methocarbamol (ROBAXIN) 500 MG tablet Take 1 tablet (500 mg total) by mouth every 6 (six) hours as needed for muscle spasms. 40 tablet 0  . omeprazole (PRILOSEC) 40 MG capsule Take 40 mg by mouth daily before breakfast.    . oxybutynin (DITROPAN) 5 MG tablet Take 1 tablet (5 mg total) by mouth 3 (three) times daily. (Patient taking differently: Take 5 mg by mouth every 8 (eight) hours as needed for bladder spasms. ) 90 tablet 12  . oxyCODONE (OXY IR/ROXICODONE) 5 MG immediate release tablet Take 5-10 mg by  mouth every 6 (six) hours as needed. for pain    . potassium chloride SA (K-DUR) 20 MEQ tablet Take 20 mEq by mouth daily.     Marland Kitchen Propylene Glycol (SYSTANE COMPLETE OP) Place 1 drop into both eyes 2 (two) times daily as needed (dry eyes).    . Semaglutide, 1 MG/DOSE, 2 MG/1.5ML SOPN Inject 1 mg into the skin once a week. 6 pen 3  . sertraline (ZOLOFT) 50 MG tablet Take 50 mg by mouth daily with supper.    . zolpidem (AMBIEN) 10 MG tablet Take 10 mg by mouth at bedtime as needed for sleep.      No current facility-administered medications on file prior to visit.     ALLERGIES: Diclofenac, Glucotrol [glipizide], Lisinopril, and Adhesive [tape]  Family History  Problem Relation Age of Onset  . Heart attack Father        Deceased, 22  . COPD Father   . Breast cancer Mother 2       Deceased, 93  . Obesity Sister   . Hypertension Brother   . Healthy Son     SH:  Single, non smoker  Review of Systems  Genitourinary: Positive for urgency.  All other systems reviewed and are negative.   PHYSICAL EXAMINATION:    BP 136/80   Pulse 96   Temp 97.6 F (36.4 C) (Temporal)   Ht '5\' 5"'  (1.651 m)   Wt 233 lb (105.7 kg)   LMP 06/16/2005   BMI 38.77 kg/m     General appearance: alert, cooperative and appears stated age Flank:  No CVA tenderness Abdomen: soft, non-tender; bowel sounds normal; no masses,  no organomegaly Lymph:  no inguinal LAD noted  Pelvic: External genitalia:  no lesions              Urethra:  normal appearing urethra with no masses, tenderness or lesions              Bartholins and Skenes: normal                 Vagina: normal appearing vagina with normal color and discharge, no lesions               Chaperone was present for exam.  Assessment: Cystitis  Plan: Urine culture and micro pending. Macrobid 141m bid x 5 days.

## 2019-02-23 NOTE — Telephone Encounter (Signed)
VOID

## 2019-02-24 LAB — URINALYSIS, MICROSCOPIC ONLY
Casts: NONE SEEN /lpf
WBC, UA: >30 /hpf — AB (ref 0–5)

## 2019-02-26 LAB — URINE CULTURE

## 2019-02-26 MED ORDER — CIPROFLOXACIN HCL 500 MG PO TABS: 500.0000 mg | ORAL_TABLET | Freq: Two times a day (BID) | ORAL | 0 refills | Status: DC

## 2019-02-26 NOTE — Addendum Note (Signed)
Addended by: Megan Salon on: 02/26/2019 07:22 PM   Modules accepted: Orders

## 2019-03-07 ENCOUNTER — Ambulatory Visit (INDEPENDENT_AMBULATORY_CARE_PROVIDER_SITE_OTHER): Payer: BC Managed Care – PPO | Admitting: *Deleted

## 2019-03-07 ENCOUNTER — Other Ambulatory Visit: Payer: Self-pay

## 2019-03-07 DIAGNOSIS — N3090 Cystitis, unspecified without hematuria: Secondary | ICD-10-CM | POA: Diagnosis not present

## 2019-03-07 NOTE — Progress Notes (Signed)
Patient here for follow up UC and micro. Patient states she did not take Cipro. Denies urinary symptoms today. Clean catch urine provided and sent for urine culture and micro. Patient advised would be updated when results back and reviewed by Dr. Sabra Heck.   Routing to provider and will close encounter.

## 2019-03-08 LAB — URINALYSIS, MICROSCOPIC ONLY: Casts: NONE SEEN /lpf

## 2019-03-10 ENCOUNTER — Telehealth: Payer: Self-pay | Admitting: Obstetrics & Gynecology

## 2019-03-10 ENCOUNTER — Encounter: Payer: Self-pay | Admitting: Obstetrics & Gynecology

## 2019-03-10 LAB — URINE CULTURE

## 2019-03-10 NOTE — Telephone Encounter (Signed)
Patient sent the following message through [place]. Routing to triage to assist patient with request.  Barbara Jackson "J[facility]  Phone Number: [phone]        Hello, I expect you all will let me know the results of my urine test on 9/21? Thanks, Barbara Jackson

## 2019-03-10 NOTE — Telephone Encounter (Signed)
Notes recorded by Megan Salon, MD on 03/09/2019 at 9:20 PM EDT  Please let pt know her urine culture is still positive for bacteria. I would really like her to take the antibiotics that were given on 02/23/19. Please confirm that she will take the antibiotics. Probably needs repeat urine culture after they are finished. Should wait one week after finishing abx prior to repeat culture.

## 2019-03-10 NOTE — Telephone Encounter (Signed)
Spoke with patient, advised of results as seen below per Dr. Sabra Heck. Patient states she will start Cipro abx today, will plan to repeat UC at [facility] on 03/22/19. Patient verbalizes understanding and is agreeable to plan.   Routing to provider for final review. Patient is agreeable to disposition. Will close encounter.

## 2019-03-11 ENCOUNTER — Telehealth: Payer: Self-pay | Admitting: Obstetrics & Gynecology

## 2019-03-11 NOTE — Telephone Encounter (Signed)
Call to patient. Reports increased urine "production" that is different for her. Currently on Cipro.  Is not drinking an increased amount. Blood glucose is normal for her = 140's. Denies pain, burning, back pain or fever with the increased voiding.  Voids normally when she feels the urge to void approximately every 2 hours. Has a slight "chill" at end of urine stream.  Advised monitor, observe and complete medication. Call back if any change in symptoms. Advised MD will review call and we will call back if any additional instructions.

## 2019-03-11 NOTE — Telephone Encounter (Signed)
Patient left voicemail during lunch stating that she has questions regarding her UTI.

## 2019-03-11 NOTE — Telephone Encounter (Signed)
Called pt.  Discussed with pt she's gone at least two weeks without treatment so I'm not surprised that she's having some urinary symptoms.  However, this should fully resolve with the antibiotics.  She will finish the three days of cipro and I gave her my number to call if she needs me this weekend esp if symptoms worsen or do not fully resolve.  Voiced appreciation of phone call.  Ok to close encounter.

## 2019-03-18 ENCOUNTER — Encounter: Payer: Self-pay | Admitting: Obstetrics & Gynecology

## 2019-03-18 ENCOUNTER — Other Ambulatory Visit: Payer: Self-pay

## 2019-03-18 ENCOUNTER — Other Ambulatory Visit: Payer: Self-pay | Admitting: Obstetrics & Gynecology

## 2019-03-18 MED ORDER — SULFAMETHOXAZOLE-TRIMETHOPRIM 800-160 MG PO TABS: 1.0000 | ORAL_TABLET | Freq: Two times a day (BID) | ORAL | 0 refills | Status: DC

## 2019-03-22 ENCOUNTER — Other Ambulatory Visit: Payer: Self-pay

## 2019-03-22 ENCOUNTER — Encounter: Payer: Self-pay | Admitting: Obstetrics & Gynecology

## 2019-03-22 ENCOUNTER — Ambulatory Visit (INDEPENDENT_AMBULATORY_CARE_PROVIDER_SITE_OTHER): Payer: BC Managed Care – PPO | Admitting: Obstetrics & Gynecology

## 2019-03-22 VITALS — BP 122/80 | HR 80 | Temp 97.0°F | Ht 65.0 in | Wt 233.0 lb

## 2019-03-22 DIAGNOSIS — Z23 Encounter for immunization: Secondary | ICD-10-CM

## 2019-03-22 DIAGNOSIS — Z01419 Encounter for gynecological examination (general) (routine) without abnormal findings: Secondary | ICD-10-CM

## 2019-03-22 DIAGNOSIS — N3090 Cystitis, unspecified without hematuria: Secondary | ICD-10-CM | POA: Diagnosis not present

## 2019-03-22 DIAGNOSIS — K257 Chronic gastric ulcer without hemorrhage or perforation: Secondary | ICD-10-CM

## 2019-03-22 NOTE — Progress Notes (Signed)
64 y.o. G86P1001 Single White or Caucasian female here for annual exam.  Denies vaginal bleeding.  Urinary symptoms have resolved.  Did give urine sample today.  Had total knee replacement done in July.  She has a [name] lesion that is possibly causing a small amount of slow GI bleeding.  She is being followed by Dr. Collene Mares.  She is on omeprazole and iron.  Iron and hb have been stable.  Had blood work earlier this year.  PCP:  Dr. Orland Mustard.  She is going to have this done at her next appt.  Patient's last menstrual period was 06/16/2005.          Sexually active: No.  The current method of family planning is post menopausal status.    Exercising: Yes.    swim, walking, bike Smoker:  no  Health Maintenance: Pap:  12/04/17 Neg. HR HPV:neg   02/02/15 Neg. HR HPV:neg  History of abnormal Pap:  yes MMG:  03/25/16 [place] left BIRADS2:benign. F/u 1 year.  She is overdue for this.   Colonoscopy:  11/12/16 f/u 5 years BMD:   Never TDaP:  2017 Pneumonia vaccine(s):  Done  Shingrix:   Declines today Flu vaccine: here today  Hep C testing: 12/04/17 neg  Screening Labs: done    reports that she has never smoked. She has never used smokeless tobacco. She reports current alcohol use of about 2.0 standard drinks of alcohol per week. She reports that she does not use drugs.  Past Medical History:  Diagnosis Date  . Abnormal pap 1998   Negative Hpv  . Anxiety   . Arthritis   . Diabetes mellitus   . Dysrhythmia    hx of atrial tachycardia and pacs  . Esophageal reflux   . H/O hiatal hernia   . H/O iron deficiency anemia 08/2016  . Hypercholesteremia   . Hypertension   . Obesity    compulsive overeater  . Osteoarthritis     Past Surgical History:  Procedure Laterality Date  . APPENDECTOMY  1975  . TOTAL KNEE ARTHROPLASTY  01/12/2012   Procedure: TOTAL KNEE ARTHROPLASTY;  Surgeon: Gearlean Alf, MD;  Location: [facility];  Service: Orthopedics;  Laterality: Left;  . TOTAL KNEE ARTHROPLASTY Right  01/10/2019   Procedure: TOTAL KNEE ARTHROPLASTY;  Surgeon: Gaynelle Arabian, MD;  Location: [facility];  Service: Orthopedics;  Laterality: Right;  70mn    Current Outpatient Medications  Medication Sig Dispense Refill  . albuterol (PROAIR HFA) 108 (90 Base) MCG/ACT inhaler Inhale 2 puffs into the lungs every 6 (six) hours as needed for wheezing or shortness of breath.     .Marland Kitchenatorvastatin (LIPITOR) 20 MG tablet Take 20 mg by mouth daily with supper.   0  . Blood Glucose Monitoring Suppl (ONE TOUCH ULTRA 2) w/Device KIT Use to check blood sugar 2-3 times a day 1 each 0  . cyclobenzaprine (FLEXERIL) 10 MG tablet TAKE 1 TABLET 3 TIMES A DAY AS NEEDED FOR MUSCLE SPASM    . diazepam (VALIUM) 5 MG tablet Take 5-10 mg by mouth every 8 (eight) hours as needed for anxiety.    . diphenhydrAMINE (BENADRYL ALLERGY) 25 mg capsule Take 25 mg by mouth every 6 (six) hours as needed for allergies.     . fluticasone (FLONASE) 50 MCG/ACT nasal spray Place 2 sprays into both nostrils daily as needed for allergies.     .Marland KitchenFREESTYLE TEST STRIPS test strip   5  . glipiZIDE (GLUCOTROL XL) 5 MG 24  hr tablet Take 1 tablet (5 mg total) by mouth daily. 90 tablet 3  . Iron-FA-B Cmp-C-Biot-Probiotic (FUSION PLUS) CAPS Take 1 capsule by mouth daily.     Marland Kitchen losartan-hydrochlorothiazide (HYZAAR) 100-25 MG per tablet Take 1 tablet by mouth every morning.    . metFORMIN (GLUCOPHAGE-XR) 500 MG 24 hr tablet Take 4 tablets (2,000 mg total) by mouth daily with supper. 360 tablet 2  . omeprazole (PRILOSEC) 40 MG capsule Take 40 mg by mouth daily before breakfast.    . oxybutynin (DITROPAN) 5 MG tablet Take 1 tablet (5 mg total) by mouth 3 (three) times daily. (Patient taking differently: Take 5 mg by mouth every 8 (eight) hours as needed for bladder spasms. ) 90 tablet 12  . potassium chloride SA (K-DUR) 20 MEQ tablet Take 20 mEq by mouth daily.     Marland Kitchen Propylene Glycol (SYSTANE COMPLETE OP) Place 1 drop into both eyes 2 (two) times daily as  needed (dry eyes).    . Semaglutide, 1 MG/DOSE, 2 MG/1.5ML SOPN Inject 1 mg into the skin once a week. 6 pen 3  . sertraline (ZOLOFT) 50 MG tablet Take 50 mg by mouth daily with supper.    . zolpidem (AMBIEN) 10 MG tablet Take 10 mg by mouth at bedtime as needed for sleep.      No current facility-administered medications for this visit.     Family History  Problem Relation Age of Onset  . Heart attack Father        Deceased, 31  . COPD Father   . Breast cancer Mother 54       Deceased, 23  . Obesity Sister   . Hypertension Brother   . Healthy Son     Review of Systems  All other systems reviewed and are negative.   Exam:   BP 122/80   Pulse 80   Temp (!) 97 F (36.1 C) (Temporal)   Ht '5\' 5"'  (1.651 m)   Wt 233 lb (105.7 kg)   LMP 06/16/2005   BMI 38.77 kg/m    Height: '5\' 5"'  (165.1 cm)  Ht Readings from Last 3 Encounters:  03/22/19 '5\' 5"'  (1.651 m)  03/07/19 '5\' 5"'  (1.651 m)  02/23/19 '5\' 5"'  (1.651 m)    General appearance: alert, cooperative and appears stated age Head: Normocephalic, without obvious abnormality, atraumatic Neck: no adenopathy, supple, symmetrical, trachea midline and thyroid normal to inspection and palpation Lungs: clear to auscultation bilaterally Breasts: normal appearance, no masses or tenderness Heart: regular rate and rhythm Abdomen: soft, non-tender; bowel sounds normal; no masses,  no organomegaly Extremities: extremities normal, atraumatic, no cyanosis or edema Skin: Skin color, texture, turgor normal. No rashes or lesions Lymph nodes: Cervical, supraclavicular, and axillary nodes normal. No abnormal inguinal nodes palpated Neurologic: Grossly normal   Pelvic: External genitalia:  no lesions              Urethra:  normal appearing urethra with no masses, tenderness or lesions              Bartholins and Skenes: normal                 Vagina: normal appearing vagina with normal color and discharge, no lesions              Cervix: no  lesions              Pap taken: No. Bimanual Exam:  Uterus:  normal size, contour, position, consistency, mobility, non-tender  Adnexa: normal adnexa and no mass, fullness, tenderness               Rectovaginal: Confirms               Anus:  normal sphincter tone, no lesions  Chaperone was present for exam.  A:  Well Woman with normal exam PMP, no HRT H/o OAB Type 2 DM H/o abnormal pap with neg HR HPV since 2011 H/o cystitis  P:   Mammogram guidelines reviewed.  She is aware this is due.  States she will do it this year pap smear with neg HR HPV 2019.  Not indicated today. Lab work UTD Colonoscopy UTD BMD will be done around age 16 Repeat urine culture obtained today Flu shot given today. Return annually or prn

## 2019-03-24 LAB — URINE CULTURE

## 2019-03-25 ENCOUNTER — Ambulatory Visit: Payer: BC Managed Care – PPO | Admitting: Obstetrics & Gynecology

## 2019-03-31 ENCOUNTER — Ambulatory Visit (INDEPENDENT_AMBULATORY_CARE_PROVIDER_SITE_OTHER): Payer: BC Managed Care – PPO | Admitting: Obstetrics & Gynecology

## 2019-03-31 ENCOUNTER — Encounter: Payer: Self-pay | Admitting: Obstetrics & Gynecology

## 2019-03-31 ENCOUNTER — Telehealth: Payer: Self-pay | Admitting: Obstetrics & Gynecology

## 2019-03-31 ENCOUNTER — Other Ambulatory Visit: Payer: Self-pay

## 2019-03-31 VITALS — BP 102/80 | HR 84 | Temp 97.3°F | Ht 65.0 in | Wt 234.0 lb

## 2019-03-31 DIAGNOSIS — N898 Other specified noninflammatory disorders of vagina: Secondary | ICD-10-CM | POA: Diagnosis not present

## 2019-03-31 DIAGNOSIS — R3915 Urgency of urination: Secondary | ICD-10-CM

## 2019-03-31 LAB — POCT URINALYSIS DIPSTICK
Bilirubin, UA: NEGATIVE
Blood, UA: NEGATIVE
Glucose, UA: NEGATIVE
Ketones, UA: NEGATIVE
Nitrite, UA: NEGATIVE
Protein, UA: NEGATIVE
Urobilinogen, UA: 0.2 E.U./dL
pH, UA: 5 (ref 5.0–8.0)

## 2019-03-31 MED ORDER — FLUCONAZOLE 150 MG PO TABS: ORAL_TABLET | ORAL | 0 refills | Status: DC

## 2019-03-31 MED ORDER — AMOXICILLIN-POT CLAVULANATE 875-125 MG PO TABS: 1.0000 | ORAL_TABLET | Freq: Two times a day (BID) | ORAL | 0 refills | Status: DC

## 2019-03-31 NOTE — Telephone Encounter (Signed)
Spoke with patient.   02/23/19 UC +Klebsiella pneumoniae 03/07/19 UC +Klebsiella pneumoniae  Rx Macrobid prescribed 9/9, changed to Cipro. Started cipro 03/10/19.  03/22/19 UC negative  Patient reports symptoms resolved after completion of Abx. Symptoms returned 3 days ago. Reports "slight chill" at end of stream, urgency, voiding small amounts. Denies fever, blood in urine, vaginal d/c, odor, flank pain. Advised OV needed for further evaluation. [id number] prescreen negative.   OV scheduled for today at 4pm with Dr. Sabra Heck.   Routing to provider for final review. Patient is agreeable to disposition. Will close encounter.

## 2019-03-31 NOTE — Progress Notes (Signed)
GYNECOLOGY  VISIT  CC:   Urinary urgency, end of stream chills, flushed  HPI: 63 y.o. G1P1001 Single White or Caucasian female here for urinary urgency x 3 days.  She is also having some flushing and a sensation of chills at the very end of her stream of urine.  Denies vaginal bleeding.    Had UTI diagnosed in September.  Initially, she declined taking antibiotics.  She then had a repeat culture and this was still positive so she did take Ciprofloxin for Klebsiella positive urine culture.  Repeat culture was performed due to pt's delay in taking antibiotics.  This was negative and obtained 03/22/2019.  She asks if the first infection was fully treated and if that is the reason she feels she has another UTI but the repeat culture was negative so I don't think that is the situation.    GYNECOLOGIC HISTORY: Patient's last menstrual period was 06/16/2005. Contraception: PMP Menopausal hormone therapy: none  Patient Active Problem List   Diagnosis Date Noted  . Cameron lesion, chronic   . Osteoarthritis of right knee 01/10/2019  . Obesity with serious comorbidity 09/16/2018  . Hyperlipidemia 08/17/2018  . History of total left knee replacement 08/05/2017  . Open angle with borderline findings and low glaucoma risk in both eyes 04/27/2017  . Obstructive sleep apnea 07/08/2016  . Hypertension   . Type 2 diabetes mellitus with hyperglycemia, without long-term current use of insulin ([place])   . Primary osteoarthritis of right knee 01/12/2012    Past Medical History:  Diagnosis Date  . Abnormal pap 1998   Negative Hpv  . Anxiety   . Arthritis   . Cameron lesion, chronic   . Diabetes mellitus   . Dysrhythmia    hx of atrial tachycardia and pacs  . Esophageal reflux   . H/O hiatal hernia   . H/O iron deficiency anemia 08/2016  . Hypercholesteremia   . Hypertension   . Obesity    compulsive overeater  . Osteoarthritis     Past Surgical History:  Procedure Laterality Date  .  APPENDECTOMY  1975  . TOTAL KNEE ARTHROPLASTY  01/12/2012   Procedure: TOTAL KNEE ARTHROPLASTY;  Surgeon: Gearlean Alf, MD;  Location: [facility];  Service: Orthopedics;  Laterality: Left;  . TOTAL KNEE ARTHROPLASTY Right 01/10/2019   Procedure: TOTAL KNEE ARTHROPLASTY;  Surgeon: Gaynelle Arabian, MD;  Location: [facility];  Service: Orthopedics;  Laterality: Right;  74mn    MEDS:   Current Outpatient Medications on File Prior to Visit  Medication Sig Dispense Refill  . albuterol (PROAIR HFA) 108 (90 Base) MCG/ACT inhaler Inhale 2 puffs into the lungs every 6 (six) hours as needed for wheezing or shortness of breath.     .Marland Kitchenatorvastatin (LIPITOR) 20 MG tablet Take 20 mg by mouth daily with supper.   0  . Blood Glucose Monitoring Suppl (ONE TOUCH ULTRA 2) w/Device KIT Use to check blood sugar 2-3 times a day 1 each 0  . cyclobenzaprine (FLEXERIL) 10 MG tablet TAKE 1 TABLET 3 TIMES A DAY AS NEEDED FOR MUSCLE SPASM    . diazepam (VALIUM) 5 MG tablet Take 5-10 mg by mouth every 8 (eight) hours as needed for anxiety.    . diphenhydrAMINE (BENADRYL ALLERGY) 25 mg capsule Take 25 mg by mouth every 6 (six) hours as needed for allergies.     . fluticasone (FLONASE) 50 MCG/ACT nasal spray Place 2 sprays into both nostrils daily as needed for allergies.     .Marland Kitchen  FREESTYLE TEST STRIPS test strip   5  . glipiZIDE (GLUCOTROL XL) 5 MG 24 hr tablet Take 1 tablet (5 mg total) by mouth daily. 90 tablet 3  . Iron-FA-B Cmp-C-Biot-Probiotic (FUSION PLUS) CAPS Take 1 capsule by mouth daily.     Marland Kitchen losartan-hydrochlorothiazide (HYZAAR) 100-25 MG per tablet Take 1 tablet by mouth every morning.    . metFORMIN (GLUCOPHAGE-XR) 500 MG 24 hr tablet Take 4 tablets (2,000 mg total) by mouth daily with supper. 360 tablet 2  . omeprazole (PRILOSEC) 40 MG capsule Take 40 mg by mouth daily before breakfast.    . oxybutynin (DITROPAN) 5 MG tablet Take 1 tablet (5 mg total) by mouth 3 (three) times daily. (Patient taking differently: Take 5  mg by mouth every 8 (eight) hours as needed for bladder spasms. ) 90 tablet 12  . potassium chloride SA (K-DUR) 20 MEQ tablet Take 20 mEq by mouth daily.     Marland Kitchen Propylene Glycol (SYSTANE COMPLETE OP) Place 1 drop into both eyes 2 (two) times daily as needed (dry eyes).    . Semaglutide, 1 MG/DOSE, 2 MG/1.5ML SOPN Inject 1 mg into the skin once a week. 6 pen 3  . sertraline (ZOLOFT) 50 MG tablet Take 50 mg by mouth daily with supper.    . zolpidem (AMBIEN) 10 MG tablet Take 10 mg by mouth at bedtime as needed for sleep.      No current facility-administered medications on file prior to visit.     ALLERGIES: Diclofenac, Glucotrol [glipizide], Lisinopril, and Adhesive [tape]  Family History  Problem Relation Age of Onset  . Heart attack Father        Deceased, 80  . COPD Father   . Breast cancer Mother 76       Deceased, 39  . Obesity Sister   . Hypertension Brother   . Healthy Son     SH:  Single, non smoker  Review of Systems  Constitutional: Positive for chills (after stream ).  Genitourinary: Positive for difficulty urinating and urgency.  All other systems reviewed and are negative.   PHYSICAL EXAMINATION:    BP 102/80   Pulse 84   Temp (!) 97.3 F (36.3 C) (Temporal)   Ht '5\' 5"'  (1.651 m)   Wt 234 lb (106.1 kg)   LMP 06/16/2005   BMI 38.94 kg/m     General appearance: alert, cooperative and appears stated age Flank:  No CVA tenderness Abdomen: soft, non-tender; bowel sounds normal; no masses,  no organomegaly Lymph:  no inguinal LAD noted  Pelvic: External genitalia:  no lesions              Urethra:  normal appearing urethra with no masses, tenderness or lesions              Bartholins and Skenes: normal                 Vagina: normal appearing vagina with normal color and discharge, no lesions              Cervix: no lesions              Bimanual Exam:  Uterus:  normal size, contour, position, consistency, mobility, non-tender              Adnexa: no mass,  fullness, tenderness  Chaperone was present for exam.  Assessment: WBCs in urine and dysuria with urinary urgency  Plan: Urine micro and culture pending Augmentin 842m bid x  5 days Vaginitis testing obtained Diflucan 138m po x 1, repeat in 72 hours if needed.

## 2019-03-31 NOTE — Telephone Encounter (Signed)
Patient is calling regarding getting a repeat urine culture. Patient stated that she feels like the UTI might "vaguely still be there." Patient stated that she is "just paranoid since this has been going on for so long."

## 2019-04-01 ENCOUNTER — Telehealth: Payer: Self-pay | Admitting: Obstetrics & Gynecology

## 2019-04-01 ENCOUNTER — Other Ambulatory Visit: Payer: Self-pay | Admitting: Obstetrics & Gynecology

## 2019-04-01 LAB — URINALYSIS, MICROSCOPIC ONLY
Casts: NONE SEEN /lpf
RBC, Urine: NONE SEEN /hpf (ref 0–2)
WBC, UA: >30 /hpf — AB (ref 0–5)

## 2019-04-01 LAB — VAGINITIS/VAGINOSIS, DNA PROBE
Candida Species: NEGATIVE
Gardnerella vaginalis: NEGATIVE
Trichomonas vaginosis: NEGATIVE

## 2019-04-01 MED ORDER — PHENAZOPYRIDINE HCL 100 MG PO TABS: 100.0000 mg | ORAL_TABLET | Freq: Three times a day (TID) | ORAL | 0 refills | Status: DC | PRN

## 2019-04-01 NOTE — Telephone Encounter (Signed)
Patient states she is having more urinary symptoms since her visit yesterday. Patient states she is having increased in urgency and also wants to know if this does not settle down, what she should do over the weekend. Please advise.

## 2019-04-01 NOTE — Telephone Encounter (Signed)
Call to patient. Patient notified that prescription for pyridium had been sent to pharmacy for her. Patient verbalized understanding and appreciative of phone call.  Will close encounter.

## 2019-04-01 NOTE — Telephone Encounter (Signed)
She can use pyridium which is a prescription or AZO which is OTC.  Both can be taken 3 times daily.  This typically helps urgency and dysuria as well.

## 2019-04-01 NOTE — Telephone Encounter (Signed)
Call to patient. Message given to patient as seen below from Dr. Sabra Heck and patient verbalized understanding. Patient requesting a prescription for pyridium so that she can drive through the pharmacy and pick up. Pharmacy confirmed as [facility] [address]. RN advised would send request to Dr. Sabra Heck. Patient agreeable and appreciative of phone call.   Routing to provider to review and advise on pyridium prescription.

## 2019-04-01 NOTE — Telephone Encounter (Signed)
RX completed for pyridium 100mg  tid prn urinary symptoms.  #15/0RF

## 2019-04-01 NOTE — Telephone Encounter (Signed)
Call to patient. Patient states about 1.5 hours ago she started noticing more urinary frequency. States "I constantly have that urge, but then theres nothing there." Started the Augmentin this am and took the diflucan about an hour ago. Patient states she is drinking plenty of water. Denies pelvic pain, back pain or fever. States she does feel like she has a chill at the end of her stream. Patient asking if there is anything Dr. Sabra Heck can recommend to help alleviate the constant urge to go? Also asking should her symptoms worsen over the weekend what she should do. Worsening symptoms/pain/ fever precautions reviewed for UC or ER care and patient verbalized understanding. Advised would review with Dr. Sabra Heck and return call with additional recommendations. Patient agreeable.   Routing to provider for review.

## 2019-04-03 ENCOUNTER — Encounter: Payer: Self-pay | Admitting: Obstetrics & Gynecology

## 2019-04-03 LAB — URINE CULTURE

## 2019-04-04 ENCOUNTER — Telehealth: Payer: Self-pay | Admitting: *Deleted

## 2019-04-04 DIAGNOSIS — N3090 Cystitis, unspecified without hematuria: Secondary | ICD-10-CM

## 2019-04-04 NOTE — Telephone Encounter (Signed)
Pt notified. Verbalized understanding. Patient states her symptoms are much improved. Nurse visit scheduled. Order placed.

## 2019-04-04 NOTE — Telephone Encounter (Signed)
-----   Message from Megan Salon, MD sent at 04/04/2019  7:25 AM EDT ----- I sent a prior message to not repeat the urine culture.  I think we should actually repeat it in two weeks.  Please schedule this for her.  Thanks.

## 2019-04-04 NOTE — Telephone Encounter (Signed)
LM for pt to call back.  Notes recorded by Megan Salon, MD on 04/03/2019 at 12:15 PM EDT  Please let pt know her urine culture was positive again and did show klebsiella again (the same bacteria as in the first urine culture). Her vaginitis testing was negative. She did have some calcium oxalate crystals in her urine. This is a risk for having kidney stones. She does need to try and get 64 oz water daily. Urine micro only showed WBCs so if symptoms resolve, does not need repeat urine culture. Thanks.

## 2019-04-11 ENCOUNTER — Telehealth: Payer: Self-pay | Admitting: *Deleted

## 2019-04-11 ENCOUNTER — Encounter: Payer: Self-pay | Admitting: Obstetrics & Gynecology

## 2019-04-11 NOTE — Telephone Encounter (Signed)
Barbara Jackson"  P [facility]  Phone Number: [phone],   On Saturday, I started wondering if I might still have (or have returning) a UTI.... I don't have the same feelings of urgency or the kind of flush/chill I felt before at end of stream, but kept feeling like I *might* start having them. Also I have felt, occasionally since Saturday, a mild pain ("pain" may be too strong a word) in lower, left pelvic area (just above crease between thigh and pelvis perhaps?)   Other than that, no strong urge to urinate, no discolored urine, etc.   I probably wouldn't even take note of these symptoms if I hadn't just gone through a month or more of UTI troubles!   I completed last round of antibiotics on October 19 and I have a re-test appointment next Tuesday, Nov 2.   Just not sure if I should do anything in meantime.   [name]  [phone]

## 2019-04-11 NOTE — Telephone Encounter (Signed)
Tx Klebsiella UTI 03/31/19  Spoke with patient. Patient denies any symptoms today and states she was unsure if she was feeling anything Saturday or Sunday. "I may have had a odd twinge in my lower abdomen and it has resolved".   Denies flank pain, dysuria, fever/chills, blood in urine, d/c or odor.   Advised patient to keep appt fo repeat UC as scheduled for 11/2. Continue to increase water intake daily. Return call to office if any new symptoms develop. Advised patient I will update Dr. Sabra Heck and return call if any additional recommendations. Patient agreeable.   Routing to Dr. Sabra Heck.

## 2019-04-12 ENCOUNTER — Ambulatory Visit: Payer: BC Managed Care – PPO

## 2019-04-12 ENCOUNTER — Other Ambulatory Visit: Payer: Self-pay

## 2019-04-12 VITALS — BP 122/76 | HR 76 | Temp 97.2°F | Resp 18 | Ht 65.0 in | Wt 232.6 lb

## 2019-04-12 DIAGNOSIS — N3090 Cystitis, unspecified without hematuria: Secondary | ICD-10-CM

## 2019-04-12 DIAGNOSIS — R3915 Urgency of urination: Secondary | ICD-10-CM

## 2019-04-12 NOTE — Telephone Encounter (Signed)
Spoke with pt. Pt states still  having urinary sx of fullness at end of stream and tingling in lower pelvis. Would like to give UC today. Rescheduled nurse visit from 11/2  to have UC today at 130pm. Pt agreeable.   Denies flank pain, dysuria, fever/chills, blood in urine, d/c or odor.   Will route to Dr Sabra Heck for any additional recommendations.  Routing to Dr Sabra Heck.

## 2019-04-12 NOTE — Progress Notes (Signed)
Pt is here for follow up from UTI. She states she completed the antibiotic but still has some discomfort at the end of urination. She also states she has some urgency and feels like she does not empty her bladder completely. Urine sent to lab for culture.

## 2019-04-12 NOTE — Telephone Encounter (Signed)
This is fine.  Thanks.  Ok to close encounter. 

## 2019-04-12 NOTE — Telephone Encounter (Signed)
Patient says she is having more urinary symptoms today and would like to come in for TOC.

## 2019-04-14 ENCOUNTER — Other Ambulatory Visit: Payer: Self-pay | Admitting: Obstetrics & Gynecology

## 2019-04-14 ENCOUNTER — Telehealth: Payer: Self-pay | Admitting: *Deleted

## 2019-04-14 MED ORDER — CIPROFLOXACIN HCL 500 MG PO TABS: 500.0000 mg | ORAL_TABLET | Freq: Two times a day (BID) | ORAL | 0 refills | Status: DC

## 2019-04-14 MED ORDER — ESTRADIOL 0.1 MG/GM VA CREA: TOPICAL_CREAM | VAGINAL | 1 refills | Status: DC

## 2019-04-14 NOTE — Telephone Encounter (Signed)
Addendum to previous entry: Patient states she has been unable to describe the sensation in pelvic area she has been experiencing, "feels like mild menses cramps". Wants to update Dr. Sabra Heck.

## 2019-04-14 NOTE — Telephone Encounter (Signed)
Spoke with patient, advised per Dr. Sabra Heck.  Nurse visit scheduled for 11/10 at 8:45am. Patient agreeable to vaginal estrogen.  Reviewed potential interaction b/w glipizide and cipro, hypoglycemia. Patient state she has taken cipro in the past, monitired blood sugars closely, stopped glipizide while taking.   Pharmacy confirmed. Cipro Rx pended.   Advised patient I will review estrogen and cipro Rx with Dr. Sabra Heck and return call to advise.    Dr. Sabra Heck -ok to proceed with Cipro? Please advise on vaginal estrogen RX.

## 2019-04-14 NOTE — Telephone Encounter (Signed)
Spoke with patient. Advised per Dr. Miller. Patient verbalizes understanding and is agreeable.  ° °Encounter closed.  °

## 2019-04-14 NOTE — Telephone Encounter (Signed)
Yes, ok to proceed with the cipro.  We've got to get this fully treated.  I do want her to stop the glipizide and watch blood sugars as she did before.    Rx for estrogen cream sent to pharmacy on file as well.

## 2019-04-14 NOTE — Telephone Encounter (Signed)
-----   Message from Megan Salon, MD sent at 04/14/2019  6:55 AM EDT ----- Please let pt know her urine culture is preliminarily positive.  Please start cipro 500mg  bid x 5 days.  Will need repeat urine culture in 1-2 weeks after antibiotics are completed.  If this happens again, I'm going to want her to see urology.  She needs to make sure she is getting 64 oz water every day and can consider starting a small amount of estrogen cream applied three times weekly around the urethra.  This has been shown in studies to decrease UTI symptoms.  I can put in order if she wants this.  If has lots of questions, scheduled video visit.  Thanks.

## 2019-04-16 LAB — URINE CULTURE

## 2019-04-18 ENCOUNTER — Ambulatory Visit: Payer: BC Managed Care – PPO

## 2019-04-19 ENCOUNTER — Encounter: Payer: Self-pay | Admitting: Obstetrics & Gynecology

## 2019-04-19 ENCOUNTER — Telehealth: Payer: Self-pay | Admitting: Obstetrics & Gynecology

## 2019-04-19 NOTE — Telephone Encounter (Signed)
Spoke with pt. Pt states having some intermittent pelvic aches and pain like menstrual cramps but states better since finishing Cipro yesterday(11/2)    Wants to know to be seen again with Dr Sabra Heck before repeat culture scheduled on 11/10 or to be referred? Will call pt with recommendations from Dr Sabra Heck. Pt agreeable.   Will route to Dr Sabra Heck for recommendations. Please advise.

## 2019-04-19 NOTE — Telephone Encounter (Signed)
Patient sent the following correspondence through [place].    Hello, just writing to report that I 1) finished Cipro yesterday, and 2) am still having some pelvic aches/pain (like menstrual cramps).   Im due for repeat culture next week but not sure if anything else Dr Sabra Heck thinks I should do.  Thanks Barbara Jackson

## 2019-04-19 NOTE — Telephone Encounter (Signed)
She does not need to see me before the culture.  We can repeat the culture as planned.  I will refer her if this last course of antibiotics doesn't do the trick and fix this completely.  Thanks.

## 2019-04-19 NOTE — Telephone Encounter (Signed)
Left message to call Colletta Maryland, RN at [facility]. Ok to leave detailed message per [facility].

## 2019-04-20 NOTE — Telephone Encounter (Signed)
Spoke with pt. Pt agrees with plan for OV scheduled for repeat culture on 11/10.  Will close encounter.

## 2019-04-21 ENCOUNTER — Telehealth: Payer: Self-pay | Admitting: Obstetrics & Gynecology

## 2019-04-21 ENCOUNTER — Encounter: Payer: Self-pay | Admitting: Obstetrics & Gynecology

## 2019-04-21 DIAGNOSIS — N390 Urinary tract infection, site not specified: Secondary | ICD-10-CM

## 2019-04-21 DIAGNOSIS — R3915 Urgency of urination: Secondary | ICD-10-CM

## 2019-04-21 DIAGNOSIS — N3090 Cystitis, unspecified without hematuria: Secondary | ICD-10-CM

## 2019-04-21 NOTE — Telephone Encounter (Signed)
Patient sent the following message through [facility]. Routing to triage to assist patient with request.  Elwin Sleight "[facility]  Phone Number: [phone]        Hello,   I know I said I didn't have any other questions when I talked to Colletta Maryland (I think it was?) yesterday but.... I've been worrying about my (presumed) UTI. I'm pretty sure I still have it as pelvic pain continues.   I understand infection can travel to the kidneys sometimes and that that's a more serious condition.   Just wondering if I should go ahead and try to schedule an appointment with a urologist rather than waiting till results come back from next week's repeat culture. That will occur next Thurs or Friday I guess, so another week, and if it takes awhile to get an appointment maybe it would be better to go ahead and try and get one.   Maybe a week won't make that much difference, but, you know, I'm worried!   Please advise, and feel free to reply here.   I'd also be interested to know if there are urologists in [place] who specialize in women's issues.   Thanks,  [name]

## 2019-04-21 NOTE — Telephone Encounter (Signed)
Patient is scheduled for repeat UC on 04/26/19.   Dr. Sabra Heck -please review and advise on urology referral.

## 2019-04-21 NOTE — Telephone Encounter (Signed)
It's totally fine for her to schedule with a urologist at this point.  If needs referral, please have her let me know.  She does not and has not had any indication of upper urinary tract infection (kidney as she mentioned in her message) which includes fever and/or flank pain.  Thanks.

## 2019-04-22 NOTE — Telephone Encounter (Signed)
Patient returned call. States she is scheduled with Dr. Matilde Sprang at [facility] on 05/18/19. Will keep repeat UC as scheduled. Requesting referral to [facility].   Advised referral placed. Will update Dr. Sabra Heck.   Routing to provider for final review. Patient is agreeable to disposition. Will close encounter.   CC: Barbara Jackson

## 2019-04-22 NOTE — Addendum Note (Signed)
Addended by: Burnice Logan on: 04/22/2019 10:18 AM   Modules accepted: Orders

## 2019-04-22 NOTE — Telephone Encounter (Signed)
Spoke with patient, advised per Dr. Sabra Heck. Reviewed options for urology in [place]. Patient will further review options and call directly to schedule. Patient will notify office if referral is needed. Will keep visit for repeat UC on 04/26/19, will cancel if she is able to see urology before. Patient verbalizes understanding.   Routing to provider for final review. Patient is agreeable to disposition. Will close encounter.

## 2019-04-25 ENCOUNTER — Encounter: Payer: Self-pay | Admitting: Internal Medicine

## 2019-04-25 ENCOUNTER — Ambulatory Visit (INDEPENDENT_AMBULATORY_CARE_PROVIDER_SITE_OTHER): Payer: BC Managed Care – PPO | Admitting: Internal Medicine

## 2019-04-25 ENCOUNTER — Other Ambulatory Visit: Payer: Self-pay

## 2019-04-25 VITALS — BP 110/80 | HR 84 | Ht 65.0 in | Wt 236.0 lb

## 2019-04-25 DIAGNOSIS — E669 Obesity, unspecified: Secondary | ICD-10-CM

## 2019-04-25 DIAGNOSIS — E785 Hyperlipidemia, unspecified: Secondary | ICD-10-CM

## 2019-04-25 DIAGNOSIS — E1165 Type 2 diabetes mellitus with hyperglycemia: Secondary | ICD-10-CM

## 2019-04-25 LAB — POCT GLYCOSYLATED HEMOGLOBIN (HGB A1C): Hemoglobin A1C: 6.4 % — AB (ref 4.0–5.6)

## 2019-04-25 NOTE — Progress Notes (Signed)
Patient ID: Barbara Jackson, female   DOB: [birth year], 63 y.o.   MRN: [id number]   HPI: Barbara Jackson is a 63 y.o.-year-old female, returning for follow-up for DM2, dx in 2007, non-insulin-dependent, uncontrolled, without long-term complications.  Last visit 4 months.  She had a R TKR this summer >> feels much better.  However, she also had  recurring UTIs. Barbara Jackson been on ABx. She will see a urologist.  Reviewed HbA1c levels: Lab Results  Component Value Date   HGBA1C 6.2 (A) 12/20/2018   HGBA1C 6.5 (A) 08/17/2018   HGBA1C 6.1 (A) 05/03/2018  02/22/2018: HbA1c 7.0% 07/2017: HbA1c 7.7% 07/10/2016: HbA1c 7.0%  Pt is on a regimen of: - Metformin ER 2000 mg after dinner - Glipizide XL 10 >> 5 mg before breakfast - Ozempic 0.5 >> 1 mg weekly in a.m. We stopped Actos 08/17/2018. She tried Januvia, but stopped last year when she was on a new diet at that time and did not want to make any changes in her medicines.  Pt checks her sugars 1-2 times a day - am:130-169 >> 112-135, 146 >> 131-161 >> 112-143 >> 128-146 - 2h after b'fast: 119, 162 >> 155, 185 >> 135-176 >> 119-159 >> 128, 141 - before lunch: n/c >> 101-128, 156 >> 95-157 >> 97-135 >> 118-128 - 2h after lunch: 158, 202 >> 132-156, 170 >> 218 >> n/c >> 152, 168, 238 - before dinner: 113-132, 155, 165 >> 110-161 >> 115-133, 148 >> 117, 134 - 2h after dinner: 176 >> 105-175 >> 123, 136 >> 120-158 >> 163 - bedtime: n/c >> 116-167 >> 155-169 (dessert) >> 110, 129 >> 99, 146 - nighttime: n/c >> 120, 133 >> n/c Lowest sugar was 119 >> 105 >> 95 >> 97 >> 99; she has hypoglycemia awareness in the 70s. Highest sugar was 202 >> 185 >> 218 >> 159 >> 238 (dessert).  Glucometer: One Touch Ultra 2  In the past, she read Dr. Janene Harvey program for reversing diabetes program started to adopt the changes suggested in the log.  Her sugars started to improve significantly.   She also saw [name] with nutrition in the past.  She is  currently being seen in the binge eating clinic at [facility].    She uses her recumbent bike.  -No CKD, last BUN/creatinine:  Lab Results  Component Value Date   BUN 14 01/12/2019   BUN 15 01/11/2019   CREATININE 0.76 01/12/2019   CREATININE 0.90 01/11/2019  02/22/2018: 17/0.88, GFR 65, glucose 136, with the rest of the CMP normal; ACR 6.4 07/10/2016: 20/0.83 On losartan 100.  -+ HL;  last set of lipids: 02/22/2018: 139/109/71/46 07/10/2016: 164/122/84/32 No results found for: CHOL, HDL, LDLCALC, LDLDIRECT, TRIG, CHOLHDL  On Lipitor 40.  - last eye exam was in 05/2018: No DR  -No numbness and tingling in her feet.  Pt has FH of DM in MGM.  She is also on Cymbalta for depression.  She also has a history of HTN.  ROS: Constitutional: no weight gain/+ weight loss, no fatigue, no subjective hyperthermia, no subjective hypothermia Eyes: no blurry vision, no xerophthalmia ENT: no sore throat, no nodules palpated in neck, no dysphagia, no odynophagia, no hoarseness Cardiovascular: no CP/no SOB/no palpitations/no leg swelling Respiratory: no cough/no SOB/no wheezing Gastrointestinal: no N/no V/no D/no C/no acid reflux Musculoskeletal: no muscle aches/no joint aches Skin: no rashes, no hair loss Neurological: no tremors/no numbness/no tingling/no dizziness  I reviewed pt's medications, allergies, PMH, social hx,  family hx, and changes were documented in the history of present illness. Otherwise, unchanged from my initial visit note.  Past Medical History:  Diagnosis Date  . Abnormal pap 1998   Negative Hpv  . Anxiety   . Arthritis   . Cameron lesion, chronic   . Diabetes mellitus   . Dysrhythmia    hx of atrial tachycardia and pacs  . Esophageal reflux   . H/O hiatal hernia   . H/O iron deficiency anemia 08/2016  . Hypercholesteremia   . Hypertension   . Obesity    compulsive overeater  . Osteoarthritis    Past Surgical History:  Procedure Laterality Date  .  APPENDECTOMY  1975  . TOTAL KNEE ARTHROPLASTY  01/12/2012   Procedure: TOTAL KNEE ARTHROPLASTY;  Surgeon: Gearlean Alf, MD;  Location: [facility];  Service: Orthopedics;  Laterality: Left;  . TOTAL KNEE ARTHROPLASTY Right 01/10/2019   Procedure: TOTAL KNEE ARTHROPLASTY;  Surgeon: Gaynelle Arabian, MD;  Location: [facility];  Service: Orthopedics;  Laterality: Right;  81mn   Social History   Socioeconomic History  . Marital status: Divorced    Spouse name: Not on file  . Number of children: 1  . Years of education: Not on file  . 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 Smoking status: Never Smoker  . Smokeless tobacco: Never Used  Substance and Sexual Activity  . Alcohol use: Yes    Alcohol/week:  Wine, cocktails    Types: 1-2 Standard drinks or equivalent per week  . Drug use: No  . Sexual activity: Never    Partners: Male    Birth control/protection: Post-menopausal  Social History Narrative   She works as a UHydrologistin tBiomedical scientist   Lives alone.     Highest level of education:  One year graduate school   Current Outpatient Medications on File Prior to Visit  Medication Sig Dispense Refill  . albuterol (PROAIR HFA) 108 (90 Base) MCG/ACT inhaler Inhale 2 puffs into the lungs every 6 (six) hours as needed for wheezing or shortness of breath.     .Marland Kitchenatorvastatin (LIPITOR) 20 MG tablet Take 20 mg by mouth daily with supper.   0  . Blood Glucose Monitoring Suppl (ONE TOUCH ULTRA 2) w/Device KIT Use to check blood sugar 2-3 times a day 1 each 0  . ciprofloxacin (CIPRO) 500 MG tablet Take 1 tablet (500 mg total) by mouth 2 (two) times daily. 10 tablet 0  . cyclobenzaprine (FLEXERIL) 10 MG tablet TAKE 1 TABLET 3 TIMES A DAY AS NEEDED FOR MUSCLE SPASM    . diazepam (VALIUM) 5 MG tablet Take 5-10 mg by mouth every 8 (eight) hours as needed for anxiety.    . diphenhydrAMINE (BENADRYL ALLERGY) 25 mg  capsule Take 25 mg by mouth every 6 (six) hours as needed for allergies.     .Marland Kitchenestradiol (ESTRACE) 0.1 MG/GM vaginal cream Apply small amount topically around urethra three times weekly. 42.5 g 1  . fluticasone (FLONASE) 50 MCG/ACT nasal spray Place 2 sprays into both nostrils daily as needed for allergies.     .Marland KitchenFREESTYLE TEST STRIPS test strip   5  . glipiZIDE (GLUCOTROL XL) 5 MG 24 hr tablet Take 1 tablet (5 mg total) by mouth daily. 90 tablet 3  . Iron-FA-B Cmp-C-Biot-Probiotic (FUSION PLUS) CAPS Take 1 capsule by mouth daily.     .Marland Kitchen  losartan-hydrochlorothiazide (HYZAAR) 100-25 MG per tablet Take 1 tablet by mouth every morning.    . metFORMIN (GLUCOPHAGE-XR) 500 MG 24 hr tablet Take 4 tablets (2,000 mg total) by mouth daily with supper. 360 tablet 2  . omeprazole (PRILOSEC) 40 MG capsule Take 40 mg by mouth daily before breakfast.    . oxybutynin (DITROPAN) 5 MG tablet Take 1 tablet (5 mg total) by mouth 3 (three) times daily. (Patient taking differently: Take 5 mg by mouth every 8 (eight) hours as needed for bladder spasms. ) 90 tablet 12  . phenazopyridine (PYRIDIUM) 100 MG tablet Take 1 tablet (100 mg total) by mouth 3 (three) times daily as needed for pain. 15 tablet 0  . potassium chloride SA (K-DUR) 20 MEQ tablet Take 20 mEq by mouth daily.     Marland Kitchen Propylene Glycol (SYSTANE COMPLETE OP) Place 1 drop into both eyes 2 (two) times daily as needed (dry eyes).    . Semaglutide, 1 MG/DOSE, 2 MG/1.5ML SOPN Inject 1 mg into the skin once a week. 6 pen 3  . sertraline (ZOLOFT) 50 MG tablet Take 50 mg by mouth daily with supper.    . zolpidem (AMBIEN) 10 MG tablet Take 10 mg by mouth at bedtime as needed for sleep.      No current facility-administered medications on file prior to visit.    Allergies  Allergen Reactions  . Diclofenac Other (See Comments)    GAS  . Glucotrol [Glipizide] Nausea Only  . Lisinopril Cough  . Adhesive [Tape] Rash   Family History  Problem Relation Age of Onset   . Heart attack Father        Deceased, 8  . COPD Father   . Breast cancer Mother 26       Deceased, 38  . Obesity Sister   . Hypertension Brother   . Healthy Son     PE: BP 110/80   Pulse 84   Ht '5\' 5"'  (1.651 m)   Wt 236 lb (107 kg)   LMP 06/16/2005   SpO2 95%   BMI 39.27 kg/m  Wt Readings from Last 3 Encounters:  04/25/19 236 lb (107 kg)  04/12/19 232 lb 9.6 oz (105.5 kg)  03/31/19 234 lb (106.1 kg)   Constitutional: overweight, in NAD Eyes: PERRLA, EOMI, no exophthalmos ENT: moist mucous membranes, no thyromegaly, no cervical lymphadenopathy Cardiovascular: RRR, No MRG Respiratory: CTA B Gastrointestinal: abdomen soft, NT, ND, BS+ Musculoskeletal: no deformities, strength intact in all 4 Skin: moist, warm, no rashes Neurological: no tremor with outstretched hands, DTR normal in all 4  ASSESSMENT: 1. DM2, non-insulin-dependent, uncontrolled, without long-term complications, but with hyperglycemia  2. HL  3. Obesity  PLAN:  1. Patient with longstanding, uncontrolled, type 2 diabetes, on oral antidiabetic regimen and weekly GLP-1 receptor agonist.  At last visit, sugars were improved after she improved her diet in an effort to lose weight for preparation for knee surgery.  We did not change her regimen.  Since last visit, she had the knee surgery and she is feeling better.  She is starting to walk.  She did not check her sugars until 2 weeks ago and they are approximately the same as last time, but only may be slightly higher in the morning.  She had one hyperglycemic spikes after sweets. -As of now, there is no need to change her regimen -She has recurrent UTIs discussed that her diabetes is controlled enough that it should not feed the UTIs.  I did  suggest a probiotic. - I suggested to:  Patient Instructions  Please continue: - Metformin ER 2000 mg after dinner - Glipizide ER 5 mg before b'fast - Ozempic 1 mg weekly  Start a probiotic.  Please return in 4  months with your sugar log  - we checked her HbA1c: 6.4% (slightly higher) - advised to check sugars at different times of the day - 1x a day, rotating check times - advised for yearly eye exams >> she is UTD - UTD with flu shot - return to clinic in 4 months   2. HL - Reviewed latest lipid panel from 02/2018: All fractions at goal - Continues Lipitor without side effects.  3. Obesity  -She is seen at [facility] for BED -She lost 15+18 pounds before last visit, and 7 more pounds since then -We will continue Ozempic which should also help with weight loss  Philemon Kingdom, MD PhD [facility] Endocrinology

## 2019-04-25 NOTE — Addendum Note (Signed)
Addended by: Cardell Peach I on: 04/25/2019 10:45 AM   Modules accepted: Orders

## 2019-04-25 NOTE — Patient Instructions (Addendum)
Please continue: - Metformin ER 2000 mg after dinner - Glipizide ER 5 mg before b'fast - Ozempic 1 mg weekly  Start a probiotic.  Please return in 4 months with your sugar log

## 2019-04-26 ENCOUNTER — Other Ambulatory Visit: Payer: Self-pay

## 2019-04-26 ENCOUNTER — Ambulatory Visit (INDEPENDENT_AMBULATORY_CARE_PROVIDER_SITE_OTHER): Payer: BC Managed Care – PPO

## 2019-04-26 VITALS — BP 126/68 | HR 95 | Temp 96.8°F | Ht 65.0 in

## 2019-04-26 DIAGNOSIS — Z87440 Personal history of urinary (tract) infections: Secondary | ICD-10-CM

## 2019-04-26 NOTE — Progress Notes (Signed)
Patient here for repeat Urin culture. She states that she is not having any symptoms at this time. She has an appointment with urology for May 18, 2019.  Patient refused to weigh today.   Routing to provider for final review.

## 2019-04-28 LAB — URINE CULTURE

## 2019-04-29 ENCOUNTER — Other Ambulatory Visit: Payer: Self-pay | Admitting: Internal Medicine

## 2019-05-03 ENCOUNTER — Other Ambulatory Visit: Payer: Self-pay

## 2019-05-03 ENCOUNTER — Telehealth: Payer: Self-pay | Admitting: Obstetrics & Gynecology

## 2019-05-03 ENCOUNTER — Ambulatory Visit (INDEPENDENT_AMBULATORY_CARE_PROVIDER_SITE_OTHER): Payer: BC Managed Care – PPO

## 2019-05-03 VITALS — BP 126/82 | HR 72 | Temp 97.2°F | Resp 12 | Ht 65.0 in | Wt 239.0 lb

## 2019-05-03 DIAGNOSIS — Z87440 Personal history of urinary (tract) infections: Secondary | ICD-10-CM | POA: Diagnosis not present

## 2019-05-03 NOTE — Progress Notes (Signed)
Patient in office for a repeat urine micro and urine culture. Complains of having a continuous dull-achy feeling in lower abdomen. Clean-catch urine obtained and sent to the lab to be resulted. Routing to provider and Closing encounter.

## 2019-05-03 NOTE — Telephone Encounter (Signed)
Patient would like to know if she needs to come in for repeat urine culture.

## 2019-05-03 NOTE — Telephone Encounter (Signed)
Reviewed with Dr. Sabra Heck, call returned to patient. Patient scheduled for repeat urine micro and culture today at 3:15pm. Covid19 prescreen negative.   Routing to provider for final review. Patient is agreeable to disposition. Will close encounter.

## 2019-05-03 NOTE — Telephone Encounter (Signed)
Spoke with patient. Patient reports intermittent dull ache in lower abdomen and headache this morning. States symptoms have never completely resolved. Hx of UTIs, Last UC neg on 04/26/19. Denies dysuria, flank pain, blood in urine, fever/chills. Patient has an appt with urology in 2 wks.   Patient asking if she can repeat UC or ok to wait until she sees Urology?   Advised patient I will review with Dr. Sabra Heck and return call.

## 2019-05-04 LAB — URINALYSIS, MICROSCOPIC ONLY
Bacteria, UA: NONE SEEN
Casts: NONE SEEN /lpf
RBC, Urine: NONE SEEN /hpf (ref 0–2)

## 2019-05-05 LAB — URINE CULTURE

## 2019-05-10 ENCOUNTER — Telehealth: Payer: Self-pay | Admitting: Obstetrics & Gynecology

## 2019-05-10 ENCOUNTER — Encounter: Payer: Self-pay | Admitting: Obstetrics & Gynecology

## 2019-05-10 NOTE — Telephone Encounter (Signed)
Visit Follow-Up Question Received: Today Message Contents  Sayyora, Korver" sent to [place]  Phone Number: [phone]        [facility] Dr. Sabra Heck,   Thanks for the information about my most recent (negative) urine culture. As you suggested, I am planning on keeping my appointment with Dr. Matilde Sprang next week (12/2).   A couple of follow-up questions:   1. Will [facility] have access to all of my test results, or is there anything I need to do to make that happen?   2. I'm continuing to have the dull aches in my lower abdomen, off and on, that started during the last couple of months when I had UTIs. Do you think these could be a sign of some kind of infection that doesn't show up in the urine cultures? Or if you have any other theories, I'd welcome them! Of course I'll present these symptoms to Dr. Matilde Sprang at my visit.   Thanks,  [name]

## 2019-05-10 NOTE — Telephone Encounter (Signed)
Call to patient. RN advised calling to follow up on MyChart message sent. RN advised per referral review, records faxed to Dr. Mikle Bosworth office on 04-22-2019. Patient agreeable. Patient also states that since she last saw Dr. Sabra Heck in October, the "dull ache" seems to have let up a bit. States it is not a constant ache. Not using any OTC pain medications. Does use a heating pad with some relief. RN advised Dr. Sabra Heck is out of the office this week, could review with covering provider. Patient declines, stating "I'm not concerned about it." States she just would like to have Dr. Ammie Ferrier input and is okay to wait until she returns to have her review. RN advised would send message to Dr. Sabra Heck. Patient agreeable. Patient is scheduled with Dr. Matilde Sprang on 05-18-2019.   Routing to provider for review.

## 2019-05-11 NOTE — Telephone Encounter (Signed)
I think she should proceed with PUS after seeing Dr. Matilde Sprang if does not do any imaging (like a CT scan).  Thanks.

## 2019-05-16 NOTE — Telephone Encounter (Signed)
Message left to return call to Flannery Cavallero at [phone].    

## 2019-05-16 NOTE — Telephone Encounter (Signed)
Patient returned call. Message given to patient as seen below from Dr. Sabra Heck and she verbalized understanding. Patient will return call to given update after appointment with Dr. Matilde Sprang.   Routing to provider and will close encounter.

## 2019-05-18 ENCOUNTER — Encounter: Payer: Self-pay | Admitting: Obstetrics & Gynecology

## 2019-05-20 ENCOUNTER — Telehealth: Payer: Self-pay | Admitting: *Deleted

## 2019-05-20 ENCOUNTER — Encounter: Payer: Self-pay | Admitting: Obstetrics & Gynecology

## 2019-05-20 NOTE — Telephone Encounter (Signed)
Hi Dr. Sabra Heck,  I saw Dr. Matilde Sprang on Wednesday re: ongoing symptoms (abdominal aches) that seemed to be associated with my recent UTIs. He scoped my bladder, found nothing there that could be causing the pain, and scheduled a CT scan for me. The CT scan is scheduled for Dec 17 and follow-up appointment for Dec 30. These were the earliest dates available.   After the appointment however, I find myself somewhat concerned about living with this pain (even though it is mild) and about what is wrong. I find myself asking: What if there is something seriously wrong with me? Could it be cancer? Do I really have to wait 4 more weeks to know something??   Because I have a long-standing doctor-patient relationship with you, I thought Id ask if you might have any thoughts about my concerns. Ill understand if you want to defer to Dr. Matilde Sprang at this point (and I may try to communicate my concerns/questions to him). But if you do have any advice/thoughts, Id appreciate it!  Thanks,  Barbara Jackson

## 2019-05-20 NOTE — Telephone Encounter (Signed)
Call reviewed with Dr. Sabra Heck, call returned to patient. Directed patient to f/u with urology for earlier CT, if available. Reassured patient  they will likely contact her prior to 12/30 if there is an abnormality on CT that needs immediate attention. Questions answered. Patient verbalizes understanding and is agreeable.   Routing to provider for final review. Patient is agreeable to disposition. Will close encounter.

## 2019-06-24 ENCOUNTER — Other Ambulatory Visit: Payer: Self-pay | Admitting: Internal Medicine

## 2019-08-10 ENCOUNTER — Telehealth: Payer: Self-pay | Admitting: Obstetrics & Gynecology

## 2019-08-10 NOTE — Telephone Encounter (Signed)
Patient is having continual pelvic pain. Has been seeing Dr. Matilde Sprang at [facility] for recurrent UTI pain and she is now finished seeing him and would like to follow up with Dr. Sabra Heck to see what her next steps are.

## 2019-08-10 NOTE — Telephone Encounter (Signed)
Spoke to pt. Pt states having continual pelvic pain that hasn't gone away even after seeing Dr Matilde Sprang. Pt requesting to see Dr Sabra Heck again for follow up and next steps in care. Pt scheduled OV on 08/12/2019 at 8:30 am. Pt agreeable.   Routing to Dr Sabra Heck for review and will close encounter.

## 2019-08-11 ENCOUNTER — Other Ambulatory Visit: Payer: Self-pay

## 2019-08-12 ENCOUNTER — Encounter: Payer: Self-pay | Admitting: Obstetrics & Gynecology

## 2019-08-12 ENCOUNTER — Ambulatory Visit: Payer: BC Managed Care – PPO | Admitting: Obstetrics & Gynecology

## 2019-08-12 VITALS — BP 112/78 | HR 80 | Temp 97.0°F | Resp 10 | Ht 65.0 in | Wt 237.0 lb

## 2019-08-12 DIAGNOSIS — Z87440 Personal history of urinary (tract) infections: Secondary | ICD-10-CM

## 2019-08-12 DIAGNOSIS — R102 Pelvic and perineal pain: Secondary | ICD-10-CM | POA: Diagnosis not present

## 2019-08-12 MED ORDER — URIBEL 118 MG PO CAPS: 1.0000 | ORAL_CAPSULE | Freq: Four times a day (QID) | ORAL | 0 refills | Status: DC

## 2019-08-12 NOTE — Progress Notes (Signed)
GYNECOLOGY  VISIT  CC:   Patient complains of having "dull aches" that are persistent. Per patient, saw Dr. Anders Grant on Tuesday and he stated that the pelvic pain was not urologic.  HPI: 64 y.o. [id number] Single White or Caucasian female here for pelvic pain and recurrent UTIs that occurred in the fall/winter of last year.  She was seen by Dr. Matilde Sprang at [facility].  She did have a CT scan in December that was negative.  Note is in Epic and this was reviewed.  She has been on Trimethoprim 143m since the end of December.  She's not had any other symptoms of cystitis.  She had a final negative urine culture on 08/09/2019.  She has been called by a nurse in his office.    Now, she is still having pain that is lower abdomen.  She has been thinking about this and focusing on it to try and see if there are any patterns.  It is not constant but she does feel the pain about every day.  She mostly notices it in the morning.  She wonders if the pain "goes away" because she focuses on other things through the day.  It is present most days when she wakes up.  It bothers her enough that she uses a heating pad.  She has tried to pay attention to before and after bladder and bowel movements to see if this is a time when pain is better or worse.  She is not sure this has any relation.    She is not taking the oxybutynin at this time.  She is having a little urinary leakage.  Does not have constipation and does have one to two formed bowel movements a day.  Had negative colonoscopy 2018 with Dr. MCollene Mares  Neg pap and neg HR HPV 12/04/2017.    Stopped using estrace vaginal cream in mid December.  GYNECOLOGIC HISTORY: Patient's last menstrual period was 06/16/2005. Contraception: Postmenopausal Menopausal hormone therapy: patient no longer using Estrace  Patient Active Problem List   Diagnosis Date Noted  . Cameron lesion, chronic   . Osteoarthritis of right knee 01/10/2019  . Obesity with serious  comorbidity 09/16/2018  . Hyperlipidemia 08/17/2018  . History of total left knee replacement 08/05/2017  . Open angle with borderline findings and low glaucoma risk in both eyes 04/27/2017  . Obstructive sleep apnea 07/08/2016  . Hypertension   . Type 2 diabetes mellitus with hyperglycemia, without long-term current use of insulin ([place]   . Primary osteoarthritis of right knee 01/12/2012    Past Medical History:  Diagnosis Date  . Abnormal pap 1998   Negative Hpv  . Anxiety   . Arthritis   . Cameron lesion, chronic   . Diabetes mellitus   . Dysrhythmia    hx of atrial tachycardia and pacs  . Esophageal reflux   . H/O hiatal hernia   . H/O iron deficiency anemia 08/2016  . Hypercholesteremia   . Hypertension   . Obesity    compulsive overeater  . Osteoarthritis     Past Surgical History:  Procedure Laterality Date  . APPENDECTOMY  1975  . TOTAL KNEE ARTHROPLASTY  01/12/2012   Procedure: TOTAL KNEE ARTHROPLASTY;  Surgeon: FGearlean Alf MD;  Location: [facility];  Service: Orthopedics;  Laterality: Left;  . TOTAL KNEE ARTHROPLASTY Right 01/10/2019   Procedure: TOTAL KNEE ARTHROPLASTY;  Surgeon: AGaynelle Arabian MD;  Location: [facility];  Service: Orthopedics;  Laterality: Right;  566m  MEDS:   Current Outpatient Medications on File Prior to Visit  Medication Sig Dispense Refill  . albuterol (PROAIR HFA) 108 (90 Base) MCG/ACT inhaler Inhale 2 puffs into the lungs every 6 (six) hours as needed for wheezing or shortness of breath.     Marland Kitchen atorvastatin (LIPITOR) 20 MG tablet Take 20 mg by mouth daily with supper.   0  . Blood Glucose Monitoring Suppl (ONE TOUCH ULTRA 2) w/Device KIT Use to check blood sugar 2-3 times a day 1 each 0  . diazepam (VALIUM) 5 MG tablet Take 5-10 mg by mouth every 8 (eight) hours as needed for anxiety.    . diphenhydrAMINE (BENADRYL ALLERGY) 25 mg capsule Take 25 mg by mouth every 6 (six) hours as needed for allergies.     . fluticasone (FLONASE) 50  MCG/ACT nasal spray Place 2 sprays into both nostrils daily as needed for allergies.     Marland Kitchen FREESTYLE TEST STRIPS test strip   5  . glipiZIDE (GLUCOTROL XL) 5 MG 24 hr tablet TAKE 1 TABLET BY MOUTH EVERY DAY 90 tablet 1  . Iron-FA-B Cmp-C-Biot-Probiotic (FUSION PLUS) CAPS Take 1 capsule by mouth daily.     Marland Kitchen losartan-hydrochlorothiazide (HYZAAR) 100-25 MG per tablet Take 1 tablet by mouth every morning.    . metFORMIN (GLUCOPHAGE-XR) 500 MG 24 hr tablet TAKE 4 TABLETS (2,000 MG TOTAL) BY MOUTH DAILY WITH SUPPER. 360 tablet 2  . omeprazole (PRILOSEC) 40 MG capsule Take 40 mg by mouth daily before breakfast.    . oxybutynin (DITROPAN) 5 MG tablet Take 1 tablet (5 mg total) by mouth 3 (three) times daily. (Patient taking differently: Take 5 mg by mouth every 8 (eight) hours as needed for bladder spasms. ) 90 tablet 12  . potassium chloride SA (K-DUR) 20 MEQ tablet Take 20 mEq by mouth daily.     Marland Kitchen Propylene Glycol (SYSTANE COMPLETE OP) Place 1 drop into both eyes 2 (two) times daily as needed (dry eyes).    . Semaglutide, 1 MG/DOSE, 2 MG/1.5ML SOPN Inject 1 mg into the skin once a week. 6 pen 3  . sertraline (ZOLOFT) 50 MG tablet Take 50 mg by mouth daily with supper.    . trimethoprim (TRIMPEX) 100 MG tablet Take 100 mg by mouth daily.    Marland Kitchen zolpidem (AMBIEN) 10 MG tablet Take 10 mg by mouth at bedtime as needed for sleep.     Marland Kitchen estradiol (ESTRACE) 0.1 MG/GM vaginal cream Apply small amount topically around urethra three times weekly. (Patient not taking: Reported on 08/12/2019) 42.5 g 1  . phenazopyridine (PYRIDIUM) 100 MG tablet Take 1 tablet (100 mg total) by mouth 3 (three) times daily as needed for pain. (Patient not taking: Reported on 05/03/2019) 15 tablet 0   No current facility-administered medications on file prior to visit.    ALLERGIES: Diclofenac, Glucotrol [glipizide], Lisinopril, and Adhesive [tape]  Family History  Problem Relation Age of Onset  . Heart attack Father         Deceased, 103  . COPD Father   . Breast cancer Mother 35       Deceased, 36  . Obesity Sister   . Hypertension Brother   . Healthy Son     SH:  Single, non smoker  Review of Systems  Genitourinary: Positive for pelvic pain.  All other systems reviewed and are negative.   PHYSICAL EXAMINATION:    BP 112/78 (BP Location: Right Arm, Patient Position: Sitting, Cuff Size: Large)  Pulse 80   Temp (!) 97 F (36.1 C) (Temporal)   Resp 10   Ht '5\' 5"'  (1.651 m)   Wt 237 lb (107.5 kg)   LMP 06/16/2005   BMI 39.44 kg/m     General appearance: alert, cooperative and appears stated age Abdomen: soft, non-tender; bowel sounds normal; no masses,  no organomegaly Lymph:  no inguinal LAD noted  Pelvic: External genitalia:  no lesions              Urethra:  normal appearing urethra with no masses, tenderness or lesions              Bartholins and Skenes: normal                 Vagina: normal appearing vagina with normal color and discharge, no lesions              Cervix: no lesions              Bimanual Exam:  Uterus:  normal size, contour, position, consistency, mobility, non-tender              Adnexa: no mass, fullness, tenderness  Chaperone, Terence Lux, CMA, was present for exam.  Assessment: Persistent pelvic/ lower abdominal pain Pelvic floor tenderness  Plan: Actual CT results requested Try uribel capsule am and noon daily for the next few days.  Rx to pharmacy.  Serotonin syndrome risks reviewed.  She will give me an update in a few days. If not better, will refer to PT

## 2019-08-18 ENCOUNTER — Encounter: Payer: Self-pay | Admitting: Obstetrics & Gynecology

## 2019-08-18 ENCOUNTER — Telehealth: Payer: Self-pay | Admitting: *Deleted

## 2019-08-18 DIAGNOSIS — M6289 Other specified disorders of muscle: Secondary | ICD-10-CM

## 2019-08-18 NOTE — Telephone Encounter (Signed)
Dr. Sabra Heck -please advise on Uribel.   Order pended for PT, Barbara Jackson at [facility].

## 2019-08-18 NOTE — Telephone Encounter (Signed)
[name]"  [facility]  Phone Number: [phone]  Hello,   I've taken the Uribel prescription for 5 days now and my pelvic pain symptoms have not gone away. I believe Dr. Sabra Heck said the next step would be to get a referral to PT?   Also, should I now stop taking the Uribel?   You can reach me at [phone].   Thanks,  Barbara Jackson

## 2019-08-18 NOTE — Telephone Encounter (Signed)
[name] has retired so I changed the referral to Earlie Counts at [facility] [place].    She can stop the uribel.  Can you ask her one additional question.  She had a post void residual checked which was 213cc.  This is elevated.  Dr. Mikle Bosworth note said he was going to recheck this.  It was not documented in follow up notes.  Can she remember if he tested this again once she clearly did not have an infection?  Thanks.

## 2019-08-18 NOTE — Telephone Encounter (Signed)
Spoke with patient, advised as seen below per Dr. Sabra Heck. Advised patient she will be contacted by our office referral coordinator with appt details once scheduled.   Patient states she is unsure if they rechecked PVR, she remembers the first one, but is not sure if it was rechecked. Advised I will update Dr. Sabra Heck and f/u with any additional recommendations, patient agreeable.   Routing to Dr. Sabra Heck.

## 2019-08-19 ENCOUNTER — Other Ambulatory Visit: Payer: Self-pay

## 2019-08-23 ENCOUNTER — Encounter: Payer: Self-pay | Admitting: Internal Medicine

## 2019-08-23 ENCOUNTER — Other Ambulatory Visit: Payer: Self-pay

## 2019-08-23 ENCOUNTER — Ambulatory Visit (INDEPENDENT_AMBULATORY_CARE_PROVIDER_SITE_OTHER): Payer: BC Managed Care – PPO | Admitting: Internal Medicine

## 2019-08-23 VITALS — BP 120/70 | HR 96 | Ht 65.0 in | Wt 237.0 lb

## 2019-08-23 DIAGNOSIS — E1165 Type 2 diabetes mellitus with hyperglycemia: Secondary | ICD-10-CM

## 2019-08-23 DIAGNOSIS — E785 Hyperlipidemia, unspecified: Secondary | ICD-10-CM | POA: Diagnosis not present

## 2019-08-23 DIAGNOSIS — E669 Obesity, unspecified: Secondary | ICD-10-CM | POA: Diagnosis not present

## 2019-08-23 LAB — POCT GLYCOSYLATED HEMOGLOBIN (HGB A1C): Hemoglobin A1C: 6.1 % — AB (ref 4.0–5.6)

## 2019-08-23 NOTE — Patient Instructions (Signed)
Please continue: - Metformin ER 2000 mg after dinner - Glipizide ER 5 mg before b'fast - Ozempic 1 mg weekly  Please return in 4 months with your sugar log

## 2019-08-23 NOTE — Addendum Note (Signed)
Addended by: Cardell Peach I on: 08/23/2019 11:27 AM   Modules accepted: Orders

## 2019-08-23 NOTE — Progress Notes (Addendum)
Patient ID: SHENE MAXFIELD, female   DOB: July 13, 1955, 64 y.o.   MRN: [id number]   This visit occurred during the SARS-CoV-2 public health emergency.  Safety protocols were in place, including screening questions prior to the visit, additional usage of staff PPE, and extensive cleaning of exam room while observing appropriate contact time as indicated for disinfecting solutions.   HPI: JOSLIN DOELL is a 64 y.o.-year-old female, returning for follow-up for DM2, dx in 2007, non-insulin-dependent, uncontrolled, without long-term complications.  Last visit 4 months ago.  Reviewed her HbA1c levels: Lab Results  Component Value Date   HGBA1C 6.4 (A) 04/25/2019   HGBA1C 6.2 (A) 12/20/2018   HGBA1C 6.5 (A) 08/17/2018   HGBA1C 6.1 (A) 05/03/2018  02/22/2018: HbA1c 7.0% 07/2017: HbA1c 7.7% 07/10/2016: HbA1c 7.0%  Pt is on a regimen of: - Metformin ER 2000 mg after dinner - Glipizide XL 10 >> 5 mg before breakfast - Ozempic 0.5 >> 1 mg weekly in a.m. We stopped Actos 08/17/2018. She tried Januvia, but stopped last year when she was on a new diet at that time and did not want to make any changes in her medicines.  Pt checks her sugars 0-1 time a day per review of her log: - am: 131-161 >> 112-143 >> 128-146 >> 119-134 - 2h after b'fast: 135-176 >> 119-159 >> 128, 141 >> 94-134, 157 - before lunch: 95-157 >> 97-135 >> 118-128 >> 89-109 - 2h after lunch:  218 >> n/c >> 152, 168, 238 >> 147-189 - before dinner: 110-161 >> 115-133, 148 >> 117, 134 >> 98-136 - 2h after dinner: 123, 136 >> 120-158 >> 163 >> 126, 134, 230 - bedtime:  155-169 (dessert) >> 110, 129 >> 99, 146 >> 121, 133, 180 - nighttime: n/c >> 120, 133 >> n/c Lowest sugar was  99 >> 89; she has hypoglycemia awareness in the 70s. Highest sugar was  238 (dessert) >> 230.  Glucometer: One Touch Ultra 2  In the past, she read Dr. Janene Harvey program for reversing diabetes program started to adopt the changes suggested in the  log.  Her sugars started to improve significantly.   She also saw [name] with nutrition in the past.  She was being seen in the binge eating clinic at [facility].  However, she was not seen in the last 2 years.  She continues to exercise on her recumbent bike.  -No CKD, last BUN/creatinine:  Lab Results  Component Value Date   BUN 14 01/12/2019   BUN 15 01/11/2019   CREATININE 0.76 01/12/2019   CREATININE 0.90 01/11/2019  02/22/2018: 17/0.88, GFR 65, glucose 136, with the rest of the CMP normal; ACR 6.4 07/10/2016: 20/0.83 On losartan 100.  -+ HL;  last set of lipids: 06/12/2019: 170/137/76/71 02/22/2018: 139/109/71/46 07/10/2016: 164/122/84/32 No results found for: CHOL, HDL, LDLCALC, LDLDIRECT, TRIG, CHOLHDL  On Lipitor 40.  - last eye exam was in 03/16/2019: No [facility]). + glaucoma suspect >> goes q 6 mo.  -No numbness and tingling in her feet.  Pt has FH of DM in MGM.  She is also on Cymbalta for depression.  She also has a history of HTN.  She also has a history of frequent UTIs and pelvic floor pain.  She is on trimethoprim chronically for now.  She will start pelvic floor PT.  ROS: Constitutional: no weight gain/no weight loss, no fatigue, no subjective hyperthermia, no subjective hypothermia Eyes: no blurry vision, no xerophthalmia ENT: no sore throat,  no nodules palpated in neck, no dysphagia, no odynophagia, no hoarseness Cardiovascular: no CP/no SOB/no palpitations/no leg swelling Respiratory: no cough/no SOB/no wheezing Gastrointestinal: no N/no V/no D/no C/no acid reflux Musculoskeletal: no muscle aches/no joint aches Skin: no rashes, no hair loss Neurological: no tremors/no numbness/no tingling/no dizziness  I reviewed pt's medications, allergies, PMH, social hx, family hx, and changes were documented in the history of present illness. Otherwise, unchanged from my initial visit note.  Past Medical History:  Diagnosis Date  . Abnormal pap 1998    Negative Hpv  . Anxiety   . Arthritis   . Cameron lesion, chronic   . Diabetes mellitus   . Dysrhythmia    hx of atrial tachycardia and pacs  . Esophageal reflux   . H/O hiatal hernia   . H/O iron deficiency anemia 08/2016  . Hypercholesteremia   . Hypertension   . Obesity    compulsive overeater  . Osteoarthritis    Past Surgical History:  Procedure Laterality Date  . APPENDECTOMY  1975  . TOTAL KNEE ARTHROPLASTY  01/12/2012   Procedure: TOTAL KNEE ARTHROPLASTY;  Surgeon: Gearlean Alf, MD;  Location: [facility];  Service: Orthopedics;  Laterality: Left;  . TOTAL KNEE ARTHROPLASTY Right 01/10/2019   Procedure: TOTAL KNEE ARTHROPLASTY;  Surgeon: Gaynelle Arabian, MD;  Location: [facility];  Service: Orthopedics;  Laterality: Right;  23mn   Social History   Socioeconomic History  . Marital status: Divorced    Spouse name: Not on file  . Number of children: 1  . Years of education: Not on file  . 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 Smoking status: Never Smoker  . Smokeless tobacco: Never Used  Substance and Sexual Activity  . Alcohol use: Yes    Alcohol/week:  Wine, cocktails    Types: 1-2 Standard drinks or equivalent per week  . Drug use: No  . Sexual activity: Never    Partners: Male    Birth control/protection: Post-menopausal  Social History Narrative   She works as a UHydrologistin tBiomedical scientist   Lives alone.     Highest level of education:  One year graduate school   Current Outpatient Medications on File Prior to Visit  Medication Sig Dispense Refill  . albuterol (PROAIR HFA) 108 (90 Base) MCG/ACT inhaler Inhale 2 puffs into the lungs every 6 (six) hours as needed for wheezing or shortness of breath.     .Marland Kitchenatorvastatin (LIPITOR) 20 MG tablet Take 20 mg by mouth daily with supper.   0  . Blood Glucose Monitoring Suppl (ONE TOUCH ULTRA 2) w/Device KIT Use to check blood  sugar 2-3 times a day 1 each 0  . diazepam (VALIUM) 5 MG tablet Take 5-10 mg by mouth every 8 (eight) hours as needed for anxiety.    . diphenhydrAMINE (BENADRYL ALLERGY) 25 mg capsule Take 25 mg by mouth every 6 (six) hours as needed for allergies.     .Marland Kitchenestradiol (ESTRACE) 0.1 MG/GM vaginal cream Apply small amount topically around urethra three times weekly. (Patient not taking: Reported on 08/12/2019) 42.5 g 1  . fluticasone (FLONASE) 50 MCG/ACT nasal spray Place 2 sprays into both nostrils daily as needed for allergies.     .Marland KitchenFREESTYLE TEST STRIPS test strip   5  . glipiZIDE (GLUCOTROL XL) 5 MG 24 hr tablet TAKE 1 TABLET BY MOUTH EVERY DAY 90  tablet 1  . Iron-FA-B Cmp-C-Biot-Probiotic (FUSION PLUS) CAPS Take 1 capsule by mouth daily.     Marland Kitchen losartan-hydrochlorothiazide (HYZAAR) 100-25 MG per tablet Take 1 tablet by mouth every morning.    . metFORMIN (GLUCOPHAGE-XR) 500 MG 24 hr tablet TAKE 4 TABLETS (2,000 MG TOTAL) BY MOUTH DAILY WITH SUPPER. 360 tablet 2  . Meth-Hyo-M Bl-Na Phos-Ph Sal (URIBEL) 118 MG CAPS Take 1 capsule (118 mg total) by mouth in the morning, at noon, in the evening, and at bedtime. 30 capsule 0  . omeprazole (PRILOSEC) 40 MG capsule Take 40 mg by mouth daily before breakfast.    . oxybutynin (DITROPAN) 5 MG tablet Take 1 tablet (5 mg total) by mouth 3 (three) times daily. (Patient taking differently: Take 5 mg by mouth every 8 (eight) hours as needed for bladder spasms. ) 90 tablet 12  . phenazopyridine (PYRIDIUM) 100 MG tablet Take 1 tablet (100 mg total) by mouth 3 (three) times daily as needed for pain. (Patient not taking: Reported on 05/03/2019) 15 tablet 0  . potassium chloride SA (K-DUR) 20 MEQ tablet Take 20 mEq by mouth daily.     Marland Kitchen Propylene Glycol (SYSTANE COMPLETE OP) Place 1 drop into both eyes 2 (two) times daily as needed (dry eyes).    . Semaglutide, 1 MG/DOSE, 2 MG/1.5ML SOPN Inject 1 mg into the skin once a week. 6 pen 3  . sertraline (ZOLOFT) 50 MG tablet  Take 50 mg by mouth daily with supper.    . trimethoprim (TRIMPEX) 100 MG tablet Take 100 mg by mouth daily.    Marland Kitchen zolpidem (AMBIEN) 10 MG tablet Take 10 mg by mouth at bedtime as needed for sleep.      No current facility-administered medications on file prior to visit.   Allergies  Allergen Reactions  . Diclofenac Other (See Comments)    GAS  . Glucotrol [Glipizide] Nausea Only  . Lisinopril Cough  . Adhesive [Tape] Rash   Family History  Problem Relation Age of Onset  . Heart attack Father        Deceased, 60  . COPD Father   . Breast cancer Mother 75       Deceased, 45  . Obesity Sister   . Hypertension Brother   . Healthy Son     PE: BP 120/70   Pulse 96   Ht 5' 5" (1.651 m)   Wt 237 lb (107.5 kg)   LMP 06/16/2005   SpO2 98%   BMI 39.44 kg/m  Wt Readings from Last 3 Encounters:  08/23/19 237 lb (107.5 kg)  08/12/19 237 lb (107.5 kg)  05/03/19 239 lb (108.4 kg)   Constitutional: overweight, in NAD Eyes: PERRLA, EOMI, no exophthalmos ENT: moist mucous membranes, no thyromegaly, no cervical lymphadenopathy Cardiovascular: RRR, No MRG Respiratory: CTA B Gastrointestinal: abdomen soft, NT, ND, BS+ Musculoskeletal: no deformities, strength intact in all 4 Skin: moist, warm, no rashes Neurological: no tremor with outstretched hands, DTR normal in all 4  ASSESSMENT: 1. DM2, non-insulin-dependent, uncontrolled, without long-term complications, but with hyperglycemia  2. HL  3. Obesity class II  PLAN:  1. Patient with longstanding, uncontrolled, type 2 diabetes, on oral antidiabetic regimen and weekly GLP-1 receptor agonist, with improved blood sugars and weight.  She had to lose weight in preparation for knee surgery.  She did have knee surgery before last visit that she was feeling better and started to walk.  Sugars were slightly higher in the morning but we did not  change her diabetic regimen at that time. -At this visit, sugars have improved slightly and they  are almost all at goal, with only few exceptions in the last 3 months.  She had one high blood sugar in the 230s, after dinner, and she remembers having had a large meal but could not remember what she had for the meal.  I do not feel we need to change her regimen for now. - I suggested to:  Patient Instructions  Please continue: - Metformin ER 2000 mg after dinner - Glipizide ER 5 mg before b'fast - Ozempic 1 mg weekly  Please return in 4 months with your sugar log  - we checked her HbA1c: 6.1%  - advised to check sugars at different times of the day - 1x a day, rotating check times - advised for yearly eye exams >> she is UTD - return to clinic in 4 months  2. HL - Reviewed latest lipid panel from 05/2019: Fractions at goal - Continues Lipitor without side effects.  3. Obesity class 2  -She was seen at [facility] for binge eating disorder -however, not in the last 2 years -She lost a significant amount of weight before last visit, now weight stable since last visit -We will continue Ozempic which should also help with weight loss  Philemon Kingdom, MD PhD [facility]

## 2019-08-31 ENCOUNTER — Ambulatory Visit: Payer: BC Managed Care – PPO | Attending: Obstetrics & Gynecology | Admitting: Physical Therapy

## 2019-08-31 ENCOUNTER — Other Ambulatory Visit: Payer: Self-pay

## 2019-08-31 ENCOUNTER — Encounter: Payer: Self-pay | Admitting: Physical Therapy

## 2019-08-31 DIAGNOSIS — R252 Cramp and spasm: Secondary | ICD-10-CM | POA: Diagnosis present

## 2019-08-31 DIAGNOSIS — R278 Other lack of coordination: Secondary | ICD-10-CM | POA: Diagnosis present

## 2019-08-31 DIAGNOSIS — M6281 Muscle weakness (generalized): Secondary | ICD-10-CM

## 2019-08-31 NOTE — Therapy (Signed)
[facility] [address] [place], [place], [address] Phone: [phone]   Fax:  [phone]  Physical Therapy Evaluation  Patient Details  Name: Barbara Jackson MRN: [id number] Date of Birth: 06-Jan-1956 Referring Provider (PT): Dr. Hale Bogus   Encounter Date: 08/31/2019  PT End of Session - 08/31/19 1146    Visit Number  1    Date for PT Re-Evaluation  11/23/19    PT Start Time  1100    PT Stop Time  1135    PT Time Calculation (min)  35 min    Activity Tolerance  Patient tolerated treatment well    Behavior During Therapy  [facility] for tasks assessed/performed       Past Medical History:  Diagnosis Date  . Abnormal pap 1998   Negative Hpv  . Anxiety   . Arthritis   . Cameron lesion, chronic   . Diabetes mellitus   . Dysrhythmia    hx of atrial tachycardia and pacs  . Esophageal reflux   . H/O hiatal hernia   . H/O iron deficiency anemia 08/2016  . Hypercholesteremia   . Hypertension   . Obesity    compulsive overeater  . Osteoarthritis     Past Surgical History:  Procedure Laterality Date  . APPENDECTOMY  1975  . TOTAL KNEE ARTHROPLASTY  01/12/2012   Procedure: TOTAL KNEE ARTHROPLASTY;  Surgeon: Gearlean Alf, MD;  Location: [facility];  Service: Orthopedics;  Laterality: Left;  . TOTAL KNEE ARTHROPLASTY Right 01/10/2019   Procedure: TOTAL KNEE ARTHROPLASTY;  Surgeon: Gaynelle Arabian, MD;  Location: [facility];  Service: Orthopedics;  Laterality: Right;  77min    There were no vitals filed for this visit.   Subjective Assessment - 08/31/19 1104    Subjective  Patient started to have several UTI's 9/20. Afterwards had abdominal and pelvic pain. Patient pain is more frequent and intense. Patient had several tests that were negative. During pelvic exam found her pelvic floor was tender.    Patient Stated Goals  reduce pain    Currently in Pain?  Yes    Pain Score  3     Pain Location  Abdomen    Pain  Orientation  Lower    Pain Descriptors / Indicators  Dull    Pain Type  Acute pain    Pain Onset  More than a month ago    Pain Frequency  Constant    Aggravating Factors   when first wake up    Pain Relieving Factors  as the day progresses    Multiple Pain Sites  No         [facility] PT Assessment - 08/31/19 0001      Assessment   Medical Diagnosis  M62.89 Pelvic floor dysfucntion    Referring Provider (PT)  Dr. Hale Bogus    Onset Date/Surgical Date  02/15/19    Prior Therapy  in the past for urinary leakage      Precautions   Precautions  None      Restrictions   Weight Bearing Restrictions  No      Balance Screen   Has the patient fallen in the past 6 months  No    Has the patient had a decrease in activity level because of a fear of falling?   No    Is the patient reluctant to leave their home because of a fear of falling?   No      Home  Film/video editor residence      Prior Function   Level of Independence  Independent    Vocation  Full time employment    Vocation Requirements  communications and sits    Leisure  read, play with cats, walk, swim, pilates, exercise bike      Cognition   Overall Cognitive Status  Within Functional Limits for tasks assessed      Posture/Postural Control   Posture/Postural Control  No significant limitations      ROM / Strength   AROM / PROM / Strength  AROM;PROM;Strength      AROM   Overall AROM Comments  lumbar ROM is full      Strength   Overall Strength Comments  bil. hip strength 5/5                Objective measurements completed on examination: See above findings.    Pelvic Floor Special Questions - 08/31/19 0001    Currently Sexually Active  No    Urinary Leakage  Yes    Pad use  0    Activities that cause leaking  Coughing;Sneezing;Laughing    Urinary urgency  Yes    Fecal incontinence  No    Skin Integrity  Erthema   distal vulva area   Pelvic Floor Internal Exam  Patient  confirms identification and approves to assess pelvic floor and treatment    Exam Type  Vaginal    Palpation  tenderness at the perineal body; tightness in the levator ani and obturator internist    Strength  weak squeeze, no lift               PT Education - 08/31/19 1148    Education Details  educated patient on how to perform perineal massage and to use her estrogen cream for vaginal health    Person(s) Educated  Patient    Methods  Explanation;Demonstration    Comprehension  Verbalized understanding;Returned demonstration       PT Short Term Goals - 08/31/19 1144      PT SHORT TERM GOAL #1   Title  independent with initial HEP    Time  4    Period  Weeks    Status  New    Target Date  09/28/19      PT SHORT TERM GOAL #2   Title  understand vaginal moisturizers and how they improve vaginal health    Time  4    Period  Weeks    Status  New    Target Date  09/28/19      PT SHORT TERM GOAL #3   Title  lower abdominal pain decreased >/= 25% due to improved tissue mobility    Time  4    Period  Weeks    Status  New    Target Date  09/28/19      PT SHORT TERM GOAL #4   Title  understand how to perform perineal massage to elongate tissue    Time  4    Period  Weeks    Status  New    Target Date  09/28/19        PT Long Term Goals - 08/31/19 1147      PT LONG TERM GOAL #1   Title  independent with advanced HEP    Time  12    Period  Weeks    Status  New    Target Date  11/23/19  PT LONG TERM GOAL #2   Title  understand how to do Pilates with correct mechanics and not straining the pelvic floor    Time  12    Period  Weeks    Status  New    Target Date  11/23/19      PT LONG TERM GOAL #3   Title  lower abdominal pain decreased >/= 80% due to elongation of the pelvic floor muscles and correct breathing techniques    Time  12    Period  Weeks    Status  New    Target Date  11/23/19             Plan - 08/31/19 1138    Clinical  Impression Statement  Patient is a 64 year old female with pelvic floor pain after she had several UTI's in a row last September. Patient reports a constant pain in the lower abdominal area that is worse first thing in the morning and feels better with heat. Patient has tightness in the abdomen and decreased abdominal movement with breathing. Patient pelvic floor strength is 2/5. Patient has tightness in the perineal body, levator ani, and obturator internist. Patient has tenderness located in the posterior fourchette and redness. Patient reports urinary leakage with coughing, laughing and sneezing but does not want this addressed at this time. Patient will benefit from skilled therapy to reduce the pain and improve quality of life.    Personal Factors and Comorbidities  Age;Comorbidity 3+    Comorbidities  hiatal hernia, hypsertension, diabetes    Examination-Activity Limitations  Continence    Stability/Clinical Decision Making  Evolving/Moderate complexity    Clinical Decision Making  Low    Rehab Potential  Excellent    PT Frequency  1x / week    PT Duration  12 weeks    PT Treatment/Interventions  Biofeedback;Cryotherapy;Electrical Stimulation;Ultrasound;Therapeutic activities;Therapeutic exercise;Neuromuscular re-education;Manual techniques;Patient/family education;Dry needling    PT Next Visit Plan  when with Anderson Malta go over pilates, soft tissue work to abdomen and pelvic floor, work on breathing, hip stretches to stretch the pelvic floor, vaginal moisturizers    Consulted and Agree with Plan of Care  Patient       Patient will benefit from skilled therapeutic intervention in order to improve the following deficits and impairments:  Increased fascial restricitons, Pain, Decreased strength  Visit Diagnosis: Muscle weakness (generalized) - Plan: PT plan of care cert/re-cert  Cramp and spasm - Plan: PT plan of care cert/re-cert  Other lack of coordination - Plan: PT plan of care  cert/re-cert     Problem List Patient Active Problem List   Diagnosis Date Noted  . Cameron lesion, chronic   . Osteoarthritis of right knee 01/10/2019  . Obesity with serious comorbidity 09/16/2018  . Hyperlipidemia 08/17/2018  . History of total left knee replacement 08/05/2017  . Open angle with borderline findings and low glaucoma risk in both eyes 04/27/2017  . Obstructive sleep apnea 07/08/2016  . Hypertension   . Type 2 diabetes mellitus with hyperglycemia, without long-term current use of insulin ([place])   . Primary osteoarthritis of right knee 01/12/2012    Earlie Counts, PT 08/31/19 11:51 AM   [facility] [address] [place], [place], [address] Phone: [phone]   Fax:  [phone]  Name: Barbara Jackson MRN: [id number] Date of Birth: 1955/09/26

## 2019-09-09 ENCOUNTER — Other Ambulatory Visit: Payer: Self-pay

## 2019-09-09 ENCOUNTER — Ambulatory Visit: Payer: BC Managed Care – PPO | Admitting: Physical Therapy

## 2019-09-09 ENCOUNTER — Encounter: Payer: Self-pay | Admitting: Physical Therapy

## 2019-09-09 DIAGNOSIS — M6281 Muscle weakness (generalized): Secondary | ICD-10-CM

## 2019-09-09 DIAGNOSIS — R252 Cramp and spasm: Secondary | ICD-10-CM

## 2019-09-09 DIAGNOSIS — R278 Other lack of coordination: Secondary | ICD-10-CM

## 2019-09-09 NOTE — Patient Instructions (Addendum)
Moisturizers . They are used in the vagina to hydrate the mucous membrane that make up the vaginal canal. . Designed to keep a more normal acid balance (ph) . Once placed in the vagina, it will last between two to three days.  . Use 2-3 times per week at bedtime  . Ingredients to avoid is glycerin and fragrance, can increase chance of infection . Should not be used just before sex due to causing irritation . Most are gels administered either in a tampon-shaped applicator or as a vaginal suppository. They are non-hormonal.   Types of Moisturizers- use internally  . Vitamin E vaginal suppositories- Whole foods, Amazon . Moist Again . Coconut oil- can break down condoms . Julva- (Do no use if on Tamoxifen) amazon . Yes moisturizer- amazon . NeuEve Silk , NeuEve Silver for menopausal or over 65 (if have severe vaginal atrophy or cancer treatments use NeuEve Silk for  1 month than move to [employer])- [employer], MapleFlower.dk . Olive and Bee intimate cream- www.oliveandbee.com.au . Mae vaginal [place] . Aloe .    Creams to use externally on the Vulva area  V-magic cream - amazon  Julva-amazon  Vital "V [place] salve ( help moisturize and help with thinning vulvar area, does have [place] by Irwin Brakeman labial moisturizer ([place],   Coconut or olive oil  aloe   Things to avoid in the vaginal area . Do not use things to irritate the vulvar area . No lotions just specialized creams for the vulva area- Neogyn, V-magic, No soaps; can use Aveeno or Calendula cleanser if needed. Must be gentle . No deodorants . No douches . Good to sleep without underwear to let the vaginal area to air out . No scrubbing: spread the lips to let warm water rinse over labias and pat dry  Access Code: [email or web address] URL: [email or web address] Date: 09/09/2019 Prepared by: Earlie Counts  Exercises Seated  Piriformis Stretch with Trunk Bend - 1 x daily - 7 x weekly - 1 sets - 2 reps - 30 sec hold Quadruped Cat Camel - 1 x daily - 7 x weekly - 1 sets - 10 reps Prone Chest Stretch on Chair - 1 x daily - 7 x weekly - 1 sets - 1 reps - 30 sec hold Seated Hip Flexor Stretch - 1 x daily - 7 x weekly - 1 sets - 1 reps - 30 sec hold [facility] [address] [place], [place] [address] Phone # [phone] Fax [phone]

## 2019-09-09 NOTE — Therapy (Signed)
[facility] [address], [place], [place], [address] Phone: [phone]   Fax:  [phone]  Physical Therapy Treatment  Patient Details  Name: Barbara Jackson MRN: [id number] Date of Birth: 1955-07-27 Referring Provider (PT): Dr. Hale Bogus   Encounter Date: 09/09/2019  PT End of Session - 09/09/19 0806    Visit Number  2    Date for PT Re-Evaluation  11/23/19    Authorization Type  [employer]    Activity Tolerance  Patient tolerated treatment well    Behavior During Therapy  [facility] for tasks assessed/performed       Past Medical History:  Diagnosis Date  . Abnormal pap 1998   Negative Hpv  . Anxiety   . Arthritis   . Cameron lesion, chronic   . Diabetes mellitus   . Dysrhythmia    hx of atrial tachycardia and pacs  . Esophageal reflux   . H/O hiatal hernia   . H/O iron deficiency anemia 08/2016  . Hypercholesteremia   . Hypertension   . Obesity    compulsive overeater  . Osteoarthritis     Past Surgical History:  Procedure Laterality Date  . APPENDECTOMY  1975  . TOTAL KNEE ARTHROPLASTY  01/12/2012   Procedure: TOTAL KNEE ARTHROPLASTY;  Surgeon: Gearlean Alf, MD;  Location: [facility];  Service: Orthopedics;  Laterality: Left;  . TOTAL KNEE ARTHROPLASTY Right 01/10/2019   Procedure: TOTAL KNEE ARTHROPLASTY;  Surgeon: Gaynelle Arabian, MD;  Location: [facility];  Service: Orthopedics;  Laterality: Right;  63min    There were no vitals filed for this visit.  Subjective Assessment - 09/09/19 0800    Subjective  My pain has been less. I was not using my heating pad as often. I had some discomfort after the last eval. Pressure on the lower abdomen feels good.    Patient Stated Goals  reduce pain    Currently in Pain?  Yes    Pain Score  1     Pain Location  Abdomen    Pain Orientation  Lower    Pain Descriptors / Indicators  Dull    Pain Type  Acute pain    Pain Onset  More than a month ago    Pain Frequency   Intermittent    Aggravating Factors   when first wake up    Pain Relieving Factors  as the day progressess    Multiple Pain Sites  No                    Pelvic Floor Special Questions - 09/09/19 0001    Pelvic Floor Internal Exam  Patient confirms identification and approves to assess pelvic floor and treatment    Exam Type  Vaginal        [facility] Adult PT Treatment/Exercise - 09/09/19 0001      Self-Care   Self-Care  Other Self-Care Comments    Other Self-Care Comments   educated patient on vaginal moisturizers and vaginal health      Lumbar Exercises: Stretches   Hip Flexor Stretch  Right;Left;1 rep;30 seconds    Hip Flexor Stretch Limitations  sit    Piriformis Stretch  Right;Left;1 rep;30 seconds    Piriformis Stretch Limitations  sitting    Other Lumbar Stretch Exercise  standing childs pose, standing cat cow      Manual Therapy   Manual Therapy  Internal Pelvic Floor    Internal Pelvic Floor  myofascial release to  the vuvlar area and perineal area to elongate the tissue             PT Education - 09/09/19 0825    Education Details  education on vaginal moisturizers;Access Code: [email or web address]    Person(s) Educated  Patient    Methods  Explanation;Demonstration;Verbal cues;Handout    Comprehension  Verbalized understanding;Returned demonstration       PT Short Term Goals - 08/31/19 1144      PT SHORT TERM GOAL #1   Title  independent with initial HEP    Time  4    Period  Weeks    Status  New    Target Date  09/28/19      PT SHORT TERM GOAL #2   Title  understand vaginal moisturizers and how they improve vaginal health    Time  4    Period  Weeks    Status  New    Target Date  09/28/19      PT SHORT TERM GOAL #3   Title  lower abdominal pain decreased >/= 25% due to improved tissue mobility    Time  4    Period  Weeks    Status  New    Target Date  09/28/19      PT SHORT TERM GOAL #4   Title  understand how to perform perineal massage to  elongate tissue    Time  4    Period  Weeks    Status  New    Target Date  09/28/19        PT Long Term Goals - 08/31/19 1147      PT LONG TERM GOAL #1   Title  independent with advanced HEP    Time  12    Period  Weeks    Status  New    Target Date  11/23/19      PT LONG TERM GOAL #2   Title  understand how to do Pilates with correct mechanics and not straining the pelvic floor    Time  12    Period  Weeks    Status  New    Target Date  11/23/19      PT LONG TERM GOAL #3   Title  lower abdominal pain decreased >/= 80% due to elongation of the pelvic floor muscles and correct breathing techniques    Time  12    Period  Weeks    Status  New    Target Date  11/23/19            Plan - 09/09/19 0841    Clinical Impression Statement  Patient reports her pain is decreased to 1/10. Patient understands about moisturizing the vulvar area. Patient is doing her self perineal massage at home. Patient learned some stretches to do to stretch the pelvic floor. When therapist ask patient to bulge the pelvic floor she will contract. Patient will benefit from skilled therapy to reduce the pain and improve quality of life.    Personal Factors and Comorbidities  Age;Comorbidity 3+    Comorbidities  hiatal hernia, hypsertension, diabetes    Examination-Activity Limitations  Continence    Stability/Clinical Decision Making  Evolving/Moderate complexity    Rehab Potential  Excellent    PT Frequency  1x / week    PT Duration  12 weeks    PT Treatment/Interventions  Biofeedback;Cryotherapy;Electrical Stimulation;Ultrasound;Therapeutic activities;Therapeutic exercise;Neuromuscular re-education;Manual techniques;Patient/family education;Dry needling    PT Next Visit Plan  Anderson Malta PTA goes over  pilates; abdominal soft tissue work, bulging of pelvic floor, soft tissue work internally    PT Home Exercise Plan  Access Code: [email or web address]    Recommended Other Services  MD signed initial eval     Consulted and Agree with Plan of Care  Patient       Patient will benefit from skilled therapeutic intervention in order to improve the following deficits and impairments:  Increased fascial restricitons, Pain, Decreased strength  Visit Diagnosis: Muscle weakness (generalized)  Cramp and spasm  Other lack of coordination     Problem List Patient Active Problem List   Diagnosis Date Noted  . Cameron lesion, chronic   . Osteoarthritis of right knee 01/10/2019  . Obesity with serious comorbidity 09/16/2018  . Hyperlipidemia 08/17/2018  . History of total left knee replacement 08/05/2017  . Open angle with borderline findings and low glaucoma risk in both eyes 04/27/2017  . Obstructive sleep apnea 07/08/2016  . Hypertension   . Type 2 diabetes mellitus with hyperglycemia, without long-term current use of insulin ([place])   . Primary osteoarthritis of right knee 01/12/2012    Earlie Counts, PT 09/09/19 8:45 AM   [facility] [address] [place], [place], [address] Phone: [phone]   Fax:  [phone]  Name: Barbara Jackson MRN: [id number] Date of Birth: [birth year]

## 2019-09-20 ENCOUNTER — Encounter: Payer: Self-pay | Admitting: Physical Therapy

## 2019-09-20 ENCOUNTER — Ambulatory Visit: Payer: BC Managed Care – PPO | Attending: Obstetrics & Gynecology | Admitting: Physical Therapy

## 2019-09-20 ENCOUNTER — Other Ambulatory Visit: Payer: Self-pay

## 2019-09-20 DIAGNOSIS — R278 Other lack of coordination: Secondary | ICD-10-CM | POA: Diagnosis present

## 2019-09-20 DIAGNOSIS — R252 Cramp and spasm: Secondary | ICD-10-CM | POA: Diagnosis present

## 2019-09-20 DIAGNOSIS — M6281 Muscle weakness (generalized): Secondary | ICD-10-CM | POA: Diagnosis present

## 2019-09-20 LAB — HM DIABETES EYE EXAM

## 2019-09-20 NOTE — Therapy (Signed)
[facility] [address] [place], [place], [address] Phone: [phone]   Fax:  [phone]  Physical Therapy Treatment  Patient Details  Name: Barbara Jackson MRN: [id number] Date of Birth: [birth year] Referring Provider (PT): Dr. Hale Bogus   Encounter Date: 09/20/2019  PT End of Session - 09/20/19 0849    Visit Number  3    Date for PT Re-Evaluation  11/23/19    Authorization Type  BCBS    PT Start Time  0845    PT Stop Time  0925    PT Time Calculation (min)  40 min    Activity Tolerance  Patient tolerated treatment well    Behavior During Therapy  [facility] for tasks assessed/performed       Past Medical History:  Diagnosis Date  . Abnormal pap 1998   Negative Hpv  . Anxiety   . Arthritis   . Cameron lesion, chronic   . Diabetes mellitus   . Dysrhythmia    hx of atrial tachycardia and pacs  . Esophageal reflux   . H/O hiatal hernia   . H/O iron deficiency anemia 08/2016  . Hypercholesteremia   . Hypertension   . Obesity    compulsive overeater  . Osteoarthritis     Past Surgical History:  Procedure Laterality Date  . APPENDECTOMY  1975  . TOTAL KNEE ARTHROPLASTY  01/12/2012   Procedure: TOTAL KNEE ARTHROPLASTY;  Surgeon: Gearlean Alf, MD;  Location: [facility];  Service: Orthopedics;  Laterality: Left;  . TOTAL KNEE ARTHROPLASTY Right 01/10/2019   Procedure: TOTAL KNEE ARTHROPLASTY;  Surgeon: Gaynelle Arabian, MD;  Location: [facility];  Service: Orthopedics;  Laterality: Right;  17min    There were no vitals filed for this visit.  Subjective Assessment - 09/20/19 0846    Subjective  The symptoms are not continous. I notice the pain in AM and PM.    Patient Stated Goals  reduce pain    Currently in Pain?  Yes    Pain Score  2     Pain Location  Abdomen    Pain Orientation  Lower    Pain Descriptors / Indicators  Dull    Pain Type  Acute pain    Pain Onset  More than a month ago    Pain Frequency   Intermittent    Aggravating Factors   first wake up and end of day    Pain Relieving Factors  as the day progresses    Multiple Pain Sites  No                       [facility] Adult PT Treatment/Exercise - 09/20/19 0001      Neuro Re-ed    Neuro Re-ed Details   breathing into the lower rib cage 360 degrees then filling up her abdomen to work on exursion of the diaphragm; laying on her side with opening of the lower rib cage with breath and therapist expanding the rib cage manually      Manual Therapy   Manual Therapy  Soft tissue mobilization;Myofascial release    Manual therapy comments  educated patient on how to masage her lower abdomen to continue with releasing the lower abdominal fascia    Soft tissue mobilization  right diaphragm, right obliques, and abdominal midline    Myofascial Release  release of the bladder, release of the sac of doualas, release of the mesenteric root, iliocecal valve, and fascial release  of the lower abdomen             PT Education - 09/20/19 0936    Education Details  Access Code: [email or web address]    Person(s) Educated  Patient    Methods  Explanation;Demonstration;Verbal cues;Handout    Comprehension  Returned demonstration;Verbalized understanding       PT Short Term Goals - 09/20/19 0936      PT SHORT TERM GOAL #1   Title  independent with initial HEP    Time  4    Period  Weeks    Status  Achieved    Target Date  09/28/19      PT SHORT TERM GOAL #2   Title  understand vaginal moisturizers and how they improve vaginal health    Time  4    Period  Weeks    Status  Achieved      PT SHORT TERM GOAL #3   Title  lower abdominal pain decreased >/= 25% due to improved tissue mobility    Time  4    Period  Weeks    Status  On-going      PT SHORT TERM GOAL #4   Title  understand how to perform perineal massage to elongate tissue    Time  4    Period  Weeks    Status  On-going        PT Long Term Goals - 08/31/19 1147       PT LONG TERM GOAL #1   Title  independent with advanced HEP    Time  12    Period  Weeks    Status  New    Target Date  11/23/19      PT LONG TERM GOAL #2   Title  understand how to do Pilates with correct mechanics and not straining the pelvic floor    Time  12    Period  Weeks    Status  New    Target Date  11/23/19      PT LONG TERM GOAL #3   Title  lower abdominal pain decreased >/= 80% due to elongation of the pelvic floor muscles and correct breathing techniques    Time  12    Period  Weeks    Status  New    Target Date  11/23/19            Plan - 09/20/19 0849    Clinical Impression Statement  Patient had alot of fascial tightness in the lower abdomen and right upper quadrant. During soft tissue work to the abdomen,patient had increased intestinal sounds due to the improved mobility of the interstines. Patient has difficulty with being aware of the lower abdominal contracting. Patient is able to expand the lower rib cage after therapist did manual work. Patient understands how to do her lower abdominal work at home. Patient continues to leak urine when she does not want to. Patient will benefit from skilled therapy to reduce the pain and improve quality of life.    Personal Factors and Comorbidities  Age;Comorbidity 3+    Comorbidities  hiatal hernia, hypsertension, diabetes    Examination-Activity Limitations  Continence    Stability/Clinical Decision Making  Evolving/Moderate complexity    Rehab Potential  Excellent    PT Frequency  1x / week    PT Duration  12 weeks    PT Treatment/Interventions  Biofeedback;Cryotherapy;Electrical Stimulation;Ultrasound;Therapeutic activities;Therapeutic exercise;Neuromuscular re-education;Manual techniques;Patient/family education;Dry needling    PT Next Visit Plan  [place]  PTA goes over pilates; PT  works on abdominal soft tissue work, bulging of pelvic floor, soft tissue work internally    [place]  Access Code:  [place] and Agree with Plan of Care  Patient       Patient will benefit from skilled therapeutic intervention in order to improve the following deficits and impairments:  Increased fascial restricitons, Pain, Decreased strength  Visit Diagnosis: Muscle weakness (generalized)  Cramp and spasm  Other lack of coordination     Problem List Patient Active Problem List   Diagnosis Date Noted  . Cameron lesion, chronic   . Osteoarthritis of right knee 01/10/2019  . Obesity with serious comorbidity 09/16/2018  . Hyperlipidemia 08/17/2018  . History of total left knee replacement 08/05/2017  . Open angle with borderline findings and low glaucoma risk in both eyes 04/27/2017  . Obstructive sleep apnea 07/08/2016  . Hypertension   . Type 2 diabetes mellitus with hyperglycemia, without long-term current use of insulin ([place])   . Primary osteoarthritis of right knee 01/12/2012    Earlie Counts, PT 09/20/19 9:37 AM    [facility] [address] [place], [place], [address] Phone: [phone]   Fax:  [phone]  Name: Barbara Jackson MRN: [id number] Date of Birth: 06/30/1955

## 2019-09-20 NOTE — Patient Instructions (Signed)
Access Code: [email or web address] URL: [email or web address] Date: 09/20/2019 Prepared by: Earlie Counts  Exercises Seated Piriformis Stretch with Trunk Bend - 1 x daily - 7 x weekly - 1 sets - 2 reps - 30 sec hold Quadruped Cat Camel - 1 x daily - 7 x weekly - 1 sets - 10 reps Prone Chest Stretch on Chair - 1 x daily - 7 x weekly - 1 sets - 1 reps - 30 sec hold Seated Hip Flexor Stretch - 1 x daily - 7 x weekly - 1 sets - 1 reps - 30 sec hold Supine Diaphragmatic Breathing with Pelvic Floor Lengthening - 2 x daily - 7 x weekly - 1 sets - 5 reps Supine Transversus Abdominis Bracing - Hands on Stomach - 2 x daily - 7 x weekly - 1 sets - 10 reps - 5 sec hold [facility] [address] [place], [place] [address] Phone # [phone] Fax [phone]

## 2019-09-24 ENCOUNTER — Other Ambulatory Visit: Payer: Self-pay | Admitting: Internal Medicine

## 2019-09-26 ENCOUNTER — Ambulatory Visit: Payer: BC Managed Care – PPO | Admitting: Physical Therapy

## 2019-09-26 ENCOUNTER — Encounter: Payer: Self-pay | Admitting: Physical Therapy

## 2019-09-26 ENCOUNTER — Other Ambulatory Visit: Payer: Self-pay

## 2019-09-26 DIAGNOSIS — R278 Other lack of coordination: Secondary | ICD-10-CM

## 2019-09-26 DIAGNOSIS — M6281 Muscle weakness (generalized): Secondary | ICD-10-CM | POA: Diagnosis not present

## 2019-09-26 DIAGNOSIS — R252 Cramp and spasm: Secondary | ICD-10-CM

## 2019-09-26 NOTE — Therapy (Signed)
[facility] [address], [place], [place], [address] Phone: [phone]   Fax:  [phone]  Physical Therapy Treatment  Patient Details  Name: Barbara Jackson MRN: [id number] Date of Birth: 04-08-1956 Referring Provider (PT): Dr. Hale Bogus   Encounter Date: 09/26/2019  PT End of Session - 09/26/19 1231    Visit Number  4    Date for PT Re-Evaluation  11/23/19    Authorization Type  BCBS    PT Start Time  1231    PT Stop Time  1315    PT Time Calculation (min)  44 min    Activity Tolerance  Patient tolerated treatment well    Behavior During Therapy  [facility] for tasks assessed/performed       Past Medical History:  Diagnosis Date  . Abnormal pap 1998   Negative Hpv  . Anxiety   . Arthritis   . Cameron lesion, chronic   . Diabetes mellitus   . Dysrhythmia    hx of atrial tachycardia and pacs  . Esophageal reflux   . H/O hiatal hernia   . H/O iron deficiency anemia 08/2016  . Hypercholesteremia   . Hypertension   . Obesity    compulsive overeater  . Osteoarthritis     Past Surgical History:  Procedure Laterality Date  . APPENDECTOMY  1975  . TOTAL KNEE ARTHROPLASTY  01/12/2012   Procedure: TOTAL KNEE ARTHROPLASTY;  Surgeon: Gearlean Alf, MD;  [facility];  Service: Orthopedics;  Laterality: Left;  . TOTAL KNEE ARTHROPLASTY Right 01/10/2019   Procedure: TOTAL KNEE ARTHROPLASTY;  Surgeon: Gaynelle Arabian, MD;  [facility]: [facility];  Service: Orthopedics;  Laterality: Right;  41min    There were no vitals filed for this visit.  Subjective Assessment - 09/26/19 1232    Subjective  Felt good after last session but symptoms are the same.    Currently in Pain?  Yes    Pain Score  2     Pain Location  Abdomen    Pain Orientation  Lower    Pain Descriptors / Indicators  Dull    Aggravating Factors   Varies    Pain Relieving Factors  Not sure                       [facility] Adult PT  Treatment/Exercise - 09/26/19 0001      Lumbar Exercises: Supine   Ab Set  5 reps    Clam  10 reps             PT Education - 09/26/19 1356    Education Details  PTA educated pt in proper Pilates techniques, form that she has questions about including fears of doing something that woul dcause harm.  Pt was able to demonstrate a variety of exercises correctly that also incorporated using her pelvic floor muscles.    Person(s) Educated  Patient    Methods  Explanation;Demonstration;Tactile cues;Verbal cues    Comprehension  Returned demonstration;Verbalized understanding       PT Short Term Goals - 09/20/19 0936      PT SHORT TERM GOAL #1   Title  independent with initial HEP    Time  4    Period  Weeks    Status  Achieved    Target Date  09/28/19      PT SHORT TERM GOAL #2   Title  understand vaginal moisturizers and how they improve vaginal health  Time  4    Period  Weeks    Status  Achieved      PT SHORT TERM GOAL #3   Title  lower abdominal pain decreased >/= 25% due to improved tissue mobility    Time  4    Period  Weeks    Status  On-going      PT SHORT TERM GOAL #4   Title  understand how to perform perineal massage to elongate tissue    Time  4    Period  Weeks    Status  On-going        PT Long Term Goals - 08/31/19 1147      PT LONG TERM GOAL #1   Title  independent with advanced HEP    Time  12    Period  Weeks    Status  New    Target Date  11/23/19      PT LONG TERM GOAL #2   Title  understand how to do Pilates with correct mechanics and not straining the pelvic floor    Time  12    Period  Weeks    Status  New    Target Date  11/23/19      PT LONG TERM GOAL #3   Title  lower abdominal pain decreased >/= 80% due to elongation of the pelvic floor muscles and correct breathing techniques    Time  12    Period  Weeks    Status  New    Target Date  11/23/19            Plan - 09/26/19 1232    Clinical Impression Statement  Pt  remains with complaints of low level lower abdomin pain. She demonstrated a variety of Pilates exercises that she had questions about. Education also included Pilates principles she could incorporate into any of her exercises, she verbally understood these principles. Todays exercises did not increase her pain.    Personal Factors and Comorbidities  Age;Comorbidity 3+    Comorbidities  hiatal hernia, hypsertension, diabetes    Examination-Activity Limitations  Continence    Stability/Clinical Decision Making  Evolving/Moderate complexity    Rehab Potential  Excellent    PT Frequency  1x / week    PT Duration  12 weeks    PT Treatment/Interventions  Biofeedback;Cryotherapy;Electrical Stimulation;Ultrasound;Therapeutic activities;Therapeutic exercise;Neuromuscular re-education;Manual techniques;Patient/family education;Dry needling    PT Next Visit Plan  Return to PT for pelvic floor follow up.    PT Home Exercise Plan  Access Code: [email or web address]    Consulted and Agree with Plan of Care  Patient       Patient will benefit from skilled therapeutic intervention in order to improve the following deficits and impairments:  Increased fascial restricitons, Pain, Decreased strength  Visit Diagnosis: Muscle weakness (generalized)  Cramp and spasm  Other lack of coordination     Problem List Patient Active Problem List   Diagnosis Date Noted  . Cameron lesion, chronic   . Osteoarthritis of right knee 01/10/2019  . Obesity with serious comorbidity 09/16/2018  . Hyperlipidemia 08/17/2018  . History of total left knee replacement 08/05/2017  . Open angle with borderline findings and low glaucoma risk in both eyes 04/27/2017  . Obstructive sleep apnea 07/08/2016  . Hypertension   . Type 2 diabetes mellitus with hyperglycemia, without long-term current use of insulin ([place])   . Primary osteoarthritis of right knee 01/12/2012    Johnie Makki, PTA 09/26/2019, 2:03 PM  [facility] [address], [place], [place], [address] Phone: [phone]   Fax:  [phone]  Name: LARENE ANDREA MRN: [id number] Date of Birth: [birth year]

## 2019-10-03 ENCOUNTER — Encounter: Payer: Self-pay | Admitting: Physical Therapy

## 2019-10-03 ENCOUNTER — Ambulatory Visit: Payer: BC Managed Care – PPO | Admitting: Physical Therapy

## 2019-10-03 ENCOUNTER — Other Ambulatory Visit: Payer: Self-pay

## 2019-10-03 DIAGNOSIS — M6281 Muscle weakness (generalized): Secondary | ICD-10-CM | POA: Diagnosis not present

## 2019-10-03 DIAGNOSIS — R252 Cramp and spasm: Secondary | ICD-10-CM

## 2019-10-03 DIAGNOSIS — R278 Other lack of coordination: Secondary | ICD-10-CM

## 2019-10-03 NOTE — Therapy (Signed)
[facility] [address], [name], [place], [address] Phone: [phone]   Fax:  [phone]  Physical Therapy Treatment  Patient Details  Name: Barbara Jackson MRN: [id number] Date of Birth: 01-Mar-1956 Referring Provider (PT): Dr. Hale Bogus   Encounter Date: 10/03/2019  PT End of Session - 10/03/19 0850    Visit Number  5    Date for PT Re-Evaluation  11/23/19    Authorization Type  BCBS    PT Start Time  0845    PT Stop Time  0925    PT Time Calculation (min)  40 min    Activity Tolerance  Patient tolerated treatment well    Behavior During Therapy  [facility] for tasks assessed/performed       Past Medical History:  Diagnosis Date  . Abnormal pap 1998   Negative Hpv  . Anxiety   . Arthritis   . Cameron lesion, chronic   . Diabetes mellitus   . Dysrhythmia    hx of atrial tachycardia and pacs  . Esophageal reflux   . H/O hiatal hernia   . H/O iron deficiency anemia 08/2016  . Hypercholesteremia   . Hypertension   . Obesity    compulsive overeater  . Osteoarthritis     Past Surgical History:  Procedure Laterality Date  . APPENDECTOMY  1975  . TOTAL KNEE ARTHROPLASTY  01/12/2012   Procedure: TOTAL KNEE ARTHROPLASTY;  Surgeon: Gearlean Alf, MD;  [facility]: [facility];  Service: Orthopedics;  Laterality: Left;  . TOTAL KNEE ARTHROPLASTY Right 01/10/2019   Procedure: TOTAL KNEE ARTHROPLASTY;  Surgeon: Gaynelle Arabian, MD;  [facility];  Service: Orthopedics;  Laterality: Right;  53min    There were no vitals filed for this visit.  Subjective Assessment - 10/03/19 0851    Subjective  I will like to continue with physical therapy. The pain level is the same. I was better after the first visit with internal work.    Patient Stated Goals  reduce pain    Currently in Pain?  Yes    Pain Score  2     Pain Location  Abdomen    Pain Orientation  Lower    Pain Descriptors / Indicators  Dull    Pain Type   Acute pain    Pain Onset  More than a month ago    Pain Frequency  Intermittent    Aggravating Factors   vaties    Pain Relieving Factors  heat    Multiple Pain Sites  No                    Pelvic Floor Special Questions - 10/03/19 0001    Pelvic Floor Internal Exam  Patient confirms identification and approves to assess pelvic floor and treatment    Exam Type  Vaginal    Palpation  tightness in the bulbocavernosus and right ATLA,     Strength  weak squeeze, no lift        [facility] Adult PT Treatment/Exercise - 10/03/19 0001      Neuro Re-ed    Neuro Re-ed Details   pelvic floor contraction that is circular and tapping the lateral muscles, education fully relaxing the pelvic floor muscles prior to the next cotraction      Manual Therapy   Manual Therapy  Internal Pelvic Floor    Manual therapy comments  patient showed PT how she does her internal soft tissue work and she was  performing correctly    Internal Pelvic Floor  right ATLA, bil. urethra sphincter, sphincter muscle, bulbocavernosus, and perineal body to improve the mobility of the muscle and improve the contraction             PT Education - 10/03/19 [place]    Education Details  Access Code: [email or web address]    Person(s) Educated  Patient    Methods  Explanation;Demonstration;Verbal cues;Handout    Comprehension  Verbalized understanding;Returned demonstration       PT Short Term Goals - 10/03/19 0939      PT SHORT TERM GOAL #1   Title  independent with initial HEP    Time  4    Period  Weeks    Status  Achieved      PT SHORT TERM GOAL #2   Title  understand vaginal moisturizers and how they improve vaginal health    Time  4    Period  Weeks    Status  Achieved    Target Date  09/28/19      PT SHORT TERM GOAL #3   Title  lower abdominal pain decreased >/= 25% due to improved tissue mobility    Time  4    Period  Weeks    Status  On-going    Target Date  09/28/19      PT SHORT TERM GOAL #4    Title  understand how to perform perineal massage to elongate tissue    Time  4    Period  Weeks    Status  Achieved    Target Date  09/28/19        PT Long Term Goals - 10/03/19 0854      PT LONG TERM GOAL #1   Title  independent with advanced HEP    Time  12    Period  Weeks    Status  On-going      PT LONG TERM GOAL #2   Title  understand how to do Pilates with correct mechanics and not straining the pelvic floor    Time  12    Period  Weeks    Status  Achieved      PT LONG TERM GOAL #3   Title  lower abdominal pain decreased >/= 80% due to elongation of the pelvic floor muscles and correct breathing techniques    Time  12    Period  Weeks    Status  On-going            Plan - 10/03/19 [id number]    Clinical Impression Statement  Patient did not have tenderness located in the posterior fourchette now. At this time patient does not have a change in her lower abdominal pain. Patient understands how to perform her internal soft tissue work to the pelvic floor muscles. Patient was able to do a circular contraction of the pelvic floor after manual and tactile cues. Patient does need verbal cues to fully relax her pelvic floor prior to her next contraction. Patient is using the creams to the vulvar area and the tissue looks health. Patient will benefit from skilled therapy to improve pelvic floor coordination and reduce her lower abdominal pain.    Personal Factors and Comorbidities  Age;Comorbidity 3+    Comorbidities  hiatal hernia, hypsertension, diabetes    Examination-Activity Limitations  Continence    Stability/Clinical Decision Making  Evolving/Moderate complexity    Rehab Potential  Excellent    PT Frequency  1x / week  PT Duration  12 weeks    PT Treatment/Interventions  Biofeedback;Cryotherapy;Electrical Stimulation;Ultrasound;Therapeutic activities;Therapeutic exercise;Neuromuscular re-education;Manual techniques;Patient/family education;Dry needling    PT Next  Visit Plan  continue to work on pelvic floor circular contraction, Progress the pelvic floor contraction with core strength; release the lower abdomen    PT Home Exercise Plan  Access Code: [email or web address]    Consulted and Agree with Plan of Care  Patient       Patient will benefit from skilled therapeutic intervention in order to improve the following deficits and impairments:  Increased fascial restricitons, Pain, Decreased strength  Visit Diagnosis: Cramp and spasm  Muscle weakness (generalized)  Other lack of coordination     Problem List Patient Active Problem List   Diagnosis Date Noted  . Cameron lesion, chronic   . Osteoarthritis of right knee 01/10/2019  . Obesity with serious comorbidity 09/16/2018  . Hyperlipidemia 08/17/2018  . History of total left knee replacement 08/05/2017  . Open angle with borderline findings and low glaucoma risk in both eyes 04/27/2017  . Obstructive sleep apnea 07/08/2016  . Hypertension   . Type 2 diabetes mellitus with hyperglycemia, without long-term current use of insulin ([place])   . Primary osteoarthritis of right knee 01/12/2012   Earlie Counts, PT 10/03/19 9:40 AM   [facility] [address] [place], [place], [address] Phone: [phone]   Fax:  [phone]  Name: IOLANI SOLAND MRN: [id number] Date of Birth: 10-01-1955

## 2019-10-03 NOTE — Patient Instructions (Signed)
Access Code: [email or web address] URL: [email or web address] Date: 10/03/2019 Prepared by: Earlie Counts  Exercises Seated Piriformis Stretch with Trunk Bend - 1 x daily - 7 x weekly - 1 sets - 2 reps - 30 sec hold Quadruped Cat Camel - 1 x daily - 7 x weekly - 1 sets - 10 reps Prone Chest Stretch on Chair - 1 x daily - 7 x weekly - 1 sets - 1 reps - 30 sec hold Seated Hip Flexor Stretch - 1 x daily - 7 x weekly - 1 sets - 1 reps - 30 sec hold Supine Diaphragmatic Breathing with Pelvic Floor Lengthening - 2 x daily - 7 x weekly - 1 sets - 5 reps Supine Transversus Abdominis Bracing - Hands on Stomach - 2 x daily - 7 x weekly - 1 sets - 10 reps - 5 sec hold Supine Pelvic Floor Contraction - 3 x daily - 7 x weekly - 1 sets - 5 reps - 5 sec hold [facility] [address] [place], [place] [address] Phone # [phone] Fax [phone]

## 2019-10-10 ENCOUNTER — Telehealth: Payer: Self-pay | Admitting: Obstetrics & Gynecology

## 2019-10-10 ENCOUNTER — Encounter: Payer: Self-pay | Admitting: Obstetrics & Gynecology

## 2019-10-10 ENCOUNTER — Encounter: Payer: BC Managed Care – PPO | Admitting: Physical Therapy

## 2019-10-10 NOTE — Telephone Encounter (Signed)
Spoke with patient. Patient has Rx for Estradiol 0.01% vaginal cream, filled 03/2019. Was initially advised to use externally. Patient is asking if ok to start using vaginally and externally?  Or another alternative?   Advised I will review with Dr. Sabra Heck and f/u with recommendations, patient agreeable.  Routing to Dr. Sabra Heck

## 2019-10-10 NOTE — Telephone Encounter (Signed)
Non-Urgent Medical Question Received: Today Message Contents  Barbara, Jackson" sent to [place]  Phone Number: [phone],   I'm currently working with Earlie Counts for pelvic PT. One of the things she suggests is that I apply vaginal moisturizer (internally as well as externally). Do you think it would be okay for me to use the Estradiol that I have, and if so, at what dosage? Or is there something you'd recommend I use instead for moisturizing.   Thanks,  [name]

## 2019-10-10 NOTE — Telephone Encounter (Signed)
It's totally fine for her to use the vaginal cream internally and externally.  She can use 1 ml vaginally twice weekly in additional to the topical use.  Please see if she needs new rx.  Thanks.

## 2019-10-10 NOTE — Telephone Encounter (Signed)
Call to patient, left detailed message, ok per dpr. Advised as seen below per Dr. Sabra Heck, return call to office if new RX needed or if any additional questions.   Encounter closed.

## 2019-10-18 ENCOUNTER — Encounter: Payer: BC Managed Care – PPO | Admitting: Physical Therapy

## 2019-10-19 ENCOUNTER — Ambulatory Visit: Payer: BC Managed Care – PPO | Attending: Obstetrics & Gynecology | Admitting: Physical Therapy

## 2019-10-19 ENCOUNTER — Encounter: Payer: Self-pay | Admitting: Physical Therapy

## 2019-10-19 ENCOUNTER — Other Ambulatory Visit: Payer: Self-pay

## 2019-10-19 DIAGNOSIS — R278 Other lack of coordination: Secondary | ICD-10-CM | POA: Insufficient documentation

## 2019-10-19 DIAGNOSIS — R252 Cramp and spasm: Secondary | ICD-10-CM | POA: Insufficient documentation

## 2019-10-19 DIAGNOSIS — M6281 Muscle weakness (generalized): Secondary | ICD-10-CM | POA: Diagnosis present

## 2019-10-19 NOTE — Patient Instructions (Signed)
Access Code: [email or web address] URL: [email or web address] Date: 10/03/2019 Prepared by: Earlie Counts  Exercises Seated Piriformis Stretch with Trunk Bend - 1 x daily - 7 x weekly - 1 sets - 2 reps - 30 sec hold Quadruped Cat Camel - 1 x daily - 7 x weekly - 1 sets - 10 reps Seated Hip Flexor Stretch - 1 x daily - 7 x weekly - 1 sets - 1 reps - 30 sec hold Supine Diaphragmatic Breathing with Pelvic Floor Lengthening - 2 x daily - 7 x weekly - 1 sets - 5 reps Supine Transversus Abdominis Bracing - Hands on Stomach - 2 x daily - 7 x weekly - 1 sets - 10 reps - 5 sec hold Supine Pelvic Floor Contraction - 3 x daily - 7 x weekly - 1 sets - 5 reps - 10 sec hold Seated Pelvic Floor Contraction - 2 x daily - 7 x weekly - 1 sets - 5 reps - 5 sec hold [facility] [address] [place], [place] [address] Phone # [phone] Fax [phone]

## 2019-10-19 NOTE — Therapy (Signed)
[facility] [address] [place], [place], [address] Phone: [phone]   Fax:  [phone]  Physical Therapy Treatment  Patient Details  Name: Barbara Jackson MRN: [id number] Date of Birth: [birth year] Referring Provider (PT): Dr. Hale Bogus   Encounter Date: 10/19/2019  PT End of Session - 10/19/19 0858    Visit Number  6    Date for PT Re-Evaluation  11/23/19    Authorization Type  BCBS    PT Start Time  0845    PT Stop Time  0925    PT Time Calculation (min)  40 min    Activity Tolerance  Patient tolerated treatment well    Behavior During Therapy  [facility] for tasks assessed/performed       Past Medical History:  Diagnosis Date  . Abnormal pap 1998   Negative Hpv  . Anxiety   . Arthritis   . Cameron lesion, chronic   . Diabetes mellitus   . Dysrhythmia    hx of atrial tachycardia and pacs  . Esophageal reflux   . H/O hiatal hernia   . H/O iron deficiency anemia 08/2016  . Hypercholesteremia   . Hypertension   . Obesity    compulsive overeater  . Osteoarthritis     Past Surgical History:  Procedure Laterality Date  . APPENDECTOMY  1975  . TOTAL KNEE ARTHROPLASTY  01/12/2012   Procedure: TOTAL KNEE ARTHROPLASTY;  Surgeon: Gearlean Alf, MD;  Location: [facility];  Service: Orthopedics;  Laterality: Left;  . TOTAL KNEE ARTHROPLASTY Right 01/10/2019   Procedure: TOTAL KNEE ARTHROPLASTY;  Surgeon: Gaynelle Arabian, MD;  Location: [facility];  Service: Orthopedics;  Laterality: Right;  29min    There were no vitals filed for this visit.  Subjective Assessment - 10/19/19 0849    Subjective  I have a few questions. Pain frequency and intensity is less.    Patient Stated Goals  reduce pain    Pain Score  2     Pain Location  Abdomen    Pain Orientation  Lower    Pain Descriptors / Indicators  Dull    Pain Type  Acute pain    Pain Onset  More than a month ago    Pain Frequency  Intermittent    Aggravating  Factors   varies    Pain Relieving Factors  heat    Multiple Pain Sites  No                    Pelvic Floor Special Questions - 10/19/19 0001    Pelvic Floor Internal Exam  Patient confirms identification and approves to assess pelvic floor and treatment    Exam Type  Vaginal    Strength  fair squeeze, definite lift    Strength # of seconds  10   VC to breath       [facility] Adult PT Treatment/Exercise - 10/19/19 0001      Self-Care   Self-Care  Other Self-Care Comments    Other Self-Care Comments   discussed with patient on how the diaphram works with the pelvic floor and how it moves with breath;explained to patient on how the obturator internist can affect pain in the lower abdomen      Neuro Re-ed    Neuro Re-ed Details   pelvic floor contraction with tactile cues and to breath in hookly; pelvic floor contraction in sitting holding for 5 se       Lumbar  Exercises: Stretches   Hip Flexor Stretch  Right;Left;1 rep;30 seconds      Manual Therapy   Manual Therapy  Internal Pelvic Floor    Internal Pelvic Floor  bil. introitus, bil. urethra sphincter, left obturator internist and levator ani             PT Education - 10/19/19 0923    Education Details  Access Code: [email or web address]    Methods  Explanation;Demonstration;Verbal cues;Handout    Comprehension  Verbalized understanding;Returned demonstration       PT Short Term Goals - 10/19/19 0857      PT SHORT TERM GOAL #3   Title  lower abdominal pain decreased >/= 25% due to improved tissue mobility    Time  4    Period  Weeks    Status  Achieved        PT Long Term Goals - 10/03/19 [id number]      PT LONG TERM GOAL #1   Title  independent with advanced HEP    Time  12    Period  Weeks    Status  On-going      PT LONG TERM GOAL #2   Title  understand how to do Pilates with correct mechanics and not straining the pelvic floor    Time  12    Period  Weeks    Status  Achieved      PT LONG TERM GOAL #3    Title  lower abdominal pain decreased >/= 80% due to elongation of the pelvic floor muscles and correct breathing techniques    Time  12    Period  Weeks    Status  On-going            Plan - 10/19/19 [id number]    Clinical Impression Statement  Pelvic floor strength increased to 3/5 with circular contraction and lift. Tightness in the introitus. Tenderness located in left obturator internist and levator ani. Patient understands how the diaphgram and pelvic floor work. Patient reports her pain is 25% better. Patient reports some urinary leakage by the end of the day. Patient is now able to hold her pelvic floor contraction in supine for 10 seconds instead of 5 seconds. Patient will benefit from skilled therapy to improve pelvic floor coordination and reduce her lower abdominal pain.    Personal Factors and Comorbidities  Age;Comorbidity 3+    Comorbidities  hiatal hernia, hypsertension, diabetes    Examination-Activity Limitations  Continence    Stability/Clinical Decision Making  Evolving/Moderate complexity    Rehab Potential  Excellent    PT Frequency  1x / week    PT Duration  12 weeks    PT Treatment/Interventions  Biofeedback;Cryotherapy;Electrical Stimulation;Ultrasound;Therapeutic activities;Therapeutic exercise;Neuromuscular re-education;Manual techniques;Patient/family education;Dry needling    PT Next Visit Plan  Progress the pelvic floor contraction with core strength; release the lower abdomen    PT Home Exercise Plan  Access Code: [email or web address]    Consulted and Agree with Plan of Care  Patient       Patient will benefit from skilled therapeutic intervention in order to improve the following deficits and impairments:  Increased fascial restricitons, Pain, Decreased strength  Visit Diagnosis: Cramp and spasm  Muscle weakness (generalized)  Other lack of coordination     Problem List Patient Active Problem List   Diagnosis Date Noted  . Cameron lesion, chronic   .  Osteoarthritis of right knee 01/10/2019  . Obesity with serious comorbidity 09/16/2018  . Hyperlipidemia 08/17/2018  . History  of total left knee replacement 08/05/2017  . Open angle with borderline findings and low glaucoma risk in both eyes 04/27/2017  . Obstructive sleep apnea 07/08/2016  . Hypertension   . Type 2 diabetes mellitus with hyperglycemia, without long-term current use of insulin ([place])   . Primary osteoarthritis of right knee 01/12/2012    Earlie Counts, PT 10/19/19 9:27 AM   [facility] [address] [place], [place], [address] Phone: [phone]   Fax:  [phone]  Name: Barbara Jackson MRN: [id number] Date of Birth: 05-06-1956

## 2019-10-31 ENCOUNTER — Encounter: Payer: Self-pay | Admitting: Physical Therapy

## 2019-10-31 ENCOUNTER — Ambulatory Visit: Payer: BC Managed Care – PPO | Admitting: Physical Therapy

## 2019-10-31 ENCOUNTER — Other Ambulatory Visit: Payer: Self-pay

## 2019-10-31 DIAGNOSIS — M6281 Muscle weakness (generalized): Secondary | ICD-10-CM

## 2019-10-31 DIAGNOSIS — R278 Other lack of coordination: Secondary | ICD-10-CM

## 2019-10-31 DIAGNOSIS — R252 Cramp and spasm: Secondary | ICD-10-CM | POA: Diagnosis not present

## 2019-10-31 NOTE — Therapy (Signed)
[facility] [address] [place], [place], [address] Phone: [phone]   Fax:  [phone]  Physical Therapy Treatment  Patient Details  Name: Barbara Jackson MRN: [id number] Date of Birth: [birth year] Referring Provider (PT): Dr. Hale Bogus   Encounter Date: 10/31/2019  PT End of Session - 10/31/19 0926    Visit Number  7    Date for PT Re-Evaluation  11/23/19    Authorization Type  BCBS    PT Start Time  0845    PT Stop Time  0923    PT Time Calculation (min)  38 min    Activity Tolerance  Patient tolerated treatment well    Behavior During Therapy  [facility] for tasks assessed/performed       Past Medical History:  Diagnosis Date  . Abnormal pap 1998   Negative Hpv  . Anxiety   . Arthritis   . Cameron lesion, chronic   . Diabetes mellitus   . Dysrhythmia    hx of atrial tachycardia and pacs  . Esophageal reflux   . H/O hiatal hernia   . H/O iron deficiency anemia 08/2016  . Hypercholesteremia   . Hypertension   . Obesity    compulsive overeater  . Osteoarthritis     Past Surgical History:  Procedure Laterality Date  . APPENDECTOMY  1975  . TOTAL KNEE ARTHROPLASTY  01/12/2012   Procedure: TOTAL KNEE ARTHROPLASTY;  Surgeon: Gearlean Alf, MD;  Location: [facility];  Service: Orthopedics;  Laterality: Left;  . TOTAL KNEE ARTHROPLASTY Right 01/10/2019   Procedure: TOTAL KNEE ARTHROPLASTY;  Surgeon: Gaynelle Arabian, MD;  Location: [facility];  Service: Orthopedics;  Laterality: Right;  104min    There were no vitals filed for this visit.  Subjective Assessment - 10/31/19 0848    Subjective  I have been doing my exercises. Sometimes I have better days and some not. Pain in the morning.    Patient Stated Goals  reduce pain    Currently in Pain?  Yes    Pain Score  2     Pain Location  Abdomen    Pain Orientation  Lower;Right;Left    Pain Descriptors / Indicators  Dull    Pain Type  Acute pain    Pain Onset   More than a month ago    Pain Frequency  Intermittent    Aggravating Factors   varies    Pain Relieving Factors  heat    Multiple Pain Sites  No         [facility] PT Assessment - 10/31/19 0001      Ambulation/Gait   Ambulation/Gait  Yes    Gait Pattern  Within Functional Limits    Gait Comments  going up and down steps no deficits or pain in lower abdomen                    [facility] Adult PT Treatment/Exercise - 10/31/19 0001      Self-Care   Self-Care  Other Self-Care Comments    Other Self-Care Comments   explained to the patient why the stretches witll elongate the pelvic floor and how it does      Lumbar Exercises: Stretches   Active Hamstring Stretch  Left;1 rep;60 seconds;Right;5 reps    Active Hamstring Stretch Limitations  used contract relax to reduce the pain in the left ischial tuberosity    Hip Flexor Stretch  Right;Left;2 reps;30 seconds    Hip Flexor  Stretch Limitations  supine with leg off the mat, then on the step with one leg ahead on the top step      Lumbar Exercises: Seated   Other Seated Lumbar Exercises  seated pelvic floor contraction for 5 sec and 10 seconds  with counting out loud to not focus on breath      Lumbar Exercises: Supine   Bent Knee Raise  20 reps;1 second    Bent Knee Raise Limitations  while contracting the pelvic floor and abdominals and pelvic floor    Other Supine Lumbar Exercises  hookly between knees, tightne abdominals and pelvic floor while punching forward with red band      Lumbar Exercises: Sidelying   Clam  Right;Left;10 reps;1 second    Clam Limitations  reverse clam; use yellow band around ankles             PT Education - 10/31/19 0925    Education Details  Access Code: [email or web address]    Person(s) Educated  Patient    Methods  Explanation;Demonstration;Verbal cues;Handout    Comprehension  Verbalized understanding;Returned demonstration       PT Short Term Goals - 10/19/19 0857      PT SHORT TERM GOAL #3    Title  lower abdominal pain decreased >/= 25% due to improved tissue mobility    Time  4    Period  Weeks    Status  Achieved        PT Long Term Goals - 10/31/19 0850      PT LONG TERM GOAL #1   Title  independent with advanced HEP    Time  12    Period  Weeks    Status  On-going      PT LONG TERM GOAL #2   Title  understand how to do Pilates with correct mechanics and not straining the pelvic floor    Time  12    Period  Weeks    Status  Achieved      PT LONG TERM GOAL #3   Title  lower abdominal pain decreased >/= 80% due to elongation of the pelvic floor muscles and correct breathing techniques    Baseline  35% better, less intense and frequent    Time  12    Period  Weeks    Status  On-going            Plan - 10/31/19 0908    Clinical Impression Statement  Patient reports 35% less pain that is less frequet and less intense. Patient reports no change after a bowel movement. Patient has no gait deficits or pain with steps. Patient understands how stretching her hips wills tretch the pelvic floor. Patient has increased shaking with abdominal exercises indicating weakness. Patient will benefit from skilled therapy to improve pelvic floor coordination and reduce her lower abdominal pain.    Personal Factors and Comorbidities  Age;Comorbidity 3+    Comorbidities  hiatal hernia, hypsertension, diabetes    Examination-Activity Limitations  Continence    Stability/Clinical Decision Making  Evolving/Moderate complexity    Rehab Potential  Excellent    PT Frequency  1x / week    PT Duration  12 weeks    PT Treatment/Interventions  Biofeedback;Cryotherapy;Electrical Stimulation;Ultrasound;Therapeutic activities;Therapeutic exercise;Neuromuscular re-education;Manual techniques;Patient/family education;Dry needling    PT Next Visit Plan  see if patient will continue or discharge, progress her exericses    PT Home Exercise Plan  Access Code: [email or web address]    Consulted and Agree  with Plan of Care  Patient       Patient will benefit from skilled therapeutic intervention in order to improve the following deficits and impairments:  Increased fascial restricitons, Pain, Decreased strength  Visit Diagnosis: Cramp and spasm  Muscle weakness (generalized)  Other lack of coordination     Problem List Patient Active Problem List   Diagnosis Date Noted  . Cameron lesion, chronic   . Osteoarthritis of right knee 01/10/2019  . Obesity with serious comorbidity 09/16/2018  . Hyperlipidemia 08/17/2018  . History of total left knee replacement 08/05/2017  . Open angle with borderline findings and low glaucoma risk in both eyes 04/27/2017  . Obstructive sleep apnea 07/08/2016  . Hypertension   . Type 2 diabetes mellitus with hyperglycemia, without long-term current use of insulin ([place])   . Primary osteoarthritis of right knee 01/12/2012    Earlie Counts, PT 10/31/19 9:32 AM   [facility] [address] [place], [place], [address] Phone: [phone]   Fax:  [phone]  Name: TAZARIA BILLOCK MRN: [id number] Date of Birth: June 01, 1956

## 2019-10-31 NOTE — Patient Instructions (Signed)
Access Code: [email or web address] URL: [email or web address] Date: 10/03/2019 Prepared by: Earlie Counts  Exercises Seated Piriformis Stretch with Trunk Bend - 1 x daily - 7 x weekly - 1 sets - 2 reps - 30 sec hold Quadruped Cat Camel - 1 x daily - 7 x weekly - 1 sets - 10 reps Seated Hip Flexor Stretch - 1 x daily - 7 x weekly - 1 sets - 1 reps - 30 sec hold Supine Diaphragmatic Breathing with Pelvic Floor Lengthening - 2 x daily - 7 x weekly - 1 sets - 5 reps Supine Transversus Abdominis Bracing - Hands on Stomach - 2 x daily - 7 x weekly - 1 sets - 10 reps - 5 sec hold Supine Pelvic Floor Contraction - 3 x daily - 7 x weekly - 1 sets - 5 reps - 15 sec hold Seated Pelvic Floor Contraction - 2 x daily - 7 x weekly - 1 sets - 5 reps - 10 sec hold Supine March - 1 x daily - 7 x weekly - 2 sets - 10 reps Sidelying Reverse Clamshell - 1 x daily - 7 x weekly - 1 sets - 15 reps Supine Serratus Punches Resistance - 1 x daily - 7 x weekly - 2 sets - 10 reps [facility] [address], [place], [place] [address] Phone # [phone] Fax [phone]

## 2019-11-04 ENCOUNTER — Other Ambulatory Visit: Payer: Self-pay | Admitting: Internal Medicine

## 2019-11-23 ENCOUNTER — Other Ambulatory Visit: Payer: Self-pay

## 2019-11-23 ENCOUNTER — Encounter: Payer: Self-pay | Admitting: Physical Therapy

## 2019-11-23 ENCOUNTER — Ambulatory Visit: Payer: BC Managed Care – PPO | Attending: Obstetrics & Gynecology | Admitting: Physical Therapy

## 2019-11-23 DIAGNOSIS — M6281 Muscle weakness (generalized): Secondary | ICD-10-CM

## 2019-11-23 DIAGNOSIS — R278 Other lack of coordination: Secondary | ICD-10-CM | POA: Diagnosis present

## 2019-11-23 DIAGNOSIS — M79642 Pain in left hand: Secondary | ICD-10-CM | POA: Insufficient documentation

## 2019-11-23 DIAGNOSIS — R252 Cramp and spasm: Secondary | ICD-10-CM | POA: Insufficient documentation

## 2019-11-23 DIAGNOSIS — M79641 Pain in right hand: Secondary | ICD-10-CM | POA: Insufficient documentation

## 2019-11-23 NOTE — Patient Instructions (Signed)
Access Code: [email or web address] URL: [email or web address] Date: 11/23/2019 Prepared by: Earlie Counts  Exercises Seated Piriformis Stretch with Trunk Bend - 1 x daily - 2 x weekly - 1 sets - 2 reps - 30 sec hold Quadruped Cat Camel - 1 x daily - 2 x weekly - 1 sets - 10 reps Seated Hip Flexor Stretch - 1 x daily - 2 x weekly - 1 sets - 1 reps - 30 sec hold Supine Diaphragmatic Breathing with Pelvic Floor Lengthening - 2 x daily - 2 x weekly - 1 sets - 5 reps Supine Pelvic Floor Contraction - 1 x daily - 7 x weekly - 1 sets - 5 reps - 15 sec hold Seated Pelvic Floor Contraction - 1 x daily - 7 x weekly - 1 sets - 5 reps - 10 sec hold Supine March - 1 x daily - 2 x weekly - 2 sets - 10 reps Supine Serratus Punches Resistance - 1 x daily - 2 x weekly - 2 sets - 10 reps [facility] [address], [place], [place] [address] Phone # [phone] Fax [phone]

## 2019-11-23 NOTE — Therapy (Signed)
[facility] [address], [place], [place], [address] Phone: [phone]   Fax:  [phone]  Physical Therapy Treatment  Patient Details  Name: Barbara Jackson MRN: [id number] Date of Birth: 1955-12-13 Referring Provider (PT): Dr. Hale Bogus   Encounter Date: 11/23/2019  PT End of Session - 11/23/19 0853    Visit Number  8    Date for PT Re-Evaluation  11/23/19    Authorization Type  BC[employer]    PT Start Time  0846    PT Stop Time  0918    PT Time Calculation (min)  32 min    Activity Tolerance  Patient tolerated treatment well    Behavior During Therapy  [facility] for tasks assessed/performed       Past Medical History:  Diagnosis Date  . Abnormal pap 1998   Negative Hpv  . Anxiety   . Arthritis   . Cameron lesion, chronic   . Diabetes mellitus   . Dysrhythmia    hx of atrial tachycardia and pacs  . Esophageal reflux   . H/O hiatal hernia   . H/O iron deficiency anemia 08/2016  . Hypercholesteremia   . Hypertension   . Obesity    compulsive overeater  . Osteoarthritis     Past Surgical History:  Procedure Laterality Date  . APPENDECTOMY  1975  . TOTAL KNEE ARTHROPLASTY  01/12/2012   Procedure: TOTAL KNEE ARTHROPLASTY;  Surgeon: Gearlean Alf, MD;  Location: [facility];  Service: Orthopedics;  Laterality: Left;  . TOTAL KNEE ARTHROPLASTY Right 01/10/2019   Procedure: TOTAL KNEE ARTHROPLASTY;  Surgeon: Gaynelle Arabian, MD;  Location: [facility];  Service: Orthopedics;  Laterality: Right;  41mn    There were no vitals filed for this visit.  Subjective Assessment - 11/23/19 0854    Subjective  I have been doing okay. I have some pain. that i s75% better.When I am busy I do not notice it.    Patient Stated Goals  reduce pain    Currently in Pain?  Yes    Pain Score  1     Pain Location  Abdomen    Pain Orientation  Right;Left;Lower    Pain Descriptors / Indicators  Dull    Pain Type  Acute pain    Pain Onset   More than a month ago    Pain Frequency  Intermittent    Aggravating Factors   varies    Pain Relieving Factors  heat    Multiple Pain Sites  No         [facility] PT Assessment - 11/23/19 0001      Assessment   Medical Diagnosis  M62.89 Pelvic floor dysfucntion    Referring Provider (PT)  Dr. MHale Bogus   Onset Date/Surgical Date  02/15/19    Prior Therapy  in the past for urinary leakage      Precautions   Precautions  None      Restrictions   Weight Bearing Restrictions  No      Home Environment   Living Environment  Private residence      Prior Function   Level of Independence  Independent      Cognition   Overall Cognitive Status  Within Functional Limits for tasks assessed      Posture/Postural Control   Posture/Postural Control  No significant limitations      ROM / Strength   AROM / PROM / Strength  AROM;PROM;Strength  Strength   Overall Strength Comments  bil. hip strength 5/5                 Pelvic Floor Special Questions - 11/23/19 0001    Urinary Leakage  Yes    Pad use  0    Activities that cause leaking  Coughing;Sneezing;Laughing    Falling out feeling (prolapse)  No    External Perineal Exam  able to bulge the pelvic floor    External Palpation  no tenderness    Prolapse  Anterior Wall   grade 1   Pelvic Floor Internal Exam  Patient confirms identification and approves to assess pelvic floor and treatment    Exam Type  Vaginal    Palpation  no tenderness or tightness    Strength  fair squeeze, definite lift    Strength # of seconds  10        [facility] Adult PT Treatment/Exercise - 11/23/19 0001      Self-Care   Self-Care  Other Self-Care Comments    Other Self-Care Comments   went over vaginal moisturizers and how they improve tissue health. discussed with patient on how masturbation will improve blood flow and tissue health in the vaginal area      Exercises   Exercises  Other Exercises    Other Exercises   verbally reviewed  HEP on Med bridge and patient understands and is independent with her HEP      Lumbar Exercises: Aerobic   Elliptical  level 1 5 min while assessing patient             PT Education - 11/23/19 0922    Education Details  Access Code: [email or web address]    Person(s) Educated  Patient    Methods  Explanation;Handout    Comprehension  Verbalized understanding       PT Short Term Goals - 11/23/19 0912      PT SHORT TERM GOAL #1   Time  4    Period  Weeks    Status  Achieved      PT SHORT TERM GOAL #2   Title  understand vaginal moisturizers and how they improve vaginal health    Period  Weeks    Status  Achieved      PT SHORT TERM GOAL #3   Title  lower abdominal pain decreased >/= 25% due to improved tissue mobility    Time  4    Period  Weeks    Status  Achieved      PT SHORT TERM GOAL #4   Title  understand how to perform perineal massage to elongate tissue    Time  4    Period  Weeks    Status  Achieved        PT Long Term Goals - 11/23/19 0912      PT LONG TERM GOAL #1   Title  independent with advanced HEP    Time  12    Period  Weeks    Status  Achieved      PT LONG TERM GOAL #2   Title  understand how to do Pilates with correct mechanics and not straining the pelvic floor    Time  12    Period  Weeks    Status  Achieved      PT LONG TERM GOAL #3   Title  lower abdominal pain decreased >/= 80% due to elongation of the pelvic floor muscles and correct breathing techniques  Time  12    Period  Weeks    Status  Achieved            Plan - 11/23/19 0902    Clinical Impression Statement  Patient reports her pain is 80% better and level 1 when she has it. Patient pelvic floor strength is 3/5 holding for 10 seconds. No palpable tenderness located in the pelvic floor or tightness. Patiient understands good vaginal health and how to use moisturizers. Patient is independent with her HEP. Patient is ready for discharge and agress.    Personal Factors and  Comorbidities  Age;Comorbidity 3+    Comorbidities  hiatal hernia, hypsertension, diabetes    Examination-Activity Limitations  Continence    Stability/Clinical Decision Making  Evolving/Moderate complexity    Rehab Potential  Excellent    PT Frequency  --    PT Duration  --    PT Treatment/Interventions  Biofeedback;Cryotherapy;Electrical Stimulation;Ultrasound;Therapeutic activities;Therapeutic exercise;Neuromuscular re-education;Manual techniques;Patient/family education;Dry needling    PT Next Visit Plan  Discharge to HEP    PT Home Exercise Plan  Access Code: [email or web address]    Consulted and Agree with Plan of Care  Patient       Patient will benefit from skilled therapeutic intervention in order to improve the following deficits and impairments:  Increased fascial restricitons, Pain, Decreased strength  Visit Diagnosis: Cramp and spasm  Muscle weakness (generalized)  Other lack of coordination     Problem List Patient Active Problem List   Diagnosis Date Noted  . Cameron lesion, chronic   . Osteoarthritis of right knee 01/10/2019  . Obesity with serious comorbidity 09/16/2018  . Hyperlipidemia 08/17/2018  . History of total left knee replacement 08/05/2017  . Open angle with borderline findings and low glaucoma risk in both eyes 04/27/2017  . Obstructive sleep apnea 07/08/2016  . Hypertension   . Type 2 diabetes mellitus with hyperglycemia, without long-term current use of insulin ([place])   . Primary osteoarthritis of right knee 01/12/2012    Earlie Counts, PT 11/23/19 9:24 AM   [facility] [address], [place], [place], [address] Phone: [phone]   Fax:  [phone]  Name: Barbara Jackson MRN: [id number] Date of Birth: 1955-08-10 PHYSICAL THERAPY DISCHARGE SUMMARY  Visits from Start of Care: 8 Current functional level related to goals / functional outcomes: See above.    Remaining deficits: See  above.    Education / Equipment: HEp Plan: Patient agrees to discharge.  Patient goals were met. Patient is being discharged due to meeting the stated rehab goals. Thank you for the referral. Earlie Counts, PT 11/23/19 9:25 AM   ?????

## 2019-12-21 ENCOUNTER — Encounter: Payer: Self-pay | Admitting: Internal Medicine

## 2019-12-26 ENCOUNTER — Ambulatory Visit: Payer: BC Managed Care – PPO | Admitting: Internal Medicine

## 2020-01-05 ENCOUNTER — Telehealth: Payer: Self-pay | Admitting: *Deleted

## 2020-01-05 NOTE — Telephone Encounter (Signed)
Spoke with patient. Patient is requesting an OV for ongoing pelvic pain. States symptoms have not gotten worse, just not resolved. No new symptoms. Has been seen by urology, GI and PT for evaluation.   OV scheduled for 02/06/20 at 4:30pm with Dr. Sabra Heck.   Last AEX 03/22/19  Routing to provider for final review. Patient is agreeable to disposition. Will close encounter.

## 2020-01-05 NOTE — Telephone Encounter (Signed)
Patient is having pelvic pain.

## 2020-01-31 NOTE — Progress Notes (Deleted)
GYNECOLOGY  VISIT  CC:   ***  HPI: 64 y.o. G68P1001 Single White or Caucasian female here for ongoing pelvic pain.  GYNECOLOGIC HISTORY: Patient's last menstrual period was 06/16/2005. Contraception: post menopausal Menopausal hormone therapy: ***  Patient Active Problem List   Diagnosis Date Noted  . Cameron lesion, chronic   . Osteoarthritis of right knee 01/10/2019  . Obesity with serious comorbidity 09/16/2018  . Hyperlipidemia 08/17/2018  . History of total left knee replacement 08/05/2017  . Open angle with borderline findings and low glaucoma risk in both eyes 04/27/2017  . Obstructive sleep apnea 07/08/2016  . Hypertension   . Type 2 diabetes mellitus with hyperglycemia, without long-term current use of insulin ([place])   . Primary osteoarthritis of right knee 01/12/2012    Past Medical History:  Diagnosis Date  . Abnormal pap 1998   Negative Hpv  . Anxiety   . Arthritis   . Cameron lesion, chronic   . Diabetes mellitus   . Dysrhythmia    hx of atrial tachycardia and pacs  . Esophageal reflux   . H/O hiatal hernia   . H/O iron deficiency anemia 08/2016  . Hypercholesteremia   . Hypertension   . Obesity    compulsive overeater  . Osteoarthritis     Past Surgical History:  Procedure Laterality Date  . APPENDECTOMY  1975  . TOTAL KNEE ARTHROPLASTY  01/12/2012   Procedure: TOTAL KNEE ARTHROPLASTY;  Surgeon: Gearlean Alf, MD;  Location: [facility];  Service: Orthopedics;  Laterality: Left;  . TOTAL KNEE ARTHROPLASTY Right 01/10/2019   Procedure: TOTAL KNEE ARTHROPLASTY;  Surgeon: Gaynelle Arabian, MD;  Location: [facility];  Service: Orthopedics;  Laterality: Right;  27mn    MEDS:   Current Outpatient Medications on File Prior to Visit  Medication Sig Dispense Refill  . albuterol (PROAIR HFA) 108 (90 Base) MCG/ACT inhaler Inhale 2 puffs into the lungs every 6 (six) hours as needed for wheezing or shortness of breath.     .Marland Kitchenatorvastatin (LIPITOR) 20 MG tablet Take 20  mg by mouth daily with supper.   0  . Blood Glucose Monitoring Suppl (ONE TOUCH ULTRA 2) w/Device KIT Use to check blood sugar 2-3 times a day 1 each 0  . diazepam (VALIUM) 5 MG tablet Take 5-10 mg by mouth every 8 (eight) hours as needed for anxiety.    . diphenhydrAMINE (BENADRYL ALLERGY) 25 mg capsule Take 25 mg by mouth every 6 (six) hours as needed for allergies.     . fluticasone (FLONASE) 50 MCG/ACT nasal spray Place 2 sprays into both nostrils daily as needed for allergies.     .Marland KitchenFREESTYLE TEST STRIPS test strip   5  . glipiZIDE (GLUCOTROL XL) 5 MG 24 hr tablet TAKE 1 TABLET BY MOUTH EVERY DAY 90 tablet 1  . Iron-FA-B Cmp-C-Biot-Probiotic (FUSION PLUS) CAPS Take 1 capsule by mouth daily.     .Marland Kitchenlosartan-hydrochlorothiazide (HYZAAR) 100-25 MG per tablet Take 1 tablet by mouth every morning.    . metFORMIN (GLUCOPHAGE-XR) 500 MG 24 hr tablet TAKE 4 TABLETS (2,000 MG TOTAL) BY MOUTH DAILY WITH SUPPER. 360 tablet 2  . omeprazole (PRILOSEC) 40 MG capsule Take 40 mg by mouth daily before breakfast.    . oxybutynin (DITROPAN) 5 MG tablet Take 1 tablet (5 mg total) by mouth 3 (three) times daily. (Patient taking differently: Take 5 mg by mouth every 8 (eight) hours as needed for bladder spasms. ) 90 tablet 12  . OFreedom 1  MG/DOSE, 2 MG/1.5ML SOPN INJECT 1 MG INTO THE SKIN ONCE A WEEK. 9 pen 3  . potassium chloride SA (K-DUR) 20 MEQ tablet Take 20 mEq by mouth daily.     . sertraline (ZOLOFT) 50 MG tablet Take 50 mg by mouth daily with supper.    . trimethoprim (TRIMPEX) 100 MG tablet Take 100 mg by mouth daily.    Marland Kitchen zolpidem (AMBIEN) 10 MG tablet Take 10 mg by mouth at bedtime as needed for sleep.      No current facility-administered medications on file prior to visit.    ALLERGIES: Diclofenac, Glucotrol [glipizide], Lisinopril, and Adhesive [tape]  Family History  Problem Relation Age of Onset  . Heart attack Father        Deceased, 48  . COPD Father   . Breast cancer Mother 2        Deceased, 64  . Obesity Sister   . Hypertension Brother   . Healthy Son     SH:  ***  Review of Systems  PHYSICAL EXAMINATION:    LMP 06/16/2005     General appearance: alert, cooperative and appears stated age Neck: no adenopathy, supple, symmetrical, trachea midline and thyroid {CHL AMB PHY EX THYROID NORM DEFAULT:[id number]::"normal to inspection and palpation"} CV:  {Exam; heart brief:31539} Lungs:  {pe lungs ob:314451::"clear to auscultation, no wheezes, rales or rhonchi, symmetric air entry"} Breasts: {Exam; breast:13139::"normal appearance, no masses or tenderness"} Abdomen: soft, non-tender; bowel sounds normal; no masses,  no organomegaly Lymph:  no inguinal LAD noted  Pelvic: External genitalia:  no lesions              Urethra:  normal appearing urethra with no masses, tenderness or lesions              Bartholins and Skenes: normal                 Vagina: normal appearing vagina with normal color and discharge, no lesions              Cervix: {CHL AMB PHY EX CERVIX NORM [phone]::"no lesions"}              Bimanual Exam:  Uterus:  {CHL AMB PHY EX UTERUS NORM DEFAULT:[phone]::"normal size, contour, position, consistency, mobility, non-tender"}              Adnexa: {CHL AMB PHY EX ADNEXA NO MASS DEFAULT:[phone]::"no mass, fullness, tenderness"}              Rectovaginal: {yes no:314532}.  Confirms.              Anus:  normal sphincter tone, no lesions  Chaperone, ***Terence Lux, CMA, was present for exam.  Assessment: ***  Plan: ***   ~{NUMBERS; -10-45 JOINT ROM:10287} minutes spent with patient >50% of time was in face to face discussion of above.

## 2020-02-06 ENCOUNTER — Ambulatory Visit: Payer: Self-pay | Admitting: Obstetrics & Gynecology

## 2020-02-10 NOTE — Progress Notes (Signed)
GYNECOLOGY  VISIT  CC:   LLQ pain/pelvic pain  HPI: 64 y.o. G1P1001 Single White or Caucasian female here for pelvic pain that has been ongoing since September, 2020.  She did have several UTIs in a row and pelvic pain began at the same time.  She did see urology and saw Dr. Matilde Sprang.  She did have a CT scan 05/2019 and office cystoscopy.  She has done pelvic PT as well.  She was followed over a level of 10 weeks.  Pt did have pelvic floor tenderness.  She felt the level of pain improved but has not resolved.  This is low grade but still present.  She does seem to notice more pain in the morning.    She has seen Dr. Collene Mares as well.  Last colonoscopy was 2018.  Last appt was in July.  She does have a hiatal hernia.  She was referred to general surgery.  She's seen Greer Pickerel, MD.  He wants her to lose another 30 pounds before having the surgery.    Denies vaginal bleeding.  She is going to see Dr. Maureen Ralphs and have an evaluation for her hip.    Has questions  poct urine-W[facility] 1+  GYNECOLOGIC HISTORY: Patient's last menstrual period was 06/16/2005. Contraception: post menopausal Menopausal hormone therapy: none  Patient Active Problem List   Diagnosis Date Noted  . Cameron lesion, chronic   . Osteoarthritis of right knee 01/10/2019  . Obesity with serious comorbidity 09/16/2018  . Hyperlipidemia 08/17/2018  . History of total left knee replacement 08/05/2017  . Open angle with borderline findings and low glaucoma risk in both eyes 04/27/2017  . Obstructive sleep apnea 07/08/2016  . Hypertension   . Type 2 diabetes mellitus with hyperglycemia, without long-term current use of insulin ([place])   . Primary osteoarthritis of right knee 01/12/2012    Past Medical History:  Diagnosis Date  . Abnormal pap 1998   Negative Hpv  . Anxiety   . Arthritis   . Cameron lesion, chronic   . Diabetes mellitus   . Dysrhythmia    hx of atrial tachycardia and pacs  . Esophageal reflux   . H/O  hiatal hernia   . H/O iron deficiency anemia 08/2016  . Hypercholesteremia   . Hypertension   . Obesity    compulsive overeater  . Osteoarthritis     Past Surgical History:  Procedure Laterality Date  . APPENDECTOMY  1975  . TOTAL KNEE ARTHROPLASTY  01/12/2012   Procedure: TOTAL KNEE ARTHROPLASTY;  Surgeon: Gearlean Alf, MD;  [facility];  Service: Orthopedics;  Laterality: Left;  . TOTAL KNEE ARTHROPLASTY Right 01/10/2019   Procedure: TOTAL KNEE ARTHROPLASTY;  Surgeon: Gaynelle Arabian, MD;  [facility]: [facility];  Service: Orthopedics;  Laterality: Right;  57mn    MEDS:   Current Outpatient Medications on File Prior to Visit  Medication Sig Dispense Refill  . albuterol (PROAIR HFA) 108 (90 Base) MCG/ACT inhaler Inhale 2 puffs into the lungs every 6 (six) hours as needed for wheezing or shortness of breath.     .Marland Kitchenatorvastatin (LIPITOR) 20 MG tablet Take 20 mg by mouth daily with supper.   0  . Blood Glucose Monitoring Suppl (ONE TOUCH ULTRA 2) w/Device KIT Use to check blood sugar 2-3 times a day 1 each 0  . diazepam (VALIUM) 5 MG tablet Take 5-10 mg by mouth every 8 (eight) hours as needed for anxiety.    . diphenhydrAMINE (BENADRYL ALLERGY) 25 mg capsule  Take 25 mg by mouth every 6 (six) hours as needed for allergies.     . fluticasone (FLONASE) 50 MCG/ACT nasal spray Place 2 sprays into both nostrils daily as needed for allergies.     Marland Kitchen FREESTYLE TEST STRIPS test strip   5  . glipiZIDE (GLUCOTROL XL) 5 MG 24 hr tablet TAKE 1 TABLET BY MOUTH EVERY DAY 90 tablet 1  . Iron-FA-B Cmp-C-Biot-Probiotic (FUSION PLUS) CAPS Take 1 capsule by mouth daily.     Marland Kitchen losartan-hydrochlorothiazide (HYZAAR) 100-25 MG per tablet Take 1 tablet by mouth every morning.    . metFORMIN (GLUCOPHAGE-XR) 500 MG 24 hr tablet TAKE 4 TABLETS (2,000 MG TOTAL) BY MOUTH DAILY WITH SUPPER. 360 tablet 2  . omeprazole (PRILOSEC) 40 MG capsule Take 40 mg by mouth daily before breakfast.    . OZEMPIC, 1 MG/DOSE, 4  MG/3ML SOPN INJECT 1MG INTO THE SKIN ONCE WEEKLY    . potassium chloride SA (K-DUR) 20 MEQ tablet Take 20 mEq by mouth daily.     . sertraline (ZOLOFT) 50 MG tablet Take 50 mg by mouth daily with supper.    . zolpidem (AMBIEN) 10 MG tablet Take 10 mg by mouth at bedtime as needed for sleep.      No current facility-administered medications on file prior to visit.    ALLERGIES: Diclofenac, Glucotrol [glipizide], Lisinopril, and Adhesive [tape]  Family History  Problem Relation Age of Onset  . Heart attack Father        Deceased, 26  . COPD Father   . Breast cancer Mother 10       Deceased, 36  . Obesity Sister   . Hypertension Brother   . Healthy Son     SH:  Single, non smoker  Review of Systems  Constitutional: Negative.   HENT: Negative.   Eyes: Negative.   Respiratory: Negative.   Cardiovascular: Negative.   Gastrointestinal: Negative.   Endocrine: Negative.   Genitourinary:       Pelvic pain  Musculoskeletal: Negative.   Skin: Negative.   Allergic/Immunologic: Negative.   Neurological: Negative.   Hematological: Negative.   Psychiatric/Behavioral: Negative.     PHYSICAL EXAMINATION:    BP 110/80   Pulse 72   Resp 16   Wt 244 lb (110.7 kg)   LMP 06/16/2005   BMI 40.60 kg/m     General appearance: alert, cooperative and appears stated age CV:  Regular rate and rhythm Lungs:  clear to auscultation, no wheezes, rales or rhonchi, symmetric air entry Abdomen: soft, non-tender; bowel sounds normal; no masses,  no organomegaly Lymph:  no inguinal LAD noted  Pelvic: External genitalia:  no lesions              Urethra:  normal appearing urethra with no masses, tenderness or lesions              Bartholins and Skenes: normal                 Vagina: normal appearing vagina with normal color and discharge, no lesions              Cervix: no lesions              Bimanual Exam:  Uterus:  normal size, contour, position, consistency, mobility, non-tender               Adnexa: no mass, fullness, tenderness  Chaperone, Royal Hawthorn, CMA, was present for exam.  Assessment: LLQ pain and pelvic  pain with improved pelvic floor exam today  Plan: She is going to see Dr. Maureen Ralphs in September and will have notes sent to me.  Urine culture pending.   25 minutes total spent with pt

## 2020-02-14 ENCOUNTER — Ambulatory Visit: Payer: BC Managed Care – PPO | Admitting: Obstetrics & Gynecology

## 2020-02-14 ENCOUNTER — Encounter: Payer: Self-pay | Admitting: Obstetrics & Gynecology

## 2020-02-14 ENCOUNTER — Other Ambulatory Visit: Payer: Self-pay

## 2020-02-14 VITALS — BP 110/80 | HR 72 | Resp 16 | Wt 244.0 lb

## 2020-02-14 DIAGNOSIS — Z87440 Personal history of urinary (tract) infections: Secondary | ICD-10-CM

## 2020-02-14 DIAGNOSIS — R102 Pelvic and perineal pain: Secondary | ICD-10-CM | POA: Diagnosis not present

## 2020-02-14 LAB — POCT URINALYSIS DIPSTICK
Bilirubin, UA: NEGATIVE
Blood, UA: NEGATIVE
Glucose, UA: NEGATIVE
Ketones, UA: NEGATIVE
Nitrite, UA: NEGATIVE
Protein, UA: NEGATIVE
Urobilinogen, UA: NEGATIVE E.U./dL — AB
pH, UA: 5 (ref 5.0–8.0)

## 2020-02-16 LAB — URINE CULTURE

## 2020-02-26 ENCOUNTER — Encounter: Payer: Self-pay | Admitting: Internal Medicine

## 2020-02-27 MED ORDER — OZEMPIC (1 MG/DOSE) 4 MG/3ML ~~LOC~~ SOPN: PEN_INJECTOR | SUBCUTANEOUS | 1 refills | Status: DC

## 2020-03-16 ENCOUNTER — Other Ambulatory Visit: Payer: Self-pay

## 2020-03-16 DIAGNOSIS — R935 Abnormal findings on diagnostic imaging of other abdominal regions, including retroperitoneum: Secondary | ICD-10-CM

## 2020-03-20 ENCOUNTER — Ambulatory Visit: Payer: BC Managed Care – PPO | Admitting: Internal Medicine

## 2020-04-09 ENCOUNTER — Ambulatory Visit (HOSPITAL_COMMUNITY)
Admission: RE | Admit: 2020-04-09 | Discharge: 2020-04-09 | Disposition: A | Discharge status: Home / Self Care | Payer: BC Managed Care – PPO | Source: Ambulatory Visit | Attending: Surgery | Admitting: Surgery

## 2020-04-09 ENCOUNTER — Ambulatory Visit: Payer: BC Managed Care – PPO | Admitting: Surgery

## 2020-04-09 ENCOUNTER — Other Ambulatory Visit: Payer: Self-pay

## 2020-04-09 ENCOUNTER — Encounter: Payer: Self-pay | Admitting: Surgery

## 2020-04-09 VITALS — BP 104/74 | HR 64 | Temp 98.0°F | Resp 20 | Ht 65.0 in | Wt 244.9 lb

## 2020-04-09 DIAGNOSIS — R935 Abnormal findings on diagnostic imaging of other abdominal regions, including retroperitoneum: Secondary | ICD-10-CM | POA: Diagnosis not present

## 2020-04-09 DIAGNOSIS — I728 Aneurysm of other specified arteries: Secondary | ICD-10-CM | POA: Diagnosis not present

## 2020-04-09 NOTE — Progress Notes (Signed)
[facility] [place]  Patient name: Barbara Jackson MRN: [id number] DOB: Jul 07, 1955 Sex: female   REQUESTING PROVIDER:    London Pepper   REASON FOR CONSULT:    abnormal CT scan  HISTORY OF PRESENT ILLNESS:   Barbara Jackson is a 64 y.o. female, who is referred for evaluation of a splenic artery aneurysm which was found incidentally on a CT scan for pelvic pain.  The patient does not have any left upper quadrant pain.  Her pain is lower abdomen.  She denies a family history of aneurysmal disease  She denies any history of postprandial abdominal pain.  She is a diabetic.  She is a non-smoker.  She takes a statin for hypercholesterolemia.  PAST MEDICAL HISTORY    Past Medical History:  Diagnosis Date  . Abnormal pap 1998   Negative Hpv  . Anxiety   . Arthritis   . Cameron lesion, chronic   . Diabetes mellitus   . Dysrhythmia    hx of atrial tachycardia and pacs  . Esophageal reflux   . H/O hiatal hernia   . H/O iron deficiency anemia 08/2016  . Hypercholesteremia   . Hypertension   . Obesity    compulsive overeater  . Osteoarthritis      FAMILY HISTORY   Family History  Problem Relation Age of Onset  . Heart attack Father        Deceased, 43  . COPD Father   . Breast cancer Mother 4       Deceased, 24  . Obesity Sister   . Hypertension Brother   . Healthy Son     SOCIAL HISTORY:   Social History   Socioeconomic History  . Marital status: Single    Spouse name: Not on file  . Number of children: 1  . Years of education: Not on file  . Highest education level: Not on file  Occupational History    Employer: [employer]  Tobacco Use  . Smoking status: Never Smoker  . Smokeless tobacco: Never Used  Vaping Use  . Vaping Use: Never used  Substance and Sexual Activity  . Alcohol use: Yes    Alcohol/week: 2.0 standard drinks    Types: 2 Standard drinks or equivalent per week  . Drug  use: No  . Sexual activity: Not Currently    Partners: Male    Birth control/protection: Post-menopausal  Other Topics Concern  . 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   Lives alone.     Highest level of education:  One year graduate school   Social Determinants of Health   Financial Resource Strain:   . Difficulty of Paying Living Expenses: Not on file  Food Insecurity:   . Worried About Charity fundraiser in the Last Year: Not on file  . Ran Out of Food in the Last Year: Not on file  Transportation Needs:   . Lack of Transportation (Medical): Not on file  . Lack of Transportation (Non-Medical): Not on file  Physical Activity:   . Days of Exercise per Week: Not on file  . Minutes of Exercise per Session: Not on file  Stress:   . Feeling of Stress : Not on file  Social Connections:   . Frequency of Communication with Friends and Family: Not on file  . Frequency of Social Gatherings with Friends and Family: Not on file  . Attends Religious  Services: Not on file  . Active Member of Clubs or Organizations: Not on file  . Attends Archivist Meetings: Not on file  . Marital Status: Not on file  Intimate Partner Violence:   . Fear of Current or Ex-Partner: Not on file  . Emotionally Abused: Not on file  . Physically Abused: Not on file  . Sexually Abused: Not on file    ALLERGIES:    Allergies  Allergen Reactions  . Diclofenac Other (See Comments)    GAS  . Glucotrol [Glipizide] Nausea Only  . Lisinopril Cough  . Adhesive [Tape] Rash    CURRENT MEDICATIONS:    Current Outpatient Medications  Medication Sig Dispense Refill  . albuterol (PROAIR HFA) 108 (90 Base) MCG/ACT inhaler Inhale 2 puffs into the lungs every 6 (six) hours as needed for wheezing or shortness of breath.     Marland Kitchen atorvastatin (LIPITOR) 20 MG tablet Take 20 mg by mouth daily with supper.   0  . Blood Glucose Monitoring Suppl (ONE TOUCH ULTRA 2)  w/Device KIT Use to check blood sugar 2-3 times a day 1 each 0  . diazepam (VALIUM) 5 MG tablet Take 5-10 mg by mouth every 8 (eight) hours as needed for anxiety.    . diphenhydrAMINE (BENADRYL ALLERGY) 25 mg capsule Take 25 mg by mouth every 6 (six) hours as needed for allergies.     . fluticasone (FLONASE) 50 MCG/ACT nasal spray Place 2 sprays into both nostrils daily as needed for allergies.     Marland Kitchen FREESTYLE TEST STRIPS test strip   5  . glipiZIDE (GLUCOTROL XL) 5 MG 24 hr tablet TAKE 1 TABLET BY MOUTH EVERY DAY 90 tablet 1  . Iron-FA-B Cmp-C-Biot-Probiotic (FUSION PLUS) CAPS Take 1 capsule by mouth daily.     Marland Kitchen losartan-hydrochlorothiazide (HYZAAR) 100-25 MG per tablet Take 1 tablet by mouth every morning.    . metFORMIN (GLUCOPHAGE-XR) 500 MG 24 hr tablet TAKE 4 TABLETS (2,000 MG TOTAL) BY MOUTH DAILY WITH SUPPER. 360 tablet 2  . omeprazole (PRILOSEC) 40 MG capsule Take 40 mg by mouth daily before breakfast.    . OZEMPIC, 1 MG/DOSE, 4 MG/3ML SOPN INJECT 1MG INTO THE SKIN ONCE WEEKLY 9 mL 1  . potassium chloride SA (K-DUR) 20 MEQ tablet Take 20 mEq by mouth daily.     . sertraline (ZOLOFT) 50 MG tablet Take 50 mg by mouth daily with supper.    . zolpidem (AMBIEN) 10 MG tablet Take 10 mg by mouth at bedtime as needed for sleep.      No current facility-administered medications for this visit.    REVIEW OF SYSTEMS:   _0  denotes positive finding, _1  denotes negative finding Cardiac  Comments:  Chest pain or chest pressure:    Shortness of breath upon exertion:    Short of breath when lying flat:    Irregular heart rhythm:        Vascular    Pain in calf, thigh, or hip brought on by ambulation:    Pain in feet at night that wakes you up from your sleep:     Blood clot in your veins:    Leg swelling:         Pulmonary    Oxygen at home:    Productive cough:     Wheezing:         Neurologic    Sudden weakness in arms or legs:     Sudden numbness in arms or legs:  Sudden  onset of difficulty speaking or slurred speech:    Temporary loss of vision in one eye:     Problems with dizziness:         Gastrointestinal    Blood in stool:      Vomited blood:         Genitourinary    Burning when urinating:     Blood in urine:        Psychiatric    Major depression:         Hematologic    Bleeding problems:    Problems with blood clotting too easily:        Skin    Rashes or ulcers:        Constitutional    Fever or chills:     PHYSICAL EXAM:   There were no vitals filed for this visit.  GENERAL: The patient is a well-nourished female, in no acute distress. The vital signs are documented above. CARDIAC: There is a regular rate and rhythm.  VASCULAR: Palpable dorsalis pedis pulses PULMONARY: Nonlabored respirations ABDOMEN: Soft and non-tender   MUSCULOSKELETAL: There are no major deformities or cyanosis. NEUROLOGIC: No focal weakness or paresthesias are detected. SKIN: There are no ulcers or rashes noted. PSYCHIATRIC: The patient has a normal affect.  STUDIES:   I have reviewed the following CT scan:  06/02/2019:  1.8 cm distal splenic artery aneurysm  Mesenteric Duplex:  Mesenteric:  70 to 99% stenosis in the superior mesenteric artery.  Unable to identify splenic artery aneurysm.  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  I would not recommend intervention until it becomes greater than at least 2 cm.  I will plan on repeating a CT scan in 6 months.  We will try to schedule this before her hiatal hernia surgery.  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  She is asymptomatic and so nothing needs to be done about this other than evaluating it when she gets her CT  scan.  Leia Alf, MD, FACS [facility] [phone] Pager [phone]

## 2020-04-16 ENCOUNTER — Encounter: Payer: BC Managed Care – PPO | Admitting: Surgery

## 2020-04-16 ENCOUNTER — Encounter (HOSPITAL_COMMUNITY): Payer: BC Managed Care – PPO

## 2020-05-16 ENCOUNTER — Other Ambulatory Visit: Payer: Self-pay

## 2020-05-16 ENCOUNTER — Encounter: Payer: Self-pay | Admitting: Internal Medicine

## 2020-05-16 ENCOUNTER — Ambulatory Visit: Payer: BC Managed Care – PPO | Admitting: Internal Medicine

## 2020-05-16 VITALS — BP 128/82 | HR 75 | Ht 65.0 in | Wt 241.8 lb

## 2020-05-16 DIAGNOSIS — E785 Hyperlipidemia, unspecified: Secondary | ICD-10-CM | POA: Diagnosis not present

## 2020-05-16 DIAGNOSIS — E669 Obesity, unspecified: Secondary | ICD-10-CM

## 2020-05-16 DIAGNOSIS — E1165 Type 2 diabetes mellitus with hyperglycemia: Secondary | ICD-10-CM

## 2020-05-16 LAB — POCT GLYCOSYLATED HEMOGLOBIN (HGB A1C): Hemoglobin A1C: 6.3 % — AB (ref 4.0–5.6)

## 2020-05-16 MED ORDER — GLIPIZIDE ER 5 MG PO TB24
5.0000 mg | ORAL_TABLET | Freq: Every day | ORAL | 3 refills | Status: DC
Start: 2020-05-16 — End: 2020-11-01

## 2020-05-16 MED ORDER — METFORMIN HCL ER 500 MG PO TB24
2000.0000 mg | ORAL_TABLET | Freq: Every day | ORAL | 3 refills | Status: DC
Start: 2020-05-16 — End: 2021-05-28

## 2020-05-16 NOTE — Addendum Note (Signed)
Addended by: Lauralyn Primes on: 05/16/2020 11:16 AM   Modules accepted: Orders

## 2020-05-16 NOTE — Patient Instructions (Addendum)
Please continue: - Metformin ER 2000 mg after dinner - Ozempic 1 mg weekly  Please increase: - Glipizide ER 10 mg before b'fast (after the holidays, try to back off to 5 mg again)  Please return in 4 months with your sugar log.

## 2020-05-16 NOTE — Progress Notes (Signed)
Patient ID: MIXTLI RENO, female   DOB: 02-25-1956, 64 y.o.   MRN: [id number]   This visit occurred during the SARS-CoV-2 public health emergency.  Safety protocols were in place, including screening questions prior to the visit, additional usage of staff PPE, and extensive cleaning of exam room while observing appropriate contact time as indicated for disinfecting solutions.   HPI: CALINA PATRIE is a 64 y.o.-year-old female, returning for follow-up for DM2, dx in 2007, non-insulin-dependent, now more controlled, without long-term complications.  Last visit 8 months ago.  Reviewed HbA1c levels: 11/25/2019: HbA1c 6.6% Lab Results  Component Value Date   HGBA1C 6.1 (A) 08/23/2019   HGBA1C 6.4 (A) 04/25/2019   HGBA1C 6.2 (A) 12/20/2018   HGBA1C 6.5 (A) 08/17/2018   HGBA1C 6.1 (A) 05/03/2018  02/22/2018: HbA1c 7.0% 07/2017: HbA1c 7.7% 07/10/2016: HbA1c 7.0%  Pt is on a regimen of: - Metformin ER 2000 mg after dinner - Glipizide XL 10 >> 5 mg before breakfast - Ozempic 0.5 >> 1 mg weekly in a.m. We stopped Actos 08/17/2018. She tried Januvia, but stopped last year when she was on a new diet at that time and did not want to make any changes in her medicines.  Pt checks her sugars  0 to once a day per review of her log - am: 112-143 >> 128-146 >> 119-134 >> 127-154, 159 - 2h after b'fast: 128, 141 >> 94-134, 157 >> 129-169 - before lunch: 97-135 >> 118-128 >> 89-109 >> 110-141, 160 (snack) - 2h after lunch:  152, 168, 238 >> 147-189 >> 135--165, 190 - before dinner: 117, 134 >> 98-136 >> 105, 116-162, 180 - 2h after dinner: 120-158 >> 163 >> 126, 134, 230 >> 107-143, 180 - bedtime: 110, 129 >> 99, 146 >> 121, 133, 180 >> 110-154, 171 - nighttime: n/c >> 120, 133 >> n/c Lowest sugar was  99 >> 89 >> 107; she has hypoglycemia awareness in the 70s. Highest sugar was  238 (dessert) >> 230 >> 190.  Glucometer: One Touch Ultra 2  In the past, she read Dr. Janene Harvey program for  reversing diabetes program started to adopt the changes suggested in the log.  Her sugars started to improve significantly.   She also saw [name] with nutrition in the past.  She was being seen in the binge eating clinic at [facility].  However, she was not seen in the last 2 years.  She continues to exercise on her recumbent bike.  -No CKD, last BUN/creatinine:  11/25/2019: 23/0.89, GFR 64 Lab Results  Component Value Date   BUN 14 01/12/2019   BUN 15 01/11/2019   CREATININE 0.76 01/12/2019   CREATININE 0.90 01/11/2019  02/22/2018: 17/0.88, GFR 65, glucose 136, with the rest of the CMP normal; ACR 6.4 07/10/2016: 20/0.83 On losartan 100.  -+ HL;  last set of lipids: 06/12/2019: 170/137/76/71 02/22/2018: 139/109/71/46 07/10/2016: 164/122/84/32 No results found for: CHOL, HDL, LDLCALC, LDLDIRECT, TRIG, CHOLHDL  On Lipitor 40.  - last eye exam was in 09/20/2019: No DR; glaucoma suspect [facility]).   - no numbness and tingling in her feet.  Pt has FH of DM in MGM.  She is also on Cymbalta for depression.   She also has a history of HTN.   She also has a history of frequent UTIs and pelvic floor pain.  She was on trimethoprim chronically.  She saw several specialists but no clear diagnosis was made.  ROS: Constitutional: no weight gain/no weight loss,  no fatigue, no subjective hyperthermia, no subjective hypothermia Eyes: no blurry vision, no xerophthalmia ENT: no sore throat, no nodules palpated in neck, no dysphagia, no odynophagia, no hoarseness Cardiovascular: no CP/no SOB/no palpitations/no leg swelling Respiratory: no cough/no SOB/no wheezing Gastrointestinal: no N/no V/no D/no C/no acid reflux, + AP Musculoskeletal: no muscle aches/no joint aches Skin: no rashes, no hair loss Neurological: no tremors/no numbness/no tingling/no dizziness  I reviewed pt's medications, allergies, PMH, social hx, family hx, and changes were documented in the history of present  illness. Otherwise, unchanged from my initial visit note.  Past Medical History:  Diagnosis Date  . Abnormal pap 1998   Negative Hpv  . Anxiety   . Arthritis   . Cameron lesion, chronic   . Diabetes mellitus   . Dysrhythmia    hx of atrial tachycardia and pacs  . Esophageal reflux   . H/O hiatal hernia   . H/O iron deficiency anemia 08/2016  . Hypercholesteremia   . Hypertension   . Obesity    compulsive overeater  . Osteoarthritis    Past Surgical History:  Procedure Laterality Date  . APPENDECTOMY  1975  . TOTAL KNEE ARTHROPLASTY  01/12/2012   Procedure: TOTAL KNEE ARTHROPLASTY;  Surgeon: Gearlean Alf, MD;  Location: [facility];  Service: Orthopedics;  Laterality: Left;  . TOTAL KNEE ARTHROPLASTY Right 01/10/2019   Procedure: TOTAL KNEE ARTHROPLASTY;  Surgeon: Gaynelle Arabian, MD;  Location: [facility];  Service: Orthopedics;  Laterality: Right;  36mn   Social History   Socioeconomic History  . Marital status: Divorced    Spouse name: Not on file  . Number of children: 1  . Years of education: Not on file  . 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 Smoking status: Never Smoker  . Smokeless tobacco: Never Used  Substance and Sexual Activity  . Alcohol use: Yes    Alcohol/week:  Wine, cocktails    Types: 1-2 Standard drinks or equivalent per week  . Drug use: No  . Sexual activity: Never    Partners: Male    Birth control/protection: Post-menopausal  Social History Narrative   She works as a UHydrologistin tBiomedical scientist   Lives alone.     Highest level of education:  One year graduate school   Current Outpatient Medications on File Prior to Visit  Medication Sig Dispense Refill  . albuterol (PROAIR HFA) 108 (90 Base) MCG/ACT inhaler Inhale 2 puffs into the lungs every 6 (six) hours as needed for wheezing or shortness of breath.     .Marland Kitchenatorvastatin (LIPITOR) 20 MG tablet Take 20  mg by mouth daily with supper.   0  . Blood Glucose Monitoring Suppl (ONE TOUCH ULTRA 2) w/Device KIT Use to check blood sugar 2-3 times a day 1 each 0  . diazepam (VALIUM) 5 MG tablet Take 5-10 mg by mouth every 8 (eight) hours as needed for anxiety.    . diphenhydrAMINE (BENADRYL ALLERGY) 25 mg capsule Take 25 mg by mouth every 6 (six) hours as needed for allergies.     . fluticasone (FLONASE) 50 MCG/ACT nasal spray Place 2 sprays into both nostrils daily as needed for allergies.     .Marland KitchenFREESTYLE TEST STRIPS test strip   5  . glipiZIDE (GLUCOTROL XL) 5 MG 24 hr tablet TAKE 1 TABLET BY MOUTH EVERY DAY 90 tablet 1  . Iron-FA-B Cmp-C-Biot-Probiotic (  FUSION PLUS) CAPS Take 1 capsule by mouth daily.     Marland Kitchen losartan-hydrochlorothiazide (HYZAAR) 100-25 MG per tablet Take 1 tablet by mouth every morning.    . metFORMIN (GLUCOPHAGE-XR) 500 MG 24 hr tablet TAKE 4 TABLETS (2,000 MG TOTAL) BY MOUTH DAILY WITH SUPPER. 360 tablet 2  . omeprazole (PRILOSEC) 40 MG capsule Take 40 mg by mouth daily before breakfast.    . OZEMPIC, 1 MG/DOSE, 4 MG/3ML SOPN INJECT 1MG INTO THE SKIN ONCE WEEKLY 9 mL 1  . sertraline (ZOLOFT) 50 MG tablet Take 50 mg by mouth daily with supper.    . zolpidem (AMBIEN) 10 MG tablet Take 10 mg by mouth at bedtime as needed for sleep.      No current facility-administered medications on file prior to visit.   Allergies  Allergen Reactions  . Diclofenac Other (See Comments)    GAS  . Glucotrol [Glipizide] Nausea Only  . Lisinopril Cough  . Adhesive [Tape] Rash   Family History  Problem Relation Age of Onset  . Heart attack Father        Deceased, 23  . COPD Father   . Breast cancer Mother 55       Deceased, 31  . Obesity Sister   . Hypertension Brother   . Healthy Son     PE: BP 128/82   Pulse 75   Ht '5\' 5"'  (1.651 m)   Wt 241 lb 12.8 oz (109.7 kg)   LMP 06/16/2005   SpO2 95%   BMI 40.24 kg/m  Wt Readings from Last 3 Encounters:  05/16/20 241 lb 12.8 oz (109.7 kg)   04/09/20 244 lb 14.4 oz (111.1 kg)  02/14/20 244 lb (110.7 kg)   Constitutional: overweight, in NAD Eyes: PERRLA, EOMI, no exophthalmos ENT: moist mucous membranes, no thyromegaly, no cervical lymphadenopathy Cardiovascular: RRR, No MRG Respiratory: CTA B Gastrointestinal: abdomen soft, NT, ND, BS+ Musculoskeletal: no deformities, strength intact in all 4 Skin: moist, warm, no rashes Neurological: no tremor with outstretched hands, DTR normal in all 4  ASSESSMENT: 1. DM2, non-insulin-dependent, now more controlled, without long-term complications, but with hyperglycemia  2. HL  3. Obesity class II  PLAN:  1. Patient with longstanding, previously uncontrolled type 2 diabetes, on oral antidiabetic regimen with Metformin, sulfonylurea, and also weekly GLP-1 receptor agonist, with improved blood sugars and weight before last visit. She had to lose weight initially preparation for knee surgery and after the surgery, she felt better and starting to walk. At last visit, sugars were almost all at goal with only few hyperglycemic exceptions, but we did not change the regimen at that time. HbA1c was excellent, at 6.1%. Since then, however, she had to cancel 2 appointments and an HbA1c obtained 5 months ago was higher, at 6.6%. -At today's visit, sugars appear slightly higher than the ones at last visit.  In the morning, she is mostly above target and the sugars remain slightly high throughout the day.  For now, I advised her to increase the dose of glipizide ER from 5 to 10 mg daily.  However, after the holidays, when sugars started to improve, I advised her to try to back off to 5 mg daily again.  We will continue the rest of her regimen. - I suggested to:  Patient Instructions  Please continue: - Metformin ER 2000 mg after dinner - Glipizide ER 5 mg before b'fast - Ozempic 1 mg weekly  Please return in 4 months with your sugar log  -  we checked her HbA1c: 6.3% (improved) - advised to  check sugars at different times of the day - 1x a day, rotating check times - advised for yearly eye exams >> she is UTD - return to clinic in 4 months  2. HL -Reviewed latest lipid panel from 05/2019: Fractions at goal -She continues Lipitor 20, without side effects  3. Obesity class 2  -She was seen at [facility] for binge eating disorder, but not in the last 3 years -We will continue Ozempic which should also help with weight loss -She was also advised to lose weight by her gastroenterologist before a hiatal hernia repair  Philemon Kingdom, MD PhD [facility]

## 2020-05-21 ENCOUNTER — Ambulatory Visit: Payer: BC Managed Care – PPO | Admitting: Nurse Practitioner

## 2020-05-21 ENCOUNTER — Encounter: Payer: Self-pay | Admitting: Nurse Practitioner

## 2020-05-21 ENCOUNTER — Other Ambulatory Visit: Payer: Self-pay

## 2020-05-21 ENCOUNTER — Telehealth: Payer: Self-pay

## 2020-05-21 VITALS — BP 118/70 | HR 70 | Resp 16 | Wt 248.0 lb

## 2020-05-21 DIAGNOSIS — R102 Pelvic and perineal pain: Secondary | ICD-10-CM | POA: Diagnosis not present

## 2020-05-21 LAB — POCT URINALYSIS DIPSTICK
Bilirubin, UA: NEGATIVE
Blood, UA: NEGATIVE
Glucose, UA: POSITIVE — AB
Ketones, UA: NEGATIVE
Leukocytes, UA: NEGATIVE
Nitrite, UA: NEGATIVE
Protein, UA: NEGATIVE
Urobilinogen, UA: NEGATIVE E.U./dL — AB
pH, UA: 5 (ref 5.0–8.0)

## 2020-05-21 NOTE — Telephone Encounter (Signed)
Patient states she is having "ongoing abdominal pain".

## 2020-05-21 NOTE — Telephone Encounter (Signed)
Patient complaining of low pelvic discomfort which she has seen several specialist for in the past year. Symptoms have worsened in past week and she feels she may have a UTI. Patient requesting office visit to have urine checked and pelvic exam. Made appointment with Karma Ganja NP at 2:00pm with arrival time of 1:45pm.

## 2020-05-21 NOTE — Patient Instructions (Addendum)
Points we discussed today: 1. Speak with your PT about abdominal binder 2. Urine sent for culture 3. Consider Weight loss 4. Consider Tylenol or OTC pain reliever as needed for pain 5. Make appointment with Dr. Quincy Simmonds for your annual exam

## 2020-05-21 NOTE — Progress Notes (Signed)
GYNECOLOGY  VISIT  CC:   Pelvic pain  HPI: 64 y.o. G33P1001 Single White or Caucasian female here for uti,pelvic pain poct urine-glucose 50.  Has had a lot of pelvic discomfort since October 2020. Had a Pelvic and abdominal CT 05/2019 (reproductive organs WNL) Has had multiple appointments with Dr. Sabra Heck for pain.  Had history of several UTIs in past. Wanted to make sure she doesn't have a bladder infection now. Denies urinary pain, no frequency or urgency but wants to rule out bladder infection today with culture because of her history. Asking questions about 2nd opinion from female urologist. Concern pain may be related to bladder. Urinates 4-5 times through out the day and once at night.  Noticed her pelvic pain has increased and changed over the past week. Needing a heating pad daily. Does not take medication for this. Can not attribute it to any activity (no recent change in activity). Has been going to pelvic physical therapy x 2 months,has 1 appointment left. It helped initially but not sure it is helping anymore. Pain now feels like a "stitch" and is bilateral in lower pelvic area.  Denies vaginal bleeding    GYNECOLOGIC HISTORY: Patient's last menstrual period was 06/16/2005. Contraception: post menopausal Menopausal hormone therapy: none  Patient Active Problem List   Diagnosis Date Noted  . Pain in right hand 11/23/2019  . Pain of left hand 11/23/2019  . Cameron lesion, chronic   . Aftercare 02/15/2019  . History of total knee replacement, right 02/15/2019  . Stiffness of right knee 01/17/2019  . Osteoarthritis of right knee 01/10/2019  . Obesity with serious comorbidity 09/16/2018  . Hyperlipidemia 08/17/2018  . History of total left knee replacement 08/05/2017  . Open angle with borderline findings and low glaucoma risk in both eyes 04/27/2017  . Obstructive sleep apnea 07/08/2016  . Hypertension   . Type 2 diabetes mellitus with hyperglycemia, without long-term  current use of insulin ([place])   . Primary osteoarthritis of right knee 01/12/2012    Past Medical History:  Diagnosis Date  . Abnormal pap 1998   Negative Hpv  . Anxiety   . Arthritis   . Cameron lesion, chronic   . Diabetes mellitus   . Dysrhythmia    hx of atrial tachycardia and pacs  . Esophageal reflux   . H/O hiatal hernia   . H/O iron deficiency anemia 08/2016  . Hypercholesteremia   . Hypertension   . Obesity    compulsive overeater  . Osteoarthritis     Past Surgical History:  Procedure Laterality Date  . APPENDECTOMY  1975  . TOTAL KNEE ARTHROPLASTY  01/12/2012   Procedure: TOTAL KNEE ARTHROPLASTY;  Surgeon: Gearlean Alf, MD;  Location: [facility];  Service: Orthopedics;  Laterality: Left;  . TOTAL KNEE ARTHROPLASTY Right 01/10/2019   Procedure: TOTAL KNEE ARTHROPLASTY;  Surgeon: Gaynelle Arabian, MD;  Location: [facility];  Service: Orthopedics;  Laterality: Right;  34mn    MEDS:   Current Outpatient Medications on File Prior to Visit  Medication Sig Dispense Refill  . albuterol (PROAIR HFA) 108 (90 Base) MCG/ACT inhaler Inhale 2 puffs into the lungs every 6 (six) hours as needed for wheezing or shortness of breath.     .Marland Kitchenatorvastatin (LIPITOR) 20 MG tablet Take 20 mg by mouth daily with supper.   0  . Blood Glucose Monitoring Suppl (ONE TOUCH ULTRA 2) w/Device KIT Use to check blood sugar 2-3 times a day 1 each 0  . diazepam (VALIUM)  5 MG tablet Take 5-10 mg by mouth every 8 (eight) hours as needed for anxiety.    . diphenhydrAMINE (BENADRYL ALLERGY) 25 mg capsule Take 25 mg by mouth every 6 (six) hours as needed for allergies.     . fluticasone (FLONASE) 50 MCG/ACT nasal spray Place 2 sprays into both nostrils daily as needed for allergies.     Marland Kitchen FREESTYLE TEST STRIPS test strip   5  . glipiZIDE (GLUCOTROL XL) 5 MG 24 hr tablet Take 1-2 tablets (5-10 mg total) by mouth daily. 180 tablet 3  . Iron-FA-B Cmp-C-Biot-Probiotic (FUSION PLUS) CAPS Take 1 capsule by mouth  daily.     Marland Kitchen losartan-hydrochlorothiazide (HYZAAR) 100-25 MG per tablet Take 1 tablet by mouth every morning.    . metFORMIN (GLUCOPHAGE-XR) 500 MG 24 hr tablet Take 4 tablets (2,000 mg total) by mouth daily with supper. 360 tablet 3  . omeprazole (PRILOSEC) 40 MG capsule Take 40 mg by mouth daily before breakfast.    . OZEMPIC, 1 MG/DOSE, 4 MG/3ML SOPN INJECT 1MG INTO THE SKIN ONCE WEEKLY 9 mL 1  . potassium chloride (KLOR-CON) 10 MEQ tablet Take 10 mEq by mouth daily.    . sertraline (ZOLOFT) 50 MG tablet Take 50 mg by mouth daily with supper.    . zolpidem (AMBIEN) 10 MG tablet Take 10 mg by mouth at bedtime as needed for sleep.      No current facility-administered medications on file prior to visit.    ALLERGIES: Diclofenac, Glucotrol [glipizide], Lisinopril, and Adhesive [tape]  Family History  Problem Relation Age of Onset  . Heart attack Father        Deceased, 71  . COPD Father   . Breast cancer Mother 42       Deceased, 40  . Obesity Sister   . Hypertension Brother   . Healthy Son     Review of Systems  Constitutional: Negative.   HENT: Negative.   Eyes: Negative.   Respiratory: Negative.   Cardiovascular: Negative.   Gastrointestinal: Negative.   Endocrine: Negative.   Genitourinary:       Pelvic pain  Musculoskeletal: Negative.   Skin: Negative.   Allergic/Immunologic: Negative.   Neurological: Negative.   Hematological: Negative.   Psychiatric/Behavioral: Negative.     PHYSICAL EXAMINATION:    BP 118/70   Pulse 70   Resp 16   Wt 248 lb (112.5 kg)   LMP 06/16/2005   BMI 41.27 kg/m     General ppearance: alert, cooperative and appears stated age No acute distress Lymph:  no inguinal LAD noted  Pelvic: External genitalia:  no lesions              Urethra:  normal appearing urethra with no masses, tenderness or lesions              Bartholins and Skenes: normal                 Vagina: appropriate vagina for age with normal color and discharge, no  lesions              Cervix: no cervical motion tenderness and no lesions              Bimanual Exam:  Uterus:  normal size, contour, position, consistency, mobility, non-tender              Adnexa: no mass, fullness, tenderness   Chaperone, Taylor, CMA, was present for exam.  Assessment: Pelvic pain  Plan:  Points we discussed today: 1. Speak with your PT about abdominal binder 2. Urine sent for culture 3. Consider Weight loss 4. Consider Tylenol or OTC pain reliever as needed for pain 5. Make appointment with Dr. Quincy Simmonds for your annual exam

## 2020-05-22 ENCOUNTER — Encounter: Payer: Self-pay | Admitting: Obstetrics and Gynecology

## 2020-05-22 ENCOUNTER — Other Ambulatory Visit: Payer: Self-pay | Admitting: Nurse Practitioner

## 2020-05-22 ENCOUNTER — Telehealth: Payer: Self-pay

## 2020-05-22 DIAGNOSIS — R102 Pelvic and perineal pain: Secondary | ICD-10-CM

## 2020-05-22 MED ORDER — CELECOXIB 200 MG PO CAPS: 200.0000 mg | ORAL_CAPSULE | Freq: Two times a day (BID) | ORAL | 0 refills | Status: DC

## 2020-05-22 NOTE — Telephone Encounter (Signed)
Yes, we did mention taking a Non-steroidal anti-inflammatory medication to try for your pain. I am sorry, I may have misunderstood about you using over-the counter. I think it is reasonable to start Celebrex for the short term. You can take it twice a day and I will send in your prescription. Thanks you, Karma Ganja

## 2020-05-22 NOTE — Telephone Encounter (Signed)
Routing to [place], NP, please advise

## 2020-05-22 NOTE — Telephone Encounter (Signed)
Spoke with pt. Pt given update from [place], NP. Pt verbalized understanding and is agreeable Rx.  Pt thankful for call and Rx. Will call in 1 month with update  Encounter closed

## 2020-05-22 NOTE — Telephone Encounter (Signed)
Pt sent the following mychart message:  Barbara, Jackson "Barbara Jackson"  P [facility] Reggie Pile,   Did you mention Celebrex to me at some point in our appointment yesterday? Maybe I'm mis-remembering.   If you did mention it, is it something I can get Rx for? Or is Tylenol best bet to try for pain?   Thanks,  Barbara Jackson

## 2020-05-23 LAB — URINE CULTURE

## 2020-06-17 ENCOUNTER — Other Ambulatory Visit: Payer: Self-pay | Admitting: Internal Medicine

## 2020-06-21 ENCOUNTER — Telehealth: Payer: Self-pay | Admitting: Nurse Practitioner

## 2020-06-21 ENCOUNTER — Other Ambulatory Visit: Payer: Self-pay | Admitting: Nurse Practitioner

## 2020-06-21 ENCOUNTER — Other Ambulatory Visit: Payer: Self-pay

## 2020-06-21 DIAGNOSIS — R102 Pelvic and perineal pain: Secondary | ICD-10-CM

## 2020-06-21 MED ORDER — CELECOXIB 200 MG PO CAPS: 200.0000 mg | ORAL_CAPSULE | Freq: Every day | ORAL | 1 refills | Status: DC

## 2020-06-21 NOTE — Telephone Encounter (Signed)
Staff message received "Patient called stating that her refill request has been denied."

## 2020-06-21 NOTE — Telephone Encounter (Signed)
erro  neous encounter

## 2020-06-21 NOTE — Telephone Encounter (Signed)
Patient called requesting refill on generic Celebrex. York Spaniel it has helped and she would like try another 30 days.

## 2020-06-22 NOTE — Telephone Encounter (Signed)
Spoke with patient and let her know Rx has been refilled.

## 2020-06-22 NOTE — Telephone Encounter (Signed)
Refill was sent

## 2020-06-25 ENCOUNTER — Encounter: Payer: Self-pay | Admitting: Obstetrics and Gynecology

## 2020-06-26 ENCOUNTER — Ambulatory Visit: Payer: BC Managed Care – PPO | Admitting: Obstetrics & Gynecology

## 2020-08-23 ENCOUNTER — Other Ambulatory Visit: Payer: Self-pay | Admitting: Nurse Practitioner

## 2020-08-23 DIAGNOSIS — R102 Pelvic and perineal pain: Secondary | ICD-10-CM

## 2020-08-23 NOTE — Telephone Encounter (Signed)
Annual exam scheduled on 09/26/20

## 2020-09-12 ENCOUNTER — Other Ambulatory Visit: Payer: Self-pay

## 2020-09-12 DIAGNOSIS — R935 Abnormal findings on diagnostic imaging of other abdominal regions, including retroperitoneum: Secondary | ICD-10-CM

## 2020-09-12 DIAGNOSIS — I728 Aneurysm of other specified arteries: Secondary | ICD-10-CM

## 2020-09-19 ENCOUNTER — Other Ambulatory Visit: Payer: Self-pay

## 2020-09-19 ENCOUNTER — Encounter: Payer: Self-pay | Admitting: Internal Medicine

## 2020-09-19 ENCOUNTER — Ambulatory Visit: Payer: BC Managed Care – PPO | Admitting: Internal Medicine

## 2020-09-19 VITALS — BP 120/70 | HR 74 | Ht 65.0 in | Wt 248.8 lb

## 2020-09-19 DIAGNOSIS — E669 Obesity, unspecified: Secondary | ICD-10-CM

## 2020-09-19 DIAGNOSIS — E1165 Type 2 diabetes mellitus with hyperglycemia: Secondary | ICD-10-CM | POA: Diagnosis not present

## 2020-09-19 DIAGNOSIS — E785 Hyperlipidemia, unspecified: Secondary | ICD-10-CM

## 2020-09-19 LAB — POCT GLYCOSYLATED HEMOGLOBIN (HGB A1C): Hemoglobin A1C: 6.1 % — AB (ref 4.0–5.6)

## 2020-09-19 MED ORDER — OZEMPIC (1 MG/DOSE) 4 MG/3ML ~~LOC~~ SOPN: PEN_INJECTOR | SUBCUTANEOUS | 3 refills | Status: DC

## 2020-09-19 NOTE — Progress Notes (Signed)
Patient ID: Barbara Jackson, female   DOB: June 28, 1955, 65 y.o.   MRN: [id number]   This visit occurred during the SARS-CoV-2 public health emergency.  Safety protocols were in place, including screening questions prior to the visit, additional usage of staff PPE, and extensive cleaning of exam room while observing appropriate contact time as indicated for disinfecting solutions.   HPI: Barbara Jackson is a 65 y.o.-year-old female, returning for follow-up for DM2, dx in 2007, non-insulin-dependent, now more controlled, without long-term complications.  Last visit 4 months ago.  Interim history: She continues to exercise on her recumbent bike. She also continues swimming once a week, walking 30 min 2x a week. At this visit, she has no complaints. No blurry vision, increased urination, GI symptoms.  Reviewed HbA1c levels: 07/25/2020: HbA1c 6.4% Lab Results  Component Value Date   HGBA1C 6.3 (A) 05/16/2020   HGBA1C 6.1 (A) 08/23/2019   HGBA1C 6.4 (A) 04/25/2019   HGBA1C 6.2 (A) 12/20/2018   HGBA1C 6.5 (A) 08/17/2018   HGBA1C 6.1 (A) 05/03/2018  11/25/2019: HbA1c 6.6% 02/22/2018: HbA1c 7.0% 07/2017: HbA1c 7.7% 07/10/2016: HbA1c 7.0%  Pt is on a regimen of: - Metformin ER 2000 mg after dinner - Glipizide XL 10 >> 5 >> 10 mg before breakfast - Ozempic 0.5 >> 1 mg weekly in a.m. We stopped Actos 08/17/2018. She tried Januvia, but stopped last year when she was on a new diet at that time and did not want to make any changes in her medicines.  Pt checks her sugars  0 to once a day per review of her log: - am: 128-146 >> 119-134 >> 127-154, 159 >> 113-135, 148, 153 - 2h after b'fast: 128, 141 >> 94-134, 157 >> 129-169 >> 124-147, 167 - before lunch: 89-109 >> 110-141, 160 (snack) >> 102-120 - 2h after lunch:  152, 168, 238 >> 147-189 >> 135--165, 190 >> 103-177 - before dinner: 117, 134 >> 98-136 >> 105, 116-162, 180 >> 82-134, 178 - 2h after dinner: 163 >> 126, 134, 230 >> 107-143, 180 >>  113-164 - bedtime:99, 146 >> 121, 133, 180 >> 110-154, 171 >> 137-151 - nighttime: n/c >> 120, 133 >> n/c Lowest sugar was  99 >> 89 >> 107; she has hypoglycemia awareness in the 70s. Highest sugar was  238 (dessert) >> 230 >> 190.  Glucometer: One Touch Ultra 2  In the past, she read Dr. Janene Harvey program for reversing diabetes program started to adopt the changes suggested in the log.  Her sugars started to improve significantly.   She also saw [name] with nutrition in the past.  She was being seen in the binge eating clinic at [facility].  However, she was not seen in the last 2 years.  -No CKD, last BUN/creatinine:  07/25/2020:17/0.8 11/25/2019: 23/0.89, GFR 64 Lab Results  Component Value Date   BUN 14 01/12/2019   BUN 15 01/11/2019   CREATININE 0.76 01/12/2019   CREATININE 0.90 01/11/2019  02/22/2018: 17/0.88, GFR 65, glucose 136, with the rest of the CMP normal; ACR 6.4 07/10/2016: 20/0.83 On losartan 100.  -+ HL;  last set of lipids: 07/25/2020: 145/161/83/38 06/12/2019: 170/137/76/71 02/22/2018: 139/109/71/46 07/10/2016: 164/122/84/32 No results found for: CHOL, HDL, LDLCALC, LDLDIRECT, TRIG, CHOLHDL  On Lipitor 40.  - last eye exam was in 04/2020: No DR; glaucoma suspect [facility]).   - no numbness and tingling in her feet.  Pt has FH of DM in [place].  She is also on Cymbalta for  depression.   She also has a history of HTN.   She also has a history of frequent UTIs and pelvic floor pain.  She was on trimethoprim chronically.  She saw several specialists but no clear diagnosis was made. She is on iron - ~50% of the time.  ROS: Constitutional: no weight gain/no weight loss, no fatigue, no subjective hyperthermia, no subjective hypothermia Eyes: no blurry vision, no xerophthalmia ENT: no sore throat, no nodules palpated in neck, no dysphagia, no odynophagia, no hoarseness Cardiovascular: no CP/no SOB/no palpitations/no leg swelling Respiratory: no cough/no  SOB/no wheezing Gastrointestinal: no N/no V/no D/no C/no acid reflux, + AP Musculoskeletal: no muscle aches/no joint aches Skin: no rashes, no hair loss Neurological: no tremors/no numbness/no tingling/no dizziness  I reviewed pt's medications, allergies, PMH, social hx, family hx, and changes were documented in the history of present illness. Otherwise, unchanged from my initial visit note.  Past Medical History:  Diagnosis Date  . Abnormal pap 1998   Negative Hpv  . Anxiety   . Arthritis   . Cameron lesion, chronic   . Diabetes mellitus   . Dysrhythmia    hx of atrial tachycardia and pacs  . Esophageal reflux   . H/O hiatal hernia   . H/O iron deficiency anemia 08/2016  . Hypercholesteremia   . Hypertension   . Obesity    compulsive overeater  . Osteoarthritis    Past Surgical History:  Procedure Laterality Date  . APPENDECTOMY  1975  . TOTAL KNEE ARTHROPLASTY  01/12/2012   Procedure: TOTAL KNEE ARTHROPLASTY;  Surgeon: Gearlean Alf, MD;  Location: [facility];  Service: Orthopedics;  Laterality: Left;  . TOTAL KNEE ARTHROPLASTY Right 01/10/2019   Procedure: TOTAL KNEE ARTHROPLASTY;  Surgeon: Gaynelle Arabian, MD;  Location: [facility];  Service: Orthopedics;  Laterality: Right;  9mn   Social History   Socioeconomic History  . Marital status: Divorced    Spouse name: Not on file  . Number of children: 1  . Years of education: Not on file  . 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 Smoking status: Never Smoker  . Smokeless tobacco: Never Used  Substance and Sexual Activity  . Alcohol use: Yes    Alcohol/week:  Wine, cocktails    Types: 1-2 Standard drinks or equivalent per week  . Drug use: No  . Sexual activity: Never    Partners: Male    Birth control/protection: Post-menopausal  Social History Narrative   She works as a UHydrologistin tBiomedical scientist   Lives alone.     Highest  level of education:  One year graduate school   Current Outpatient Medications on File Prior to Visit  Medication Sig Dispense Refill  . albuterol (PROAIR HFA) 108 (90 Base) MCG/ACT inhaler Inhale 2 puffs into the lungs every 6 (six) hours as needed for wheezing or shortness of breath.     .Marland Kitchenatorvastatin (LIPITOR) 20 MG tablet Take 20 mg by mouth daily with supper.   0  . Blood Glucose Monitoring Suppl (ONE TOUCH ULTRA 2) w/Device KIT Use to check blood sugar 2-3 times a day 1 each 0  . celecoxib (CELEBREX) 200 MG capsule TAKE 1 CAPSULE BY MOUTH EVERY DAY 30 capsule 1  . diazepam (VALIUM) 5 MG tablet Take 5-10 mg by mouth every 8 (eight) hours as needed for anxiety.    . diphenhydrAMINE (BENADRYL ALLERGY)  25 mg capsule Take 25 mg by mouth every 6 (six) hours as needed for allergies.     . fluticasone (FLONASE) 50 MCG/ACT nasal spray Place 2 sprays into both nostrils daily as needed for allergies.     Marland Kitchen FREESTYLE TEST STRIPS test strip   5  . glipiZIDE (GLUCOTROL XL) 5 MG 24 hr tablet Take 1-2 tablets (5-10 mg total) by mouth daily. 180 tablet 3  . Iron-FA-B Cmp-C-Biot-Probiotic (FUSION PLUS) CAPS Take 1 capsule by mouth daily.     Marland Kitchen losartan-hydrochlorothiazide (HYZAAR) 100-25 MG per tablet Take 1 tablet by mouth every morning.    . metFORMIN (GLUCOPHAGE-XR) 500 MG 24 hr tablet Take 4 tablets (2,000 mg total) by mouth daily with supper. 360 tablet 3  . omeprazole (PRILOSEC) 40 MG capsule Take 40 mg by mouth daily before breakfast.    . OZEMPIC, 1 MG/DOSE, 4 MG/3ML SOPN INJECT 1MG INTO THE SKIN ONCE A WEEK 9 mL 1  . potassium chloride (KLOR-CON) 10 MEQ tablet Take 10 mEq by mouth daily.    . sertraline (ZOLOFT) 50 MG tablet Take 50 mg by mouth daily with supper.    . zolpidem (AMBIEN) 10 MG tablet Take 10 mg by mouth at bedtime as needed for sleep.      No current facility-administered medications on file prior to visit.   Allergies  Allergen Reactions  . Diclofenac Other (See Comments)     GAS  . Glucotrol [Glipizide] Nausea Only  . Lisinopril Cough  . Adhesive [Tape] Rash   Family History  Problem Relation Age of Onset  . Heart attack Father        Deceased, 18  . COPD Father   . Breast cancer Mother 98       Deceased, 28  . Obesity Sister   . Hypertension Brother   . Healthy Son     PE: BP 120/70 (BP Location: Left Arm, Patient Position: Sitting, Cuff Size: Normal)   Pulse 74   Ht '5\' 5"'  (1.651 m)   Wt 248 lb 12.8 oz (112.9 kg)   LMP 06/16/2005   SpO2 94%   BMI 41.40 kg/m  Wt Readings from Last 3 Encounters:  09/19/20 248 lb 12.8 oz (112.9 kg)  05/21/20 248 lb (112.5 kg)  05/16/20 241 lb 12.8 oz (109.7 kg)   Constitutional: overweight, in NAD Eyes: PERRLA, EOMI, no exophthalmos ENT: moist mucous membranes, no thyromegaly, no cervical lymphadenopathy Cardiovascular: RRR, No MRG Respiratory: CTA B Gastrointestinal: abdomen soft, NT, ND, BS+ Musculoskeletal: no deformities, strength intact in all 4 Skin: moist, warm, no rashes Neurological: no tremor with outstretched hands, DTR normal in all 4  ASSESSMENT: 1. DM2, non-insulin-dependent, now more controlled, without long-term complications, but with hyperglycemia  2. HL  3. Obesity class II  PLAN:  1. Patient with longstanding, previously uncontrolled type 2 diabetes, on oral antidiabetic regimen with sulfonylurea and Metformin and also weekly GLP-1 receptor agonist with improved control of her diabetes in the last several years.  Latest HbA1c was reviewed from last visit with PCP 2 months ago and this was 6.4%, only slightly higher than before.  At last visit, sugars appear slightly higher, mostly above target in the morning and also slightly high throughout the day.  We discussed about decreasing the dose of glipizide ER from 5 to 10 mg daily but to back off to 5 mg daily after the holidays.  We did not change the rest of the regimen. -At today's visit, we reviewed her  blood sugar log and sugars appear  to be mostly at goal with few hyperglycemic exceptions.  Upon questioning, she tells me that she did not decrease the dose of glipizide after the holidays and continues at 10 mg daily.  She has no low blood sugars ,but initially she had tremors with morning after taking a higher dose of glipizide.  These have now resolved.  We discussed about trying to decrease the dose to 5 mg daily especially since she increased her exercise since last visit and this may predispose her to lower blood sugars.  We also discussed about the weight inducing effect of glipizide.  I advised her to use the higher dose, 10 mg daily if she has a day in which she is less active or eats more. - I suggested to:  Patient Instructions  Please continue: - Metformin ER 2000 mg after dinner - Ozempic 1 mg weekly  Use: - Glipizide ER 5-10 mg before b'fast  Please return in 4 months with your sugar log  - we checked her HbA1c: 6.1% (lower) - advised to check sugars at different times of the day - 1x a day, rotating check times - advised for yearly eye exams >> she is UTD - return to clinic in 4 months  2. HL -Reviewed latest lipid panel from 07/2020: fractions at goal -She continues on Lipitor 20 mg daily without side effects  3. Obesity class 2  -She was seen at [facility] for binge eating disorder, but not in the last few years -We will continue Ozempic which should also help with weight loss -Try to reduce glipizide dose -She was advised to lose weight by her gastroenterologist before hiatal hernia repair - gained 7 lbs since last OV  Philemon Kingdom, MD PhD [facility] Endocrinology

## 2020-09-19 NOTE — Patient Instructions (Addendum)
Please continue: - Metformin ER 2000 mg after dinner - Ozempic 1 mg weekly  Use: - Glipizide ER 5-10 mg before b'fast  Please return in 4 months with your sugar log

## 2020-09-25 ENCOUNTER — Encounter: Payer: Self-pay | Admitting: Obstetrics and Gynecology

## 2020-09-25 NOTE — Progress Notes (Signed)
65 y.o. G12P1001 Single Caucasian female here for annual exam and chronic pelvic pain. The pain started after her UTIs in 2020.  Her UTI was treated but her symptoms continued.  A maternity belt feels good to reduce the pain.  Pain feels like menstrual cramping. It is a dull ache in her bilateral lower abdomen or midline. The pain is "not great" on a scale of 1 - 10.  Patient feels her weight may be contributing to the pain.  If she holds her lower abdomen or if she stretches her abdomen,  the pain feels better.   Has seen Dr. Matilde Sprang, and had cystoscopy and CT scan 2020.  Did pelvic floor therapy.   Saw Dr. Collene Mares.  Colonoscopy 2018, which was normal. Has hiatal hernia and saw Dr. Greer Pickerel, general surgery.   Saw Dr. Maureen Ralphs for hip evaluation, and told her hips were normal.  Saw Karma Ganja for pain evaluation and tried a course of Celebrex and it did not work.   Has a CT angiogram due to a history of splenic artery aneurysm and thrombosis, first detected on CT scan done at urology office.   She is asking about second opinions for her pain evaluation.   PCP:   London Pepper, MD  Patient's last menstrual period was 06/16/2005.           Sexually active: No.  The current method of family planning is post menopausal status.    Exercising: Yes.    Home exercise routine includes swim,bike,walk. Smoker:  no  Health Maintenance: Pap: 12-04-17 Neg:Neg HR HPV, 02-02-15 Neg:Neg HR HPV, 09-28-12 Neg History of abnormal Pap:  Yes. 1998. MMG:  2022, [facility]  Lt.Br./Rt.Br.neg;Lt.Br.US reveals simple cyst-benign/screening 39yr/BiRads2 Colonoscopy: 11-12-16 f/u 565yrBMD:   NA TDaP: 2017 Gardasil:   no HIV: -- Hep C:12-04-17 Neg Screening Labs:  PCP   reports that she has never smoked. She has never used smokeless tobacco. She reports current alcohol use of about 2.0 standard drinks of alcohol per week. She reports that she does not use drugs.  Past Medical History:  Diagnosis Date  .  Abnormal pap 1998   Negative Hpv  . Anxiety   . Arthritis   . Cameron lesion, chronic   . Diabetes mellitus   . Dysrhythmia    hx of atrial tachycardia and pacs  . Esophageal reflux   . H/O hiatal hernia   . H/O iron deficiency anemia 08/2016  . Hypercholesteremia   . Hypertension   . Obesity    compulsive overeater  . Osteoarthritis     Past Surgical History:  Procedure Laterality Date  . APPENDECTOMY  1975  . TOTAL KNEE ARTHROPLASTY  01/12/2012   Procedure: TOTAL KNEE ARTHROPLASTY;  Surgeon: FrGearlean AlfMD;  Location: [facility];  Service: Orthopedics;  Laterality: Left;  . TOTAL KNEE ARTHROPLASTY Right 01/10/2019   Procedure: TOTAL KNEE ARTHROPLASTY;  Surgeon: AlGaynelle ArabianMD;  Location: [facility];  Service: Orthopedics;  Laterality: Right;  5017m   Current Outpatient Medications  Medication Sig Dispense Refill  . albuterol (VENTOLIN HFA) 108 (90 Base) MCG/ACT inhaler Inhale 2 puffs into the lungs every 6 (six) hours as needed for wheezing or shortness of breath.     . aMarland Kitchenorvastatin (LIPITOR) 20 MG tablet Take 20 mg by mouth daily with supper.   0  . Blood Glucose Monitoring Suppl (ONE TOUCH ULTRA 2) w/Device KIT Use to check blood sugar 2-3 times a day 1 each 0  . celecoxib (  CELEBREX) 200 MG capsule TAKE 1 CAPSULE BY MOUTH EVERY DAY 30 capsule 1  . diazepam (VALIUM) 5 MG tablet Take 5-10 mg by mouth every 8 (eight) hours as needed for anxiety.    . diphenhydrAMINE (BENADRYL) 25 mg capsule Take 25 mg by mouth every 6 (six) hours as needed for allergies.     . fluticasone (FLONASE) 50 MCG/ACT nasal spray Place 2 sprays into both nostrils daily as needed for allergies.     Marland Kitchen FREESTYLE TEST STRIPS test strip   5  . glipiZIDE (GLUCOTROL XL) 5 MG 24 hr tablet Take 1-2 tablets (5-10 mg total) by mouth daily. 180 tablet 3  . Iron-FA-B Cmp-C-Biot-Probiotic (FUSION PLUS) CAPS Take 1 capsule by mouth daily.     Marland Kitchen losartan-hydrochlorothiazide (HYZAAR) 100-25 MG per tablet Take 1 tablet  by mouth every morning.    . metFORMIN (GLUCOPHAGE-XR) 500 MG 24 hr tablet Take 4 tablets (2,000 mg total) by mouth daily with supper. 360 tablet 3  . omeprazole (PRILOSEC) 40 MG capsule Take 40 mg by mouth daily before breakfast.    . OZEMPIC, 1 MG/DOSE, 4 MG/3ML SOPN INJECT $RemoveBef'1MG'oHLCDBiQRG$  INTO THE SKIN ONCE A WEEK 9 mL 3  . potassium chloride (KLOR-CON) 10 MEQ tablet Take 10 mEq by mouth daily.    . sertraline (ZOLOFT) 50 MG tablet Take 50 mg by mouth daily with supper.    . zolpidem (AMBIEN) 10 MG tablet Take 10 mg by mouth at bedtime as needed for sleep.      No current facility-administered medications for this visit.    Family History  Problem Relation Age of Onset  . Heart attack Father        Deceased, 89  . COPD Father   . Breast cancer Mother 84       Deceased, 28  . Obesity Sister   . Hypertension Brother   . Healthy Son     Review of Systems See HPI.   Exam:   BP 128/80 (BP Location: Left Arm, Patient Position: Sitting, Cuff Size: Large)   Ht $R'5\' 5"'Kg$  (1.651 m)   Wt 241 lb (109.3 kg)   LMP 06/16/2005   BMI 40.10 kg/m     General appearance: alert, cooperative and appears stated age Head: normocephalic, without obvious abnormality, atraumatic Neck: no adenopathy, supple, symmetrical, trachea midline and thyroid normal to inspection and palpation Lungs: clear to auscultation bilaterally Breasts: normal appearance, no masses or tenderness, No nipple retraction or dimpling, No nipple discharge or bleeding, No axillary adenopathy Heart: regular rate and rhythm Abdomen: soft, non-tender; no masses, no organomegaly Extremities: extremities normal, atraumatic, no cyanosis or edema Skin: skin color, texture, turgor normal. No rashes or lesions Lymph nodes: cervical, supraclavicular, and axillary nodes normal. Neurologic: grossly normal  Pelvic: External genitalia:  no lesions              No abnormal inguinal nodes palpated.              Urethra:  normal appearing urethra with no  masses, tenderness or lesions              Bartholins and Skenes: normal                 Vagina: Atrophy noted. Tender pelvic floor.               Cervix: no lesions              Pap taken: No. Bimanual Exam:  Uterus:  normal size, contour, position, consistency, mobility, non-tender              Adnexa: no mass, fullness, tenderness              Rectal exam: Yes.  .  Confirms.              Anus:  normal sphincter tone, no lesions  Chaperone was present for exam.  Assessment:   Well woman visit with normal exam. Chronic pelvic pain.  Remote hx abnormal pap and no treatment.  Plan: Mammogram screening discussed. Self breast awareness reviewed. Pap and HR HPV as above. Guidelines for Calcium, Vitamin D, regular exercise program including cardiovascular and weight bearing exercise. Return for pelvic ultrasound.  We discussed treatment options for pelvic pain - medical therapy, pelvic floor therapy, counseling to deal with chronic pain.  I did offer to refer her to the [facility], which she will consider.  Follow up annually and prn.   After visit summary provided.

## 2020-09-26 ENCOUNTER — Encounter: Payer: Self-pay | Admitting: Obstetrics and Gynecology

## 2020-09-26 ENCOUNTER — Ambulatory Visit: Payer: BC Managed Care – PPO | Admitting: Obstetrics and Gynecology

## 2020-09-26 ENCOUNTER — Other Ambulatory Visit: Payer: Self-pay

## 2020-09-26 VITALS — BP 128/80 | Ht 65.0 in | Wt 241.0 lb

## 2020-09-26 DIAGNOSIS — G8929 Other chronic pain: Secondary | ICD-10-CM

## 2020-09-26 DIAGNOSIS — R102 Pelvic and perineal pain: Secondary | ICD-10-CM

## 2020-09-26 DIAGNOSIS — Z01419 Encounter for gynecological examination (general) (routine) without abnormal findings: Secondary | ICD-10-CM

## 2020-09-26 NOTE — Patient Instructions (Signed)

## 2020-10-04 ENCOUNTER — Ambulatory Visit
Admission: RE | Admit: 2020-10-04 | Discharge: 2020-10-04 | Disposition: A | Discharge status: Home / Self Care | Payer: BC Managed Care – PPO | Source: Ambulatory Visit | Attending: Surgery | Admitting: Surgery

## 2020-10-04 DIAGNOSIS — I728 Aneurysm of other specified arteries: Secondary | ICD-10-CM

## 2020-10-04 DIAGNOSIS — R935 Abnormal findings on diagnostic imaging of other abdominal regions, including retroperitoneum: Secondary | ICD-10-CM

## 2020-10-04 MED ORDER — IOPAMIDOL (ISOVUE-370) INJECTION 76%
75.0000 mL | Freq: Once | INTRAVENOUS | Status: AC | PRN
  Administered 2020-10-04: 75 mL via INTRAVENOUS

## 2020-10-08 ENCOUNTER — Other Ambulatory Visit: Payer: Self-pay

## 2020-10-08 ENCOUNTER — Encounter: Payer: Self-pay | Admitting: Surgery

## 2020-10-08 ENCOUNTER — Ambulatory Visit: Payer: BC Managed Care – PPO | Admitting: Surgery

## 2020-10-08 VITALS — BP 133/78 | HR 71 | Temp 97.9°F | Resp 20 | Ht 65.0 in | Wt 250.0 lb

## 2020-10-08 DIAGNOSIS — I728 Aneurysm of other specified arteries: Secondary | ICD-10-CM | POA: Diagnosis not present

## 2020-10-08 NOTE — Progress Notes (Signed)
[facility] [place]  Patient name: Barbara Jackson MRN: [id number] DOB: 08-08-1955 Sex: female   REASON FOR VISIT:    Follow up  HISOTRY OF PRESENT ILLNESS:   Barbara Jackson is a 65 y.o. female, who returns for follow-up of a splenic artery aneurysm which was found incidentally on a CT scan for pelvic pain.  She continues to have lower abdominal pain.  She denies any history of postprandial abdominal pain.  She is a diabetic.  She is a non-smoker.  She takes a statin for hypercholesterolemia.  PAST MEDICAL HISTORY:   Past Medical History:  Diagnosis Date  . Abnormal pap 1998   Negative Hpv  . Anxiety   . Arthritis   . Cameron lesion, chronic   . Diabetes mellitus   . Dysrhythmia    hx of atrial tachycardia and pacs  . Esophageal reflux   . H/O hiatal hernia   . H/O iron deficiency anemia 08/2016  . Hypercholesteremia   . Hypertension   . Obesity    compulsive overeater  . Osteoarthritis      FAMILY HISTORY:   Family History  Problem Relation Age of Onset  . Heart attack Father        Deceased, 39  . COPD Father   . Breast cancer Mother 57       Deceased, 45  . Obesity Sister   . Hypertension Brother   . Healthy Son     SOCIAL HISTORY:   Social History   Tobacco Use  . Smoking status: Never Smoker  . Smokeless tobacco: Never Used  Substance Use Topics  . Alcohol use: Yes    Alcohol/week: 2.0 standard drinks    Types: 2 Standard drinks or equivalent per week     ALLERGIES:   Allergies  Allergen Reactions  . Diclofenac Other (See Comments)    GAS  . Glucotrol [Glipizide] Nausea Only  . Lisinopril Cough  . Adhesive [Tape] Rash     CURRENT MEDICATIONS:   Current Outpatient Medications  Medication Sig Dispense Refill  . albuterol (VENTOLIN HFA) 108 (90 Base) MCG/ACT inhaler Inhale 2 puffs into the lungs every 6 (six) hours as needed for wheezing or shortness of breath.     Marland Kitchen  atorvastatin (LIPITOR) 20 MG tablet Take 20 mg by mouth daily with supper.   0  . Blood Glucose Monitoring Suppl (ONE TOUCH ULTRA 2) w/Device KIT Use to check blood sugar 2-3 times a day 1 each 0  . celecoxib (CELEBREX) 200 MG capsule TAKE 1 CAPSULE BY MOUTH EVERY DAY 30 capsule 1  . diazepam (VALIUM) 5 MG tablet Take 5-10 mg by mouth every 8 (eight) hours as needed for anxiety.    . diphenhydrAMINE (BENADRYL) 25 mg capsule Take 25 mg by mouth every 6 (six) hours as needed for allergies.     . fluticasone (FLONASE) 50 MCG/ACT nasal spray Place 2 sprays into both nostrils daily as needed for allergies.     Marland Kitchen FREESTYLE TEST STRIPS test strip   5  . glipiZIDE (GLUCOTROL XL) 5 MG 24 hr tablet Take 1-2 tablets (5-10 mg total) by mouth daily. 180 tablet 3  . Iron-FA-B Cmp-C-Biot-Probiotic (FUSION PLUS) CAPS Take 1 capsule by mouth daily.     Marland Kitchen losartan-hydrochlorothiazide (HYZAAR) 100-25 MG per tablet Take 1 tablet by mouth every morning.    . metFORMIN (GLUCOPHAGE-XR) 500 MG 24 hr tablet Take 4 tablets (2,000 mg total) by mouth daily with supper. [place]  tablet 3  . omeprazole (PRILOSEC) 40 MG capsule Take 40 mg by mouth daily before breakfast.    . OZEMPIC, 1 MG/DOSE, 4 MG/3ML SOPN INJECT 1MG INTO THE SKIN ONCE A WEEK 9 mL 3  . potassium chloride (KLOR-CON) 10 MEQ tablet Take 10 mEq by mouth daily.    . sertraline (ZOLOFT) 50 MG tablet Take 50 mg by mouth daily with supper.    . zolpidem (AMBIEN) 10 MG tablet Take 10 mg by mouth at bedtime as needed for sleep.      No current facility-administered medications for this visit.    REVIEW OF SYSTEMS:   '[X]'  denotes positive finding, '[ ]'  denotes negative finding Cardiac  Comments:  Chest pain or chest pressure:    Shortness of breath upon exertion:    Short of breath when lying flat:    Irregular heart rhythm:        Vascular    Pain in calf, thigh, or hip brought on by ambulation:    Pain in feet at night that wakes you up from your sleep:      Blood clot in your veins:    Leg swelling:         Pulmonary    Oxygen at home:    Productive cough:     Wheezing:         Neurologic    Sudden weakness in arms or legs:     Sudden numbness in arms or legs:     Sudden onset of difficulty speaking or slurred speech:    Temporary loss of vision in one eye:     Problems with dizziness:         Gastrointestinal    Blood in stool:     Vomited blood:         Genitourinary    Burning when urinating:     Blood in urine:        Psychiatric    Major depression:         Hematologic    Bleeding problems:    Problems with blood clotting too easily:        Skin    Rashes or ulcers:        Constitutional    Fever or chills:      PHYSICAL EXAM:   Vitals:   10/08/20 1024  BP: 133/78  Pulse: 71  Resp: 20  Temp: 97.9 F (36.6 C)  SpO2: 96%  Weight: 250 lb (113.4 kg)  Height: '5\' 5"'  (1.651 m)    GENERAL: The patient is a well-nourished female, in no acute distress. The vital signs are documented above. CARDIAC: There is a regular rate and rhythm.  PULMONARY: Non-labored respirations MUSCULOSKELETAL: There are no major deformities or cyanosis. NEUROLOGIC: No focal weakness or paresthesias are detected. SKIN: There are no ulcers or rashes noted. PSYCHIATRIC: The patient has a normal affect.  STUDIES:   I have reviewed her CT scan with the following findings: 1. Thrombosed saccular splenic artery aneurysm. This thrombosed aneurysm is unchanged in size measuring 1.8 cm. 2. Large hiatal hernia. 3. No acute abnormality in the abdomen or pelvis. MEDICAL ISSUES:   Splenic aneurysm: The diameter is unchanged.  The aneurysm is thrombosed on her scan.  We discussed that there is no role for surgical intervention.  I would like to reevaluate the size of her aneurysm in 2 years with a repeat CT scan.  Incidentally, she does not have any atherosclerotic vascular changes in her  abdominal aorta, including her mesenteric  vessels.    Leia Alf, MD, FACS [facility] [phone] Pager [phone]

## 2020-10-30 NOTE — Progress Notes (Signed)
GYNECOLOGY  VISIT   HPI: 65 y.o.   Single  Caucasian  female   [id number] with Patient's last menstrual period was 06/16/2005.   here for pelvic ultrasound for menstrual like cramping in her bilateral lower abdomen.   She has a history of chronic pelvic pain and has seen specialists from urology, gastroenterology, gynecology, and orthopedic medicine.  No prior pelvic US.   States no symptoms for 3 weeks.  No change in her routine - has not added or subtracted anything.   GYNECOLOGIC HISTORY: Patient's last menstrual period was 06/16/2005. Contraception:  PMP Menopausal hormone therapy:  none Last mammogram:  06-26-20 3D/Neg/BiRads1 Last pap smear:  12-04-17 Neg:Neg HR HPV, 02-02-15 Neg:Neg HR HPV, 09-28-12 Neg         OB History    Gravida  1   Para  1   Term  1   Preterm      AB      Living  1     SAB      IAB      Ectopic      Multiple      Live Births  1              Patient Active Problem List   Diagnosis Date Noted  . Pain in right hand 11/23/2019  . Pain of left hand 11/23/2019  . Cameron lesion, chronic   . Aftercare 02/15/2019  . History of total knee replacement, right 02/15/2019  . Stiffness of right knee 01/17/2019  . Osteoarthritis of right knee 01/10/2019  . Obesity with serious comorbidity 09/16/2018  . Hyperlipidemia 08/17/2018  . History of total left knee replacement 08/05/2017  . Open angle with borderline findings and low glaucoma risk in both eyes 04/27/2017  . Obstructive sleep apnea 07/08/2016  . Hypertension   . Type 2 diabetes mellitus with hyperglycemia, without long-term current use of insulin ([place])   . Primary osteoarthritis of right knee 01/12/2012    Past Medical History:  Diagnosis Date  . Abnormal pap 1998   Negative Hpv  . Anxiety   . Arthritis   . Cameron lesion, chronic   . Diabetes mellitus   . Dysrhythmia    hx of atrial tachycardia and pacs  . Esophageal reflux   . H/O hiatal hernia   . H/O iron deficiency  anemia 08/2016  . Hypercholesteremia   . Hypertension   . Obesity    compulsive overeater  . Osteoarthritis     Past Surgical History:  Procedure Laterality Date  . APPENDECTOMY  1975  . TOTAL KNEE ARTHROPLASTY  01/12/2012   Procedure: TOTAL KNEE ARTHROPLASTY;  Surgeon: Gearlean Alf, MD;  Location: [facility];  Service: Orthopedics;  Laterality: Left;  . TOTAL KNEE ARTHROPLASTY Right 01/10/2019   Procedure: TOTAL KNEE ARTHROPLASTY;  Surgeon: Gaynelle Arabian, MD;  Location: [facility];  Service: Orthopedics;  Laterality: Right;  36mn    Current Outpatient Medications  Medication Sig Dispense Refill  . albuterol (VENTOLIN HFA) 108 (90 Base) MCG/ACT inhaler Inhale 2 puffs into the lungs every 6 (six) hours as needed for wheezing or shortness of breath.     .Marland Kitchenatorvastatin (LIPITOR) 20 MG tablet Take 20 mg by mouth daily with supper.   0  . Blood Glucose Monitoring Suppl (ONE TOUCH ULTRA 2) w/Device KIT Use to check blood sugar 2-3 times a day 1 each 0  . celecoxib (CELEBREX) 200 MG capsule TAKE 1 CAPSULE BY MOUTH EVERY DAY 30 capsule 1  .  diazepam (VALIUM) 5 MG tablet Take 5-10 mg by mouth every 8 (eight) hours as needed for anxiety.    . diphenhydrAMINE (BENADRYL) 25 mg capsule Take 25 mg by mouth every 6 (six) hours as needed for allergies.     . fluticasone (FLONASE) 50 MCG/ACT nasal spray Place 2 sprays into both nostrils daily as needed for allergies.     Marland Kitchen FREESTYLE TEST STRIPS test strip   5  . Iron-FA-B Cmp-C-Biot-Probiotic (FUSION PLUS) CAPS Take 1 capsule by mouth daily.     Marland Kitchen losartan-hydrochlorothiazide (HYZAAR) 100-25 MG per tablet Take 1 tablet by mouth every morning.    . metFORMIN (GLUCOPHAGE-XR) 500 MG 24 hr tablet Take 4 tablets (2,000 mg total) by mouth daily with supper. 360 tablet 3  . omeprazole (PRILOSEC) 40 MG capsule Take 40 mg by mouth daily before breakfast.    . OZEMPIC, 1 MG/DOSE, 4 MG/3ML SOPN INJECT 1MG INTO THE SKIN ONCE A WEEK 9 mL 3  . potassium chloride  (KLOR-CON) 10 MEQ tablet Take 10 mEq by mouth daily.    . sertraline (ZOLOFT) 50 MG tablet Take 50 mg by mouth daily with supper.    . zolpidem (AMBIEN) 10 MG tablet Take 10 mg by mouth at bedtime as needed for sleep.      No current facility-administered medications for this visit.     ALLERGIES: Diclofenac, Glucotrol [glipizide], Lisinopril, and Adhesive [tape]  Family History  Problem Relation Age of Onset  . Heart attack Father        Deceased, 58  . COPD Father   . Breast cancer Mother 34       Deceased, 68  . Obesity Sister   . Hypertension Brother   . Healthy Son     Social History   Socioeconomic History  . Marital status: Single    Spouse name: Not on file  . Number of children: 1  . Years of education: Not on file  . Highest education level: Not on file  Occupational History    Employer: [employer]  Tobacco Use  . Smoking status: Never Smoker  . Smokeless tobacco: Never Used  Vaping Use  . Vaping Use: Never used  Substance and Sexual Activity  . Alcohol use: Yes    Alcohol/week: 2.0 standard drinks    Types: 2 Standard drinks or equivalent per week  . Drug use: No  . Sexual activity: Not Currently    Partners: Male    Birth control/protection: Post-menopausal  Other Topics Concern  . 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   Lives alone.     Highest level of education:  One year graduate school   Social Determinants of Health   Financial Resource Strain: Not on file  Food Insecurity: Not on file  Transportation Needs: Not on file  Physical Activity: Not on file  Stress: Not on file  Social Connections: Not on file  Intimate Partner Violence: Not on file    Review of Systems  All other systems reviewed and are negative.   PHYSICAL EXAMINATION:    BP 128/76 (Cuff Size: Large)   Pulse 66   Ht '5\' 5"'  (1.651 m)   Wt 241 lb (109.3 kg)   LMP 06/16/2005   SpO2 97%   BMI 40.10 kg/m     General  appearance: alert, cooperative and appears stated age   Pelvic US Uterus 4.28 x 3.16 x 2.76 cm.  No myometrial masses. EMS 3.41 mm.  Ovaries normal.  No adnexal masses.  No free fluid.   ASSESSMENT  Chronic pelvic pain, resolved.   PLAN  Pelvic ultrasound images and report reviewed with patient.  Reassurance regarding normal appearance.  She will let me know if her pain recurs.  If it does, I will refer her to the [facility] in [place]. Fu prn.   21 min  total time was spent for this patient encounter, including preparation, face-to-face counseling with the patient, coordination of care, and documentation of the encounter.

## 2020-11-01 ENCOUNTER — Other Ambulatory Visit: Payer: BC Managed Care – PPO

## 2020-11-01 ENCOUNTER — Encounter: Payer: Self-pay | Admitting: Obstetrics and Gynecology

## 2020-11-01 ENCOUNTER — Ambulatory Visit (INDEPENDENT_AMBULATORY_CARE_PROVIDER_SITE_OTHER): Payer: BC Managed Care – PPO

## 2020-11-01 ENCOUNTER — Other Ambulatory Visit: Payer: Self-pay

## 2020-11-01 ENCOUNTER — Other Ambulatory Visit: Payer: BC Managed Care – PPO | Admitting: Obstetrics and Gynecology

## 2020-11-01 ENCOUNTER — Ambulatory Visit: Payer: BC Managed Care – PPO | Admitting: Obstetrics and Gynecology

## 2020-11-01 VITALS — BP 128/76 | HR 66 | Ht 65.0 in | Wt 241.0 lb

## 2020-11-01 DIAGNOSIS — R102 Pelvic and perineal pain: Secondary | ICD-10-CM

## 2020-11-01 DIAGNOSIS — G8929 Other chronic pain: Secondary | ICD-10-CM

## 2020-11-02 ENCOUNTER — Encounter: Payer: Self-pay | Admitting: Obstetrics and Gynecology

## 2020-11-19 ENCOUNTER — Encounter: Payer: Self-pay | Admitting: Internal Medicine

## 2021-02-05 ENCOUNTER — Ambulatory Visit: Payer: BC Managed Care – PPO | Admitting: Internal Medicine

## 2021-02-11 ENCOUNTER — Encounter: Payer: Self-pay | Admitting: Internal Medicine

## 2021-02-11 ENCOUNTER — Ambulatory Visit: Payer: BC Managed Care – PPO | Admitting: Internal Medicine

## 2021-02-11 ENCOUNTER — Other Ambulatory Visit: Payer: Self-pay

## 2021-02-11 VITALS — BP 128/80 | HR 92 | Ht 65.0 in | Wt 247.8 lb

## 2021-02-11 DIAGNOSIS — E785 Hyperlipidemia, unspecified: Secondary | ICD-10-CM | POA: Diagnosis not present

## 2021-02-11 DIAGNOSIS — E669 Obesity, unspecified: Secondary | ICD-10-CM

## 2021-02-11 DIAGNOSIS — E1165 Type 2 diabetes mellitus with hyperglycemia: Secondary | ICD-10-CM

## 2021-02-11 LAB — POCT GLYCOSYLATED HEMOGLOBIN (HGB A1C): Hemoglobin A1C: 6.4 % — AB (ref 4.0–5.6)

## 2021-02-11 MED ORDER — GLIPIZIDE ER 5 MG PO TB24: ORAL_TABLET | ORAL | 3 refills | Status: DC

## 2021-02-11 NOTE — Patient Instructions (Signed)
Please continue: - Metformin ER 2000 mg after dinner - Glipizide ER 5 mg before b'fast - Ozempic 1 mg weekly  Add 5 mg Glipizide ER crushed before a larger meal or if you have dessert  Please return in 4 months with your sugar log

## 2021-02-11 NOTE — Progress Notes (Signed)
Patient ID: Barbara Jackson, female   DOB: [birth year], 65 y.o.   MRN: [id number]   This visit occurred during the SARS-CoV-2 public health emergency.  Safety protocols were in place, including screening questions prior to the visit, additional usage of staff PPE, and extensive cleaning of exam room while observing appropriate contact time as indicated for disinfecting solutions.   HPI: Barbara Jackson is a 65 y.o.-year-old female, returning for follow-up for DM2, dx in 2007, non-insulin-dependent, now more controlled, without long-term complications.  Last visit 4.5 months ago.  Interim history: Before last visit, she started to exercise on her recumbent bike.  She also continues swimming and walking. No increased urination, blurry vision, nausea, chest pain.  Reviewed HbA1c levels: 09/19/2020: HbA1c 6.1% 07/25/2020: HbA1c 6.4% Lab Results  Component Value Date   HGBA1C 6.3 (A) 05/16/2020   HGBA1C 6.1 (A) 08/23/2019   HGBA1C 6.4 (A) 04/25/2019   HGBA1C 6.2 (A) 12/20/2018   HGBA1C 6.5 (A) 08/17/2018   HGBA1C 6.1 (A) 05/03/2018  11/25/2019: HbA1c 6.6% 02/22/2018: HbA1c 7.0% 07/2017: HbA1c 7.7% 07/10/2016: HbA1c 7.0%  Pt is on a regimen of: - Metformin ER 2000 mg after dinner - Glipizide XL 10 >> 5 mg before breakfast - Ozempic 0.5 >> 1 mg weekly in a.m. We stopped Actos 08/17/2018. She tried Januvia, but stopped last year when she was on a new diet at that time and did not want to make any changes in her medicines.  Pt checks her sugars  0 to once a day per review of her log: - am: 119-134 >> 127-154, 159 >> 113-135, 148, 153 >> 126-150, 155 (forgot meds) - 2h after b'fast: 94-134, 157 >> 129-169 >> 124-147, 167 >> 120, 126 - before lunch: 89-109 >> 110-141, 160 (snack) >> 102-120 >> 127, 130 - 2h after lunch:  147-189 >> 135--165, 190 >> 103-177 >> 134-181 - before dinner: 98-136 >> 105, 116-162, 180 >> 82-134, 178 >> 92-145, 165 - 2h after dinner: 1 126, 134, 230 >> 107-143, 180 >>  113-164 >> 141-196 (dessert) - bedtime: 121, 133, 180 >> 110-154, 171 >> 137-151 >> 130-166 (dessert) - nighttime: n/c >> 120, 133 >> n/c Lowest sugar was  99 >> 89 >> 107 >> 92; she has hypoglycemia awareness in the 70s. Highest sugar was  238 (dessert) >> 230 >> 190 >> 196 (dessert).  Glucometer: One Touch Ultra 2  In the past, she read Dr. Janene Harvey program for reversing diabetes program started to adopt the changes suggested in the log.  Her sugars started to improve significantly.   She also saw [name] with nutrition in the past.  She was being seen in the binge eating clinic at [facility].  However, she was not seen in the last 2 years.  -No CKD, last BUN/creatinine:  07/25/2020:17/0.8 11/25/2019: 23/0.89, GFR 64 Lab Results  Component Value Date   BUN 14 01/12/2019   BUN 15 01/11/2019   CREATININE 0.76 01/12/2019   CREATININE 0.90 01/11/2019  02/22/2018: 17/0.88, GFR 65, glucose 136, with the rest of the CMP normal; ACR 6.4 07/10/2016: 20/0.83 On losartan 100.  -+ HL;  last set of lipids: 07/25/2020: 145/161/83/38 06/12/2019: 170/137/76/71 02/22/2018: 139/109/71/46 07/10/2016: 164/122/84/32 No results found for: CHOL, HDL, LDLCALC, LDLDIRECT, TRIG, CHOLHDL  On Lipitor 40.  - last eye exam was in 10/2020: No DR; glaucoma suspect [facility], Dr. Gershon Crane).   - no numbness and tingling in her feet.  Pt has FH of DM in [place].  She is also on Cymbalta for depression.   She also has a history of HTN.   She also has a history of frequent UTIs and pelvic floor pain.  She was on trimethoprim chronically.  She saw several specialists but no clear diagnosis was made.  Symptoms resolved ~10/2020. She is on iron -it consistently.  ROS: Constitutional: no weight gain/no weight loss, no fatigue, no subjective hyperthermia, no subjective hypothermia Eyes: no blurry vision, no xerophthalmia ENT: no sore throat, no nodules palpated in neck, no dysphagia, no odynophagia, no  hoarseness Cardiovascular: no CP/no SOB/no palpitations/no leg swelling Respiratory: no cough/no SOB/no wheezing Gastrointestinal: no N/no V/no D/no C/no acid reflux Musculoskeletal: no muscle aches/no joint aches Skin: no rashes, no hair loss Neurological: no tremors/no numbness/no tingling/no dizziness  I reviewed pt's medications, allergies, PMH, social hx, family hx, and changes were documented in the history of present illness. Otherwise, unchanged from my initial visit note.  Past Medical History:  Diagnosis Date   Abnormal pap 1998   Negative Hpv   Anxiety    Arthritis    [name] lesion, chronic    Diabetes mellitus    Dysrhythmia    hx of atrial tachycardia and pacs   Esophageal reflux    H/O hiatal hernia    H/O iron deficiency anemia 08/2016   Hypercholesteremia    Hypertension    Obesity    compulsive overeater   Osteoarthritis    Past Surgical History:  Procedure Laterality Date   APPENDECTOMY  1975   TOTAL KNEE ARTHROPLASTY  01/12/2012   Procedure: TOTAL KNEE ARTHROPLASTY;  Surgeon: Gearlean Alf, MD;  Location: [facility];  Service: Orthopedics;  Laterality: Left;   TOTAL KNEE ARTHROPLASTY Right 01/10/2019   Procedure: TOTAL KNEE ARTHROPLASTY;  Surgeon: Gaynelle Arabian, MD;  Location: [facility];  Service: Orthopedics;  Laterality: Right;  83mn   Social History   Socioeconomic History   Marital status: Divorced    Spouse name: Not on file   Number of children: 1   Years of education: Not on file   Highest education level: Not on file  Occupational History    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     Highest level of education:  One year graduate school   Current Outpatient Medications on File Prior to Visit  Medication Sig Dispense Refill   albuterol (VENTOLIN HFA) 108 (90 Base) MCG/ACT inhaler Inhale 2 puffs into the lungs every 6 (six) hours as needed for wheezing or shortness of breath.      atorvastatin (LIPITOR) 20 MG tablet Take 20 mg by mouth daily with supper.   0   Blood Glucose Monitoring Suppl (ONE TOUCH ULTRA 2) w/Device KIT Use to check blood sugar 2-3 times a day 1 each 0   celecoxib (CELEBREX) 200 MG capsule TAKE 1 CAPSULE BY MOUTH EVERY DAY 30 capsule 1   diazepam (VALIUM) 5 MG tablet Take 5-10 mg by mouth every 8 (eight) hours as needed for anxiety.  diphenhydrAMINE (BENADRYL) 25 mg capsule Take 25 mg by mouth every 6 (six) hours as needed for allergies.      fluticasone (FLONASE) 50 MCG/ACT nasal spray Place 2 sprays into both nostrils daily as needed for allergies.      FREESTYLE TEST STRIPS test strip   5   Iron-FA-B Cmp-C-Biot-Probiotic (FUSION PLUS) CAPS Take 1 capsule by mouth daily.      losartan-hydrochlorothiazide (HYZAAR) 100-25 MG per tablet Take 1 tablet by mouth every morning.     metFORMIN (GLUCOPHAGE-XR) 500 MG 24 hr tablet Take 4 tablets (2,000 mg total) by mouth daily with supper. 360 tablet 3   omeprazole (PRILOSEC) 40 MG capsule Take 40 mg by mouth daily before breakfast.     OZEMPIC, 1 MG/DOSE, 4 MG/3ML SOPN INJECT 1MG INTO THE SKIN ONCE A WEEK 9 mL 3   potassium chloride (KLOR-CON) 10 MEQ tablet Take 10 mEq by mouth daily.     sertraline (ZOLOFT) 50 MG tablet Take 50 mg by mouth daily with supper.     zolpidem (AMBIEN) 10 MG tablet Take 10 mg by mouth at bedtime as needed for sleep.      No current facility-administered medications on file prior to visit.   Allergies  Allergen Reactions   Diclofenac Other (See Comments)    GAS   Glucotrol [Glipizide] Nausea Only   Lisinopril Cough    Adhesive [Tape] Rash   Family History  Problem Relation Age of Onset   Heart attack Father        Deceased, 29   COPD Father    Breast cancer Mother 72       Deceased, 81   Obesity Sister    Hypertension Brother    Healthy Son     PE: BP 128/80 (BP Location: Right Arm, Patient Position: Sitting, Cuff Size: Normal)   Pulse 92   Ht '5\' 5"'  (1.651 m)   Wt 247 lb 12.8 oz (112.4 kg)   LMP 06/16/2005   SpO2 95%   BMI 41.24 kg/m  Wt Readings from Last 3 Encounters:  02/11/21 247 lb 12.8 oz (112.4 kg)  11/01/20 241 lb (109.3 kg)  10/08/20 250 lb (113.4 kg)   Constitutional: overweight, in NAD Eyes: PERRLA, EOMI, no exophthalmos ENT: moist mucous membranes, no thyromegaly, no cervical lymphadenopathy Cardiovascular: RRR, No MRG Respiratory: CTA B Gastrointestinal: abdomen soft, NT, ND, BS+ Musculoskeletal: no deformities, strength intact in all 4 Skin: moist, warm, no rashes Neurological: no tremor with outstretched hands, DTR normal in all 4  ASSESSMENT: 1. DM2, non-insulin-dependent, now more controlled, without long-term complications, but with hyperglycemia  2. HL  3. Obesity class II  PLAN:  1. Patient with longstanding, previously uncontrolled type 2 diabetes, with improved control in the last 3 years.  She is on metformin extended release, sulfonylurea, and weekly GLP-1 receptor agonist, with improved HbA1c at last visit: 6.1%. -At last visit, sugars are mostly at goal with few hypoglycemic exceptions in the setting of increased exercise >> we decreased the dose of glipizide.  We continued metformin and Ozempic. -At today's visit, she has an occasional higher blood sugar after a larger meal or if she forgot medication the previous night.  We discussed about adding a crash glipizide ER before a larger meal or if she has dessert, but otherwise, we can continue the current regimen.  She tolerates Ozempic well.  At next visit, we can consider increasing the dose to 2 mg  weekly, but this dose is on backorder  now. - I suggested to:  Patient Instructions  Please continue: - Metformin ER 2000 mg after dinner - Glipizide ER 5 mg before b'fast - Ozempic 1 mg weekly  Add 5 mg Glipizide ER crushed before a larger meal or if you have dessert  Please return in 4 months with your sugar log   - we checked her HbA1c: 6.4% (higher) - advised to check sugars at different times of the day - 1x a day, rotating check times - advised for yearly eye exams >> she is UTD - return to clinic in 4 months  2. HL -Reviewed latest lipid panel from 07/2020: Fractions at goal -She continues Lipitor 20 mg daily without side effects  3. Obesity class 2  -She was seen at [facility] for binge eating disorder, but not in the last few years -We will continue Ozempic which should also help with weight loss and reducing cravings -Before last visit, she gained 7 pounds and she gained 6 more since then  Philemon Kingdom, MD PhD [facility]

## 2021-02-11 NOTE — Addendum Note (Signed)
Addended by: Lauralyn Primes on: 02/11/2021 03:23 PM   Modules accepted: Orders

## 2021-03-08 ENCOUNTER — Ambulatory Visit: Payer: BC Managed Care – PPO | Admitting: Podiatry

## 2021-03-08 ENCOUNTER — Other Ambulatory Visit: Payer: Self-pay

## 2021-03-08 ENCOUNTER — Encounter: Payer: Self-pay | Admitting: Podiatry

## 2021-03-08 DIAGNOSIS — B351 Tinea unguium: Secondary | ICD-10-CM

## 2021-03-08 MED ORDER — TERBINAFINE HCL 250 MG PO TABS: ORAL_TABLET | ORAL | 0 refills | Status: DC

## 2021-03-08 NOTE — Progress Notes (Signed)
Subjective:   Patient ID: Barbara Jackson, female   DOB: 65 y.o.   MRN: [id number]   HPI Patient presents stating that she has developed discoloration of her right big toenail slight in her left big toenail that she is concerned about and is here also for diabetic foot exam with no current symptoms related to diabetes and A1c in acceptable range   ROS      Objective:  Physical Exam  Neurovascular status was found to be intact muscle strength was found to be adequate range of motion at subtalar and midtarsal joint with patient noted to have yellow discoloration of the nail bed right over left distal two thirds right several months duration     Assessment:  Doing well overall from a diabetic standpoint with mycotic nail infection     Plan:  H&P reviewed condition and I have recommended a pulse Lamisil 1 pill a day for 7 days for 4 months along with several laser therapies and topical medicine.  Educated her on diabetes and what to watch for with daily inspections of her feet

## 2021-04-01 ENCOUNTER — Other Ambulatory Visit: Payer: BC Managed Care – PPO

## 2021-05-28 ENCOUNTER — Other Ambulatory Visit: Payer: Self-pay | Admitting: Internal Medicine

## 2021-08-13 ENCOUNTER — Encounter: Payer: Self-pay | Admitting: Internal Medicine

## 2021-08-13 ENCOUNTER — Ambulatory Visit (INDEPENDENT_AMBULATORY_CARE_PROVIDER_SITE_OTHER): Payer: BC Managed Care – PPO | Admitting: Internal Medicine

## 2021-08-13 ENCOUNTER — Other Ambulatory Visit: Payer: Self-pay

## 2021-08-13 VITALS — BP 130/80 | HR 64 | Ht 65.0 in | Wt 250.4 lb

## 2021-08-13 DIAGNOSIS — E669 Obesity, unspecified: Secondary | ICD-10-CM

## 2021-08-13 DIAGNOSIS — E1165 Type 2 diabetes mellitus with hyperglycemia: Secondary | ICD-10-CM

## 2021-08-13 DIAGNOSIS — E785 Hyperlipidemia, unspecified: Secondary | ICD-10-CM

## 2021-08-13 LAB — COMPREHENSIVE METABOLIC PANEL
ALT: 19 U/L (ref 0–35)
AST: 17 U/L (ref 0–37)
Albumin: 4.5 g/dL (ref 3.5–5.2)
Alkaline Phosphatase: 60 U/L (ref 39–117)
BUN: 19 mg/dL (ref 6–23)
CO2: 27 mEq/L (ref 19–32)
Calcium: 9.7 mg/dL (ref 8.4–10.5)
Chloride: 101 mEq/L (ref 96–112)
Creatinine, Ser: 0.8 mg/dL (ref 0.40–1.20)
GFR: 77.27 mL/min (ref >60.00)
Glucose, Bld: 134 mg/dL — ABNORMAL HIGH (ref 70–99)
Potassium: 4 mEq/L (ref 3.5–5.1)
Sodium: 139 mEq/L (ref 135–145)
Total Bilirubin: 0.5 mg/dL (ref 0.2–1.2)
Total Protein: 7.1 g/dL (ref 6.0–8.3)

## 2021-08-13 LAB — LIPID PANEL
Cholesterol: 174 mg/dL (ref 0–200)
HDL: 84.6 mg/dL (ref >39.00)
NonHDL: 89.38
Total CHOL/HDL Ratio: 2
Triglycerides: 217 mg/dL — ABNORMAL HIGH (ref 0.0–149.0)
VLDL: 43.4 mg/dL — ABNORMAL HIGH (ref 0.0–40.0)

## 2021-08-13 LAB — MICROALBUMIN / CREATININE URINE RATIO
Creatinine,U: 157.4 mg/dL
Microalb Creat Ratio: 0.5 mg/g (ref 0.0–30.0)
Microalb, Ur: 0.8 mg/dL (ref 0.0–1.9)

## 2021-08-13 LAB — LDL CHOLESTEROL, DIRECT: Direct LDL: 33 mg/dL

## 2021-08-13 MED ORDER — SEMAGLUTIDE (2 MG/DOSE) 8 MG/3ML ~~LOC~~ SOPN: 2.0000 mg | PEN_INJECTOR | SUBCUTANEOUS | 3 refills | Status: DC

## 2021-08-13 NOTE — Progress Notes (Signed)
Patient ID: Barbara Jackson, female   DOB: Apr 08, 1956, 66 y.o.   MRN: [id number]   This visit occurred during the SARS-CoV-2 public health emergency.  Safety protocols were in place, including screening questions prior to the visit, additional usage of staff PPE, and extensive cleaning of exam room while observing appropriate contact time as indicated for disinfecting solutions.   HPI: Barbara Jackson is a 66 y.o.-year-old female, returning for follow-up for DM2, dx in 2007, non-insulin-dependent, now more controlled, without long-term complications.  Last visit 6 months ago.  Interim history: No increased urination, blurry vision, nausea, chest pain. She was laid off 03/2021. She will retire in 10/2021.  Reviewed HbA1c levels: Lab Results  Component Value Date   HGBA1C 6.4 (A) 02/11/2021   HGBA1C 6.1 (A) 09/19/2020   HGBA1C 6.3 (A) 05/16/2020   HGBA1C 6.1 (A) 08/23/2019   HGBA1C 6.4 (A) 04/25/2019   HGBA1C 6.2 (A) 12/20/2018   HGBA1C 6.5 (A) 08/17/2018   HGBA1C 6.1 (A) 05/03/2018  09/19/2020: HbA1c 6.1% 07/25/2020: HbA1c 6.4% 11/25/2019: HbA1c 6.6% 02/22/2018: HbA1c 7.0% 07/2017: HbA1c 7.7% 07/10/2016: HbA1c 7.0%  Pt is on a regimen of: - Metformin ER 2000 mg after dinner - Glipizide XL 10 >> 5-10 mg before breakfast - Ozempic 0.5 >> 1 mg weekly in a.m. We stopped Actos 08/17/2018. She tried Januvia, but stopped last year when she was on a new diet at that time and did not want to make any changes in her medicines.  Pt checks her sugars  0 to once a day per review of her log: - am:  113-135, 148, 153 >> 126-150, 155 (forgot meds) >> 120-144, 153 - 2h after b'fast: 129-169 >> 124-147, 167 >> 120, 126 >> 112-151, 196 - before lunch:110-141, 160 (snack) >> 102-120 >> 127, 130 >> 101-145, 163 - 2h after lunch:   135--165, 190 >> 103-177 >> 134-181 >> 117-147 - before dinner:  82-134, 178 >> 92-145, 165 >> 80-160, 243 - 2h after dinner: 107-143, 180 >> 113-164 >> 141-196 >> 125-200 -  bedtime: 110-154, 171 >> 137-151 >> 130-166 (dessert) >> 144-190 - nighttime: n/c >> 120, 133 >> n/c Lowest sugar was  99 >> 89 >> 107 >> 92 >> 80; she has hypoglycemia awareness in the 70s. Highest sugar was  238 (dessert) >> 230 >> 190 >> 196 (dessert) >> 200  Glucometer: One Touch Ultra 2  In the past, she read Dr. Janene Harvey program for reversing diabetes program started to adopt the changes suggested in the log.  Her sugars started to improve significantly.   She also saw [name] with nutrition in the past.  She was being seen in the binge eating clinic at [facility].  However, she was not seen in the last 2 years.  -No CKD, last BUN/creatinine:  07/25/2020:17/0.8 11/25/2019: 23/0.89, GFR 64 Lab Results  Component Value Date   BUN 14 01/12/2019   BUN 15 01/11/2019   CREATININE 0.76 01/12/2019   CREATININE 0.90 01/11/2019  02/22/2018: 17/0.88, GFR 65, glucose 136, with the rest of the CMP normal; ACR 6.4 07/10/2016: 20/0.83 On losartan 100.  -+ HL;  last set of lipids: 07/25/2020: 145/161/83/38 06/12/2019: 170/137/76/71 02/22/2018: 139/109/71/46 07/10/2016: 164/122/84/32 No results found for: CHOL, HDL, LDLCALC, LDLDIRECT, TRIG, CHOLHDL  On Lipitor 40.  - last eye exam was in 10/2020: No DR; glaucoma suspect [facility], Dr. Gershon Crane).   - no numbness and tingling in her feet.  She sees podiatry-Dr. Paulla Dolly.  Last visit 03/08/2021.  Pt has FH of DM in MGM.  She also has depression-on Cymbalta, HTN, frequent UTIs and pelvic floor pain.  Previously on treatment of pain chronically. She saw several specialists but no clear diagnosis was made.  Symptoms resolved ~10/2020. She is on iron -it consistently.  ROS: + see HPI  I reviewed pt's medications, allergies, PMH, social hx, family hx, and changes were documented in the history of present illness. Otherwise, unchanged from my initial visit note.  Past Medical History:  Diagnosis Date   Abnormal pap 1998   Negative Hpv    Anxiety    Arthritis    [name] lesion, chronic    Diabetes mellitus    Dysrhythmia    hx of atrial tachycardia and pacs   Esophageal reflux    H/O hiatal hernia    H/O iron deficiency anemia 08/2016   Hypercholesteremia    Hypertension    Obesity    compulsive overeater   Osteoarthritis    Past Surgical History:  Procedure Laterality Date   APPENDECTOMY  1975   TOTAL KNEE ARTHROPLASTY  01/12/2012   Procedure: TOTAL KNEE ARTHROPLASTY;  Surgeon: Gearlean Alf, MD;  Location: [facility];  Service: Orthopedics;  Laterality: Left;   TOTAL KNEE ARTHROPLASTY Right 01/10/2019   Procedure: TOTAL KNEE ARTHROPLASTY;  Surgeon: Gaynelle Arabian, MD;  Location: [facility];  Service: Orthopedics;  Laterality: Right;  58mn   Social History   Socioeconomic History   Marital status: Divorced    Spouse name: Not on file   Number of children: 1   Years of education: Not on file   Highest education level: Not on file  Occupational History    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     Highest level of education:  One year graduate school   Current Outpatient Medications on File Prior to Visit  Medication Sig Dispense Refill   albuterol (VENTOLIN HFA) 108 (90 Base) MCG/ACT inhaler Inhale 2 puffs into the lungs every 6 (six) hours as needed for wheezing or shortness of breath.      atorvastatin (LIPITOR) 20 MG tablet Take 20 mg by mouth daily with supper.   0   Blood Glucose Monitoring Suppl (ONE TOUCH ULTRA 2) w/Device KIT Use to check blood sugar 2-3 times a day 1 each 0    celecoxib (CELEBREX) 200 MG capsule TAKE 1 CAPSULE BY MOUTH EVERY DAY 30 capsule 1   diazepam (VALIUM) 5 MG tablet Take 5-10 mg by mouth every 8 (eight) hours as needed for anxiety.     diphenhydrAMINE (BENADRYL) 25 mg capsule Take 25 mg by mouth every 6 (six) hours as needed for allergies.      fluticasone (FLONASE) 50 MCG/ACT nasal spray Place 2 sprays into both nostrils daily as needed for allergies.      FREESTYLE TEST STRIPS test strip   5   glipiZIDE (GLUCOTROL XL) 5 MG 24 hr tablet Take 1 tablet 2x a day before a meal 180 tablet 3   Iron-FA-B  Cmp-C-Biot-Probiotic (FUSION PLUS) CAPS Take 1 capsule by mouth daily.      losartan-hydrochlorothiazide (HYZAAR) 100-25 MG per tablet Take 1 tablet by mouth every morning.     metFORMIN (GLUCOPHAGE-XR) 500 MG 24 hr tablet TAKE 4 TABLETS BY MOUTH DAILY WITH SUPPER. 360 tablet 3   omeprazole (PRILOSEC) 40 MG capsule Take 40 mg by mouth daily before breakfast.     OZEMPIC, 1 MG/DOSE, 4 MG/3ML SOPN INJECT 1MG INTO THE SKIN ONCE A WEEK 9 mL 3   potassium chloride (KLOR-CON) 10 MEQ tablet Take 10 mEq by mouth daily.     sertraline (ZOLOFT) 50 MG tablet Take 50 mg by mouth daily with supper.     terbinafine (LAMISIL) 250 MG tablet Please take one a day x 7days, repeat every 4 weeks x 4 months 28 tablet 0   zolpidem (AMBIEN) 10 MG tablet Take 10 mg by mouth at bedtime as needed for sleep.      No current facility-administered medications on file prior to visit.   Allergies  Allergen Reactions   Diclofenac Other (See Comments)    GAS   Glucotrol [Glipizide] Nausea Only   Lisinopril Cough   Adhesive [Tape] Rash   Family History  Problem Relation Age of Onset   Heart attack Father        Deceased, 107   COPD Father    Breast cancer Mother 32       Deceased, 65   Obesity Sister    Hypertension Brother    Healthy Son     PE: BP 130/80 (BP Location: Right Arm, Patient Position: Sitting, Cuff Size: Normal)    Pulse 64    Ht '5\' 5"'  (1.651 m)    Wt  250 lb 6.4 oz (113.6 kg)    LMP 06/16/2005    SpO2 96%    BMI 41.67 kg/m  Wt Readings from Last 3 Encounters:  08/13/21 250 lb 6.4 oz (113.6 kg)  02/11/21 247 lb 12.8 oz (112.4 kg)  11/01/20 241 lb (109.3 kg)   Constitutional: overweight, in NAD Eyes: PERRLA, EOMI, no exophthalmos ENT: moist mucous membranes, no thyromegaly, no cervical lymphadenopathy Cardiovascular: RRR, No MRG Respiratory: CTA B Musculoskeletal: no deformities, strength intact in all 4 Skin: moist, warm, no rashes Neurological: no tremor with outstretched hands, DTR normal in all 4 Diabetic Foot Exam - Simple   Simple Foot Form Diabetic Foot exam was performed with the following findings: Yes 08/13/2021 10:16 AM  Visual Inspection No deformities, no ulcerations, no other skin breakdown bilaterally: Yes Sensation Testing Intact to touch and monofilament testing bilaterally: Yes Pulse Check Posterior Tibialis and Dorsalis pulse intact bilaterally: Yes Comments L hallux onychomycosis - partial nail    ASSESSMENT: 1. DM2, non-insulin-dependent, now more controlled, without long-term complications, but with hyperglycemia  2. HL  3. Obesity class II  PLAN:  1. Patient with longstanding, previously uncontrolled type 2 diabetes, with improved control in the last 3 years.  She is on metformin extended release, sulfonylurea, and weekly GLP-1 receptor agonist.  HbA1c at last visit was higher, at 6.4%, increased from 6.1%.  At that time, he had occasional higher blood sugars after larger meals or if she was forgetting the medications the previous night.  We discussed about crushing glipizide ER tablet before a larger meal or if she had dessert.  Otherwise, we did not change the regimen.  We discussed about possibly increasing Ozempic dose but that higher dose was on backorder at that time so we  did not proceed with this. -At today's visit, sugars are fluctuating between the normal range and slightly above.  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 -For now, she does not that she forgot to the crushed glipizide ER tablet before a larger meal or before a meal with dessert since last visit.  For now, my suggestion would be to increase Ozempic to 2 mg weekly, since she is tolerating well the lower dose.  She agrees with this management. -We also discussed about optimizing her meals not necessarily by decreasing the amount but by replacing the foods with low calorie density foods.  We also discussed about the importance of the order of the foods that she eats and emphasized the need to start the meal with fiber/protein/fat and add carbs only at the end.  We discussed about optimal timing for eating, especially dinner, the importance of exercising/increasing activity after eating sweets, etc. - I suggested to:  Patient Instructions  Please continue: - Metformin ER 2000 mg after dinner - Glipizide ER 5 mg before b'fast   Please increase: - Ozempic 2 mg weekly  Please return in 4 months with your sugar log   - we checked her HbA1c: 6.9% (higher) - advised to check sugars at different times of the day - 1x a day, rotating check times - advised for yearly eye exams >> she is UTD - she is due for annual labs-we will order today - return to clinic in 4 months  2. HL -Reviewed latest lipid panel from 07/2020: Fractions at goal -She continues Lipitor 20 mg daily without side effects -She is due for another lipid panel-we will check this today  3. Obesity class 2  -She was seen at [facility] for binge eating disorder, but not in the last few years -We will continue Ozempic which should also help with weight loss and reducing cravings -I also advised her to increase the dose today -She gained 13 pounds before the last 2 visits combined -she gained 3 lbs since last OV  Component     Latest Ref Rng & Units 08/13/2021  Sodium     135 -  145 mEq/L 139  Potassium     3.5 - 5.1 mEq/L 4.0  Chloride     96 - 112 mEq/L 101  CO2     19 - 32 mEq/L 27  Glucose     70 - 99 mg/dL 134 (H)  BUN     6 - 23 mg/dL 19  Creatinine     0.40 - 1.20 mg/dL 0.80  Total Bilirubin     0.2 - 1.2 mg/dL 0.5  Alkaline Phosphatase     39 - 117 U/L 60  AST     0 - 37 U/L 17  ALT     0 - 35 U/L 19  Total Protein     6.0 - 8.3 g/dL 7.1  Albumin     3.5 - 5.2 g/dL 4.5  GFR     >60.00 mL/min 77.27  Calcium     8.4 - 10.5 mg/dL 9.7  Cholesterol     0 - 200 mg/dL 174  Triglycerides     0.0 - 149.0 mg/dL 217.0 (H)  HDL Cholesterol     >39.00 mg/dL 84.60  VLDL     0.0 - 40.0 mg/dL 43.4 (H)  Total CHOL/HDL Ratio  2  NonHDL      89.38  Microalb, Ur     0.0 - 1.9 mg/dL 0.8  Creatinine,U     mg/dL 157.4  MICROALB/CREAT RATIO     0.0 - 30.0 mg/g 0.5  Direct LDL     mg/dL 33.0  Triglycerides elevated (not a fasting sample), the rest of the fractions at goal.  Philemon Kingdom, MD PhD [facility] Endocrinology

## 2021-08-13 NOTE — Patient Instructions (Addendum)
Please continue: - Metformin ER 2000 mg after dinner - Glipizide ER 5 mg before b'fast   Please increase: - Ozempic 2 mg weekly  Please return in 4 months with your sugar log

## 2021-09-30 NOTE — Progress Notes (Signed)
66 y.o. G56P1001 Single Caucasian female here for annual exam.   ? ?Considering a second opinion for a GI specialist.  ? ?Took Ditropan XL in the past through this office.  ?Considering restarting.  ?She is leaking urine overnight and wears incontinence underwear. ?She occasional leak with sneeze.  ? ?Pelvic pain resolved after her last visit.  ?Since February this year, has had some mild discomfort.  ? ?Brother's sister died of uterine cancer.  ? ?Lost her job at [employer] in October.  ? ?PCP:   London Pepper, MD ?Endocrinology:  Dr. Cruzita Lederer. ? ?Patient's last menstrual period was 06/16/2005.     ?  ?    ?Sexually active: No.  ?The current method of family planning is post menopausal status.    ?Exercising: walking 30 min per day   ?Smoker:  no ? ?Health Maintenance: ?Pap:  12-04-17 Neg:Neg HR HPV, 02-02-15 Neg:Neg HR HPV, 09-28-12 Neg ?History of abnormal Pap:  Yes, 1998 ?MMG:  06-26-20 Neg/Birada1 ?Colonoscopy:  11-12-16 f/u 67yr ?BMD:   NA ?TDaP:  2017 ?Gardasil:   n/a ?Hep C:12-04-17 Neg ?Screening Labs:  PCP ? ? reports that she has never smoked. She has never been exposed to tobacco smoke. She has never used smokeless tobacco. She reports current alcohol use of about 2.0 standard drinks per week. She reports that she does not use drugs. ? ?Past Medical History:  ?Diagnosis Date  ? Abnormal pap 1998  ? Negative Hpv  ? Anxiety   ? Arthritis   ? Cameron lesion, chronic   ? Diabetes mellitus   ? Dysrhythmia   ? hx of atrial tachycardia and pacs  ? Esophageal reflux   ? H/O hiatal hernia   ? H/O iron deficiency anemia 08/2016  ? Hypercholesteremia   ? Hypertension   ? Obesity   ? compulsive overeater  ? Osteoarthritis   ? ? ?Past Surgical History:  ?Procedure Laterality Date  ? APPENDECTOMY  1975  ? TOTAL KNEE ARTHROPLASTY  01/12/2012  ? Procedure: TOTAL KNEE ARTHROPLASTY;  Surgeon: FGearlean Alf MD;  Location: [facility];  Service: Orthopedics;  Laterality: Left;  ? TOTAL KNEE ARTHROPLASTY Right 01/10/2019  ? Procedure: TOTAL  KNEE ARTHROPLASTY;  Surgeon: AGaynelle Arabian MD;  Location: [facility];  Service: Orthopedics;  Laterality: Right;  568m  ? ? ?Current Outpatient Medications  ?Medication Sig Dispense Refill  ? albuterol (VENTOLIN HFA) 108 (90 Base) MCG/ACT inhaler Inhale 2 puffs into the lungs every 6 (six) hours as needed for wheezing or shortness of breath.     ? atorvastatin (LIPITOR) 20 MG tablet Take 20 mg by mouth daily with supper.   0  ? Blood Glucose Monitoring Suppl (ONE TOUCH ULTRA 2) w/Device KIT Use to check blood sugar 2-3 times a day 1 each 0  ? celecoxib (CELEBREX) 200 MG capsule TAKE 1 CAPSULE BY MOUTH EVERY DAY 30 capsule 1  ? diazepam (VALIUM) 5 MG tablet Take 5-10 mg by mouth every 8 (eight) hours as needed for anxiety.    ? diphenhydrAMINE (BENADRYL) 25 mg capsule Take 25 mg by mouth every 6 (six) hours as needed for allergies.     ? fluticasone (FLONASE) 50 MCG/ACT nasal spray Place 2 sprays into both nostrils daily as needed for allergies.     ? FREESTYLE TEST STRIPS test strip   5  ? glipiZIDE (GLUCOTROL XL) 5 MG 24 hr tablet Take 1 tablet 2x a day before a meal 180 tablet 3  ? Iron-FA-B Cmp-C-Biot-Probiotic (FUSION PLUS)  CAPS Take 1 capsule by mouth daily.     ? losartan-hydrochlorothiazide (HYZAAR) 100-25 MG per tablet Take 1 tablet by mouth every morning.    ? metFORMIN (GLUCOPHAGE-XR) 500 MG 24 hr tablet TAKE 4 TABLETS BY MOUTH DAILY WITH SUPPER. 360 tablet 3  ? omeprazole (PRILOSEC) 40 MG capsule Take 40 mg by mouth daily before breakfast.    ? potassium chloride (KLOR-CON) 10 MEQ tablet Take 10 mEq by mouth daily.    ? Semaglutide, 2 MG/DOSE, 8 MG/3ML SOPN Inject 2 mg as directed once a week. 9 mL 3  ? sertraline (ZOLOFT) 50 MG tablet Take 50 mg by mouth daily with supper.    ? terbinafine (LAMISIL) 250 MG tablet Please take one a day x 7days, repeat every 4 weeks x 4 months 28 tablet 0  ? zolpidem (AMBIEN) 10 MG tablet Take 10 mg by mouth at bedtime as needed for sleep.     ? ?No current  facility-administered medications for this visit.  ? ? ?Family History  ?Problem Relation Age of Onset  ? Heart attack Father   ?     Deceased, 39  ? COPD Father   ? Breast cancer Mother 73  ?     Deceased, 82  ? Obesity Sister   ? Hypertension Brother   ? Healthy Son   ? ? ?Review of Systems  See HPI.  ? ?Exam:   ?BP 122/80 (BP Location: Right Arm, Patient Position: Sitting, Cuff Size: Large)   Ht _0  (1.651 m)   Wt 248 lb (112.5 kg)   LMP 06/16/2005   BMI 41.27 kg/m?     ?General appearance: alert, cooperative and appears stated age ?Head: normocephalic, without obvious abnormality, atraumatic ?Neck: no adenopathy, supple, symmetrical, trachea midline and thyroid normal to inspection and palpation ?Lungs: clear to auscultation bilaterally ?Breasts: normal appearance, no masses or tenderness, No nipple retraction or dimpling, No nipple discharge or bleeding, No axillary adenopathy ?Heart: regular rate and rhythm ?Abdomen: soft, non-tender; no masses, no organomegaly ?Extremities: extremities normal, atraumatic, no cyanosis or edema ?Skin: skin color, texture, turgor normal. No rashes or lesions ?Lymph nodes: cervical, supraclavicular, and axillary nodes normal. ?Neurologic: grossly normal ? ?Pelvic: External genitalia:  no lesions ?             No abnormal inguinal nodes palpated. ?             Urethra:  normal appearing urethra with no masses, tenderness or lesions ?             Bartholins and Skenes: normal    ?             Vagina: normal appearing vagina with normal color and discharge, patch of well outline erythema of the posterior vaginal cuff.  ?             Cervix: no lesions ?             Pap taken: yes ?Bimanual Exam:  Uterus:  normal size, contour, position, consistency, mobility, non-tender ?             Adnexa: no mass, fullness, tenderness ?             Rectal exam: yes.  Confirms. ?             Anus:  normal sphincter tone, no lesions ? ?Chaperone was present for exam:  Estill Bamberg,  CMA ? ?Assessment:   ?Well woman visit with gynecologic exam. ?Hx  chronic pelvic pain.  ?Hx remote abnormal pap smear.  ?Overactive bladder.  ?FH breast cancer in mother.  ?Vaginal lesion.  Lichen planus? ?Health care counseling.  ? ?Plan: ?Mammogram screening discussed. ?Self breast awareness reviewed. ?Pap and HR HPV as above. ?Return for colposcopy and biopsy of vagina.  ?Guidelines for Calcium, Vitamin D, regular exercise program including cardiovascular and weight bearing exercise. ?Ditropan XL 10 mg daily.  #90, RF 3.  ?[facility] density ordered for [facility]. ?Information to patient about G providers at [place], [place] code reader. ?Follow up annually and prn.  ? ?30 min  total time was spent for this patient encounter, including preparation, face-to-face counseling with the patient, coordination of care, and documentation of the encounter. ?face to face time of which over 50% was spent in counseling.  ? ?After visit summary provided.  ? ? ? ?

## 2021-10-02 ENCOUNTER — Encounter: Payer: Self-pay | Admitting: Obstetrics and Gynecology

## 2021-10-02 ENCOUNTER — Ambulatory Visit (INDEPENDENT_AMBULATORY_CARE_PROVIDER_SITE_OTHER): Payer: Medicare Other | Admitting: Obstetrics and Gynecology

## 2021-10-02 ENCOUNTER — Other Ambulatory Visit (HOSPITAL_COMMUNITY)
Admission: RE | Admit: 2021-10-02 | Discharge: 2021-10-02 | Disposition: A | Discharge status: Home / Self Care | Payer: Medicare Other | Source: Ambulatory Visit | Attending: Obstetrics and Gynecology | Admitting: Obstetrics and Gynecology

## 2021-10-02 VITALS — BP 122/80 | Ht 65.0 in | Wt 248.0 lb

## 2021-10-02 DIAGNOSIS — N898 Other specified noninflammatory disorders of vagina: Secondary | ICD-10-CM | POA: Diagnosis not present

## 2021-10-02 DIAGNOSIS — Z01419 Encounter for gynecological examination (general) (routine) without abnormal findings: Secondary | ICD-10-CM

## 2021-10-02 DIAGNOSIS — N3281 Overactive bladder: Secondary | ICD-10-CM | POA: Diagnosis not present

## 2021-10-02 DIAGNOSIS — Z124 Encounter for screening for malignant neoplasm of cervix: Secondary | ICD-10-CM

## 2021-10-02 DIAGNOSIS — Z780 Asymptomatic menopausal state: Secondary | ICD-10-CM

## 2021-10-02 DIAGNOSIS — Z1151 Encounter for screening for human papillomavirus (HPV): Secondary | ICD-10-CM | POA: Diagnosis not present

## 2021-10-02 MED ORDER — OXYBUTYNIN CHLORIDE ER 10 MG PO TB24: 10.0000 mg | ORAL_TABLET | Freq: Every day | ORAL | 3 refills | Status: DC

## 2021-10-02 NOTE — Patient Instructions (Signed)
EXERCISE AND DIET:  We recommended that you start or continue a regular exercise program for good health. Regular exercise means any activity that makes your heart beat faster and makes you sweat.  We recommend exercising at least 30 minutes per day at least 3 days a week, preferably 4 or 5.  We also recommend a diet low in fat and sugar.  Inactivity, poor dietary choices and obesity can cause diabetes, heart attack, stroke, and kidney damage, among others.   ? ?ALCOHOL AND SMOKING:  Women should limit their alcohol intake to no more than 7 drinks/beers/glasses of wine (combined, not each!) per week. Moderation of alcohol intake to this level decreases your risk of breast cancer and liver damage. And of course, no recreational drugs are part of a healthy lifestyle.  And absolutely no smoking or even second hand smoke. Most people know smoking can cause heart and lung diseases, but did you know it also contributes to weakening of your bones? Aging of your skin?  Yellowing of your teeth and nails? ? ?CALCIUM AND VITAMIN D:  Adequate intake of calcium and Vitamin D are recommended.  The recommendations for exact amounts of these supplements seem to change often, but generally speaking 600 mg of calcium (either carbonate or citrate) and 800 units of Vitamin D per day seems prudent. Certain women may benefit from higher intake of Vitamin D.  If you are among these women, your doctor will have told you during your visit.   ? ?PAP SMEARS:  Pap smears, to check for cervical cancer or precancers,  have traditionally been done yearly, although recent scientific advances have shown that most women can have pap smears less often.  However, every woman still should have a physical exam from her gynecologist every year. It will include a breast check, inspection of the vulva and vagina to check for abnormal growths or skin changes, a visual exam of the cervix, and then an exam to evaluate the size and shape of the uterus and  ovaries.  And after 66 years of age, a rectal exam is indicated to check for rectal cancers. We will also provide age appropriate advice regarding health maintenance, like when you should have certain vaccines, screening for sexually transmitted diseases, bone density testing, colonoscopy, mammograms, etc.  ? ?MAMMOGRAMS:  All women over 66 years old should have a yearly mammogram. Many facilities now offer a "3D" mammogram, which may cost around $50 extra out of pocket. If possible,  we recommend you accept the option to have the 3D mammogram performed.  It both reduces the number of women who will be called back for extra views which then turn out to be normal, and it is better than the routine mammogram at detecting truly abnormal areas.   ? ?COLONOSCOPY:  Colonoscopy to screen for colon cancer is recommended for all women at age 66.  We know, you hate the idea of the prep.  We agree, BUT, having colon cancer and not knowing it is worse!!  Colon cancer so often starts as a polyp that can be seen and removed at colonscopy, which can quite literally save your life!  And if your first colonoscopy is normal and you have no family history of colon cancer, most women don't have to have it again for 10 years.  Once every ten years, you can do something that may end up saving your life, right?  We will be happy to help you get it scheduled when you are ready.  Be sure to check your insurance coverage so you understand how much it will cost.  It may be covered as a preventative service at no cost, but you should check your particular policy.   ? ?Calcium Content in Foods ?Calcium is the most abundant mineral in the body. Most of the body's calcium supply is stored in bones and teeth. Calcium helps many parts of the body function normally, including: ?Blood and blood vessels. ?Nerves. ?Hormones. ?Muscles. ?Bones and teeth. ?When your calcium stores are low, you may be at risk for low bone mass, bone loss, and broken bones  (fractures). When you get enough calcium, it helps to support strong bones and teeth throughout your life. ?Calcium is especially important for: ?Children during growth spurts. ?Girls during adolescence. ?Women who are pregnant or breastfeeding. ?Women after their menstrual cycle stops (postmenopause). ?Women whose menstrual cycle has stopped due to anorexia nervosa or regular intense exercise. ?People who cannot eat or digest dairy products. ?Vegans. ?Recommended daily amounts of calcium: ?Women (ages 66 to 81): 1,000 mg per day. ?Women (ages 66 and older): 1,200 mg per day. ?Men (ages 61 to 21): 1,000 mg per day. ?Men (ages 37 and older): 1,200 mg per day. ?Women (ages 33 to 66): 1,300 mg per day. ?Men (ages 59 to 3): 1,300 mg per day. ?General information ?Eat foods that are high in calcium. Try to get most of your calcium from food. ?Some people may benefit from taking calcium supplements. Check with your health care provider or diet and nutrition specialist (dietitian) before starting any calcium supplements. Calcium supplements may interact with certain medicines. Too much calcium may cause other health problems, such as constipation and kidney stones. ?For the body to absorb calcium, it needs vitamin D. Sources of vitamin D include: ?Skin exposure to direct sunlight. ?Foods, such as egg yolks, liver, mushrooms, saltwater fish, and fortified milk. ?Vitamin D supplements. Check with your health care provider or dietitian before starting any vitamin D supplements. ?What foods are high in calcium? ? ?Foods that are high in calcium contain more than 100 milligrams per serving. ?Fruits ?Fortified orange juice or other fruit juice, 300 mg per 8 oz serving. ?Vegetables ?Collard greens, 360 mg per 8 oz serving. ?Kale, 100 mg per 8 oz serving. ?Bok choy, 160 mg per 8 oz serving. ?Grains ?Fortified ready-to-eat cereals, 100 to 1,000 mg per 8 oz serving. ?Fortified frozen waffles, 200 mg in 2 waffles. ?Oatmeal, 140 mg in  1 cup. ?Meats and other proteins ?Sardines, canned with bones, 325 mg per 3 oz serving. ?Salmon, canned with bones, 180 mg per 3 oz serving. ?Canned shrimp, 125 mg per 3 oz serving. ?Baked beans, 160 mg per 4 oz serving. ?Tofu, firm, made with calcium sulfate, 253 mg per 4 oz serving. ?Dairy ?Yogurt, plain, low-fat, 310 mg per 6 oz serving. ?Nonfat milk, 300 mg per 8 oz serving. ?American cheese, 195 mg per 1 oz serving. ?Cheddar cheese, 205 mg per 1 oz serving. ?Cottage cheese 2%, 105 mg per 4 oz serving. ?Fortified soy, rice, or almond milk, 300 mg per 8 oz serving. ?Mozzarella, part skim, 210 mg per 1 oz serving. ?The items listed above may not be a complete list of foods high in calcium. Actual amounts of calcium may be different depending on processing. Contact a dietitian for more information. ?What foods are lower in calcium? ?Foods that are lower in calcium contain 50 mg or less per serving. ?Fruits ?Apple, about 6 mg. ?Banana, about 12 mg. ?  Vegetables ?Lettuce, 19 mg per 2 oz serving. ?Tomato, about 11 mg. ?Grains ?Rice, 4 mg per 6 oz serving. ?Boiled potatoes, 14 mg per 8 oz serving. ?White bread, 6 mg per slice. ?Meats and other proteins ?Egg, 27 mg per 2 oz serving. ?Red meat, 7 mg per 4 oz serving. ?Chicken, 17 mg per 4 oz serving. ?Fish, cod, or trout, 20 mg per 4 oz serving. ?Dairy ?Cream cheese, regular, 14 mg per 1 Tbsp serving. ?Brie cheese, 50 mg per 1 oz serving. ?Parmesan cheese, 70 mg per 1 Tbsp serving. ?The items listed above may not be a complete list of foods lower in calcium. Actual amounts of calcium may be different depending on processing. Contact a dietitian for more information. ?Summary ?Calcium is an important mineral in the body because it affects many functions. Getting enough calcium helps support strong bones and teeth throughout your life. ?Try to get most of your calcium from food. ?Calcium supplements may interact with certain medicines. Check with your health care provider  or dietitian before starting any calcium supplements. ?This information is not intended to replace advice given to you by your health care provider. Make sure you discuss any questions you have with your h

## 2021-10-03 ENCOUNTER — Other Ambulatory Visit: Payer: Self-pay | Admitting: Obstetrics and Gynecology

## 2021-10-03 DIAGNOSIS — Z1231 Encounter for screening mammogram for malignant neoplasm of breast: Secondary | ICD-10-CM

## 2021-10-03 LAB — CYTOLOGY - PAP
Comment: NEGATIVE
Diagnosis: NEGATIVE
High risk HPV: NEGATIVE

## 2021-10-04 ENCOUNTER — Other Ambulatory Visit: Payer: Self-pay

## 2021-10-04 DIAGNOSIS — N898 Other specified noninflammatory disorders of vagina: Secondary | ICD-10-CM

## 2021-10-04 NOTE — Progress Notes (Signed)
error 

## 2021-10-07 NOTE — Progress Notes (Signed)
?  Subjective:  ?  ? Patient ID: Barbara Jackson, female   DOB: [birth year], 66 y.o.   MRN: [id number] ? ?HPI ?Patient here today for colposcopy due to vaginal lesion seen at Annual Exam. Remote abnormal pap history--?1998. ?Pap normal and negative HR HPV on 10/02/21. ? ?States colposcopy in the past was very painful. ? ?Took 2 Xanax this am at 8:00, and it is not helping to reduce anxiety.  ? ?After her pelvic exams in the past she has pain with urination x 3, and then it resolves spontaneously.   ? ?After her last pelvic exam, she had a brief regurgance of her pelvic pain, which then resolved.  ?This has been chronic for her in the past, but has improved over time.  ? ?Review of Systems  ?All other systems reviewed and are negative. ?LMP: PMP ?Contraception: PMP ? ?   ?Objective:  ? Physical Exam ? ?Colposcopy - cervix, vagina. ?Consent for procedure.  ?3% acetic acid used in vagina and on cervix. ?White light and green light filter used.  ?Colposcopy satisfactory:  Yes   __x___          No    _____ ?Findings:    ?Cervix:  no lesions.  Atrophy noted.  ?Vagina:  patch of erythema at posterior vaginal cuff below cervix, 2 x 1 cm. Not raised.   ?Biopsies:  2 biopsies of the posterior vaginal cuff.  ?Monsel's placed.  ?Minimal EBL. ?No complications.  ? ?   ?Assessment:  ?   ?Vaginal lesion.  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  ?   ?Plan:  ?   ?Fu biopsies.  ?Final plan to follow.  ? ?

## 2021-10-15 ENCOUNTER — Encounter: Payer: Self-pay | Admitting: Obstetrics and Gynecology

## 2021-10-15 ENCOUNTER — Ambulatory Visit (INDEPENDENT_AMBULATORY_CARE_PROVIDER_SITE_OTHER): Payer: Medicare Other | Admitting: Obstetrics and Gynecology

## 2021-10-15 ENCOUNTER — Other Ambulatory Visit (HOSPITAL_COMMUNITY)
Admission: RE | Admit: 2021-10-15 | Discharge: 2021-10-15 | Disposition: A | Discharge status: Home / Self Care | Payer: Medicare Other | Source: Ambulatory Visit | Attending: Obstetrics and Gynecology | Admitting: Obstetrics and Gynecology

## 2021-10-15 DIAGNOSIS — N898 Other specified noninflammatory disorders of vagina: Secondary | ICD-10-CM | POA: Diagnosis not present

## 2021-10-15 NOTE — Patient Instructions (Signed)
Colposcopy, Care After  The following information offers guidance on how to care for yourself after your procedure. Your doctor may also give you more specific instructions. If you have problems or questions, contact your doctor. What can I expect after the procedure? If you did not have a sample of your tissue taken out (did not have a biopsy), you may only have some spotting of blood for a few days. You can go back to your normal activities. If you had a sample of your tissue taken out, it is common to have: Soreness and mild pain. These may last for a few days. Mild bleeding or fluid (discharge) coming from your vagina. The fluid will look dark and grainy. You may have this for a few days. The fluid may be caused by a liquid that was used during your procedure. You may need to wear a sanitary pad. Spotting of blood for at least 48 hours after the procedure. Follow these instructions at home: Medicines Take over-the-counter and prescription medicines only as told by your doctor. Ask your doctor what over-the-counter pain medicines and prescription medicines you can start taking again. This is very important if you take blood thinners. Activity For at least 3 days, or for as long as told by your doctor, avoid: Douching. Using tampons. Having sex. Return to your normal activities as told by your doctor. Ask your doctor what activities are safe for you. General instructions Ask your doctor if you may take baths, swim, or use a hot tub. You may take showers. If you use birth control (contraception), keep using it. Keep all follow-up visits. Contact a doctor if: You have a fever or chills. You faint or feel light-headed. Get help right away if: You bleed a lot from your vagina. A lot of bleeding means that the bleeding soaks through a pad in less than 1 hour. You have clumps of blood (blood clots) coming from your vagina. You have signs that could mean you have an infection. This may be  fluid coming from your vagina that is: Different than normal. Yellow. Bad-smelling. You have very bad pain or cramps in your lower belly that do not get better with medicine. Summary If you did not have a sample of your tissue taken out, you may only have some spotting of blood for a few days. You can go back to your normal activities. If you had a sample of your tissue taken out, it is common to have mild pain for a few days and spotting for 48 hours. Avoid douching, using tampons, and having sex for at least 3 days after the procedure or for as long as told. Get help right away if you have a lot of bleeding, very bad pain, or signs of infection. This information is not intended to replace advice given to you by your health care provider. Make sure you discuss any questions you have with your health care provider. Document Revised: 10/28/2020 Document Reviewed: 10/28/2020 [employer] Patient Education  2023 [employer]  

## 2021-10-21 DIAGNOSIS — Z0289 Encounter for other administrative examinations: Secondary | ICD-10-CM

## 2021-10-22 ENCOUNTER — Ambulatory Visit: Payer: BC Managed Care – PPO | Admitting: Obstetrics and Gynecology

## 2021-10-22 LAB — SURGICAL PATHOLOGY

## 2021-11-18 ENCOUNTER — Other Ambulatory Visit: Payer: Self-pay | Admitting: Obstetrics and Gynecology

## 2021-11-18 DIAGNOSIS — Z780 Asymptomatic menopausal state: Secondary | ICD-10-CM

## 2021-11-19 ENCOUNTER — Ambulatory Visit
Admission: RE | Admit: 2021-11-19 | Discharge: 2021-11-19 | Disposition: A | Discharge status: Home / Self Care | Payer: Medicare Other | Source: Ambulatory Visit | Attending: Obstetrics and Gynecology | Admitting: Obstetrics and Gynecology

## 2021-11-19 ENCOUNTER — Other Ambulatory Visit: Payer: Self-pay | Admitting: Obstetrics and Gynecology

## 2021-11-19 DIAGNOSIS — Z1231 Encounter for screening mammogram for malignant neoplasm of breast: Secondary | ICD-10-CM

## 2021-11-19 DIAGNOSIS — Z780 Asymptomatic menopausal state: Secondary | ICD-10-CM

## 2021-12-02 ENCOUNTER — Ambulatory Visit: Payer: Medicare Other

## 2021-12-04 ENCOUNTER — Encounter (INDEPENDENT_AMBULATORY_CARE_PROVIDER_SITE_OTHER): Payer: Self-pay | Admitting: Bariatrics

## 2021-12-04 ENCOUNTER — Ambulatory Visit (INDEPENDENT_AMBULATORY_CARE_PROVIDER_SITE_OTHER): Payer: Medicare PPO | Admitting: Bariatrics

## 2021-12-04 VITALS — BP 114/81 | HR 93 | Temp 97.7°F | Ht 65.0 in | Wt 242.0 lb

## 2021-12-04 DIAGNOSIS — R0602 Shortness of breath: Secondary | ICD-10-CM | POA: Diagnosis not present

## 2021-12-04 DIAGNOSIS — G4733 Obstructive sleep apnea (adult) (pediatric): Secondary | ICD-10-CM | POA: Diagnosis not present

## 2021-12-04 DIAGNOSIS — E559 Vitamin D deficiency, unspecified: Secondary | ICD-10-CM

## 2021-12-04 DIAGNOSIS — E7849 Other hyperlipidemia: Secondary | ICD-10-CM

## 2021-12-04 DIAGNOSIS — Z6841 Body Mass Index (BMI) 40.0 and over, adult: Secondary | ICD-10-CM

## 2021-12-04 DIAGNOSIS — I10 Essential (primary) hypertension: Secondary | ICD-10-CM | POA: Diagnosis not present

## 2021-12-04 DIAGNOSIS — F509 Eating disorder, unspecified: Secondary | ICD-10-CM | POA: Insufficient documentation

## 2021-12-04 DIAGNOSIS — R5383 Other fatigue: Secondary | ICD-10-CM

## 2021-12-04 DIAGNOSIS — E1165 Type 2 diabetes mellitus with hyperglycemia: Secondary | ICD-10-CM

## 2021-12-04 DIAGNOSIS — Z1331 Encounter for screening for depression: Secondary | ICD-10-CM

## 2021-12-04 DIAGNOSIS — F5089 Other specified eating disorder: Secondary | ICD-10-CM

## 2021-12-04 DIAGNOSIS — Z7984 Long term (current) use of oral hypoglycemic drugs: Secondary | ICD-10-CM

## 2021-12-04 DIAGNOSIS — Z7985 Long-term (current) use of injectable non-insulin antidiabetic drugs: Secondary | ICD-10-CM

## 2021-12-04 NOTE — Progress Notes (Signed)
Office: [phone]  /  Fax: [phone]    Date: 12/16/2021   Appointment Start Time: 11:07am Duration: 49 minutes Provider: Glennie Jackson, Psy.D. Type of Session: Intake for Individual Therapy  Location of Patient: Home (private location) Location of Provider: Provider's home (private office) Type of Contact: Telepsychological Visit via MyChart Video Visit  Informed Consent: Prior to proceeding with today's appointment, two pieces of identifying information were obtained. In addition, Barbara Jackson's physical location at the time of this appointment was obtained as well a phone number she could be reached at in the event of technical difficulties. Barbara Jackson and this provider participated in today's telepsychological service.   The provider's role was explained to [name]. The provider reviewed and discussed issues of confidentiality, privacy, and limits therein (e.g., reporting obligations). In addition to verbal informed consent, written informed consent for psychological services was obtained prior to the initial appointment. Since the clinic is not a 24/7 crisis center, mental health emergency resources were shared and this  provider explained MyChart, e-mail, voicemail, and/or other messaging systems should be utilized only for non-emergency reasons. This provider also explained that information obtained during appointments will be placed in Barbara Jackson's medical record and relevant information will be shared with other providers at [facility] for coordination of care. Barbara Jackson agreed information may be shared with other [facility] providers as needed for coordination of care and by signing the service agreement document, she provided written consent for coordination of care. Prior to initiating telepsychological services, Barbara Jackson completed an informed consent document, which included the development of a safety plan (i.e., an emergency contact and emergency resources) in the  event of an emergency/crisis. Barbara Jackson verbally acknowledged understanding she is ultimately responsible for understanding her insurance benefits for telepsychological and in-person services. This provider also reviewed confidentiality, as it relates to telepsychological services. Barbara Jackson  acknowledged understanding that appointments cannot be recorded without both party consent and she is aware she is responsible for securing confidentiality on her end of the session. Barbara Jackson verbally consented to proceed.  Chief Complaint/HPI: Barbara Jackson was referred by Dr. Jearld Jackson due to other disorder of eating. Per the note for the initial visit with Dr. Jearld Jackson on 12/04/2021, "Barbara Jackson is doing intermittent eating, and has lost weight." The note for the initial appointment further indicated the following: "Barbara Jackson's habits were reviewed today and are as follows: her desired weight loss is 32 lbs, she started gaining weight after child birth, her heaviest weight ever was 286 pounds, she has significant food cravings issues, she snacks frequently in the evenings, she is frequently drinking liquids with calories, she frequently makes poor food choices, and she struggles with emotional eating." Barbara Jackson's Food and Mood (modified PHQ-9) score on 12/04/2021 was 7.  During today's appointment, Barbara Jackson reported a belief that her cravings for sweets is impacting her ability to lose weight. She disclosed a history of attending therapeutic services with an "intuitive eating therapist." Barbara Jackson further discussed a history of losing weight at different points in her life for surgeries, adding she needs to lose weight again for a surgery. Regarding sweets intake, she stated she consumed approximately "4-5 servings" of sweets daily throughout the course of the day prior to starting with the clinic. She indicated a belief sweets helps "calm [me]" and "sharpens [her] thinking." She reported the number of servings have decreased since starting with the  clinic, but described the frequency as daily. She is unsure of any specific triggers for emotional eating behaviors. She  denied current engagement in binge eating behaviors. Barbara Jackson disclosed a history of attending a binge eating support group in 2018, adding a belief that she did not meet criteria for binge eating disorder. Barbara Jackson denied a history of significantly restricting food intake, purging and engagement in other compensatory strategies. She is unsure if she has ever been diagnosed with an eating disorder. 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   Mental Status Examination:  Appearance: neat Behavior: appropriate to circumstances Mood: anxious Affect: mood congruent Speech: WNL Eye Contact: appropriate Psychomotor Activity: WNL Gait: unable to assess  Thought Process: linear, logical, and goal directed and denies suicidal, homicidal, and self-harm ideation, plan and intent  Thought Content/Perception: no hallucinations, delusions, bizarre thinking or behavior endorsed or observed Orientation: AAOx4 Memory/Concentration: memory, attention, language, and fund of knowledge intact  Insight/Judgment: fair  Family & Psychosocial History: Barbara Jackson reported she is not in a relationship and she has one son (age 49). She indicated she retired at the beginning of this year. Additionally, [name] shared her highest level of education obtained is a bachelor's degree. Currently, [name] social support system consists of her sister (resides in [place]), two friends (reside in [place]), and son (resides in [place]). Moreover, [name] stated she resides with her two cats.   Medical History:  Past Medical History:  Diagnosis Date   Abnormal pap 1998   Negative Hpv   Anxiety    Arthritis    Lysbeth Jackson lesion, chronic    Diabetes mellitus    Dysrhythmia    hx of atrial tachycardia and pacs   Esophageal reflux    H/O hiatal  hernia    H/O iron deficiency anemia 08/2016   Hypercholesteremia    Hypertension    Obesity    compulsive overeater   Osteoarthritis    Past Surgical History:  Procedure Laterality Date   APPENDECTOMY  1975   TOTAL KNEE ARTHROPLASTY  01/12/2012   Procedure: TOTAL KNEE ARTHROPLASTY;  Surgeon: Gearlean Alf, MD;  Location: [facility];  Service: Orthopedics;  Laterality: Left;   TOTAL KNEE ARTHROPLASTY Right 01/10/2019   Procedure: TOTAL KNEE ARTHROPLASTY;  Surgeon: Gaynelle Arabian, MD;  Location: [facility];  Service: Orthopedics;  Laterality: Right;  64mn   Current Outpatient Medications on File Prior to Visit  Medication Sig Dispense Refill   albuterol (VENTOLIN HFA) 108 (90 Base) MCG/ACT inhaler Inhale 2 puffs into the lungs every 6 (six) hours as needed for wheezing or shortness of breath.      atorvastatin (LIPITOR) 20 MG tablet Take 20 mg by mouth daily with supper.   0   Blood Glucose Monitoring Suppl (ONE TOUCH ULTRA 2) w/Device KIT Use to check blood sugar 2-3 times a day 1 each 0   celecoxib (CELEBREX) 200 MG capsule TAKE 1 CAPSULE BY MOUTH EVERY DAY 30 capsule 1   diazepam (VALIUM) 5 MG tablet Take 5-10 mg by mouth every 8 (eight) hours as needed for anxiety.     diphenhydrAMINE (BENADRYL) 25 mg capsule Take 25 mg by mouth every 6 (six) hours as needed for allergies.      fluticasone (FLONASE) 50 MCG/ACT nasal spray Place 2 sprays into both nostrils daily as needed for allergies.      FREESTYLE TEST STRIPS test strip   5   glipiZIDE (GLUCOTROL XL) 5 MG 24 hr tablet Take 1 tablet 2x a day before a meal 180 tablet 3   Iron-FA-B Cmp-C-Biot-Probiotic (FUSION PLUS)  CAPS Take 1 capsule by mouth daily.      losartan-hydrochlorothiazide (HYZAAR) 100-25 MG per tablet Take 1 tablet by mouth every morning.     metFORMIN (GLUCOPHAGE-XR) 500 MG 24 hr tablet TAKE 4 TABLETS BY MOUTH DAILY WITH SUPPER. 360 tablet 3   omeprazole (PRILOSEC) 40 MG capsule Take 40 mg by mouth daily before breakfast.      oxybutynin (DITROPAN XL) 10 MG 24 hr tablet Take 1 tablet (10 mg total) by mouth at bedtime. 90 tablet 3   potassium chloride (KLOR-CON) 10 MEQ tablet Take 10 mEq by mouth daily.     Semaglutide, 2 MG/DOSE, 8 MG/3ML SOPN Inject 2 mg as directed once a week. 9 mL 3   sertraline (ZOLOFT) 50 MG tablet Take 50 mg by mouth daily with supper.     terbinafine (LAMISIL) 250 MG tablet Please take one a day x 7days, repeat every 4 weeks x 4 months 28 tablet 0   zolpidem (AMBIEN) 10 MG tablet Take 10 mg by mouth at bedtime as needed for sleep.      No current facility-administered medications on file prior to visit.  Medication compliant.   Mental Health History: Barbara Jackson reported a history of therapeutic services, noting the last time she attended therapeutic services was last year. She explained her therapist retired last year. She expressed desire to find a new therapist, adding she has an initial appointment with a new provider on December 25, 2021. Barbara Jackson reported her PCP currently prescribes Zoloft and Ambien PRN, noting a belief Zoloft may no longer be helping. Barbara Jackson reported there is no history of hospitalizations for psychiatric concerns. Barbara Jackson denied a family history of mental health/substance abuse related concerns. Christyana reported there is no history of trauma including psychological, physical , and sexual abuse, as well as neglect.   Barbara Jackson described her typical mood lately as "subdued and worried." She explained she often worries about her son, especially when she is unable to "get a hold of him."  Barbara Jackson also discussed experiencing "really low energy," adding it may have started when she retired. She feels the aforementioned is independent of concerns related to her son. Moreover, she described a history of experiencing symptoms of depression and anxiety since her son started experiencing challenges in 2010. She described her anxiety as generalized and explained she experiences difficulty relaxing when dealing  with "uncertainty." She explained she "ha[s] a hard time dealing with anything else if there are things outstanding that need to be resolved." While she does not feel like she has experienced panic attacks, she believes there were a "couple" occasions were she experienced panic symptoms (e.g., feeling jittery, feeling "super anxious," difficulty "control[ling] thoughts," feeling a sense of doom) in the past six months. She could not recall the last time she experienced the aforementioned. Barbara Jackson reported she consumes approximately one standard alcohol drink 1-2xs a month. She denied tobacco use. She denied illicit/recreational substance use. Furthermore, Barbara Jackson indicated she is not experiencing the following: hallucinations and delusions, paranoia, symptoms of mania , social withdrawal, crying spells, panic attacks, memory concerns, attention and concentration issues, and obsessions and compulsions. She also denied history of and current suicidal ideation, plan, and intent; history of and current homicidal ideation, plan, and intent; and history of and current engagement in self-harm.  Legal History: Barbara Jackson reported there is no history of legal involvement.   Structured Assessments Results: The Patient Health Questionnaire-9 (PHQ-9) is a self-report measure that assesses symptoms and severity of depression over the course  of the last two weeks. Barbara Jackson obtained a score of 7 suggesting mild depression. Barbara Jackson finds the endorsed symptoms to be somewhat difficult. [0= Not at all; 1= Several days; 2= More than half the days; 3= Nearly every day] Little interest or pleasure in doing things 1  Feeling down, depressed, or hopeless 1  Trouble falling or staying asleep, or sleeping too much 1  Feeling tired or having little energy 3  Poor appetite or overeating 0  Feeling bad about yourself --- or that you are a failure or have let yourself or your family down 0  Trouble concentrating on things, such as reading the  newspaper or watching television 1  Moving or speaking so slowly that other people could have noticed? Or the opposite --- being so fidgety or restless that you have been moving around a lot more than usual 0  Thoughts that you would be better off dead or hurting yourself in some way 0  PHQ-9 Score 7    The Generalized Anxiety Disorder-7 (GAD-7) is a brief self-report measure that assesses symptoms of anxiety over the course of the last two weeks. Barbara Jackson obtained a score of 12 suggesting moderate anxiety. Barbara Jackson finds the endorsed symptoms to be somewhat difficult. [0= Not at all; 1= Several days; 2= Over half the days; 3= Nearly every day] Feeling nervous, anxious, on edge 3  Not being able to stop or control worrying 3  Worrying too much about different things (e.g., son's well-being, insurance) 3  Trouble relaxing 1  Being so restless that it's hard to sit still 0  Becoming easily annoyed or irritable 1  Feeling afraid as if something awful might happen 1  GAD-7 Score 12   Interventions:  Conducted a chart review Focused on rapport building Verbally administered PHQ-9 and GAD-7 for symptom monitoring Provided emphatic reflections and validation Collaborated with patient on a treatment goal  Psychoeducation provided regarding physical versus emotional hunger Recommended/discussed options for psychiatric services   Diagnostic Impressions & Provisional DSM-5 Diagnosis(es): Kaiyana reported a history of engaging in emotional eating behaviors, noting she continues to engage in emotional eating behaviors daily. She denied engagement in any other disordered eating behaviors. Based on the aforementioned, the following diagnosis was assigned: F50.89 Other Specified Feeding or Eating Disorder, Emotional Eating Behaviors. Additionally, Cheyan reported a history of anxiety and depression related symptoms starting around 2010. She described experiencing generalized anxiety and endorsed various items on the  GAD-7. As such, the following diagnosis is warranted: F41.1 Generalized Anxiety Disorder. During today's appointment, she also endorsed depression-related symptoms during the clinical interview and on the PHQ-9. As such, the following diagnosis was assigned: F33.0 Major Depressive Disorder, Recurrent Episode, Mild.   Plan: Rhianon appears able and willing to participate as evidenced by collaboration on a treatment goal, engagement in reciprocal conversation, and asking questions as needed for clarification. The next appointment is scheduled for 01/06/2022 at 2pm, which will be via [place] Visit. The following treatment goal was established: increase coping skills. This provider will regularly review the treatment plan and medical chart to keep informed of status changes. Katerin expressed understanding and agreement with the initial treatment plan of care. Tylesha will be sent a handout via e-mail to utilize between now and the next appointment to increase awareness of hunger patterns and subsequent eating. Hareem provided verbal consent during today's appointment for this provider to send the handout via e-mail. Additionally, [name] provided verbal consent for this provider to e-mail referrals for psychiatric and  therapeutic services to address depression/anxiety related symptoms.

## 2021-12-05 LAB — COMPREHENSIVE METABOLIC PANEL
ALT: 29 IU/L (ref 0–32)
AST: 22 IU/L (ref 0–40)
Albumin/Globulin Ratio: 1.8 (ref 1.2–2.2)
Albumin: 4.8 g/dL (ref 3.8–4.8)
Alkaline Phosphatase: 66 IU/L (ref 44–121)
BUN/Creatinine Ratio: 18 (ref 12–28)
BUN: 16 mg/dL (ref 8–27)
Bilirubin Total: 0.4 mg/dL (ref 0.0–1.2)
CO2: 21 mmol/L (ref 20–29)
Calcium: 10.4 mg/dL — ABNORMAL HIGH (ref 8.7–10.3)
Chloride: 97 mmol/L (ref 96–106)
Creatinine, Ser: 0.89 mg/dL (ref 0.57–1.00)
Globulin, Total: 2.6 g/dL (ref 1.5–4.5)
Glucose: 156 mg/dL — ABNORMAL HIGH (ref 70–99)
Potassium: 4.2 mmol/L (ref 3.5–5.2)
Sodium: 139 mmol/L (ref 134–144)
Total Protein: 7.4 g/dL (ref 6.0–8.5)
eGFR: 72 mL/min/{1.73_m2} (ref >59)

## 2021-12-05 LAB — VITAMIN D 25 HYDROXY (VIT D DEFICIENCY, FRACTURES): Vit D, 25-Hydroxy: 27.6 ng/mL — ABNORMAL LOW (ref 30.0–100.0)

## 2021-12-05 LAB — HEMOGLOBIN A1C
Est. average glucose Bld gHb Est-mCnc: 143 mg/dL
Hgb A1c MFr Bld: 6.6 % — ABNORMAL HIGH (ref 4.8–5.6)

## 2021-12-05 LAB — INSULIN, RANDOM: INSULIN: 11.8 u[IU]/mL (ref 2.6–24.9)

## 2021-12-05 LAB — TSH+T4F+T3FREE
Free T4: 1.35 ng/dL (ref 0.82–1.77)
T3, Free: 2.8 pg/mL (ref 2.0–4.4)
TSH: 2.32 u[IU]/mL (ref 0.450–4.500)

## 2021-12-10 ENCOUNTER — Encounter (INDEPENDENT_AMBULATORY_CARE_PROVIDER_SITE_OTHER): Payer: Self-pay | Admitting: Bariatrics

## 2021-12-13 ENCOUNTER — Encounter: Payer: Self-pay | Admitting: Obstetrics and Gynecology

## 2021-12-16 ENCOUNTER — Telehealth (INDEPENDENT_AMBULATORY_CARE_PROVIDER_SITE_OTHER): Payer: Medicare PPO | Admitting: Psychology

## 2021-12-16 DIAGNOSIS — F411 Generalized anxiety disorder: Secondary | ICD-10-CM | POA: Diagnosis not present

## 2021-12-16 DIAGNOSIS — F5089 Other specified eating disorder: Secondary | ICD-10-CM | POA: Diagnosis not present

## 2021-12-16 DIAGNOSIS — F330 Major depressive disorder, recurrent, mild: Secondary | ICD-10-CM | POA: Diagnosis not present

## 2021-12-18 ENCOUNTER — Encounter (INDEPENDENT_AMBULATORY_CARE_PROVIDER_SITE_OTHER): Payer: Self-pay | Admitting: Bariatrics

## 2021-12-18 ENCOUNTER — Ambulatory Visit (INDEPENDENT_AMBULATORY_CARE_PROVIDER_SITE_OTHER): Payer: Medicare PPO | Admitting: Bariatrics

## 2021-12-18 VITALS — BP 126/84 | HR 84 | Temp 97.9°F | Wt 242.0 lb

## 2021-12-18 DIAGNOSIS — E559 Vitamin D deficiency, unspecified: Secondary | ICD-10-CM | POA: Diagnosis not present

## 2021-12-18 DIAGNOSIS — Z7985 Long-term (current) use of injectable non-insulin antidiabetic drugs: Secondary | ICD-10-CM

## 2021-12-18 DIAGNOSIS — Z6841 Body Mass Index (BMI) 40.0 and over, adult: Secondary | ICD-10-CM

## 2021-12-18 DIAGNOSIS — E669 Obesity, unspecified: Secondary | ICD-10-CM | POA: Diagnosis not present

## 2021-12-18 DIAGNOSIS — E1165 Type 2 diabetes mellitus with hyperglycemia: Secondary | ICD-10-CM

## 2021-12-18 DIAGNOSIS — F5089 Other specified eating disorder: Secondary | ICD-10-CM | POA: Diagnosis not present

## 2021-12-18 DIAGNOSIS — Z7984 Long term (current) use of oral hypoglycemic drugs: Secondary | ICD-10-CM

## 2021-12-18 MED ORDER — VITAMIN D (ERGOCALCIFEROL) 1.25 MG (50000 UNIT) PO CAPS: 50000.0000 [IU] | ORAL_CAPSULE | ORAL | 0 refills | Status: DC

## 2021-12-18 NOTE — Progress Notes (Signed)
Chief Complaint:   OBESITY Barbara Jackson is here to discuss her progress with her obesity treatment plan along with follow-up of her obesity related diagnoses. Barbara Jackson is on the Category 3 Plan and states she is following her eating plan approximately 25% of the time. Barbara Jackson states she is swimming for 20 minutes 1 time per week, walking for 15 minutes 2 times per week, and bike riding for 20 minutes 2 times per week.  Today's visit was #: 2 Starting weight: 242 lbs Starting date: 12/04/2021 Today's weight: 241 lbs Today's date: 12/18/2021 Total lbs lost to date: 1 Total lbs lost since last in-office visit: 1  Interim History: Barbara Jackson is down 1 pound since her first visit.  She was surprised by the amount of meat on her plan.  Her challenge is her snacking (chocolate).  Subjective:   1. Type 2 diabetes mellitus with hyperglycemia, without long-term current use of insulin (HCC) Taegan is taking glipizide, metformin, and Ozempic.  Her A1c is 6.6.  2. Vitamin D insufficiency Barbara Jackson's vitamin D level is 27.6.  3. Other disorder of eating Barbara Jackson is seeing Dr. Mallie Mussel, our bariatric psychologist.  Assessment/Plan:   1. Type 2 diabetes mellitus with hyperglycemia, without long-term current use of insulin (HCC) Jema will look at intensive eating and she is to decrease her intake of sugar.  2. Vitamin D insufficiency Haisley agreed to start prescription vitamin D 50,000 units once weekly, with no refills.  - Vitamin D, Ergocalciferol, (DRISDOL) 1.25 MG (50000 UNIT) CAPS capsule; Take 1 capsule (50,000 Units total) by mouth every 7 (seven) days.  Dispense: 5 capsule; Refill: 0  3. Other disorder of eating Barbara Jackson will continue to follow-up with Dr. Mallie Mussel (strategies for emotional eating).  She will limit her chocolate intake on certain days.  She will choose better choices for chocolate and snacks.  4. Obesity, Current BMI 40.2 Barbara Jackson is currently in the action stage of change. As such, her goal is to  continue with weight loss efforts. She has agreed to the Category 3 Plan.   Meal planning was discussed.  Barbara Jackson will hear more closely to the plan 80-90%.  Reviewed labs with the patient from 12/04/2021, CMP, B12, A1c, insulin, and thyroid panel.  She is to try to make better choices.  Exercise goals: As is.   Behavioral modification strategies: increasing lean protein intake, decreasing simple carbohydrates, increasing vegetables, increasing water intake, decreasing eating out, no skipping meals, meal planning and cooking strategies, keeping healthy foods in the home, and planning for success.  Barbara Jackson has agreed to follow-up with our clinic in 2 weeks. She was informed of the importance of frequent follow-up visits to maximize her success with intensive lifestyle modifications for her multiple health conditions.   Objective:   Blood pressure 126/84, pulse 84, temperature 97.9 F (36.6 C), last menstrual period 06/16/2005, SpO2 97 %. There is no height or weight on file to calculate BMI.  General: Cooperative, alert, well developed, in no acute distress. HEENT: Conjunctivae and lids unremarkable. Cardiovascular: Regular rhythm.  Lungs: Normal work of breathing. Neurologic: No focal deficits.   Lab Results  Component Value Date   CREATININE 0.89 12/04/2021   BUN 16 12/04/2021   NA 139 12/04/2021   K 4.2 12/04/2021   CL 97 12/04/2021   CO2 21 12/04/2021   Lab Results  Component Value Date   ALT 29 12/04/2021   AST 22 12/04/2021   ALKPHOS 66 12/04/2021   BILITOT 0.4 12/04/2021  Lab Results  Component Value Date   HGBA1C 6.6 (H) 12/04/2021   HGBA1C 6.4 (A) 02/11/2021   HGBA1C 6.1 (A) 09/19/2020   HGBA1C 6.3 (A) 05/16/2020   HGBA1C 6.1 (A) 08/23/2019   Lab Results  Component Value Date   INSULIN 11.8 12/04/2021   Lab Results  Component Value Date   TSH 2.320 12/04/2021   Lab Results  Component Value Date   CHOL 174 08/13/2021   HDL 84.60 08/13/2021   LDLDIRECT 33.0  08/13/2021   TRIG 217.0 (H) 08/13/2021   CHOLHDL 2 08/13/2021   Lab Results  Component Value Date   VD25OH 27.6 (L) 12/04/2021   Lab Results  Component Value Date   WBC 12.5 (H) 01/12/2019   HGB 12.2 01/12/2019   HCT 37.3 01/12/2019   MCV 86.5 01/12/2019   PLT 417 (H) 01/12/2019   Lab Results  Component Value Date   FERRITIN 22 12/04/2017   Attestation Statements:   Reviewed by clinician on day of visit: allergies, medications, problem list, medical history, surgical history, family history, social history, and previous encounter notes.   Wilhemena Durie, am acting as Location manager for [employer], DO.  I have reviewed the above documentation for accuracy and completeness, and I agree with the above. Jearld Lesch, DO

## 2021-12-19 ENCOUNTER — Encounter (INDEPENDENT_AMBULATORY_CARE_PROVIDER_SITE_OTHER): Payer: Self-pay | Admitting: Bariatrics

## 2021-12-23 ENCOUNTER — Telehealth (INDEPENDENT_AMBULATORY_CARE_PROVIDER_SITE_OTHER): Payer: Self-pay | Admitting: Psychology

## 2021-12-23 NOTE — Progress Notes (Signed)
  Office: [phone]  /  Fax: [phone]    Date: 01/06/2022   Appointment Start Time: 2:01pm Duration: 29 minutes Provider: Glennie Isle, Psy.D. Type of Session: Individual Therapy  Location of Patient: Home (private location) Location of Provider: Provider's Home (private office) Type of Contact: Telepsychological Visit via MyChart Video Visit  Session Content: Barbara Jackson is a 66 y.o. female presenting for a follow-up appointment to address the previously established treatment goal of increasing coping skills.Today's appointment was a telepsychological visit. Barbara Jackson provided verbal consent for today's telepsychological appointment and she is aware she is responsible for securing confidentiality on her end of the session. Prior to proceeding with today's appointment, Barbara Jackson's physical location at the time of this appointment was obtained as well a phone number she could be reached at in the event of technical difficulties. Barbara Jackson and this provider participated in today's telepsychological service.   This provider conducted a brief check-in. Barbara Jackson reported reflecting on emotional versus physical hunger. She observed she often experiences emotional hunger secondary to feeling "nervous." Psychoeducation regarding triggers for emotional eating was provided. Barbara Jackson was provided a handout, and encouraged to utilize the handout between now and the next appointment to increase awareness of triggers and frequency. Barbara Jackson agreed. This provider also discussed behavioral strategies for specific triggers, such as placing the utensil down when conversing to avoid mindless eating. Barbara Jackson provided verbal consent during today's appointment for this provider to send a handout about triggers via e-mail. Of note, Barbara Jackson initiated therapeutic services with a new therapist, noting, "It went really well and I'm going to continue with her." Overall, Barbara Jackson was receptive to today's appointment as evidenced by openness to sharing,  responsiveness to feedback, and willingness to explore triggers for emotional eating.  Mental Status Examination:  Appearance: neat Behavior: appropriate to circumstances Mood: anxious Affect: mood congruent Speech: WNL Eye Contact: appropriate Psychomotor Activity: WNL Gait: unable to assess Thought Process: linear, logical, and goal directed and no evidence or endorsement of suicidal, homicidal, and self-harm ideation, plan and intent  Thought Content/Perception: no hallucinations, delusions, bizarre thinking or behavior endorsed or observed Orientation: AAOx4 Memory/Concentration: memory, attention, language, and fund of knowledge intact  Insight: good Judgment: fair  Interventions:  Conducted a brief chart review Provided empathic reflections and validation Reviewed content from the previous session Provided positive reinforcement Employed supportive psychotherapy interventions to facilitate reduced distress and to improve coping skills with identified stressors Psychoeducation provided regarding triggers for emotional eating behaviors  DSM-5 Diagnosis(es):  F50.89 Other Specified Feeding or Eating Disorder, Emotional Eating Behaviors, F41.1 Generalized Anxiety Disorder, and F33.0 Major Depressive Disorder, Recurrent Episode, Mild  Treatment Goal & Progress: During the initial appointment with this provider, the following treatment goal was established: increase coping skills. Barbara Jackson has demonstrated some progress in her goal as evidenced by increased awareness of hunger patterns.   Plan: The next appointment is scheduled for 01/28/2022 at 8:30am, which will be via MyChart Video Visit. The next session will focus on working towards the established treatment goal. Barbara Jackson will continue with her primary therapist.

## 2021-12-23 NOTE — Telephone Encounter (Signed)
Pt saw Dr.Brown.

## 2021-12-23 NOTE — Telephone Encounter (Signed)
  Office: [phone]  /  Fax: [phone]  Date of Call: December 23, 2021  Time of Call: 10:59am Duration of Call: 1.5 minute(s) Provider: Glennie Isle, PsyD  CONTENT: This provider called [name] to follow-up as she sent MyChart messages regarding a handout sent. The handout was discussed again. She acknowledged understanding. No evidence or endorsement of any safety concerns. All questions/concerns addressed.   PLAN: No further follow-up planned by this provider.

## 2021-12-24 ENCOUNTER — Ambulatory Visit: Payer: Medicare PPO | Admitting: Internal Medicine

## 2021-12-24 ENCOUNTER — Encounter: Payer: Self-pay | Admitting: Internal Medicine

## 2021-12-24 VITALS — BP 128/82 | HR 72 | Ht 65.0 in | Wt 244.4 lb

## 2021-12-24 DIAGNOSIS — E1165 Type 2 diabetes mellitus with hyperglycemia: Secondary | ICD-10-CM

## 2021-12-24 DIAGNOSIS — E785 Hyperlipidemia, unspecified: Secondary | ICD-10-CM | POA: Diagnosis not present

## 2021-12-24 DIAGNOSIS — E669 Obesity, unspecified: Secondary | ICD-10-CM | POA: Diagnosis not present

## 2021-12-24 MED ORDER — TIRZEPATIDE 7.5 MG/0.5ML ~~LOC~~ SOAJ: 7.5000 mg | SUBCUTANEOUS | 5 refills | Status: DC

## 2021-12-24 NOTE — Progress Notes (Signed)
Patient ID: Barbara Jackson, female   DOB: May 30, 1956, 66 y.o.   MRN: [id number]   HPI: Barbara Jackson is a 66 y.o.-year-old female, returning for follow-up for DM2, dx in 2007, non-insulin-dependent, now more controlled, without long-term complications.  Last visit 4.5 months ago.  Interim history: No increased urination, blurry vision, nausea, chest pain.  She started to lose weight-8 pounds since last visit, then gained 2 back -now seen in the weight management clinic - started 2-3 weeks ago.  She also started to see the clinic psychologist. Her sister is using Ozempic and lost a significant amount of weight.  She would like to try [facility], since she feels that she did not benefit from [place] as much as her sister.  Reviewed HbA1c levels: 08/13/2021: HbA1c 6.9% Lab Results  Component Value Date   HGBA1C 6.6 (H) 12/04/2021   HGBA1C 6.4 (A) 02/11/2021   HGBA1C 6.1 (A) 09/19/2020   HGBA1C 6.3 (A) 05/16/2020   HGBA1C 6.1 (A) 08/23/2019   HGBA1C 6.4 (A) 04/25/2019   HGBA1C 6.2 (A) 12/20/2018   HGBA1C 6.5 (A) 08/17/2018   HGBA1C 6.1 (A) 05/03/2018  09/19/2020: HbA1c 6.1% 07/25/2020: HbA1c 6.4% 11/25/2019: HbA1c 6.6% 02/22/2018: HbA1c 7.0% 07/2017: HbA1c 7.7% 07/10/2016: HbA1c 7.0%  Pt is on a regimen of: - Metformin ER 2000 mg after dinner - Glipizide XL 10 >> 5-10 mg before breakfast  >> 5 mg in am - Ozempic 0.5 >> 1 >> 2 mg weekly in a.m. We stopped Actos 08/17/2018. She tried Januvia, but stopped last year when she was on a new diet at that time and did not want to make any changes in her medicines.  Pt checks her sugars  0 to once a day per review of her log: - am:  126-150, 155 (forgot meds) >> 120-144, 153 >> 124-147 - 2h after b'fast: 124-147, 167 >> 120, 126 >> 112-151, 196 >> 105-175 - before lunch: 102-120 >> 127, 130 >> 101-145, 163 >> 98-147 - 2h after lunch: 103-177 >> 134-181 >> 117-147 >> 119-197, 234 - before dinner:  92-145, 165 >> 80-160, 243 >> 107-149, 190 - 2h  after dinner: 113-164 >> 141-196 >> 125-200 >> n/c - bedtime: 137-151 >> 130-166  >> 144-190 >> 120-162 - nighttime: n/c >> 120, 133 >> n/c Lowest sugar was  99 >> 89 >> 107 >> 92 >> 80; she has hypoglycemia awareness in the 70s. Highest sugar was  238 (dessert) >> 230 >> 190 >> 196 (dessert) >> 200  Glucometer: One Touch Ultra 2  In the past, she read Dr. Janene Harvey program for reversing diabetes program started to adopt the changes suggested in the log.  Her sugars started to improve significantly.   She also saw [name] with nutrition in the past.  She was being seen in the binge eating clinic at [facility].  However, she was not seen in the last 2 years.  -No CKD, last BUN/creatinine:  Lab Results  Component Value Date   BUN 16 12/04/2021   BUN 19 08/13/2021   CREATININE 0.89 12/04/2021   CREATININE 0.80 08/13/2021  02/22/2018: 17/0.88, GFR 65, glucose 136, with the rest of the CMP normal; ACR 6.4 07/10/2016: 20/0.83 On losartan 100.  -+ HL;  last set of lipids: Lab Results  Component Value Date   CHOL 174 08/13/2021   HDL 84.60 08/13/2021   LDLDIRECT 33.0 08/13/2021   TRIG 217.0 (H) 08/13/2021   CHOLHDL 2 08/13/2021  07/25/2020: 145/161/83/38 06/12/2019: 170/137/76/71 02/22/2018:  139/109/71/46 07/10/2016: 164/122/84/32  On Lipitor 40.  - last eye exam was 11/21/2021: No DR reportedly; glaucoma suspect [facility], Dr. Gershon Crane).   - no numbness and tingling in her feet.  She sees podiatry-Dr. Paulla Dolly.  Last foot exam 07/2021.  Pt has FH of DM in [place].  She also has depression-on Cymbalta, HTN, frequent UTIs and pelvic floor pain.  Previously on treatment of pain chronically. She saw several specialists but no clear diagnosis was made.  Symptoms resolved ~10/2020. She is on iron -it consistently.  ROS: + see HPI  I reviewed pt's medications, allergies, PMH, social hx, family hx, and changes were documented in the history of present illness. Otherwise, unchanged  from my initial visit note.  Past Medical History:  Diagnosis Date   Abnormal pap 1998   Negative Hpv   Anxiety    Arthritis    [name] lesion, chronic    Diabetes mellitus    Dysrhythmia    hx of atrial tachycardia and pacs   Esophageal reflux    H/O hiatal hernia    H/O iron deficiency anemia 08/2016   Hypercholesteremia    Hypertension    Obesity    compulsive overeater   Osteoarthritis    Past Surgical History:  Procedure Laterality Date   APPENDECTOMY  1975   TOTAL KNEE ARTHROPLASTY  01/12/2012   Procedure: TOTAL KNEE ARTHROPLASTY;  Surgeon: Gearlean Alf, MD;  Location: [facility];  Service: Orthopedics;  Laterality: Left;   TOTAL KNEE ARTHROPLASTY Right 01/10/2019   Procedure: TOTAL KNEE ARTHROPLASTY;  Surgeon: Gaynelle Arabian, MD;  Location: [facility];  Service: Orthopedics;  Laterality: Right;  80mn   Social History   Socioeconomic History   Marital status: Divorced    Spouse name: Not on file   Number of children: 1   Years of education: Not on file   Highest education level: Not on file  Occupational History    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     Highest level of education:  One year graduate school   Current Outpatient Medications on File Prior to Visit  Medication Sig Dispense Refill   albuterol (VENTOLIN HFA) 108 (90 Base) MCG/ACT inhaler Inhale 2 puffs into the lungs every 6 (six) hours as needed for wheezing or shortness of breath.      atorvastatin (LIPITOR) 20 MG tablet Take 20 mg by mouth daily with supper.   0   Blood Glucose  Monitoring Suppl (ONE TOUCH ULTRA 2) w/Device KIT Use to check blood sugar 2-3 times a day 1 each 0   celecoxib (CELEBREX) 200 MG capsule TAKE 1 CAPSULE BY MOUTH EVERY DAY 30 capsule 1   diazepam (VALIUM) 5 MG tablet Take 5-10 mg by mouth every 8 (eight) hours as needed for anxiety.     diphenhydrAMINE (BENADRYL) 25 mg capsule Take 25 mg by mouth every 6 (six) hours as needed for allergies.      fluticasone (FLONASE) 50 MCG/ACT nasal spray Place 2 sprays into both  nostrils daily as needed for allergies.      FREESTYLE TEST STRIPS test strip   5   glipiZIDE (GLUCOTROL XL) 5 MG 24 hr tablet Take 1 tablet 2x a day before a meal 180 tablet 3   Iron-FA-B Cmp-C-Biot-Probiotic (FUSION PLUS) CAPS Take 1 capsule by mouth daily.      losartan-hydrochlorothiazide (HYZAAR) 100-25 MG per tablet Take 1 tablet by mouth every morning.     metFORMIN (GLUCOPHAGE-XR) 500 MG 24 hr tablet TAKE 4 TABLETS BY MOUTH DAILY WITH SUPPER. 360 tablet 3   omeprazole (PRILOSEC) 40 MG capsule Take 40 mg by mouth daily before breakfast.     oxybutynin (DITROPAN XL) 10 MG 24 hr tablet Take 1 tablet (10 mg total) by mouth at bedtime. 90 tablet 3   potassium chloride (KLOR-CON) 10 MEQ tablet Take 10 mEq by mouth daily.     Semaglutide, 2 MG/DOSE, 8 MG/3ML SOPN Inject 2 mg as directed once a week. 9 mL 3   sertraline (ZOLOFT) 50 MG tablet Take 50 mg by mouth daily with supper.     terbinafine (LAMISIL) 250 MG tablet Please take one a day x 7days, repeat every 4 weeks x 4 months 28 tablet 0   Vitamin D, Ergocalciferol, (DRISDOL) 1.25 MG (50000 UNIT) CAPS capsule Take 1 capsule (50,000 Units total) by mouth every 7 (seven) days. 5 capsule 0   zolpidem (AMBIEN) 10 MG tablet Take 10 mg by mouth at bedtime as needed for sleep.      No current facility-administered medications on file prior to visit.   Allergies  Allergen Reactions   Diclofenac Other (See Comments)    GAS   Glucotrol [Glipizide] Nausea Only   Lisinopril Cough    Adhesive [Tape] Rash   Family History  Problem Relation Age of Onset   Breast cancer Mother 12       Deceased, 38   Heart attack Father        Deceased, 22   COPD Father    Obesity Sister    Breast cancer Cousin 21       maternal 1st cousin   Hypertension Brother    Healthy Son     PE: BP 128/82 (BP Location: Right Arm, Patient Position: Sitting, Cuff Size: Normal)   Pulse 72   Ht '5\' 5"'  (1.651 m)   Wt 244 lb 6.4 oz (110.9 kg)   LMP 06/16/2005   SpO2 98%   BMI 40.67 kg/m  Wt Readings from Last 3 Encounters:  12/24/21 244 lb 6.4 oz (110.9 kg)  12/18/21 242 lb (109.8 kg)  12/04/21 242 lb (109.8 kg)   Constitutional: overweight, in NAD Eyes: PERRLA, EOMI, no exophthalmos ENT: moist mucous membranes, no thyromegaly, no cervical lymphadenopathy Cardiovascular: RRR, No MRG Respiratory: CTA B Musculoskeletal: no deformities Skin: moist, warm, no rashes Neurological: no tremor with outstretched hands  ASSESSMENT: 1. DM2, non-insulin-dependent, now more controlled, without long-term complications, but with hyperglycemia  2. HL  3. Obesity class II  PLAN:  1. Patient with longstanding, previously uncontrolled type 2 diabetes, with improved control in the last 4 years.  She continues on metformin ER, sulfonylurea and weekly GLP-1 receptor agonist, dose increased at last visit.  At that time, sugars are fluctuating between the normal range and slightly above.  This is possibly due to increased stress after being laid off from work but she was planning to retire.  We discussed about improving her diet and replacing the foods with low calorie density foods.  We also discussed about the order of the foods that she was eating and discussed about optimal timing for eating especially dinner and the importance of exercising/increasing activity especially after eating sweets.  HbA1c at that time was slightly higher, and 6.9%, however, since last visit she had another HbA1c last month which  was improved, at 6.6%. -Sugars are at or slightly above target now and she feels that they did not improve significantly after increasing Ozempic.  Also, she does not feel that she lost a significant amount of weight.  She would be interested in try [facility].  We discussed about switching to 7.5 mg of Mounjaro weekly if covered by insurance.  We can titrate the dose up if needed and I am hoping that we can stop glipizide soon, if sugars improved.  If she cannot get [facility], she agrees to remain on Ozempic. - I suggested to:  Patient Instructions  Please continue: - Metformin ER 2000 mg after dinner - Glipizide ER 5 mg before b'fast   Please change from: - Ozempic to Mounjaro 7.5 mg weekly  Please return in 4 months with your sugar log  - advised to check sugars at different times of the day - 1x a day, rotating check times - advised for yearly eye exams >> she is UTD - return to clinic in 3-4 months  2. HL - Reviewed latest lipid panel from 07/2021: LDL at goal, triglycerides high: Lab Results  Component Value Date   CHOL 174 08/13/2021   HDL 84.60 08/13/2021   LDLDIRECT 33.0 08/13/2021   TRIG 217.0 (H) 08/13/2021   CHOLHDL 2 08/13/2021  -She continues on Lipitor 20 mg daily without side effects  3. Obesity class 2  -She was seen at [facility] for binge eating disorder, but not in the last few years -Now on Ozempic but will switch to [facility] for stronger effect -She gained 16 pounds before the last 3 visits combined -She now sees weight management clinic (started 2-3 weeks ago) -lost 6 pounds since last visit  Philemon Kingdom, MD PhD [facility] Endocrinology

## 2021-12-24 NOTE — Patient Instructions (Addendum)
Please continue: - Metformin ER 2000 mg after dinner - Glipizide ER 5 mg before b'fast   Please change from: - Ozempic to Mounjaro 7.5 mg weekly  Please return in 4 months with your sugar log

## 2022-01-06 ENCOUNTER — Telehealth (INDEPENDENT_AMBULATORY_CARE_PROVIDER_SITE_OTHER): Payer: Medicare PPO | Admitting: Psychology

## 2022-01-06 DIAGNOSIS — F5089 Other specified eating disorder: Secondary | ICD-10-CM

## 2022-01-06 DIAGNOSIS — F411 Generalized anxiety disorder: Secondary | ICD-10-CM | POA: Diagnosis not present

## 2022-01-06 DIAGNOSIS — F330 Major depressive disorder, recurrent, mild: Secondary | ICD-10-CM

## 2022-01-16 ENCOUNTER — Encounter (INDEPENDENT_AMBULATORY_CARE_PROVIDER_SITE_OTHER): Payer: Self-pay | Admitting: Bariatrics

## 2022-01-16 ENCOUNTER — Ambulatory Visit (INDEPENDENT_AMBULATORY_CARE_PROVIDER_SITE_OTHER): Payer: Medicare PPO | Admitting: Bariatrics

## 2022-01-16 VITALS — BP 128/84 | HR 96 | Temp 97.5°F | Ht 65.0 in | Wt 241.0 lb

## 2022-01-16 DIAGNOSIS — E559 Vitamin D deficiency, unspecified: Secondary | ICD-10-CM | POA: Diagnosis not present

## 2022-01-16 DIAGNOSIS — I10 Essential (primary) hypertension: Secondary | ICD-10-CM | POA: Diagnosis not present

## 2022-01-16 DIAGNOSIS — Z6841 Body Mass Index (BMI) 40.0 and over, adult: Secondary | ICD-10-CM | POA: Diagnosis not present

## 2022-01-16 DIAGNOSIS — E669 Obesity, unspecified: Secondary | ICD-10-CM

## 2022-01-16 MED ORDER — VITAMIN D (ERGOCALCIFEROL) 1.25 MG (50000 UNIT) PO CAPS: 50000.0000 [IU] | ORAL_CAPSULE | ORAL | 0 refills | Status: DC

## 2022-01-22 ENCOUNTER — Encounter (INDEPENDENT_AMBULATORY_CARE_PROVIDER_SITE_OTHER): Payer: Self-pay

## 2022-01-27 NOTE — Progress Notes (Unsigned)
Chief Complaint:   OBESITY Barbara Jackson is here to discuss her progress with her obesity treatment plan along with follow-up of her obesity related diagnoses. Barbara Jackson is on the Category 3 Plan and states she is following her eating plan approximately 25% of the time. Barbara Jackson states she is swimming, walking, and biking for 20 minutes 5 times per week.  Today's visit was #: 3 Starting weight: 242 lbs Starting date: 12/04/2021 Today's weight: 241 lbs Today's date: 01/16/2022 Total lbs lost to date: 1 Total lbs lost since last in-office visit: 1  Interim History: Barbara Jackson is down 1 additional pound since her last visit.  She is trying some new high-protein cereals.  Subjective:   1. Vitamin D insufficiency Barbara Jackson is taking prescription vitamin D.  2. Essential hypertension Barbara Jackson is taking Hyzaar.  Her blood pressure is elevated today.  Assessment/Plan:   1. Vitamin D insufficiency Barbara Jackson will continue prescription vitamin D 50,000 units once weekly, and we will refill for 1 month.  - Vitamin D, Ergocalciferol, (DRISDOL) 1.25 MG (50000 UNIT) CAPS capsule; Take 1 capsule (50,000 Units total) by mouth every 7 (seven) days.  Dispense: 5 capsule; Refill: 0  2. Essential hypertension Barbara Jackson will continue her blood pressure medications and will eliminate added salt.  3. Obesity, Current BMI 40.1 Barbara Jackson is currently in the action stage of change. As such, her goal is to continue with weight loss efforts. She has agreed to the Category 3 Plan.   We will adhere closely to the plan 80-90%.  She will substitute protein bars for chocolate.  Increase vegetables, and we will change her bread to low calorie bread. Increase water.   Exercise goals: As is.   Behavioral modification strategies: increasing lean protein intake, decreasing simple carbohydrates, increasing vegetables, increasing water intake, decreasing eating out, no skipping meals, meal planning and cooking strategies, keeping healthy foods in the  home, and planning for success.  Barbara Jackson has agreed to follow-up with our clinic in 2 to 3 weeks. She was informed of the importance of frequent follow-up visits to maximize her success with intensive lifestyle modifications for her multiple health conditions.   Objective:   Blood pressure 128/84, pulse 96, temperature (!) 97.5 F (36.4 C), height '5\' 5"'$  (1.651 m), weight 241 lb (109.3 kg), last menstrual period 06/16/2005, SpO2 96 %. Body mass index is 40.1 kg/m.  General: Cooperative, alert, well developed, in no acute distress. HEENT: Conjunctivae and lids unremarkable. Cardiovascular: Regular rhythm.  Lungs: Normal work of breathing. Neurologic: No focal deficits.   Lab Results  Component Value Date   CREATININE 0.89 12/04/2021   BUN 16 12/04/2021   NA 139 12/04/2021   K 4.2 12/04/2021   CL 97 12/04/2021   CO2 21 12/04/2021   Lab Results  Component Value Date   ALT 29 12/04/2021   AST 22 12/04/2021   ALKPHOS 66 12/04/2021   BILITOT 0.4 12/04/2021   Lab Results  Component Value Date   HGBA1C 6.6 (H) 12/04/2021   HGBA1C 6.4 (A) 02/11/2021   HGBA1C 6.1 (A) 09/19/2020   HGBA1C 6.3 (A) 05/16/2020   HGBA1C 6.1 (A) 08/23/2019   Lab Results  Component Value Date   INSULIN 11.8 12/04/2021   Lab Results  Component Value Date   TSH 2.320 12/04/2021   Lab Results  Component Value Date   CHOL 174 08/13/2021   HDL 84.60 08/13/2021   LDLDIRECT 33.0 08/13/2021   TRIG 217.0 (H) 08/13/2021   CHOLHDL 2 08/13/2021  Lab Results  Component Value Date   VD25OH 27.6 (L) 12/04/2021   Lab Results  Component Value Date   WBC 12.5 (H) 01/12/2019   HGB 12.2 01/12/2019   HCT 37.3 01/12/2019   MCV 86.5 01/12/2019   PLT 417 (H) 01/12/2019   Lab Results  Component Value Date   FERRITIN 22 12/04/2017   Attestation Statements:   Reviewed by clinician on day of visit: allergies, medications, problem list, medical history, surgical history, family history, social history, and  previous encounter notes.   Wilhemena Durie, am acting as Location manager for [employer], DO.  I have reviewed the above documentation for accuracy and completeness, and I agree with the above. Jearld Lesch, DO

## 2022-01-28 ENCOUNTER — Other Ambulatory Visit (HOSPITAL_COMMUNITY): Payer: Self-pay

## 2022-01-28 ENCOUNTER — Telehealth (INDEPENDENT_AMBULATORY_CARE_PROVIDER_SITE_OTHER): Payer: Medicare PPO | Admitting: Psychology

## 2022-01-28 ENCOUNTER — Telehealth: Payer: Self-pay

## 2022-01-28 DIAGNOSIS — F330 Major depressive disorder, recurrent, mild: Secondary | ICD-10-CM | POA: Diagnosis not present

## 2022-01-28 DIAGNOSIS — F5089 Other specified eating disorder: Secondary | ICD-10-CM | POA: Diagnosis not present

## 2022-01-28 DIAGNOSIS — F411 Generalized anxiety disorder: Secondary | ICD-10-CM | POA: Diagnosis not present

## 2022-01-28 NOTE — Telephone Encounter (Signed)
Patient Advocate Encounter   Received notification that prior authorization is required for Mounjaro 7.5 MG. PA submitted and APPROVED on 01/28/2022.  [facility] Effective: 06/16/2021 - 06/15/2022  Clista Bernhardt, CPhT Rx Patient Advocate Specialist Phone: [phone]

## 2022-01-28 NOTE — Telephone Encounter (Signed)
Patient notified and message sent for PA

## 2022-01-28 NOTE — Telephone Encounter (Signed)
Pt is waiting for a PA for [facility] and is currently overdue for injection. She has some left over Ozempic and wanted to know if she could take that until she is able to get her Darcel Bayley again?

## 2022-01-28 NOTE — Progress Notes (Signed)
  Office: [phone]  /  Fax: [phone]    Date: January 28, 2022    Appointment Start Time: 8:33am Duration: 31 minutes Provider: Glennie Isle, Psy.D. Type of Session: Individual Therapy  Location of Patient: Home (private location) Location of Provider: Provider's Home (private office) Type of Contact: Telepsychological Visit via MyChart Video Visit  Session Content: Barbara Jackson is a 66 y.o. female presenting for a follow-up appointment to address the previously established treatment goal of increasing coping skills.Today's appointment was a telepsychological visit. Barbara Jackson provided verbal consent for today's telepsychological appointment and she is aware she is responsible for securing confidentiality on her end of the session. Prior to proceeding with today's appointment, Barbara Jackson's physical location at the time of this appointment was obtained as well a phone number she could be reached at in the event of technical difficulties. Barbara Jackson and this provider participated in today's telepsychological service.   This provider conducted a brief check-in. [name] shared, "I'm a little discouraged with my progress on the program." She also discussed ongoing worry about her son's well-being and challenges with her sleep. Further explored and processed. Barbara Jackson reported she is staying up later, which results in nighttime eating. She feels she is "addicted to [her] phone." Due to ongoing sleeping difficulties, psychoeducation regarding sleep hygiene was provided. Barbara Jackson provided verbal consent during today's appointment for this provider to send a handout about sleep hygiene via e-mail. Reviewed triggers for emotional eating behaviors. Overall, Barbara Jackson was receptive to today's appointment as evidenced by openness to sharing, responsiveness to feedback, and willingness to implement discussed strategies .  Mental Status Examination:  Appearance: neat Behavior: appropriate to circumstances Mood: anxious Affect: mood  congruent Speech: WNL Eye Contact: appropriate   Psychomotor Activity: WNL Gait: unable to assess Thought Process: no evidence or endorsement of suicidal, homicidal, and self-harm ideation, plan and intent  Thought Content/Perception: no hallucinations, delusions, bizarre thinking or behavior endorsed or observed Orientation: AAOx4 Memory/Concentration: memory, attention, language, and fund of knowledge intact  Insight: fair Judgment: fair  Interventions:  Conducted a brief chart review Provided empathic reflections and validation Employed supportive psychotherapy interventions to facilitate reduced distress and to improve coping skills with identified stressors Psychoeducation provided regarding sleep hygiene Reviewed content from the last appointment  DSM-5 Diagnosis(es):  F50.89 Other Specified Feeding or Eating Disorder, Emotional Eating Behaviors, F41.1 Generalized Anxiety Disorder, and F33.0 Major Depressive Disorder, Recurrent Episode, Mild  Treatment Goal & Progress: During the initial appointment with this provider, the following treatment goal was established: increase coping skills. [name] has demonstrated progress in her goal as evidenced by increased awareness of hunger patterns. [name] also continues to demonstrate willingness to engage in learned skill(s).  Plan: The next appointment is scheduled for 02/11/2022 at 8:30am, which will be via MyChart Video Visit. The next session will focus on working towards the established treatment goal. [name] will continue with her primary therapist.

## 2022-01-29 ENCOUNTER — Encounter (INDEPENDENT_AMBULATORY_CARE_PROVIDER_SITE_OTHER): Payer: Self-pay | Admitting: Bariatrics

## 2022-02-04 ENCOUNTER — Ambulatory Visit (INDEPENDENT_AMBULATORY_CARE_PROVIDER_SITE_OTHER): Payer: Medicare PPO | Admitting: Family Medicine

## 2022-02-10 ENCOUNTER — Ambulatory Visit (INDEPENDENT_AMBULATORY_CARE_PROVIDER_SITE_OTHER): Payer: Medicare PPO | Admitting: Family Medicine

## 2022-02-10 ENCOUNTER — Encounter (INDEPENDENT_AMBULATORY_CARE_PROVIDER_SITE_OTHER): Payer: Self-pay | Admitting: Family Medicine

## 2022-02-10 VITALS — BP 135/82 | HR 70 | Temp 97.6°F | Ht 65.0 in | Wt 242.0 lb

## 2022-02-10 DIAGNOSIS — E1165 Type 2 diabetes mellitus with hyperglycemia: Secondary | ICD-10-CM

## 2022-02-10 DIAGNOSIS — E559 Vitamin D deficiency, unspecified: Secondary | ICD-10-CM

## 2022-02-10 DIAGNOSIS — Z6841 Body Mass Index (BMI) 40.0 and over, adult: Secondary | ICD-10-CM

## 2022-02-10 DIAGNOSIS — F3289 Other specified depressive episodes: Secondary | ICD-10-CM | POA: Diagnosis not present

## 2022-02-10 DIAGNOSIS — Z7985 Long-term (current) use of injectable non-insulin antidiabetic drugs: Secondary | ICD-10-CM

## 2022-02-10 DIAGNOSIS — F32A Depression, unspecified: Secondary | ICD-10-CM | POA: Insufficient documentation

## 2022-02-10 DIAGNOSIS — E669 Obesity, unspecified: Secondary | ICD-10-CM | POA: Diagnosis not present

## 2022-02-10 MED ORDER — VITAMIN D (ERGOCALCIFEROL) 1.25 MG (50000 UNIT) PO CAPS: 50000.0000 [IU] | ORAL_CAPSULE | ORAL | 0 refills | Status: DC

## 2022-02-11 ENCOUNTER — Telehealth (INDEPENDENT_AMBULATORY_CARE_PROVIDER_SITE_OTHER): Payer: Medicare PPO | Admitting: Psychology

## 2022-02-11 DIAGNOSIS — F5089 Other specified eating disorder: Secondary | ICD-10-CM

## 2022-02-11 DIAGNOSIS — F411 Generalized anxiety disorder: Secondary | ICD-10-CM | POA: Diagnosis not present

## 2022-02-11 DIAGNOSIS — F330 Major depressive disorder, recurrent, mild: Secondary | ICD-10-CM | POA: Diagnosis not present

## 2022-02-11 NOTE — Progress Notes (Signed)
  Office: [phone]  /  Fax: [phone]    Date: February 11, 2022    Appointment Start Time: 8:02am Duration: 24 minutes Provider: Glennie Isle, Psy.D. Type of Session: Individual Therapy  Location of Patient: Home (private location) Location of Provider: Provider's Home (private office) Type of Contact: Telepsychological Visit via MyChart Video Visit  Session Content: Barbara Jackson is a 66 y.o. female presenting for a follow-up appointment to address the previously established treatment goal of increasing coping skills.Today's appointment was a telepsychological visit. [name] provided verbal consent for today's telepsychological appointment and she is aware she is responsible for securing confidentiality on her end of the session. Prior to proceeding with today's appointment, [name]'s physical location at the time of this appointment was obtained as well a phone number she could be reached at in the event of technical difficulties. [name] and this provider participated in today's telepsychological service.   This provider conducted a brief check-in. [name] shared about recent sleep habits, noting an improvement. She also shared about recent birthday celebrations. Reviewed triggers for emotional eating behaviors. She continues to experience eating secondary to the following triggers: out of habit and nervousness. [name] was engaged in problem solving to develop a plan to help cope with urges/cravings involving activities to relax, activities to distract, comforting places, people to call and connect with, and activities that help soothe senses. She was observed writing the plan. Overall, [name] was receptive to today's appointment as evidenced by openness to sharing, responsiveness to feedback, and willingness to implement discussed strategies .  Mental Status Examination:  Appearance: neat Behavior: appropriate to circumstances Mood: anxious Affect: mood congruent Speech: WNL Eye Contact:  appropriate Psychomotor Activity: WNL Gait: unable to assess Thought Process: linear, logical, and goal directed and no evidence or endorsement of suicidal, homicidal, and self-harm ideation, plan and intent  Thought Content/Perception: no hallucinations, delusions, bizarre thinking or behavior endorsed or observed Orientation: AAOx4 Memory/Concentration: memory, attention, language, and fund of knowledge intact  Insight: good Judgment: fair  Interventions:  Conducted a brief chart review Provided empathic reflections and validation Reviewed content from the previous session Employed supportive psychotherapy interventions to facilitate reduced distress and to improve coping skills with identified stressors Engaged patient in problem solving  DSM-5 Diagnosis(es):  F50.89 Other Specified Feeding or Eating Disorder, Emotional Eating Behaviors, F41.1 Generalized Anxiety Disorder, and F33.0 Major Depressive Disorder, Recurrent Episode, Mild  Treatment Goal & Progress: During the initial appointment with this provider, the following treatment goal was established: increase coping skills. [name] has demonstrated progress in her goal as evidenced by increased awareness of hunger patterns and increased awareness of triggers for emotional eating behaviors. [name] also continues to demonstrate willingness to engage in learned skill(s).  Plan: Per [name]'s request, the next appointment is scheduled for 03/04/2022 at 8:30am, which will be via MyChart Video Visit. The next session will focus on reviewing the developed plan, and working towards the established treatment goal. [name] will continue with her primary therapist.

## 2022-02-13 ENCOUNTER — Encounter (INDEPENDENT_AMBULATORY_CARE_PROVIDER_SITE_OTHER): Payer: Self-pay | Admitting: Family Medicine

## 2022-02-13 ENCOUNTER — Other Ambulatory Visit: Payer: Self-pay | Admitting: Internal Medicine

## 2022-02-13 ENCOUNTER — Encounter: Payer: Self-pay | Admitting: Internal Medicine

## 2022-02-13 MED ORDER — TIRZEPATIDE 10 MG/0.5ML ~~LOC~~ SOAJ: 10.0000 mg | SUBCUTANEOUS | 11 refills | Status: DC

## 2022-02-17 NOTE — Progress Notes (Signed)
Chief Complaint:   OBESITY Barbara Jackson is here to discuss her progress with her obesity treatment plan along with follow-up of her obesity related diagnoses. Barbara Jackson is on the Category 3 Plan and states she is following her eating plan approximately 20% of the time. Barbara Jackson states she is swimming, walking, and biking 20 minutes 4 times per week.  Today's visit was #: 4 Starting weight: 242 lbs Starting date: 12/04/2021 Today's weight: 242 lbs Today's date: 02/10/2022 Total lbs lost to date: 0 Total lbs lost since last in-office visit: +1  Interim History: This is Barbara Jackson's first OV with me. She was previously seen by Dr. Owens Shark. She states she is not on track and hasn't really committed to the meal plan. Pt gained a pound and hasn't gained or lost since starting.  Subjective:   1. Type 2 diabetes mellitus with hyperglycemia, without long-term current use of insulin (HCC) Pt's fasting blood sugar has been in the 150's recently. She is managed by Dr. Cruzita Lederer at endocrinology, and she recently changed to [facility] a month ago. Pt is unsure if it is helping her.  2. Vitamin D insufficiency She is currently taking prescription vitamin D 50,000 IU each week. She denies nausea, vomiting or muscle weakness.  3. Other depression, emotional eating Barbara Jackson is on Zoloft, Valium, and Ambien for depression and generalized anxiety disorder. Her mood is stable.  Assessment/Plan:  No orders of the defined types were placed in this encounter.   Medications Discontinued During This Encounter  Medication Reason   Semaglutide, 2 MG/DOSE, 8 MG/3ML SOPN    Vitamin D, Ergocalciferol, (DRISDOL) 1.25 MG (50000 UNIT) CAPS capsule Reorder     Meds ordered this encounter  Medications   Vitamin D, Ergocalciferol, (DRISDOL) 1.25 MG (50000 UNIT) CAPS capsule    Sig: Take 1 capsule (50,000 Units total) by mouth every 7 (seven) days.    Dispense:  4 capsule    Refill:  0     1. Type 2 diabetes mellitus with  hyperglycemia, without long-term current use of insulin (HCC) Good blood sugar control is important to decrease the likelihood of diabetic complications such as nephropathy, neuropathy, limb loss, blindness, coronary artery disease, and death. Intensive lifestyle modification including diet, exercise and weight loss are the first line of treatment for diabetes.  Long discussion with pt on how food is medicine;  In addition to all meds per Dr. Cruzita Lederer, we discussed how simple carbs will stimulate hunger/cravings and increasing protein intake will help decrease hunger/cravings.  Continue meds per endocrinology- glucotrol, Metformin, and Mounjaro Fasting blood sugars are not at goal.  2. Vitamin D insufficiency Low Vitamin D level contributes to fatigue and are associated with obesity, breast, and colon cancer. She agrees to continue to take prescription Vitamin D '@50'$ ,000 IU every week and will follow-up for routine testing of Vitamin D, at least 2-3 times per year to avoid over-replacement.  Refill- Vitamin D, Ergocalciferol, (DRISDOL) 1.25 MG (50000 UNIT) CAPS capsule; Take 1 capsule (50,000 Units total) by mouth every 7 (seven) days.  Dispense: 4 capsule; Refill: 0  3. Other depression, emotional eating Behavior modification techniques were discussed today to help Barbara Jackson deal with her emotional/non-hunger eating behaviors.  Orders and follow up as documented in patient record.  Continue meds per PCP. Continue with Dr. Mallie Mussel for emotional eating counseling and help with getting and staying on plan.  4. Obesity, Current BMI 40.3 Barbara Jackson is currently in the action stage of change. As such, her goal  is to continue with weight loss efforts. She has agreed to the Category 3 Plan.   Pt will focus on dinner meal and eating all of it for that meal each day.  Exercise goals:  As is  Behavioral modification strategies: keeping healthy foods in the home, better snacking choices, and avoiding  temptations.  Barbara Jackson has agreed to follow-up with our clinic in 3-4 weeks. She was informed of the importance of frequent follow-up visits to maximize her success with intensive lifestyle modifications for her multiple health conditions.   Objective:   Blood pressure 135/82, pulse 70, temperature 97.6 F (36.4 C), height '5\' 5"'$  (1.651 m), weight 242 lb (109.8 kg), last menstrual period 06/16/2005, SpO2 98 %. Body mass index is 40.27 kg/m.  General: Cooperative, alert, well developed, in no acute distress. HEENT: Conjunctivae and lids unremarkable. Cardiovascular: Regular rhythm.  Lungs: Normal work of breathing. Neurologic: No focal deficits.   Lab Results  Component Value Date   CREATININE 0.89 12/04/2021   BUN 16 12/04/2021   NA 139 12/04/2021   K 4.2 12/04/2021   CL 97 12/04/2021   CO2 21 12/04/2021   Lab Results  Component Value Date   ALT 29 12/04/2021   AST 22 12/04/2021   ALKPHOS 66 12/04/2021   BILITOT 0.4 12/04/2021   Lab Results  Component Value Date   HGBA1C 6.6 (H) 12/04/2021   HGBA1C 6.4 (A) 02/11/2021   HGBA1C 6.1 (A) 09/19/2020   HGBA1C 6.3 (A) 05/16/2020   HGBA1C 6.1 (A) 08/23/2019   Lab Results  Component Value Date   INSULIN 11.8 12/04/2021   Lab Results  Component Value Date   TSH 2.320 12/04/2021   Lab Results  Component Value Date   CHOL 174 08/13/2021   HDL 84.60 08/13/2021   LDLDIRECT 33.0 08/13/2021   TRIG 217.0 (H) 08/13/2021   CHOLHDL 2 08/13/2021   Lab Results  Component Value Date   VD25OH 27.6 (L) 12/04/2021   Lab Results  Component Value Date   WBC 12.5 (H) 01/12/2019   HGB 12.2 01/12/2019   HCT 37.3 01/12/2019   MCV 86.5 01/12/2019   PLT 417 (H) 01/12/2019   Lab Results  Component Value Date   FERRITIN 22 12/04/2017    Obesity Behavioral Intervention:   Approximately 15 minutes were spent on the discussion below.  ASK: We discussed the diagnosis of obesity with Barbara Jackson today and Barbara Jackson agreed to give [place]  permission to discuss obesity behavioral modification therapy today.  ASSESS: Barbara Jackson has the diagnosis of obesity and her BMI today is 40.3. Barbara Jackson is in the action stage of change.   ADVISE: Barbara Jackson was educated on the multiple health risks of obesity as well as the benefit of weight loss to improve her health. She was advised of the need for long term treatment and the importance of lifestyle modifications to improve her current health and to decrease her risk of future health problems.  AGREE: Multiple dietary modification options and treatment options were discussed and Barbara Jackson agreed to follow the recommendations documented in the above note.  ARRANGE: Barbara Jackson was educated on the importance of frequent visits to treat obesity as outlined per CMS and USPSTF guidelines and agreed to schedule her next follow up appointment today.  Attestation Statements:   Reviewed by clinician on day of visit: allergies, medications, problem list, medical history, surgical history, family history, social history, and previous encounter notes.  I, Kathlene November, BS, CMA, am acting as transcriptionist for [employer], DO.  I have reviewed the above documentation for accuracy and completeness, and I agree with the above. Marjory Sneddon, D.O.  The [place] was signed into law in 2016 which includes the topic of electronic health records.  This provides immediate access to information in MyChart.  This includes consultation notes, operative notes, office notes, lab results and pathology reports.  If you have any questions about what you read please let us know at your next visit so we can discuss your concerns and take corrective action if need be.  We are right here with you.

## 2022-03-04 ENCOUNTER — Telehealth (INDEPENDENT_AMBULATORY_CARE_PROVIDER_SITE_OTHER): Payer: Medicare PPO | Admitting: Psychology

## 2022-03-04 DIAGNOSIS — F5089 Other specified eating disorder: Secondary | ICD-10-CM

## 2022-03-04 DIAGNOSIS — F330 Major depressive disorder, recurrent, mild: Secondary | ICD-10-CM

## 2022-03-04 DIAGNOSIS — F411 Generalized anxiety disorder: Secondary | ICD-10-CM | POA: Diagnosis not present

## 2022-03-04 NOTE — Progress Notes (Signed)
  Office: [phone]  /  Fax: [phone]    Date: March 04, 2022    Appointment Start Time: 8:32am Duration: 28 minutes Provider: Glennie Isle, Psy.D. Type of Session: Individual Therapy  Location of Patient: Home (private location) Location of Provider: Provider's Home (private office) Type of Contact: Telepsychological Visit via MyChart Video Visit  Session Content: Barbara Jackson is a 66 y.o. female presenting for a follow-up appointment to address the previously established treatment goal of increasing coping skills.Today's appointment was a telepsychological visit. Barbara Jackson provided verbal consent for today's telepsychological appointment and she is aware she is responsible for securing confidentiality on her end of the session. Prior to proceeding with today's appointment, Barbara Jackson's physical location at the time of this appointment was obtained as well a phone number she could be reached at in the event of technical difficulties. Barbara Jackson and this provider participated in today's telepsychological service.   This provider conducted a brief check-in. Barbara Jackson shared the last few weeks have "not [been] the best." She indicated ongoing worry about her son's well-being, adding a plan to go visit him. Regarding eating habits, she indicated challenges eating congruent to her structured meal plan. Further explored and processed. Psychoeducation regarding SMART goals was provided and Barbara Jackson was engaged in goal setting. The following goal was established: Barbara Jackson will consume the dinner portion of her structured meal plan at least 3 out of 7 days between now and her appointment with Barbara Jackson on September 26th. Overall, Barbara Jackson was receptive to today's appointment as evidenced by openness to sharing, responsiveness to feedback, and  willingness to work toward the established SMART goal .  Mental Status Examination:  Appearance: neat Behavior: appropriate to circumstances Mood: anxious Affect: mood  congruent Speech: WNL Eye Contact: appropriate Psychomotor Activity: WNL Gait: unable to assess Thought Process: linear, logical, and goal directed and no evidence or endorsement of suicidal, homicidal, and self-harm ideation, plan and intent  Thought Content/Perception: no hallucinations, delusions, bizarre thinking or behavior endorsed or observed Orientation: AAOx4 Memory/Concentration: memory, attention, language, and fund of knowledge intact  Insight: good Judgment: fair  Interventions:  Conducted a brief chart review Provided empathic reflections and validation Reviewed content from the previous session Provided positive reinforcement Employed supportive psychotherapy interventions to facilitate reduced distress and to improve coping skills with identified stressors Engaged patient in goal setting Psychoeducation provided regarding SMART goals  DSM-5 Diagnosis(es):  F50.89 Other Specified Feeding or Eating Disorder, Emotional Eating Behaviors, F41.1 Generalized Anxiety Disorder, and F33.0 Major Depressive Disorder, Recurrent Episode, Mild  Treatment Goal & Progress: During the initial appointment with this provider, the following treatment goal was established: increase coping skills. Barbara Jackson has demonstrated progress in her goal as evidenced by increased awareness of hunger patterns and increased awareness of triggers for emotional eating behaviors. Barbara Jackson also continues to demonstrate willingness to engage in learned skill(s) and work toward established SMART goal.   Plan: Per Barbara Jackson's request, the next appointment is scheduled for 03/25/2022 at 8:30am, which will be via MyChart Video Visit. The next session will focus on working towards the established treatment goal and reviewing the established SMART goal . Barbara Jackson will continue meeting with her primary therapist. Their next appointment is tomorrow.

## 2022-03-05 DIAGNOSIS — F411 Generalized anxiety disorder: Secondary | ICD-10-CM | POA: Diagnosis not present

## 2022-03-06 DIAGNOSIS — E1165 Type 2 diabetes mellitus with hyperglycemia: Secondary | ICD-10-CM | POA: Diagnosis not present

## 2022-03-06 DIAGNOSIS — E7800 Pure hypercholesterolemia, unspecified: Secondary | ICD-10-CM | POA: Diagnosis not present

## 2022-03-06 DIAGNOSIS — I10 Essential (primary) hypertension: Secondary | ICD-10-CM | POA: Diagnosis not present

## 2022-03-06 DIAGNOSIS — F411 Generalized anxiety disorder: Secondary | ICD-10-CM | POA: Diagnosis not present

## 2022-03-06 DIAGNOSIS — Z23 Encounter for immunization: Secondary | ICD-10-CM | POA: Diagnosis not present

## 2022-03-06 DIAGNOSIS — R790 Abnormal level of blood mineral: Secondary | ICD-10-CM | POA: Diagnosis not present

## 2022-03-11 ENCOUNTER — Ambulatory Visit (INDEPENDENT_AMBULATORY_CARE_PROVIDER_SITE_OTHER): Payer: Medicare PPO | Admitting: Family Medicine

## 2022-03-11 ENCOUNTER — Encounter (INDEPENDENT_AMBULATORY_CARE_PROVIDER_SITE_OTHER): Payer: Self-pay | Admitting: Family Medicine

## 2022-03-11 VITALS — BP 134/85 | HR 97 | Temp 98.4°F | Ht 65.0 in | Wt 239.0 lb

## 2022-03-11 DIAGNOSIS — E1169 Type 2 diabetes mellitus with other specified complication: Secondary | ICD-10-CM | POA: Diagnosis not present

## 2022-03-11 DIAGNOSIS — E119 Type 2 diabetes mellitus without complications: Secondary | ICD-10-CM | POA: Insufficient documentation

## 2022-03-11 DIAGNOSIS — E559 Vitamin D deficiency, unspecified: Secondary | ICD-10-CM

## 2022-03-11 DIAGNOSIS — Z6839 Body mass index (BMI) 39.0-39.9, adult: Secondary | ICD-10-CM

## 2022-03-11 DIAGNOSIS — Z7984 Long term (current) use of oral hypoglycemic drugs: Secondary | ICD-10-CM | POA: Diagnosis not present

## 2022-03-11 DIAGNOSIS — E669 Obesity, unspecified: Secondary | ICD-10-CM | POA: Diagnosis not present

## 2022-03-12 ENCOUNTER — Encounter (INDEPENDENT_AMBULATORY_CARE_PROVIDER_SITE_OTHER): Payer: Self-pay | Admitting: Family Medicine

## 2022-03-13 NOTE — Progress Notes (Addendum)
Chief Complaint:   OBESITY Barbara Jackson is here to discuss her progress with her obesity treatment plan along with follow-up of her obesity related diagnoses. Barbara Jackson is on the Category 3 Plan and states she is following her eating plan approximately 20% of the time. Barbara Jackson states she is walking, swimming, and riding the stationary bike for 20 minutes 4 times per week.  Today's visit was #: 5 Starting weight: 242 lbs Starting date: 12/04/2021 Today's weight: 239 lbs Today's date: 03/11/2022 Total lbs lost to date: 3 Total lbs lost since last in-office visit: 3  Interim History: Barbara Jackson reports focusing mainly on several dinner meals each week. She notes difficulty with meeting protein goals, as she doesn't like all the meat. She is willing to try the vegetarian plan. She has no side effects with GLP-1.   Subjective:   1. Type 2 diabetes mellitus with other specified complication, without long-term current use of insulin (HCC) Barbara Jackson is followed by Endocrinology. She is taking glipizide, metformin, and Mounjaro. No side effects were noted. She denies lows or hypoglycemia.   2. Vitamin D insufficiency Barbara Jackson is tolerating Vitamin D with no side effects.  Assessment/Plan:   1. Type 2 diabetes mellitus with other specified complication, without long-term current use of insulin (HCC) Barbara Jackson will continue to follow up with Endocrinology. She will continue her medications, diet, and exercise.   2. Vitamin D insufficiency Barbara Jackson will continue Vitamin D, diet, and exercise.   3. Obesity, Current BMI 39.8 Barbara Jackson is currently in the action stage of change. As such, her goal is to continue with weight loss efforts. She has agreed to change to the [place] with sandwich substitution.   Encouragement was provided. Discussed getting majority of protein from real foods.   Exercise goals: As is.   Behavioral modification strategies: increasing lean protein intake, decreasing simple carbohydrates,  and meal planning and cooking strategies.  Barbara Jackson has agreed to follow-up with our clinic in 3 weeks. She was informed of the importance of frequent follow-up visits to maximize her success with intensive lifestyle modifications for her multiple health conditions.   Objective:   Blood pressure 134/85, pulse 97, temperature 98.4 F (36.9 C), height '5\' 5"'$  (1.651 m), weight 239 lb (108.4 kg), last menstrual period 06/16/2005, SpO2 96 %. Body mass index is 39.77 kg/m.  General: Cooperative, alert, well developed, in no acute distress. HEENT: Conjunctivae and lids unremarkable. Cardiovascular: Regular rhythm.  Lungs: Normal work of breathing. Neurologic: No focal deficits.   Lab Results  Component Value Date   CREATININE 0.89 12/04/2021   BUN 16 12/04/2021   NA 139 12/04/2021   K 4.2 12/04/2021   CL 97 12/04/2021   CO2 21 12/04/2021   Lab Results  Component Value Date   ALT 29 12/04/2021   AST 22 12/04/2021   ALKPHOS 66 12/04/2021   BILITOT 0.4 12/04/2021   Lab Results  Component Value Date   HGBA1C 6.6 (H) 12/04/2021   HGBA1C 6.4 (A) 02/11/2021   HGBA1C 6.1 (A) 09/19/2020   HGBA1C 6.3 (A) 05/16/2020   HGBA1C 6.1 (A) 08/23/2019   Lab Results  Component Value Date   INSULIN 11.8 12/04/2021   Lab Results  Component Value Date   TSH 2.320 12/04/2021   Lab Results  Component Value Date   CHOL 174 08/13/2021   HDL 84.60 08/13/2021   LDLDIRECT 33.0 08/13/2021   TRIG 217.0 (H) 08/13/2021   CHOLHDL 2 08/13/2021   Lab Results  Component Value Date  VD25OH 27.6 (L) 12/04/2021   Lab Results  Component Value Date   WBC 12.5 (H) 01/12/2019   HGB 12.2 01/12/2019   HCT 37.3 01/12/2019   MCV 86.5 01/12/2019   PLT 417 (H) 01/12/2019   Lab Results  Component Value Date   FERRITIN 22 12/04/2017   Attestation Statements:   Reviewed by clinician on day of visit: allergies, medications, problem list, medical history, surgical history, family history, social history,  and previous encounter notes.  Time spent on visit including pre-visit chart review and post-visit care and charting was 42 minutes.   I, Trixie Dredge, am acting as transcriptionist for Dennard Nip, MD.  I have reviewed the above documentation for accuracy and completeness, and I agree with the above. -  Dennard Nip, MD

## 2022-03-14 DIAGNOSIS — M25552 Pain in left hip: Secondary | ICD-10-CM | POA: Diagnosis not present

## 2022-03-14 DIAGNOSIS — M546 Pain in thoracic spine: Secondary | ICD-10-CM | POA: Diagnosis not present

## 2022-03-14 DIAGNOSIS — R2689 Other abnormalities of gait and mobility: Secondary | ICD-10-CM | POA: Diagnosis not present

## 2022-03-17 ENCOUNTER — Other Ambulatory Visit (INDEPENDENT_AMBULATORY_CARE_PROVIDER_SITE_OTHER): Payer: Self-pay | Admitting: Family Medicine

## 2022-03-17 DIAGNOSIS — F411 Generalized anxiety disorder: Secondary | ICD-10-CM | POA: Diagnosis not present

## 2022-03-17 DIAGNOSIS — E559 Vitamin D deficiency, unspecified: Secondary | ICD-10-CM

## 2022-03-20 DIAGNOSIS — M546 Pain in thoracic spine: Secondary | ICD-10-CM | POA: Diagnosis not present

## 2022-03-20 DIAGNOSIS — R2689 Other abnormalities of gait and mobility: Secondary | ICD-10-CM | POA: Diagnosis not present

## 2022-03-20 DIAGNOSIS — M25552 Pain in left hip: Secondary | ICD-10-CM | POA: Diagnosis not present

## 2022-03-25 ENCOUNTER — Telehealth (INDEPENDENT_AMBULATORY_CARE_PROVIDER_SITE_OTHER): Payer: Medicare PPO | Admitting: Psychology

## 2022-03-25 DIAGNOSIS — R2689 Other abnormalities of gait and mobility: Secondary | ICD-10-CM | POA: Diagnosis not present

## 2022-03-25 DIAGNOSIS — F330 Major depressive disorder, recurrent, mild: Secondary | ICD-10-CM

## 2022-03-25 DIAGNOSIS — F411 Generalized anxiety disorder: Secondary | ICD-10-CM

## 2022-03-25 DIAGNOSIS — F5089 Other specified eating disorder: Secondary | ICD-10-CM | POA: Diagnosis not present

## 2022-03-25 DIAGNOSIS — M25552 Pain in left hip: Secondary | ICD-10-CM | POA: Diagnosis not present

## 2022-03-25 DIAGNOSIS — M546 Pain in thoracic spine: Secondary | ICD-10-CM | POA: Diagnosis not present

## 2022-03-25 NOTE — Progress Notes (Signed)
  Office: [phone]  /  Fax: [phone]    Date: March 25, 2022    Appointment Start Time: 8:36am Duration: 30 minutes Provider: Glennie Isle, Psy.D. Type of Session: Individual Therapy  Location of Patient: Home (private location) Location of Provider: Provider's Home (private office) Type of Contact: Telepsychological Visit via MyChart Video Visit  Session Content: Barbara Jackson is a 66 y.o. female presenting for a follow-up appointment to address the previously established treatment goal of increasing coping skills.Today's appointment was a telepsychological visit. Barbara Jackson provided verbal consent for today's telepsychological appointment and she is aware she is responsible for securing confidentiality on her end of the session. Prior to proceeding with today's appointment, Barbara Jackson's physical location at the time of this appointment was obtained as well a phone number she could be reached at in the event of technical difficulties. Barbara Jackson and this provider participated in today's telepsychological service.   This provider conducted a brief check-in. Barbara Jackson shared about recent events, including her working toward the previously established SMART goal and switching to the vegetarian structured meal plan during her last appointment with [name]. Associated thoughts and feelings related to the change were processed. Moreover, Barbara Jackson discussed she continues to experience cravings, especially chocolate. Psychoeducation provided regarding self-compassion to assist with coping, especially when deviations occur from the prescribed meal plan. Barbara Jackson was engaged in a self-compassion exercise to help with eating-related challenges and other ongoing stressors. She was encouraged to regularly ask herself, "What do I need right now?" Overall, Barbara Jackson was receptive to today's appointment as evidenced by openness to sharing, responsiveness to feedback, and willingness to work toward increasing self-compassion.  Mental  Status Examination:  Appearance: neat Behavior: appropriate to circumstances Mood: anxious Affect: mood congruent Speech: WNL Eye Contact: appropriate Psychomotor Activity: WNL Gait: unable to assess Thought Process: linear, logical, and goal directed and no evidence or endorsement of suicidal, homicidal, and self-harm ideation, plan and intent  Thought Content/Perception: no hallucinations, delusions, bizarre thinking or behavior endorsed or observed Orientation: AAOx4 Memory/Concentration: memory, attention, language, and fund of knowledge intact  Insight: fair Judgment: fair  Interventions:  Conducted a brief chart review Provided empathic reflections and validation Employed supportive psychotherapy interventions to facilitate reduced distress and to improve coping skills with identified stressors Psychoeducation provided regarding self-compassion Engaged pt in a self-compassion exercise  DSM-5 Diagnosis(es):  F50.89 Other Specified Feeding or Eating Disorder, Emotional Eating Behaviors, F41.1 Generalized Anxiety Disorder, and F33.0 Major Depressive Disorder, Recurrent Episode, Mild  Treatment Goal & Progress: During the initial appointment with this provider, the following treatment goal was established: increase coping skills. Barbara Jackson has demonstrated progress in her goal as evidenced by increased awareness of hunger patterns and increased awareness of triggers for emotional eating behaviors. Barbara Jackson also continues to demonstrate willingness to engage in learned skill(s).  Plan: Due to her son's upcoming visit, the next appointment is scheduled for 04/14/2022 at 10am, which will be via MyChart Video Visit. The next session will focus on working towards the established treatment goal. Barbara Jackson will continue with her primary therapist.

## 2022-03-27 ENCOUNTER — Encounter (INDEPENDENT_AMBULATORY_CARE_PROVIDER_SITE_OTHER): Payer: Self-pay | Admitting: Family Medicine

## 2022-04-01 ENCOUNTER — Other Ambulatory Visit (INDEPENDENT_AMBULATORY_CARE_PROVIDER_SITE_OTHER): Payer: Self-pay | Admitting: Family Medicine

## 2022-04-01 DIAGNOSIS — E559 Vitamin D deficiency, unspecified: Secondary | ICD-10-CM

## 2022-04-10 ENCOUNTER — Encounter (INDEPENDENT_AMBULATORY_CARE_PROVIDER_SITE_OTHER): Payer: Self-pay | Admitting: Family Medicine

## 2022-04-10 ENCOUNTER — Ambulatory Visit (INDEPENDENT_AMBULATORY_CARE_PROVIDER_SITE_OTHER): Payer: Medicare PPO | Admitting: Family Medicine

## 2022-04-14 ENCOUNTER — Telehealth (INDEPENDENT_AMBULATORY_CARE_PROVIDER_SITE_OTHER): Payer: Medicare PPO | Admitting: Psychology

## 2022-04-14 DIAGNOSIS — F330 Major depressive disorder, recurrent, mild: Secondary | ICD-10-CM | POA: Diagnosis not present

## 2022-04-14 DIAGNOSIS — F5089 Other specified eating disorder: Secondary | ICD-10-CM

## 2022-04-14 DIAGNOSIS — F411 Generalized anxiety disorder: Secondary | ICD-10-CM | POA: Diagnosis not present

## 2022-04-14 NOTE — Progress Notes (Signed)
  Office: [phone]  /  Fax: [phone]    Date: April 14, 2022    Appointment Start Time: 10:01am Duration: 25 minutes Provider: Glennie Isle, Psy.D. Type of Session: Individual Therapy  Location of Patient: Home (private location) Location of Provider: Provider's Home (private office) Type of Contact: Telepsychological Visit via MyChart Video Visit  Session Content: Barbara Jackson is a 66 y.o. female presenting for a follow-up appointment to address the previously established treatment goal of increasing coping skills.Today's appointment was a telepsychological visit. [name] provided verbal consent for today's telepsychological appointment and she is aware she is responsible for securing confidentiality on her end of the session. Prior to proceeding with today's appointment, Barbara Jackson's physical location at the time of this appointment was obtained as well a phone number she could be reached at in the event of technical difficulties. Barbara Jackson and this provider participated in today's telepsychological service. Of note, today's appointment was switched to a regular telephone call at 10:24am with Chevy's verbal consent due to technical issues on her end.  This provider conducted a brief check-in. Barbara Jackson shared about recent events, including her son visiting and a trip to [place]. Explored recent eating habits. She described enjoying the vegetarian structured meal plan. Additionally, Alise stated her cravings for chocolate have not "really changed." Further explored and processed. Jaxyn stated her chocolate consumption is secondary to out of habit eating after dinner and when feeling anxious. As such, she was engaged in problem solving to help her cope with emotional eating behaviors in the evenings. She agreed to discuss anxiety-related concerns further with her primary therapist. Overall, Barbara Jackson was receptive to today's appointment as evidenced by openness to sharing, responsiveness to feedback, and  willingness to implement discussed strategies .  Mental Status Examination:  Appearance: neat Behavior: appropriate to circumstances Mood: anxious Affect: mood congruent Speech: WNL Eye Contact: appropriate Psychomotor Activity: WNL Gait: unable to assess Thought Process: linear, logical, and goal directed and no evidence or endorsement of suicidal, homicidal, and self-harm ideation, plan and intent  Thought Content/Perception: no hallucinations, delusions, bizarre thinking or behavior endorsed or observed Orientation: AAOx4 Memory/Concentration: memory, attention, language, and fund of knowledge intact  Insight: good Judgment: fair  Interventions:  Conducted a brief chart review Provided empathic reflections and validation Employed supportive psychotherapy interventions to facilitate reduced distress and to improve coping skills with identified stressors Engaged patient in problem solving  DSM-5 Diagnosis(es):  F50.89 Other Specified Feeding or Eating Disorder, Emotional Eating Behaviors, F41.1 Generalized Anxiety Disorder, and F33.0 Major Depressive Disorder, Recurrent Episode, Mild  Treatment Goal & Progress: During the initial appointment with this provider, the following treatment goal was established: increase coping skills. Barbara Jackson has demonstrated progress in her goal as evidenced by increased awareness of hunger patterns and increased awareness of triggers for emotional eating behaviors. Barbara Jackson also continues to demonstrate willingness to engage in learned skill(s).  Plan: The next appointment is scheduled for 05/05/2022 at 11am, which will be via MyChart Video Visit. The next session will focus on working towards the established treatment goal. Barbara Jackson will continue with her primary therapist.

## 2022-04-15 ENCOUNTER — Ambulatory Visit (INDEPENDENT_AMBULATORY_CARE_PROVIDER_SITE_OTHER): Payer: Medicare PPO | Admitting: Family Medicine

## 2022-04-15 ENCOUNTER — Encounter (INDEPENDENT_AMBULATORY_CARE_PROVIDER_SITE_OTHER): Payer: Self-pay | Admitting: Family Medicine

## 2022-04-15 VITALS — BP 123/71 | HR 75 | Temp 97.6°F | Ht 65.0 in | Wt 234.0 lb

## 2022-04-15 DIAGNOSIS — Z7985 Long-term (current) use of injectable non-insulin antidiabetic drugs: Secondary | ICD-10-CM | POA: Diagnosis not present

## 2022-04-15 DIAGNOSIS — Z7984 Long term (current) use of oral hypoglycemic drugs: Secondary | ICD-10-CM | POA: Diagnosis not present

## 2022-04-15 DIAGNOSIS — F3289 Other specified depressive episodes: Secondary | ICD-10-CM | POA: Diagnosis not present

## 2022-04-15 DIAGNOSIS — E559 Vitamin D deficiency, unspecified: Secondary | ICD-10-CM | POA: Diagnosis not present

## 2022-04-15 DIAGNOSIS — E669 Obesity, unspecified: Secondary | ICD-10-CM | POA: Diagnosis not present

## 2022-04-15 DIAGNOSIS — E1169 Type 2 diabetes mellitus with other specified complication: Secondary | ICD-10-CM | POA: Diagnosis not present

## 2022-04-15 DIAGNOSIS — Z6839 Body mass index (BMI) 39.0-39.9, adult: Secondary | ICD-10-CM | POA: Diagnosis not present

## 2022-04-15 MED ORDER — VITAMIN D (ERGOCALCIFEROL) 1.25 MG (50000 UNIT) PO CAPS: 50000.0000 [IU] | ORAL_CAPSULE | ORAL | 0 refills | Status: DC

## 2022-04-16 DIAGNOSIS — F411 Generalized anxiety disorder: Secondary | ICD-10-CM | POA: Diagnosis not present

## 2022-04-22 NOTE — Progress Notes (Unsigned)
Chief Complaint:   OBESITY Polly is here to discuss her progress with her obesity treatment plan along with follow-up of her obesity related diagnoses. Forest is on {MWMwtlossportion/plan2:23431} and states she is following her eating plan approximately ***% of the time. Sheril states she is *** *** minutes *** times per week.  Today's visit was #: *** Starting weight: *** Starting date: *** Today's weight: *** Today's date: 04/15/2022 Total lbs lost to date: *** Total lbs lost since last in-office visit: ***  Interim History: ***  Subjective:   1. Type 2 diabetes mellitus with other specified complication, without long-term current use of insulin (HCC) ***  2. Vitamin D insufficiency ***  3. Other depression, emotional earing ***  Assessment/Plan:   1. Type 2 diabetes mellitus with other specified complication, without long-term current use of insulin (HCC) ***  2. Vitamin D insufficiency *** - Vitamin D, Ergocalciferol, (DRISDOL) 1.25 MG (50000 UNIT) CAPS capsule; Take 1 capsule (50,000 Units total) by mouth every 7 (seven) days.  Dispense: 4 capsule; Refill: 0  3. Other depression, emotional earing ***  4. Obesity, Current BMI 39.1 Yamna is currently in the action stage of change. As such, her goal is to continue with weight loss efforts. She has agreed to the Category 3 Plan and the Metaline.   Handouts were provided and discussed for more breakfast options.   Exercise goals: As is.   Behavioral modification strategies: increasing lean protein intake, decreasing simple carbohydrates, meal planning and cooking strategies, and emotional eating strategies.  Tailyn has agreed to follow-up with our clinic in 3 to 4 weeks. She was informed of the importance of frequent follow-up visits to maximize her success with intensive lifestyle modifications for her multiple health conditions.   Objective:   Blood pressure 123/71, pulse 75, temperature 97.6 F (36.4  C), height '5\' 5"'$  (1.651 m), weight 234 lb (106.1 kg), last menstrual period 06/16/2005, SpO2 98 %. Body mass index is 38.94 kg/m.  General: Cooperative, alert, well developed, in no acute distress. HEENT: Conjunctivae and lids unremarkable. Cardiovascular: Regular rhythm.  Lungs: Normal work of breathing. Neurologic: No focal deficits.   Lab Results  Component Value Date   CREATININE 0.89 12/04/2021   BUN 16 12/04/2021   NA 139 12/04/2021   K 4.2 12/04/2021   CL 97 12/04/2021   CO2 21 12/04/2021   Lab Results  Component Value Date   ALT 29 12/04/2021   AST 22 12/04/2021   ALKPHOS 66 12/04/2021   BILITOT 0.4 12/04/2021   Lab Results  Component Value Date   HGBA1C 6.6 (H) 12/04/2021   HGBA1C 6.4 (A) 02/11/2021   HGBA1C 6.1 (A) 09/19/2020   HGBA1C 6.3 (A) 05/16/2020   HGBA1C 6.1 (A) 08/23/2019   Lab Results  Component Value Date   INSULIN 11.8 12/04/2021   Lab Results  Component Value Date   TSH 2.320 12/04/2021   Lab Results  Component Value Date   CHOL 174 08/13/2021   HDL 84.60 08/13/2021   LDLDIRECT 33.0 08/13/2021   TRIG 217.0 (H) 08/13/2021   CHOLHDL 2 08/13/2021   Lab Results  Component Value Date   VD25OH 27.6 (L) 12/04/2021   Lab Results  Component Value Date   WBC 12.5 (H) 01/12/2019   HGB 12.2 01/12/2019   HCT 37.3 01/12/2019   MCV 86.5 01/12/2019   PLT 417 (H) 01/12/2019   Lab Results  Component Value Date   FERRITIN 22 12/04/2017   Attestation Statements:  Reviewed by clinician on day of visit: allergies, medications, problem list, medical history, surgical history, family history, social history, and previous encounter notes.   I, Trixie Dredge, am acting as transcriptionist for Dennard Nip, MD.  I have reviewed the above documentation for accuracy and completeness, and I agree with the above. -  ***

## 2022-04-28 ENCOUNTER — Encounter: Payer: Self-pay | Admitting: Internal Medicine

## 2022-04-28 ENCOUNTER — Ambulatory Visit: Payer: Medicare PPO | Admitting: Internal Medicine

## 2022-04-28 VITALS — BP 128/72 | HR 81 | Ht 65.0 in | Wt 239.8 lb

## 2022-04-28 DIAGNOSIS — E785 Hyperlipidemia, unspecified: Secondary | ICD-10-CM | POA: Diagnosis not present

## 2022-04-28 DIAGNOSIS — E669 Obesity, unspecified: Secondary | ICD-10-CM | POA: Diagnosis not present

## 2022-04-28 DIAGNOSIS — E1165 Type 2 diabetes mellitus with hyperglycemia: Secondary | ICD-10-CM | POA: Diagnosis not present

## 2022-04-28 LAB — POCT GLYCOSYLATED HEMOGLOBIN (HGB A1C): Hemoglobin A1C: 6.3 % — AB (ref 4.0–5.6)

## 2022-04-28 MED ORDER — TIRZEPATIDE 12.5 MG/0.5ML ~~LOC~~ SOAJ: 12.5000 mg | SUBCUTANEOUS | 3 refills | Status: DC

## 2022-04-28 MED ORDER — METFORMIN HCL ER 500 MG PO TB24: ORAL_TABLET | ORAL | 3 refills | Status: DC

## 2022-04-28 NOTE — Patient Instructions (Addendum)
Please continue: - Metformin ER 2000 mg after dinner - Glipizide ER 5 mg before b'fast (after the Holidays, try to stop this)  Try to increase: - Mounjaro 12.5 mg weekly  Please return in 4 months with your sugar log

## 2022-04-28 NOTE — Progress Notes (Signed)
Patient ID: Barbara Jackson, female   DOB: [birth year], 66 y.o.   MRN: [id number]   HPI: Barbara Jackson is a 66 y.o.-year-old female, returning for follow-up for DM2, dx in 2007, non-insulin-dependent, now more controlled, without long-term complications.  Last visit 5 months ago.  Interim history: No increased urination, blurry vision, nausea, chest pain.   He continues to see the weight management clinic.  She also sees the clinic psychologist. She was able to start [facility] - does not have GI sxs. No decrease in appetite, either.  Reviewed HbA1c levels: Lab Results  Component Value Date   HGBA1C 6.6 (H) 12/04/2021   HGBA1C 6.4 (A) 02/11/2021   HGBA1C 6.1 (A) 09/19/2020   HGBA1C 6.3 (A) 05/16/2020   HGBA1C 6.1 (A) 08/23/2019   HGBA1C 6.4 (A) 04/25/2019   HGBA1C 6.2 (A) 12/20/2018   HGBA1C 6.5 (A) 08/17/2018   HGBA1C 6.1 (A) 05/03/2018  08/13/2021: HbA1c 6.9% 09/19/2020: HbA1c 6.1% 07/25/2020: HbA1c 6.4% 11/25/2019: HbA1c 6.6% 02/22/2018: HbA1c 7.0% 07/2017: HbA1c 7.7% 07/10/2016: HbA1c 7.0%  Pt is on a regimen of: - Metformin ER 2000 mg after dinner - Glipizide XL 10 >> 5-10 mg before breakfast  >> 5 mg in am - Ozempic 0.5 >> 1 >> 2 mg weekly in a.m. >> Mounjaro 7.5 >> 10 mg weekly We stopped Actos 08/17/2018. She tried Januvia, but stopped last year when she was on a new diet at that time and did not want to make any changes in her medicines.  Pt checks her sugars  0 to once a day per review of her log: - am:  120-144, 153 >> 124-147 >> 125-174, 201 (forgot meds) - 2h after b'fast: 112-151, 196 >> 105-175>> 149-160 - before lunch: 127, 130 >> 101-145, 163 >> 98-147 >> 115-130 - 2h after lunch:  117-147 >> 119-197, 234 >> 123-176, 194 - before dinner:  80-160, 243 >> 107-149, 190 >> 128, 148 - 2h after dinner:  141-196 >> 125-200 >> n/c - bedtime:  130-166  >> 144-190 >> 120-162>> 111 - nighttime: n/c >> 120, 133 >> n/c >> 131 Lowest sugar was  92 >> 80>> 111; she has  hypoglycemia awareness in the 70s. Highest sugar was 196 (dessert) >> 200 >> 201.  Glucometer: One Touch Ultra 2  In the past, she read Dr. Janene Jackson program for reversing diabetes program started to adopt the changes suggested in the log.  Her sugars started to improve significantly.   She also saw Barbara Jackson with nutrition in the past.  She was being seen in the binge eating clinic at [facility].  However, not recently.  -No CKD, last BUN/creatinine:  Lab Results  Component Value Date   BUN 16 12/04/2021   BUN 19 08/13/2021   CREATININE 0.89 12/04/2021   CREATININE 0.80 08/13/2021  02/22/2018: 17/0.88, GFR 65, glucose 136, with the rest of the CMP normal; ACR 6.4 07/10/2016: 20/0.83 On losartan 100.  -+ HL;  last set of lipids: Lab Results  Component Value Date   CHOL 174 08/13/2021   HDL 84.60 08/13/2021   LDLDIRECT 33.0 08/13/2021   TRIG 217.0 (H) 08/13/2021   CHOLHDL 2 08/13/2021  07/25/2020: 145/161/83/38 06/12/2019: 170/137/76/71 02/22/2018: 139/109/71/46 07/10/2016: 164/122/84/32  On Lipitor 40.  - last eye exam was 11/21/2021: No DR reportedly; glaucoma suspect [facility], Dr. Gershon Jackson).   - no numbness and tingling in her feet.  She sees podiatry-Dr. Paulla Jackson.  Last foot exam 07/2021.  Pt has FH of DM in  MGM.  She also has depression-on Cymbalta, HTN, frequent UTIs and pelvic floor pain.  Previously on treatment of pain chronically. She saw several specialists but no clear diagnosis was made.  Symptoms resolved ~10/2020. She is on iron -it consistently.  ROS: + see HPI  I reviewed pt's medications, allergies, PMH, social hx, family hx, and changes were documented in the history of present illness. Otherwise, unchanged from my initial visit note.  Past Medical History:  Diagnosis Date   Abnormal pap 1998   Negative Hpv   Anxiety    Arthritis    Barbara Jackson lesion, chronic    Diabetes mellitus    Dysrhythmia    hx of atrial tachycardia and pacs    Esophageal reflux    H/O hiatal hernia    H/O iron deficiency anemia 08/2016   Hypercholesteremia    Hypertension    Obesity    compulsive overeater   Osteoarthritis    Past Surgical History:  Procedure Laterality Date   APPENDECTOMY  1975   TOTAL KNEE ARTHROPLASTY  01/12/2012   Procedure: TOTAL KNEE ARTHROPLASTY;  Surgeon: Gearlean Alf, MD;  Location: [facility];  Service: Orthopedics;  Laterality: Left;   TOTAL KNEE ARTHROPLASTY Right 01/10/2019   Procedure: TOTAL KNEE ARTHROPLASTY;  Surgeon: Gaynelle Arabian, MD;  Location: [facility];  Service: Orthopedics;  Laterality: Right;  84mn   Social History   Socioeconomic History   Marital status: Divorced    Spouse name: Not on file   Number of children: 1   Years of education: Not on file   Highest education level: Not on file  Occupational History    Employer: [employer] -she is a tChartered certified accountant Tobacco Use   Smoking status: Never Smoker   Smokeless tobacco: Never Used  Substance and Sexual Activity   Alcohol use: Yes    Alcohol/week:  Wine, cocktails    Types: 1-2 Standard drinks or equivalent per week   Drug use: No   Sexual activity: Never    Partners: Male    Birth control/protection: Post-menopausal  Social History Narrative   She works as a UHydrologistin tBiomedical scientist   Lives alone.     Highest level of education:  One year graduate school   Current Outpatient Medications on File Prior to Visit  Medication Sig Dispense Refill   albuterol (VENTOLIN HFA) 108 (90 Base) MCG/ACT inhaler Inhale 2 puffs into the lungs every 6 (six) hours as needed for wheezing or shortness of breath.      atorvastatin (LIPITOR) 20 MG tablet Take 20 mg by mouth daily with supper.   0   Blood Glucose Monitoring Suppl (ONE TOUCH ULTRA 2) w/Device KIT Use to check blood sugar 2-3 times a day 1 each 0   diazepam (VALIUM) 5 MG tablet Take 5-10 mg by mouth every 8 (eight) hours as needed for anxiety.     diphenhydrAMINE (BENADRYL) 25 mg  capsule Take 25 mg by mouth every 6 (six) hours as needed for allergies.      fluticasone (FLONASE) 50 MCG/ACT nasal spray Place 2 sprays into both nostrils daily as needed for allergies.      FREESTYLE TEST STRIPS test strip   5   glipiZIDE (GLUCOTROL XL) 5 MG 24 hr tablet Take 1 tablet 2x a day before a meal 180 tablet 3   Iron-FA-B Cmp-C-Biot-Probiotic (FUSION PLUS) CAPS Take 1 capsule by mouth daily.      losartan-hydrochlorothiazide (HYZAAR) 100-25 MG per tablet Take 1  tablet by mouth every morning.     metFORMIN (GLUCOPHAGE-XR) 500 MG 24 hr tablet TAKE 4 TABLETS BY MOUTH DAILY WITH SUPPER. 360 tablet 3   omeprazole (PRILOSEC) 40 MG capsule Take 40 mg by mouth daily before breakfast.     potassium chloride (KLOR-CON) 10 MEQ tablet Take 10 mEq by mouth daily.     sertraline (ZOLOFT) 50 MG tablet Take 50 mg by mouth daily with supper.     terbinafine (LAMISIL) 250 MG tablet Please take one a day x 7days, repeat every 4 weeks x 4 months 28 tablet 0   tirzepatide (MOUNJARO) 10 MG/0.5ML Pen Inject 10 mg into the skin once a week. 2 mL 11   Vitamin D, Ergocalciferol, (DRISDOL) 1.25 MG (50000 UNIT) CAPS capsule Take 1 capsule (50,000 Units total) by mouth every 7 (seven) days. 4 capsule 0   zolpidem (AMBIEN) 10 MG tablet Take 10 mg by mouth at bedtime as needed for sleep.      No current facility-administered medications on file prior to visit.   Allergies  Allergen Reactions   Diclofenac Other (See Comments)    GAS   Glucotrol [Glipizide] Nausea Only   Lisinopril Cough   Adhesive [Tape] Rash   Family History  Problem Relation Age of Onset   Breast cancer Mother 56       Deceased, 77   Heart attack Father        Deceased, 40   COPD Father    Obesity Sister    Breast cancer Cousin 67       maternal 1st cousin   Hypertension Brother    Healthy Son    PE: BP 128/72 (BP Location: Left Arm, Patient Position: Sitting, Cuff Size: Normal)   Pulse 81   Ht _0  (1.651 m)   Wt 239 lb  12.8 oz (108.8 kg)   LMP 06/16/2005   SpO2 97%   BMI 39.90 kg/m  Wt Readings from Last 3 Encounters:  04/28/22 239 lb 12.8 oz (108.8 kg)  04/15/22 234 lb (106.1 kg)  03/11/22 239 lb (108.4 kg)   Constitutional: overweight, in NAD Eyes: EOMI, no exophthalmos ENT: no thyromegaly, no cervical lymphadenopathy Cardiovascular: RRR, No MRG Respiratory: CTA B Musculoskeletal: no deformities Skin: moist, warm, no rashes Neurological: + Very mild tremor with outstretched hands  ASSESSMENT: 1. DM2, non-insulin-dependent, now more controlled, without long-term complications, but with hyperglycemia  2. HL  3. Obesity class II  PLAN:  1. Patient with longstanding, previously uncontrolled type 2 diabetes, with improved control in the last 4 years.  She continues on metformin ER, sulfonylurea, and weekly GLP-1/GIP receptor agonist, changed from GLP-1 receptor agonist at last visit.  At that time, HbA1c was higher, at 6.6%, and sugars were slightly above target and she did not feel that they improved significantly after increasing Ozempic.  I suggested a change to [facility].  This was approved by her insurance with a  PA. -At today's visit, sugars appear to have improved in the last 2 weeks, especially after increasing Mounjaro from 7.5 to 10 mg weekly.  She tolerates it well.  Since sugars are still slightly above target, we did discuss about possibly increasing Mounjaro further, to 12.5 mg weekly and, if sugars improved, to try to stop glipizide.  She agrees with the plan. - I suggested to:  Patient Instructions  Please continue: - Metformin ER 2000 mg after dinner - Glipizide ER 5 mg before b'fast (after the Holidays, try to stop this)  Try to increase: - Mounjaro 12.5 mg weekly  Please return in 4 months with your sugar log  - we checked her HbA1c: 6.3% (lower) - advised to check sugars at different times of the day - 1x a day, rotating check times - advised for yearly eye exams >> she is  UTD - return to clinic in 4 months  2. HL -Reviewed latest lipid panel from 07/2021: Fractions at goal with the exception of a high triglyceride level: Lab Results  Component Value Date   CHOL 174 08/13/2021   HDL 84.60 08/13/2021   LDLDIRECT 33.0 08/13/2021   TRIG 217.0 (H) 08/13/2021   CHOLHDL 2 08/13/2021  -She continues on Lipitor 40 mg daily without side effects  3. Obesity class 2  -She was seen at [facility] for binge eating disorder, but not in the last few years -At last visit we switched from [place] to [facility] for stronger effect on blood sugars and weight -She started to see weight management clinic 2 to 3 weeks prior to our last visit -She lost 5 pounds since last visit -Increasing Mounjaro and stopping glipizide will also help with weight loss  Philemon Kingdom, MD PhD [facility] Endocrinology

## 2022-04-29 ENCOUNTER — Encounter (INDEPENDENT_AMBULATORY_CARE_PROVIDER_SITE_OTHER): Payer: Self-pay | Admitting: Physician Assistant

## 2022-05-05 ENCOUNTER — Telehealth (INDEPENDENT_AMBULATORY_CARE_PROVIDER_SITE_OTHER): Payer: Medicare PPO | Admitting: Psychology

## 2022-05-05 DIAGNOSIS — F5089 Other specified eating disorder: Secondary | ICD-10-CM

## 2022-05-05 DIAGNOSIS — F330 Major depressive disorder, recurrent, mild: Secondary | ICD-10-CM

## 2022-05-05 DIAGNOSIS — F411 Generalized anxiety disorder: Secondary | ICD-10-CM

## 2022-05-05 NOTE — Progress Notes (Signed)
  Office: [phone]  /  Fax: [phone]    Date: May 05, 2022    Appointment Start Time: 11:01am Duration: 30 minutes Provider: Glennie Isle, Psy.D. Type of Session: Individual Therapy  Location of Patient: Home (private location) Location of Provider: Provider's Home (private office) Type of Contact: Telepsychological Visit via MyChart Video Visit  Session Content: Barbara Jackson is a 66 y.o. female presenting for a follow-up appointment to address the previously established treatment goal of increasing coping skills.Today's appointment was a telepsychological visit. [name] provided verbal consent for today's telepsychological appointment and she is aware she is responsible for securing confidentiality on her end of the session. Prior to proceeding with today's appointment, Barbara Jackson's physical location at the time of this appointment was obtained as well a phone number she could be reached at in the event of technical difficulties. Barbara Jackson and this provider participated in today's telepsychological service.   This provider conducted a brief check-in. Barbara Jackson reported, "I feel a little bit discouraged [referring to progress with the clinic]." Further explored and processed. She also discussed planning for chocolate as previously discussed on a "few" occasions, but noted challenges as it was "hard to keep up with it." She also reported discussing her chocolate craving further with her primary therapist, and they are focusing on mindful eating and stress management. Overall, she described feeling she does not have the "resources" to plan to be successful with her eating habits, which has resulted in her wondering if continuing with the clinic is a good idea at this time. Further explored and processed. She was engaged in problem solving to help her focus on making better choices and engaging in portion control. Overall, Barbara Jackson was receptive to today's appointment as evidenced by openness to sharing,  responsiveness to feedback, and willingness to implement discussed strategies .  Mental Status Examination:  Appearance: neat Behavior: appropriate to circumstances Mood: anxious Affect: mood congruent Speech: WNL Eye Contact: appropriate Psychomotor Activity: WNL Gait: unable to assess Thought Process: linear, logical, and goal directed and no evidence or endorsement of suicidal, homicidal, and self-harm ideation, plan and intent  Thought Content/Perception: no hallucinations, delusions, bizarre thinking or behavior endorsed or observed Orientation: AAOx4 Memory/Concentration: memory, attention, language, and fund of knowledge intact  Insight: fair Judgment: fair  Interventions:  Conducted a brief chart review Provided empathic reflections and validation Employed supportive psychotherapy interventions to facilitate reduced distress and to improve coping skills with identified stressors Employed motivational interviewing skills to assess patient's willingness/desire to adhere to recommended medical treatments and assignments Engaged patient in problem solving  DSM-5 Diagnosis(es):  F50.89 Other Specified Feeding or Eating Disorder, Emotional Eating Behaviors, F41.1 Generalized Anxiety Disorder, and F33.0 Major Depressive Disorder, Recurrent Episode, Mild  Treatment Goal & Progress: During the initial appointment with this provider, the following treatment goal was established: increase coping skills. Barbara Jackson has demonstrated progress in her goal as evidenced by increased awareness of hunger patterns and increased awareness of triggers for emotional eating behaviors. Barbara Jackson also continues to demonstrate willingness to engage in learned skill(s).   Plan: Per Xzaria's request, the next appointment is scheduled for 05/26/2022 at 11am, which will be via MyChart Video Visit. The next session will focus on working towards the established treatment goal. Barbara Jackson will continue with her primary  therapist.

## 2022-05-06 DIAGNOSIS — F411 Generalized anxiety disorder: Secondary | ICD-10-CM | POA: Diagnosis not present

## 2022-05-20 ENCOUNTER — Ambulatory Visit (INDEPENDENT_AMBULATORY_CARE_PROVIDER_SITE_OTHER): Payer: Medicare PPO | Admitting: Physician Assistant

## 2022-05-26 ENCOUNTER — Telehealth (INDEPENDENT_AMBULATORY_CARE_PROVIDER_SITE_OTHER): Payer: Medicare PPO | Admitting: Psychology

## 2022-06-04 ENCOUNTER — Ambulatory Visit (INDEPENDENT_AMBULATORY_CARE_PROVIDER_SITE_OTHER): Payer: Medicare PPO | Admitting: Physician Assistant

## 2022-06-04 ENCOUNTER — Encounter (INDEPENDENT_AMBULATORY_CARE_PROVIDER_SITE_OTHER): Payer: Self-pay | Admitting: Physician Assistant

## 2022-06-04 VITALS — BP 112/76 | HR 71 | Temp 97.5°F | Ht 65.0 in | Wt 232.0 lb

## 2022-06-04 DIAGNOSIS — Z7984 Long term (current) use of oral hypoglycemic drugs: Secondary | ICD-10-CM

## 2022-06-04 DIAGNOSIS — Z7985 Long-term (current) use of injectable non-insulin antidiabetic drugs: Secondary | ICD-10-CM | POA: Diagnosis not present

## 2022-06-04 DIAGNOSIS — Z6838 Body mass index (BMI) 38.0-38.9, adult: Secondary | ICD-10-CM

## 2022-06-04 DIAGNOSIS — E1169 Type 2 diabetes mellitus with other specified complication: Secondary | ICD-10-CM | POA: Diagnosis not present

## 2022-06-04 DIAGNOSIS — F3289 Other specified depressive episodes: Secondary | ICD-10-CM

## 2022-06-04 DIAGNOSIS — E559 Vitamin D deficiency, unspecified: Secondary | ICD-10-CM | POA: Diagnosis not present

## 2022-06-04 DIAGNOSIS — E669 Obesity, unspecified: Secondary | ICD-10-CM | POA: Diagnosis not present

## 2022-06-04 DIAGNOSIS — G4719 Other hypersomnia: Secondary | ICD-10-CM | POA: Diagnosis not present

## 2022-06-04 DIAGNOSIS — R0683 Snoring: Secondary | ICD-10-CM | POA: Diagnosis not present

## 2022-06-04 MED ORDER — VITAMIN D (ERGOCALCIFEROL) 1.25 MG (50000 UNIT) PO CAPS: 50000.0000 [IU] | ORAL_CAPSULE | ORAL | 0 refills | Status: DC

## 2022-06-05 DIAGNOSIS — F411 Generalized anxiety disorder: Secondary | ICD-10-CM | POA: Diagnosis not present

## 2022-06-10 ENCOUNTER — Telehealth (INDEPENDENT_AMBULATORY_CARE_PROVIDER_SITE_OTHER): Payer: Medicare PPO | Admitting: Psychology

## 2022-06-17 ENCOUNTER — Telehealth: Payer: Self-pay

## 2022-06-17 ENCOUNTER — Other Ambulatory Visit (HOSPITAL_COMMUNITY): Payer: Self-pay

## 2022-06-17 NOTE — Telephone Encounter (Addendum)
Pharmacy Patient Advocate Encounter   Received notification that prior authorization for [facility] 12.'5MG'$ /.5ML is needed.   PER TEST CLAIM PA NOT NEEDED  NEXT FILL DUE 07-08-22  Karie Soda, CPhT Pharmacy Patient Advocate Specialist Direct Number: [phone] Fax: [phone]

## 2022-06-18 ENCOUNTER — Other Ambulatory Visit (HOSPITAL_COMMUNITY): Payer: Self-pay

## 2022-06-24 DIAGNOSIS — K219 Gastro-esophageal reflux disease without esophagitis: Secondary | ICD-10-CM | POA: Diagnosis not present

## 2022-06-24 DIAGNOSIS — D509 Iron deficiency anemia, unspecified: Secondary | ICD-10-CM | POA: Diagnosis not present

## 2022-06-24 DIAGNOSIS — K449 Diaphragmatic hernia without obstruction or gangrene: Secondary | ICD-10-CM | POA: Diagnosis not present

## 2022-06-24 DIAGNOSIS — Z1211 Encounter for screening for malignant neoplasm of colon: Secondary | ICD-10-CM | POA: Diagnosis not present

## 2022-06-28 NOTE — Progress Notes (Signed)
Chief Complaint:   OBESITY Barbara Jackson is here to discuss her progress with her obesity treatment plan along with follow-up of her obesity related diagnoses. Barbara Jackson is on the Category 3 Plan and the [place] and states she is following her eating plan approximately 10% of the time. Artasia states she is walking and swimming 20 minutes 2-3 times per week.  Today's visit was #: 7 Starting weight: 242 lbs Starting date: 12/04/2021 Today's weight: 232 lbs Today's date: 06/04/2022 Total lbs lost to date: 10 Total lbs lost since last in-office visit: 2  Interim History: Chrisy has been helping her sister after knee surgery and she struggled to focus on her prescribed plan: Breakfast- omelet or egg sandwich Barbara Jackson 45-calorie bread Lunch- [place] sandwich Dinner- protein/vegetable and bread She reports snacking on chocolate and struggles to eat less than 3-4 servings daily of chocolate.  Subjective:   1. Type 2 diabetes mellitus with other specified complication, without long-term current use of insulin (HCC) Barbara Jackson followed up with Dr. Cruzita Lederer and continues on Metformin 2000 mg nightly, Glipizide 5 mg BID, and Mounjaro 12.5 mg weekly. She denies side effects of medications. Her A1c was 6.6 on 12/04/2021 and was down to 6.3 on 04/28/2022. No hypoglycemia.  2. Vitamin D insufficiency Barbara Jackson is taking Vitamin D 50,000 IU weekly with no side effects.  3. Other depression Barbara Jackson has been working with Dr. Mallie Mussel and may be transitioning and seeing her regular therapist to help address her emotional eating behavior and strategies for emotional eating.  Assessment/Plan:   1. Type 2 diabetes mellitus with other specified complication, without long-term current use of insulin (HCC) Continue Metformin, Glipizide, and Mounjaro per Dr. Cruzita Lederer and continue prescribed nutrition plan to decrease simple carbohydrates, increase lean proteins and exercise to promote weight loss and improve glycemic  control.  2. Vitamin D insufficiency Low Vitamin D level contributes to fatigue and are associated with obesity, breast, and colon cancer. She agrees to continue to take prescription Vitamin D 50,000 IU every week and will follow-up for routine testing of Vitamin D, at least 2-3 times per year to avoid over-replacement.  Refill- Vitamin D, Ergocalciferol, (DRISDOL) 1.25 MG (50000 UNIT) CAPS capsule; Take 1 capsule (50,000 Units total) by mouth every 7 (seven) days.  Dispense: 12 capsule; Refill: 0  3. Other depression Behavior modification techniques were discussed today to help Barbara Jackson deal with her emotional/non-hunger eating behaviors.  Orders and follow up as documented in patient record. Continue counseling to help with emotional eating behaviors.  4. Obesity, Current BMI 38.7 Barbara Jackson is currently in the action stage of change. As such, her goal is to continue with weight loss efforts. She has agreed to the Category 3 Plan and the [place].   Exercise goals:  As is  Behavioral modification strategies: increasing lean protein intake, decreasing simple carbohydrates, and better snacking choices.  Barbara Jackson has agreed to follow-up with our clinic in 4 weeks. She was informed of the importance of frequent follow-up visits to maximize her success with intensive lifestyle modifications for her multiple health conditions.   Objective:   Blood pressure 112/76, pulse 71, temperature (!) 97.5 F (36.4 C), height '5\' 5"'$  (1.651 m), weight 232 lb (105.2 kg), last menstrual period 06/16/2005, SpO2 97 %. Body mass index is 38.61 kg/m.  General: Cooperative, alert, well developed, in no acute distress. HEENT: Conjunctivae and lids unremarkable. Cardiovascular: Regular rhythm.  Lungs: Normal work of breathing. Neurologic: No focal deficits.   Lab Results  Component Value Date   CREATININE 0.89 12/04/2021   BUN 16 12/04/2021   NA 139 12/04/2021   K 4.2 12/04/2021   CL 97 12/04/2021   CO2 21  12/04/2021   Lab Results  Component Value Date   ALT 29 12/04/2021   AST 22 12/04/2021   ALKPHOS 66 12/04/2021   BILITOT 0.4 12/04/2021   Lab Results  Component Value Date   HGBA1C 6.3 (A) 04/28/2022   HGBA1C 6.6 (H) 12/04/2021   HGBA1C 6.4 (A) 02/11/2021   HGBA1C 6.1 (A) 09/19/2020   HGBA1C 6.3 (A) 05/16/2020   Lab Results  Component Value Date   INSULIN 11.8 12/04/2021   Lab Results  Component Value Date   TSH 2.320 12/04/2021   Lab Results  Component Value Date   CHOL 174 08/13/2021   HDL 84.60 08/13/2021   LDLDIRECT 33.0 08/13/2021   TRIG 217.0 (H) 08/13/2021   CHOLHDL 2 08/13/2021   Lab Results  Component Value Date   VD25OH 27.6 (L) 12/04/2021   Lab Results  Component Value Date   WBC 12.5 (H) 01/12/2019   HGB 12.2 01/12/2019   HCT 37.3 01/12/2019   MCV 86.5 01/12/2019   PLT 417 (H) 01/12/2019   Lab Results  Component Value Date   FERRITIN 22 12/04/2017   Attestation Statements:   Reviewed by clinician on day of visit: allergies, medications, problem list, medical history, surgical history, family history, social history, and previous encounter notes.  I, Kathlene November, BS, CMA, am acting as transcriptionist for [employer] Duke Weisensel, PA-C.  I have reviewed the above documentation for accuracy and completeness, and I agree with the above. -  Marlena Barbato,PA-C

## 2022-07-01 DIAGNOSIS — F411 Generalized anxiety disorder: Secondary | ICD-10-CM | POA: Diagnosis not present

## 2022-07-02 DIAGNOSIS — G4733 Obstructive sleep apnea (adult) (pediatric): Secondary | ICD-10-CM | POA: Diagnosis not present

## 2022-07-09 ENCOUNTER — Ambulatory Visit (INDEPENDENT_AMBULATORY_CARE_PROVIDER_SITE_OTHER): Payer: Medicare PPO | Admitting: Family Medicine

## 2022-07-09 ENCOUNTER — Encounter (INDEPENDENT_AMBULATORY_CARE_PROVIDER_SITE_OTHER): Payer: Self-pay | Admitting: Family Medicine

## 2022-07-09 VITALS — BP 131/84 | HR 95 | Temp 97.6°F | Ht 65.0 in | Wt 231.0 lb

## 2022-07-09 DIAGNOSIS — Z7985 Long-term (current) use of injectable non-insulin antidiabetic drugs: Secondary | ICD-10-CM | POA: Diagnosis not present

## 2022-07-09 DIAGNOSIS — E669 Obesity, unspecified: Secondary | ICD-10-CM | POA: Diagnosis not present

## 2022-07-09 DIAGNOSIS — M25512 Pain in left shoulder: Secondary | ICD-10-CM

## 2022-07-09 DIAGNOSIS — Z6838 Body mass index (BMI) 38.0-38.9, adult: Secondary | ICD-10-CM

## 2022-07-09 DIAGNOSIS — G8929 Other chronic pain: Secondary | ICD-10-CM | POA: Diagnosis not present

## 2022-07-09 DIAGNOSIS — E1169 Type 2 diabetes mellitus with other specified complication: Secondary | ICD-10-CM | POA: Diagnosis not present

## 2022-07-16 DIAGNOSIS — M79602 Pain in left arm: Secondary | ICD-10-CM | POA: Diagnosis not present

## 2022-07-16 DIAGNOSIS — E119 Type 2 diabetes mellitus without complications: Secondary | ICD-10-CM | POA: Diagnosis not present

## 2022-07-22 DIAGNOSIS — F411 Generalized anxiety disorder: Secondary | ICD-10-CM | POA: Diagnosis not present

## 2022-07-23 DIAGNOSIS — G4733 Obstructive sleep apnea (adult) (pediatric): Secondary | ICD-10-CM | POA: Diagnosis not present

## 2022-07-23 NOTE — Progress Notes (Signed)
Chief Complaint:   OBESITY Barbara Jackson is here to discuss her progress with her obesity treatment plan along with follow-up of her obesity related diagnoses. Barbara Jackson is on the Category 3 Plan and the [place] and states she is following her eating plan approximately 10% of the time. Barbara Jackson states she is walking, recumbent bike for 20 minutes 3 times per week.  Today's visit was #: 8 Starting weight: 242 lbs Starting date: 12/04/2021 Today's weight: 231 lbs Today's date: 07/09/2022 Total lbs lost to date: 11 Total lbs lost since last in-office visit: 1  Interim History: Barbara Jackson has done well with avoiding holiday weight gain, but she is not following a structured eating plan. She is working on portion control but she has been carrying for her sister and hasn't been able to meal plan as much.   Subjective:   1. Type 2 diabetes mellitus with other specified complication, without long-term current use of insulin (HCC) Barbara Jackson feels the [place] is helping her portion control. She is mindful of increase protein, but doesn't always met her goals. She denies nausea or vomiting.   2. Chronic left shoulder pain Barbara Jackson denies injury but notes increased left shoulder and upper arm pain especially when she raises her arm behind her. This is limiting her exercise as she is unable to swim which she really enjoys.    Assessment/Plan:   1. Type 2 diabetes mellitus with other specified complication, without long-term current use of insulin (HCC) Barbara Jackson was educated on the importance of increasing protein to continue glucose and decreased polyphagia. She will continue [facility] and continue to monitor her eating and exercise.   2. Chronic left shoulder pain Barbara Jackson was referred to [facility] who she has seen previously for further evaluation and treatment.   3. BMI 38.0-38.9,adult  4. Obesity, Beginning BMI 40.27 Barbara Jackson is currently in the action stage of change. As such, her goal is to continue with  weight loss efforts. She has agreed to the Category 3 Plan.   Barbara Jackson was encouraged to continue to be mindful until she is ready to get back on track.  Exercise goals: As is.   Behavioral modification strategies: no skipping meals and emotional eating strategies.  Barbara Jackson has agreed to follow-up with our clinic in 3 to 4 weeks. She was informed of the importance of frequent follow-up visits to maximize her success with intensive lifestyle modifications for her multiple health conditions.   Objective:   Blood pressure 131/84, pulse 95, temperature 97.6 F (36.4 C), height '5\' 5"'$  (1.651 m), weight 231 lb (104.8 kg), last menstrual period 06/16/2005, SpO2 95 %. Body mass index is 38.44 kg/m.  General: Cooperative, alert, well developed, in no acute distress. HEENT: Conjunctivae and lids unremarkable. Cardiovascular: Regular rhythm.  Lungs: Normal work of breathing. Neurologic: No focal deficits.   Lab Results  Component Value Date   CREATININE 0.89 12/04/2021   BUN 16 12/04/2021   NA 139 12/04/2021   K 4.2 12/04/2021   CL 97 12/04/2021   CO2 21 12/04/2021   Lab Results  Component Value Date   ALT 29 12/04/2021   AST 22 12/04/2021   ALKPHOS 66 12/04/2021   BILITOT 0.4 12/04/2021   Lab Results  Component Value Date   HGBA1C 6.3 (A) 04/28/2022   HGBA1C 6.6 (H) 12/04/2021   HGBA1C 6.4 (A) 02/11/2021   HGBA1C 6.1 (A) 09/19/2020   HGBA1C 6.3 (A) 05/16/2020   Lab Results  Component Value Date   INSULIN 11.8  12/04/2021   Lab Results  Component Value Date   TSH 2.320 12/04/2021   Lab Results  Component Value Date   CHOL 174 08/13/2021   HDL 84.60 08/13/2021   LDLDIRECT 33.0 08/13/2021   TRIG 217.0 (H) 08/13/2021   CHOLHDL 2 08/13/2021   Lab Results  Component Value Date   VD25OH 27.6 (L) 12/04/2021   Lab Results  Component Value Date   WBC 12.5 (H) 01/12/2019   HGB 12.2 01/12/2019   HCT 37.3 01/12/2019   MCV 86.5 01/12/2019   PLT 417 (H) 01/12/2019   Lab  Results  Component Value Date   FERRITIN 22 12/04/2017   Attestation Statements:   Reviewed by clinician on day of visit: allergies, medications, problem list, medical history, surgical history, family history, social history, and previous encounter notes.  Time spent on visit including pre-visit chart review and post-visit care and charting was 30 minutes.   I, Trixie Dredge, am acting as transcriptionist for Dennard Nip, MD.  I have reviewed the above documentation for accuracy and completeness, and I agree with the above. -  Dennard Nip, MD

## 2022-07-28 DIAGNOSIS — M25512 Pain in left shoulder: Secondary | ICD-10-CM | POA: Diagnosis not present

## 2022-08-01 ENCOUNTER — Encounter (INDEPENDENT_AMBULATORY_CARE_PROVIDER_SITE_OTHER): Payer: Self-pay | Admitting: Family Medicine

## 2022-08-06 ENCOUNTER — Ambulatory Visit (INDEPENDENT_AMBULATORY_CARE_PROVIDER_SITE_OTHER): Payer: Medicare PPO | Admitting: Family Medicine

## 2022-08-07 ENCOUNTER — Encounter: Payer: Self-pay | Admitting: Internal Medicine

## 2022-08-07 DIAGNOSIS — E1165 Type 2 diabetes mellitus with hyperglycemia: Secondary | ICD-10-CM

## 2022-08-07 MED ORDER — TIRZEPATIDE 12.5 MG/0.5ML ~~LOC~~ SOAJ: 12.5000 mg | SUBCUTANEOUS | 3 refills | Status: DC

## 2022-08-11 MED ORDER — TIRZEPATIDE 15 MG/0.5ML ~~LOC~~ SOAJ: 15.0000 mg | SUBCUTANEOUS | 2 refills | Status: DC

## 2022-08-19 DIAGNOSIS — M25512 Pain in left shoulder: Secondary | ICD-10-CM | POA: Diagnosis not present

## 2022-08-20 ENCOUNTER — Ambulatory Visit (INDEPENDENT_AMBULATORY_CARE_PROVIDER_SITE_OTHER): Payer: Medicare PPO | Admitting: Family Medicine

## 2022-08-20 ENCOUNTER — Telehealth (INDEPENDENT_AMBULATORY_CARE_PROVIDER_SITE_OTHER): Payer: Self-pay | Admitting: Family Medicine

## 2022-08-20 ENCOUNTER — Encounter (INDEPENDENT_AMBULATORY_CARE_PROVIDER_SITE_OTHER): Payer: Self-pay | Admitting: Family Medicine

## 2022-08-20 VITALS — BP 98/68 | HR 92 | Temp 97.9°F | Ht 65.0 in | Wt 224.0 lb

## 2022-08-20 DIAGNOSIS — E559 Vitamin D deficiency, unspecified: Secondary | ICD-10-CM | POA: Diagnosis not present

## 2022-08-20 DIAGNOSIS — E1169 Type 2 diabetes mellitus with other specified complication: Secondary | ICD-10-CM | POA: Diagnosis not present

## 2022-08-20 DIAGNOSIS — Z6837 Body mass index (BMI) 37.0-37.9, adult: Secondary | ICD-10-CM

## 2022-08-20 DIAGNOSIS — Z7985 Long-term (current) use of injectable non-insulin antidiabetic drugs: Secondary | ICD-10-CM

## 2022-08-20 DIAGNOSIS — E669 Obesity, unspecified: Secondary | ICD-10-CM | POA: Diagnosis not present

## 2022-08-20 DIAGNOSIS — Z6836 Body mass index (BMI) 36.0-36.9, adult: Secondary | ICD-10-CM | POA: Insufficient documentation

## 2022-08-20 NOTE — Telephone Encounter (Signed)
3/6 Patient stated that she need a refill on her vitam D. JE

## 2022-08-21 DIAGNOSIS — F411 Generalized anxiety disorder: Secondary | ICD-10-CM | POA: Diagnosis not present

## 2022-08-21 NOTE — Telephone Encounter (Signed)
Rx added to yesterday's encounter for Dr. Trixie Rude to sign off on.

## 2022-08-22 ENCOUNTER — Encounter (INDEPENDENT_AMBULATORY_CARE_PROVIDER_SITE_OTHER): Payer: Self-pay | Admitting: Family Medicine

## 2022-08-22 DIAGNOSIS — E559 Vitamin D deficiency, unspecified: Secondary | ICD-10-CM

## 2022-08-26 ENCOUNTER — Other Ambulatory Visit: Payer: Self-pay | Admitting: Internal Medicine

## 2022-08-26 ENCOUNTER — Other Ambulatory Visit (INDEPENDENT_AMBULATORY_CARE_PROVIDER_SITE_OTHER): Payer: Self-pay | Admitting: Physician Assistant

## 2022-08-26 ENCOUNTER — Other Ambulatory Visit (INDEPENDENT_AMBULATORY_CARE_PROVIDER_SITE_OTHER): Payer: Self-pay | Admitting: Family Medicine

## 2022-08-26 DIAGNOSIS — M25512 Pain in left shoulder: Secondary | ICD-10-CM | POA: Diagnosis not present

## 2022-08-26 DIAGNOSIS — E1165 Type 2 diabetes mellitus with hyperglycemia: Secondary | ICD-10-CM

## 2022-08-26 DIAGNOSIS — E559 Vitamin D deficiency, unspecified: Secondary | ICD-10-CM

## 2022-08-28 MED ORDER — VITAMIN D (ERGOCALCIFEROL) 1.25 MG (50000 UNIT) PO CAPS: 50000.0000 [IU] | ORAL_CAPSULE | ORAL | 0 refills | Status: DC

## 2022-09-01 ENCOUNTER — Ambulatory Visit: Payer: Medicare PPO | Admitting: Internal Medicine

## 2022-09-01 ENCOUNTER — Encounter: Payer: Self-pay | Admitting: Internal Medicine

## 2022-09-01 VITALS — BP 100/62 | HR 88 | Ht 65.0 in | Wt 229.6 lb

## 2022-09-01 DIAGNOSIS — E669 Obesity, unspecified: Secondary | ICD-10-CM

## 2022-09-01 DIAGNOSIS — E785 Hyperlipidemia, unspecified: Secondary | ICD-10-CM

## 2022-09-01 DIAGNOSIS — E1165 Type 2 diabetes mellitus with hyperglycemia: Secondary | ICD-10-CM

## 2022-09-01 LAB — POCT GLYCOSYLATED HEMOGLOBIN (HGB A1C): Hemoglobin A1C: 6.4 % — AB (ref 4.0–5.6)

## 2022-09-01 NOTE — Patient Instructions (Addendum)
Please continue: - Metformin ER 2000 mg after dinner - Glipizide ER 5 mg before b'fast  - Mounjaro 12.5 mg weekly  Please return in 4-6 months with your sugar log

## 2022-09-01 NOTE — Progress Notes (Signed)
Patient ID: Barbara Jackson, female   DOB: 1956-02-16, 67 y.o.   MRN: [id number]   HPI: Barbara Jackson is a 67 y.o.-year-old female, returning for follow-up for DM2, dx in 2007, non-insulin-dependent, now  controlled, without long-term complications.  Last visit 4 months ago.  Interim history: No increased urination, blurry vision, nausea, chest pain.   He continues to see the weight management clinic.  She also sees the clinic psychologist. She lost a net 10 pounds since last visit! Her son was recently diagnosed with diabetes.  He lives in [place].  Reviewed HbA1c levels: Lab Results  Component Value Date   HGBA1C 6.3 (A) 04/28/2022   HGBA1C 6.6 (H) 12/04/2021   HGBA1C 6.4 (A) 02/11/2021   HGBA1C 6.1 (A) 09/19/2020   HGBA1C 6.3 (A) 05/16/2020   HGBA1C 6.1 (A) 08/23/2019   HGBA1C 6.4 (A) 04/25/2019   HGBA1C 6.2 (A) 12/20/2018   HGBA1C 6.5 (A) 08/17/2018   HGBA1C 6.1 (A) 05/03/2018  08/13/2021: HbA1c 6.9% 09/19/2020: HbA1c 6.1% 07/25/2020: HbA1c 6.4% 11/25/2019: HbA1c 6.6% 02/22/2018: HbA1c 7.0% 07/2017: HbA1c 7.7% 07/10/2016: HbA1c 7.0%  Pt is on a regimen of: - Metformin ER 2000 mg after dinner - Glipizide XL 10 >> 5-10 mg before breakfast  >> 5 mg in am >> off since 07/2022 - Ozempic 0.5 >> 1 >> 2 mg weekly in a.m. >> Mounjaro 7.5 >> 10 >> 12.5 mg weekly We stopped Actos 08/17/2018. She tried Januvia, but stopped last year when she was on a new diet at that time and did not want to make any changes in her medicines.  Pt checks her sugars  0 to once a day per review of her log: - am:  120-144, 153 >> 124-147 >> 125-174, 201 (forgot meds) >> 120-141, 147 - 2h after b'fast: 112-151, 196 >> 105-175>> 149-160 >> 136, 151 - before lunch: 127, 130 >> 101-145, 163 >> 98-147 >> 115-130 >> 111-128 - 2h after lunch:  117-147 >> 119-197, 234 >> 123-176, 194 >> 114-122, 153 - before dinner:  80-160, 243 >> 107-149, 190 >> 128, 148 >> 83-122, 153 - 2h after dinner:  141-196 >> 125-200  >> n/c >> 133-141>> 113-133 - bedtime:  130-166  >> 144-190 >> 120-162>> 111>> 168, 240 (immed. After dessert) - nighttime: n/c >> 120, 133 >> n/c >> 131 >> n/c Lowest sugar was  92 >> 80>> 111 >> 83; she has hypoglycemia awareness in the 70s. Highest sugar was 196 (dessert) >> 200 >> 201 >> 240.  Glucometer: One Touch Ultra 2  In the past, she read Dr. Janene Jackson program for reversing diabetes program started to adopt the changes suggested in the log.  Her sugars started to improve significantly.   She also saw Barbara Jackson with nutrition in the past.  She was being seen in the binge eating clinic at [facility].  However, not recently.  -No CKD, last BUN/creatinine:  Lab Results  Component Value Date   BUN 16 12/04/2021   BUN 19 08/13/2021   CREATININE 0.89 12/04/2021   CREATININE 0.80 08/13/2021  02/22/2018: 17/0.88, GFR 65, glucose 136, with the rest of the CMP normal; ACR 6.4 07/10/2016: 20/0.83 On losartan 100.  -+ HL;  last set of lipids: Lab Results  Component Value Date   CHOL 174 08/13/2021   HDL 84.60 08/13/2021   LDLDIRECT 33.0 08/13/2021   TRIG 217.0 (H) 08/13/2021   CHOLHDL 2 08/13/2021  07/25/2020: 145/161/83/38 06/12/2019: 170/137/76/71 02/22/2018: 139/109/71/46 07/10/2016: 164/122/84/32  On Lipitor  73.  - last eye exam was 11/21/2021: No DR reportedly; glaucoma suspect Barbara Jackson, Dr. Gershon Crane).   - no numbness and tingling in her feet.  She sees podiatry-Dr. Paulla Jackson.  Last foot exam 07/2021.  Pt has FH of DM in M[place].  She also has depression-on Cymbalta, HTN, frequent UTIs and pelvic floor pain.  Previously on treatment of pain chronically. She saw several specialists but no clear diagnosis was made.  Symptoms resolved ~10/2020. She is on iron -it consistently.  ROS: + see HPI  I reviewed pt's medications, allergies, PMH, social hx, family hx, and changes were documented in the history of present illness. Otherwise, unchanged from my initial visit  note.  Past Medical History:  Diagnosis Date   Abnormal pap 1998   Negative Hpv   Anxiety    Arthritis    [name] lesion, chronic    Diabetes mellitus    Dysrhythmia    hx of atrial tachycardia and pacs   Esophageal reflux    H/O hiatal hernia    H/O iron deficiency anemia 08/2016   Hypercholesteremia    Hypertension    Obesity    compulsive overeater   Osteoarthritis    Past Surgical History:  Procedure Laterality Date   APPENDECTOMY  1975   TOTAL KNEE ARTHROPLASTY  01/12/2012   Procedure: TOTAL KNEE ARTHROPLASTY;  Surgeon: Gearlean Alf, MD;  Location: [facility];  Service: Orthopedics;  Laterality: Left;   TOTAL KNEE ARTHROPLASTY Right 01/10/2019   Procedure: TOTAL KNEE ARTHROPLASTY;  Surgeon: Gaynelle Arabian, MD;  Location: [facility];  Service: Orthopedics;  Laterality: Right;  47min   Social History   Socioeconomic History   Marital status: Divorced    Spouse name: Not on file   Number of children: 1   Years of education: Not on file   Highest education level: Not on file  Occupational History    Employer: [employer] -she is a Chartered certified accountant  Tobacco Use   Smoking status: Never Smoker   Smokeless tobacco: Never Used  Substance and Sexual Activity   Alcohol use: Yes    Alcohol/week:  Wine, cocktails    Types: 1-2 Standard drinks or equivalent per week   Drug use: No   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     Highest level of education:  One year graduate school   Current Outpatient Medications on File Prior to Visit  Medication Sig Dispense Refill   albuterol (VENTOLIN HFA) 108 (90 Base) MCG/ACT inhaler Inhale 2 puffs into the lungs every 6 (six) hours as needed for wheezing or shortness of breath.      atorvastatin (LIPITOR) 20 MG tablet Take 20 mg by mouth daily with supper.   0   Blood Glucose Monitoring Suppl (ONE  TOUCH ULTRA 2) w/Device KIT Use to check blood sugar 2-3 times a day 1 each 0   diazepam (VALIUM) 5 MG tablet Take 5-10 mg by mouth every 8 (eight) hours as needed for anxiety.     diphenhydrAMINE (BENADRYL) 25 mg capsule Take 25 mg by mouth every 6 (six) hours as needed for allergies.      fluticasone (FLONASE) 50 MCG/ACT nasal spray Place 2 sprays into both nostrils daily as needed for allergies.      FREESTYLE TEST STRIPS test strip   5   glipiZIDE (GLUCOTROL  XL) 5 MG 24 hr tablet Take 1 tablet 2x a day before a meal 180 tablet 3   Iron-FA-B Cmp-C-Biot-Probiotic (FUSION PLUS) CAPS Take 1 capsule by mouth daily.      losartan-hydrochlorothiazide (HYZAAR) 100-25 MG per tablet Take 1 tablet by mouth every morning.     metFORMIN (GLUCOPHAGE-XR) 500 MG 24 hr tablet TAKE 4 TABLETS BY MOUTH DAILY WITH SUPPER. 360 tablet 3   omeprazole (PRILOSEC) 40 MG capsule Take 40 mg by mouth daily before breakfast.     potassium chloride (KLOR-CON) 10 MEQ tablet Take 10 mEq by mouth daily.     sertraline (ZOLOFT) 50 MG tablet Take 50 mg by mouth daily with supper.     terbinafine (LAMISIL) 250 MG tablet Please take one a day x 7days, repeat every 4 weeks x 4 months 28 tablet 0   tirzepatide (MOUNJARO) 12.5 MG/0.5ML Pen INJECT 12.5 MG SUBCUTANEOUSLY ONE TIME PER WEEK 6 mL 3   tirzepatide (MOUNJARO) 15 MG/0.5ML Pen Inject 15 mg into the skin once a week. 2 mL 2   Vitamin D, Ergocalciferol, (DRISDOL) 1.25 MG (50000 UNIT) CAPS capsule Take 1 capsule (50,000 Units total) by mouth every 7 (seven) days. 12 capsule 0   zolpidem (AMBIEN) 10 MG tablet Take 10 mg by mouth at bedtime as needed for sleep.      No current facility-administered medications on file prior to visit.   Allergies  Allergen Reactions   Diclofenac Other (See Comments)    GAS   Glucotrol [Glipizide] Nausea Only   Lisinopril Cough   Adhesive [Tape] Rash   Family History  Problem Relation Age of Onset   Breast cancer Mother 25       Deceased,  18   Heart attack Father        Deceased, 56   COPD Father    Obesity Sister    Breast cancer Cousin 60       maternal 1st cousin   Hypertension Brother    Healthy Son    PE: BP 100/62 (BP Location: Right Arm, Patient Position: Sitting, Cuff Size: Normal)   Pulse 88   Ht 5\' 5"  (1.651 m)   Wt 229 lb 9.6 oz (104.1 kg)   LMP 06/16/2005   SpO2 98%   BMI 38.21 kg/m  Wt Readings from Last 3 Encounters:  09/01/22 229 lb 9.6 oz (104.1 kg)  08/20/22 224 lb (101.6 kg)  07/09/22 231 lb (104.8 kg)   Constitutional: overweight, in NAD Eyes: EOMI, no exophthalmos ENT: no thyromegaly, no cervical lymphadenopathy Cardiovascular: RRR, No MRG Respiratory: CTA B Musculoskeletal: no deformities Skin: moist, warm, no rashes Neurological: + Very mild tremor with outstretched hands  ASSESSMENT: 1. DM2, non-insulin-dependent, now more controlled, without long-term complications, but with hyperglycemia  2. HL  3. Obesity class II  PLAN:  1. Patient with longstanding, previously uncontrolled type 2 diabetes, with improved control in the last 4.5 years.  She is on metformin ER, GLP-1/GIP receptor agonist, and previously on sulfonylurea, which I advised her to stop at last visit. -At last visit, sugars were improved especially after increasing Mounjaro from 7.5 to 10 mg weekly.  Since some of the sugars are still above target we discussed about possibly increasing the Mounjaro dose further, to 12.5 mg weekly and, if sugars improved, to try to stop glipizide.  At that time, HbA1c was 6.3%, improved. -At today's visit, she is only on metformin and Mounjaro, and off the glipizide.  Her sugars stayed very well-controlled  since last visit, improved before meals, but she started to check occasionally immediately after she has dessert and she noticed some higher blood sugars, up to 240.  We discussed about optimal time to have dessert (after a meal, and not between meals), before exercise, not during a  period of time when she has a lot of stress.  Otherwise, I do not feel we need to change her regimen for now. - I suggested to:  Patient Instructions  Please continue: - Metformin ER 2000 mg after dinner - Glipizide ER 5 mg before b'fast  - Mounjaro 12.5 mg weekly  Please return in 4-6 months with your sugar log  - we checked her HbA1c: 6.4% (slightly higher) - advised to check sugars at different times of the day - 1x a day, rotating check times - advised for yearly eye exams >> she is UTD - return to clinic in 4-6 months  2. HL -Reviewed latest lipid panel from 07/2021: Fractions at goal with the exception of a high triglyceride level: Lab Results  Component Value Date   CHOL 174 08/13/2021   HDL 84.60 08/13/2021   LDLDIRECT 33.0 08/13/2021   TRIG 217.0 (H) 08/13/2021   CHOLHDL 2 08/13/2021  -She is on Lipitor 40 mg daily-no side effects  3. Obesity class 2  -She was seen at [facility] for binge eating disorder, but not in the last few years -We used from [facility] to [facility] for stronger effect on weight -She is also seeing weight management clinic -She lost 15 pounds since last visit!  (From 239 lbs)  Philemon Kingdom, MD PhD [facility]

## 2022-09-02 DIAGNOSIS — M25512 Pain in left shoulder: Secondary | ICD-10-CM | POA: Diagnosis not present

## 2022-09-02 MED ORDER — VITAMIN D (ERGOCALCIFEROL) 1.25 MG (50000 UNIT) PO CAPS: 50000.0000 [IU] | ORAL_CAPSULE | ORAL | 0 refills | Status: DC

## 2022-09-02 NOTE — Progress Notes (Signed)
Chief Complaint:   OBESITY Barbara Jackson is here to discuss her progress with her obesity treatment plan along with follow-up of her obesity related diagnoses. Barbara Jackson is on the Category 3 Plan and states she is following her eating plan approximately 15% of the time. Barbara Jackson states she is walking and bike riding for 20 minutes 3-4 times per week.  Today's visit was #: 9 Starting weight: 242 lbs Starting date: 12/04/2021 Today's weight: 224 lbs Today's date: 08/20/2022 Total lbs lost to date: 18 Total lbs lost since last in-office visit: 7  Interim History: Barbara Jackson has done well with weight loss in the last month. She did a lot of traveling and tried to portion control and increase protein. She has started physical therapy for her shoulder.   Subjective:   1. Type 2 diabetes mellitus with other specified complication, without long-term current use of insulin (HCC) Barbara Jackson is on [place] and she had her dose increased to 15 mg recently. She is doing well with no nausea or vomiting, but she notes decreased polyphagia. She continues to work on her diet and exercise, and losing weight.   2. Vitamin D insufficiency Barbara Jackson is taking prescription Vitamin D with no side effects noted.   Assessment/Plan:   1. Type 2 diabetes mellitus with other specified complication, without long-term current use of insulin (HCC) Barbara Jackson will continue [place] and her diet. We will continue to monitor her progress and treat with diet and exercise.   2. Vitamin D insufficiency Barbara Jackson will continue prescription Vitamin D 50,000 IU every week #12, and we will refill for 90 days. She will follow-up for routine testing of Vitamin D, at least 2-3 times per year to avoid over-replacement.  3. BMI 37.0-37.9, adult  4. Obesity, Beginning BMI 40.27 Barbara Jackson is currently in the action stage of change. As such, her goal is to continue with weight loss efforts. She has agreed to the Category 3 Plan.   Exercise goals: As is.    Behavioral modification strategies: increasing lean protein intake and meal planning and cooking strategies.  Barbara Jackson has agreed to follow-up with our clinic in 4 weeks. She was informed of the importance of frequent follow-up visits to maximize her success with intensive lifestyle modifications for her multiple health conditions.   Objective:   Blood pressure 98/68, pulse 92, temperature 97.9 F (36.6 C), height 5\' 5"  (1.651 m), weight 224 lb (101.6 kg), last menstrual period 06/16/2005, SpO2 95 %. Body mass index is 37.28 kg/m.  Lab Results  Component Value Date   CREATININE 0.89 12/04/2021   BUN 16 12/04/2021   NA 139 12/04/2021   K 4.2 12/04/2021   CL 97 12/04/2021   CO2 21 12/04/2021   Lab Results  Component Value Date   ALT 29 12/04/2021   AST 22 12/04/2021   ALKPHOS 66 12/04/2021   BILITOT 0.4 12/04/2021   Lab Results  Component Value Date   HGBA1C 6.4 (A) 09/01/2022   HGBA1C 6.3 (A) 04/28/2022   HGBA1C 6.6 (H) 12/04/2021   HGBA1C 6.4 (A) 02/11/2021   HGBA1C 6.1 (A) 09/19/2020   Lab Results  Component Value Date   INSULIN 11.8 12/04/2021   Lab Results  Component Value Date   TSH 2.320 12/04/2021   Lab Results  Component Value Date   CHOL 174 08/13/2021   HDL 84.60 08/13/2021   LDLDIRECT 33.0 08/13/2021   TRIG 217.0 (H) 08/13/2021   CHOLHDL 2 08/13/2021   Lab Results  Component Value Date  VD25OH 27.6 (L) 12/04/2021   Lab Results  Component Value Date   WBC 12.5 (H) 01/12/2019   HGB 12.2 01/12/2019   HCT 37.3 01/12/2019   MCV 86.5 01/12/2019   PLT 417 (H) 01/12/2019   Lab Results  Component Value Date   FERRITIN 22 12/04/2017   Attestation Statements:   Reviewed by clinician on day of visit: allergies, medications, problem list, medical history, surgical history, family history, social history, and previous encounter notes.  Time spent on visit including pre-visit chart review and post-visit care and charting was 30 minutes.   I,  Trixie Dredge, am acting as transcriptionist for Dennard Nip, MD.  I have reviewed the above documentation for accuracy and completeness, and I agree with the above. -  Dennard Nip, MD

## 2022-09-03 DIAGNOSIS — D509 Iron deficiency anemia, unspecified: Secondary | ICD-10-CM | POA: Diagnosis not present

## 2022-09-03 DIAGNOSIS — E1165 Type 2 diabetes mellitus with hyperglycemia: Secondary | ICD-10-CM | POA: Diagnosis not present

## 2022-09-03 DIAGNOSIS — E7800 Pure hypercholesterolemia, unspecified: Secondary | ICD-10-CM | POA: Diagnosis not present

## 2022-09-03 DIAGNOSIS — Z0000 Encounter for general adult medical examination without abnormal findings: Secondary | ICD-10-CM | POA: Diagnosis not present

## 2022-09-04 DIAGNOSIS — M25552 Pain in left hip: Secondary | ICD-10-CM | POA: Diagnosis not present

## 2022-09-04 DIAGNOSIS — R2689 Other abnormalities of gait and mobility: Secondary | ICD-10-CM | POA: Diagnosis not present

## 2022-09-04 DIAGNOSIS — M546 Pain in thoracic spine: Secondary | ICD-10-CM | POA: Diagnosis not present

## 2022-09-08 DIAGNOSIS — E7800 Pure hypercholesterolemia, unspecified: Secondary | ICD-10-CM | POA: Diagnosis not present

## 2022-09-08 DIAGNOSIS — Z0000 Encounter for general adult medical examination without abnormal findings: Secondary | ICD-10-CM | POA: Diagnosis not present

## 2022-09-08 DIAGNOSIS — I728 Aneurysm of other specified arteries: Secondary | ICD-10-CM | POA: Diagnosis not present

## 2022-09-08 DIAGNOSIS — F418 Other specified anxiety disorders: Secondary | ICD-10-CM | POA: Diagnosis not present

## 2022-09-08 DIAGNOSIS — E1165 Type 2 diabetes mellitus with hyperglycemia: Secondary | ICD-10-CM | POA: Diagnosis not present

## 2022-09-08 DIAGNOSIS — I10 Essential (primary) hypertension: Secondary | ICD-10-CM | POA: Diagnosis not present

## 2022-09-09 IMAGING — CT CT CTA ABD/PEL W/CM AND/OR W/O CM
1 of 3 series · 14 of 32 positions shown, 18 images · IV contrast (APPLIED)
Comparison: CT 06/02/2019

CLINICAL DATA: Evaluate splenic artery aneurysm.

EXAM:
CTA ABDOMEN AND PELVIS WITHOUT AND WITH CONTRAST
TECHNIQUE: Multidetector CT imaging of the abdomen and pelvis was performed
using the standard protocol during bolus administration of
intravenous contrast. Multiplanar reconstructed images and MIPs were
obtained and reviewed to evaluate the vascular anatomy.
CONTRAST:  75mL HN34TJ-50D IOPAMIDOL (HN34TJ-50D) INJECTION 76%

[Series 5: pre stent angio · axial · non-contrast · 0.91mm/px · z∈[-467,-71]mm · 14 of 220 slices shown, 18 images]
[im 11/220  soft-tissue]
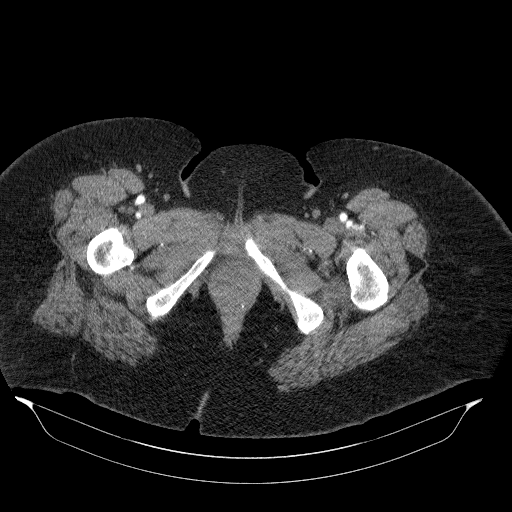
[im 11/220  bone]
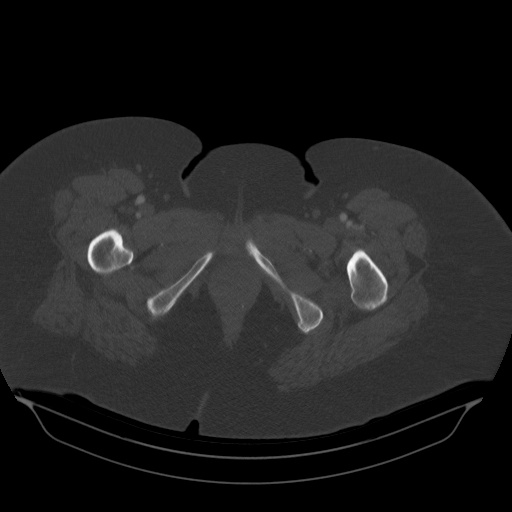
[im 32/220  soft-tissue]
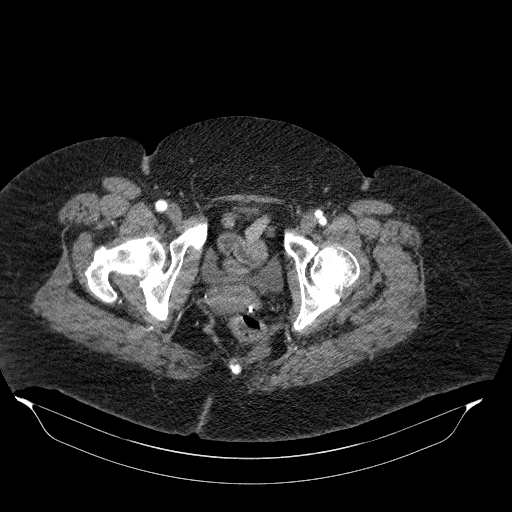
[im 53/220  soft-tissue]
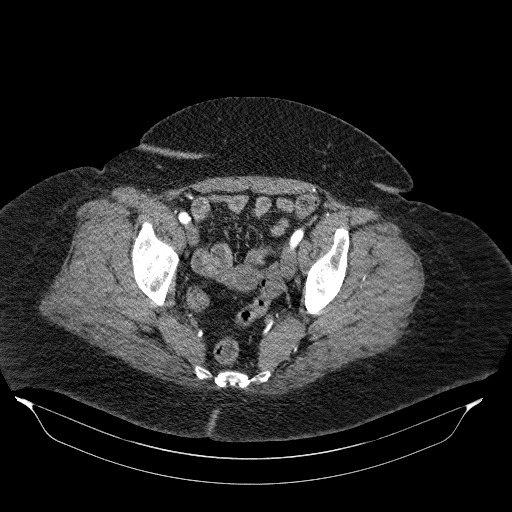
[im 63/220  soft-tissue]
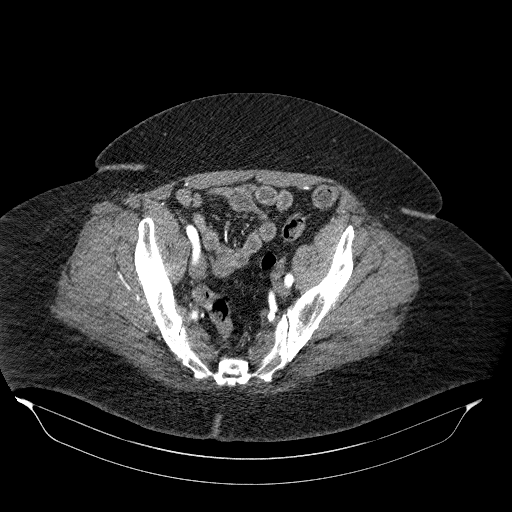
[im 84/220  soft-tissue]
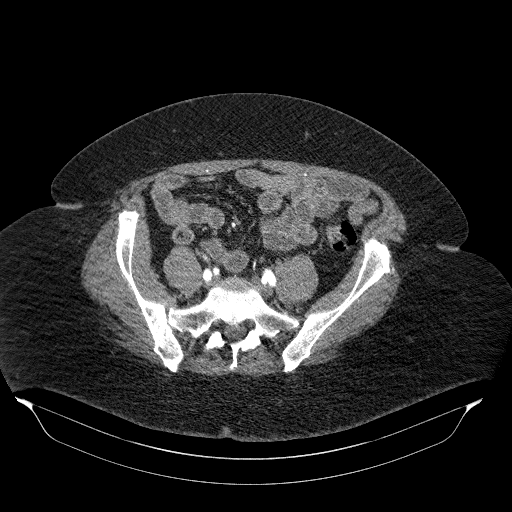
[im 105/220  soft-tissue]
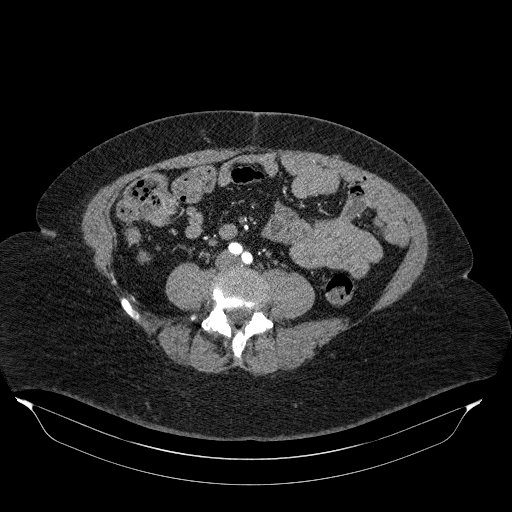
[im 115/220  soft-tissue]
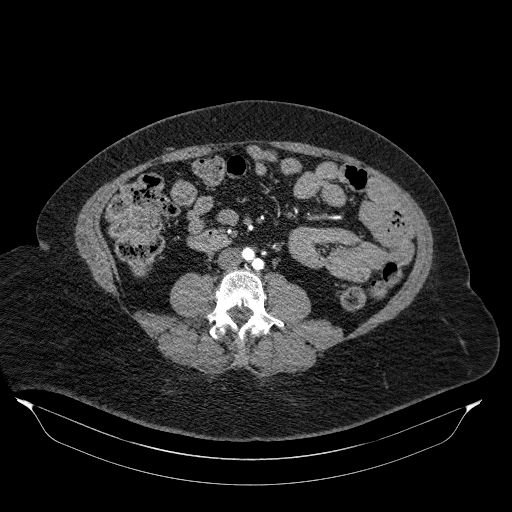
[im 136/220  soft-tissue]
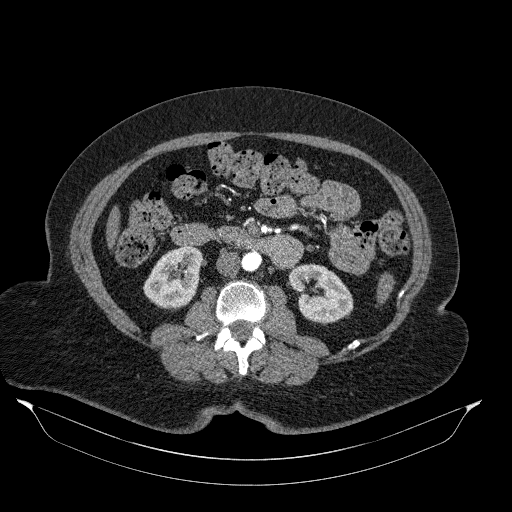
[im 157/220  soft-tissue]
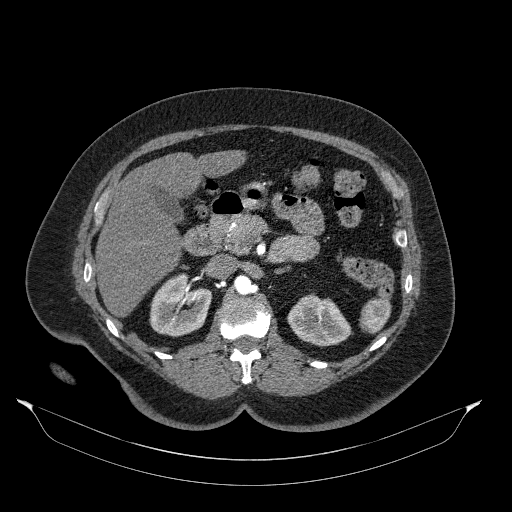
[im 157/220  bone]
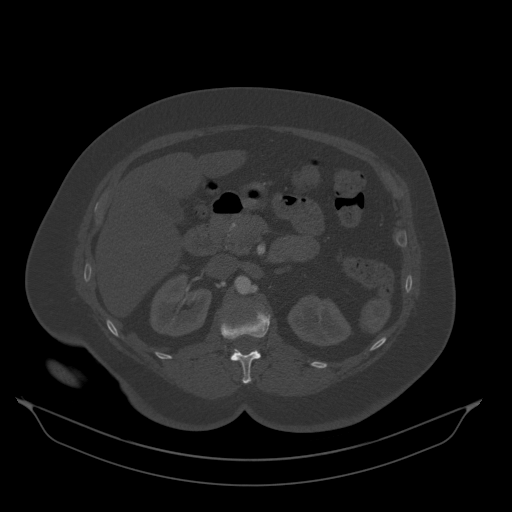
[im 167/220  soft-tissue]
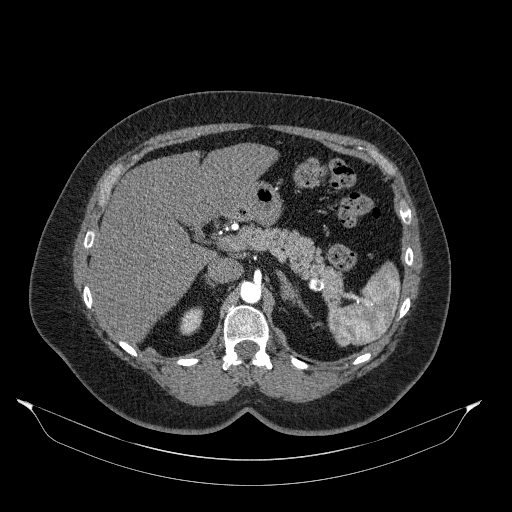
[im 178/220  lung]
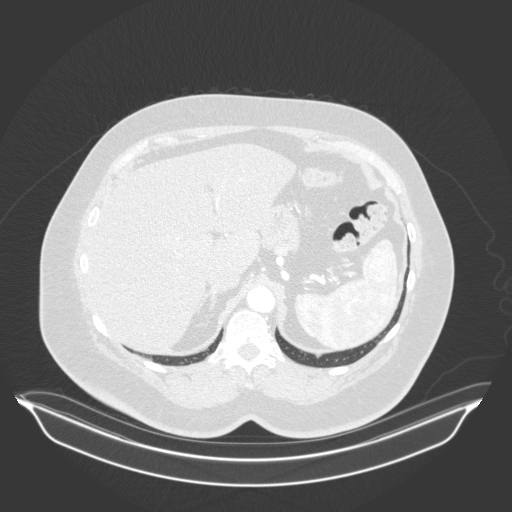
[im 188/220  soft-tissue]
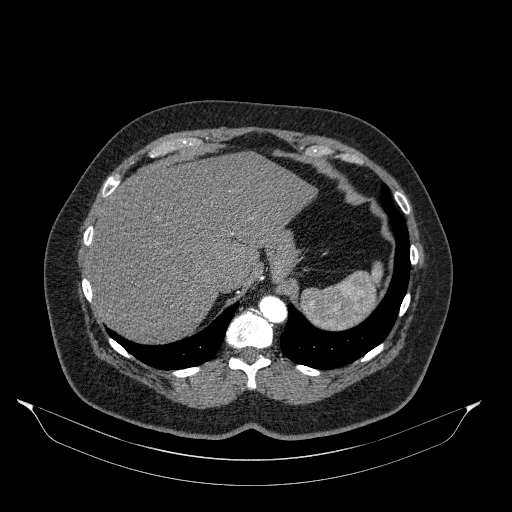
[im 188/220  lung]
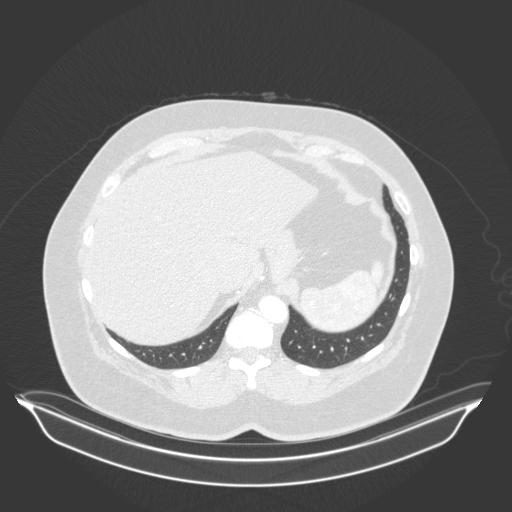
[im 199/220  lung]
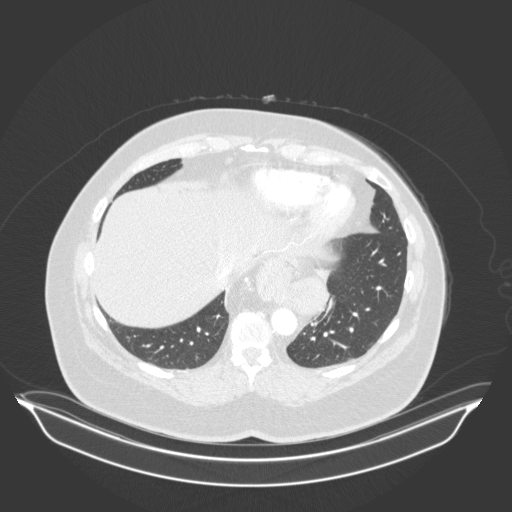
[im 209/220  soft-tissue]
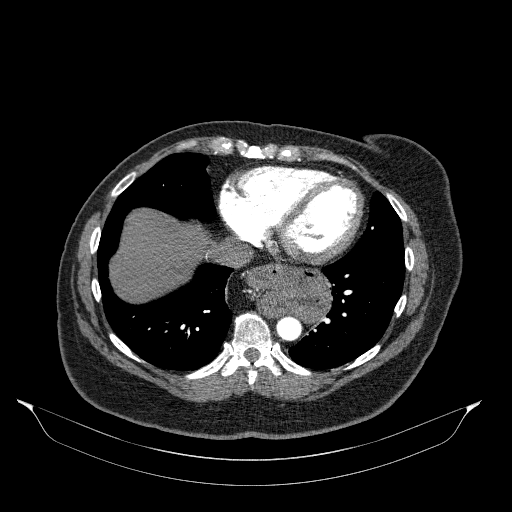
[im 209/220  lung]
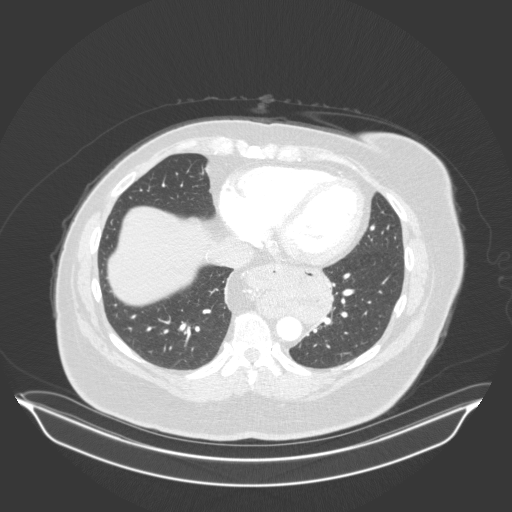

[14 of 32 positions shown; findings below may reference images not displayed]

FINDINGS: VASCULAR

Aorta: Normal caliber aorta without aneurysm, dissection, vasculitis
or significant stenosis.

Celiac: Celiac trunk is patent. Left gastric artery, common hepatic
artery and splenic artery are patent. Saccular aneurysm coming off
of the mid/distal splenic artery has peripheral calcifications and
no definite blood flow within the lumen. This saccular aneurysm
measures up to 1.8 cm and stable. No flow identified within saccular
aneurysm even on the delayed images. Findings are compatible with a
thrombosed splenic artery aneurysm.

SMA: Patent without evidence of aneurysm, dissection, vasculitis or
significant stenosis.

Renals: Bilateral renal arteries are patent without evidence of
stenosis, dissection or aneurysm. Single right renal artery. There
is a main left renal artery and a small accessory left renal artery.

IMA: Patent without evidence of aneurysm, dissection, vasculitis or
significant stenosis.

Inflow: Patent without evidence of aneurysm, dissection, vasculitis
or significant stenosis.

Proximal Outflow: Proximal femoral arteries are patent bilaterally.

Veins: Limited evaluation of the venous structures. No gross
abnormality to the visualized IVC and the renal veins.

Review of the MIP images confirms the above findings.

NON-VASCULAR

Lower chest: Lung bases are clear.  Large hiatal hernia.

Hepatobiliary: Normal appearance of the liver and gallbladder. No
significant biliary dilatation.

Pancreas: Unremarkable. No pancreatic ductal dilatation or
surrounding inflammatory changes.

Spleen: Normal in size without focal abnormality.

Adrenals/Urinary Tract: Normal adrenal glands. Normal appearance of
both kidneys without hydronephrosis. No suspicious renal lesions.
Small amount of fluid in the urinary bladder.

Stomach/Bowel: Stomach is within normal limits. No evidence of bowel
wall thickening, distention, or inflammatory changes.

Lymphatic: No abdominal or pelvic lymph node enlargement.

Reproductive: Uterus and bilateral adnexa are unremarkable.

Other: Negative for free fluid.

Musculoskeletal: No acute bone abnormality.
IMPRESSION: IMPRESSION
1. Thrombosed saccular splenic artery aneurysm. This thrombosed
aneurysm is unchanged in size measuring 1.8 cm.
2. Large hiatal hernia.
3. No acute abnormality in the abdomen or pelvis.

## 2022-09-11 DIAGNOSIS — M25512 Pain in left shoulder: Secondary | ICD-10-CM | POA: Diagnosis not present

## 2022-09-17 DIAGNOSIS — F411 Generalized anxiety disorder: Secondary | ICD-10-CM | POA: Diagnosis not present

## 2022-09-18 ENCOUNTER — Encounter (INDEPENDENT_AMBULATORY_CARE_PROVIDER_SITE_OTHER): Payer: Self-pay | Admitting: Family Medicine

## 2022-09-18 ENCOUNTER — Ambulatory Visit (INDEPENDENT_AMBULATORY_CARE_PROVIDER_SITE_OTHER): Payer: Medicare PPO | Admitting: Family Medicine

## 2022-09-18 VITALS — BP 104/71 | HR 69 | Temp 97.5°F | Ht 65.0 in | Wt 224.0 lb

## 2022-09-18 DIAGNOSIS — R5383 Other fatigue: Secondary | ICD-10-CM

## 2022-09-18 DIAGNOSIS — Z6837 Body mass index (BMI) 37.0-37.9, adult: Secondary | ICD-10-CM | POA: Diagnosis not present

## 2022-09-18 DIAGNOSIS — E669 Obesity, unspecified: Secondary | ICD-10-CM | POA: Diagnosis not present

## 2022-09-18 DIAGNOSIS — M25512 Pain in left shoulder: Secondary | ICD-10-CM | POA: Diagnosis not present

## 2022-09-22 DIAGNOSIS — M25512 Pain in left shoulder: Secondary | ICD-10-CM | POA: Diagnosis not present

## 2022-09-22 NOTE — Progress Notes (Unsigned)
Chief Complaint:   OBESITY Barbara Jackson is here to discuss her progress with her obesity treatment plan along with follow-up of her obesity related diagnoses. Barbara Jackson is on the Category 3 Plan and states she is following her eating plan approximately 20% of the time. Barbara Jackson states she is walking and recumbent bike for 20 minutes 3-4 times per week.  Today's visit was #: 10 Starting weight: 242 lbs Starting date: 6/21/223 Today's weight: 224 lbs Today's date: 09/18/2022 Total lbs lost to date: 18 Total lbs lost since last in-office visit: 0  Interim History: Barbara Jackson has done well with maintaining her weight loss. She struggles with meal planning and she is working on increasing her protein and she feels she is doing better overall. She still needs to increase her vegetables.   Subjective:   1. Other fatigue Barbara Jackson has moderate obstructive sleep apnea, and she will be using a mouth appliance in the next month.   Assessment/Plan:   1. Other fatigue Barbara Jackson was educated on obstructive sleep apnea and fatigue, and how it decreased RMR and causes weight gain. We will continue to follow to see how her treatment is working. I recommended a SaO2 sensing watch so that she can monitor her O2 sats overnight.   2. BMI 37.0-37.9, adult  3. Obesity, Beginning BMI 40.27 Ailany is currently in the action stage of change. As such, her goal is to continue with weight loss efforts. She has agreed to the Category 3 Plan.   Vegetable ideas were discussed.   Exercise goals: As is.   Behavioral modification strategies: increasing lean protein intake and increasing vegetables.  Barbara Jackson has agreed to follow-up with our clinic in 4 weeks. She was informed of the importance of frequent follow-up visits to maximize her success with intensive lifestyle modifications for her multiple health conditions.   Objective:   Blood pressure 104/71, pulse 69, temperature (!) 97.5 F (36.4 C), height 5\' 5"  (1.651 m), weight 224  lb (101.6 kg), last menstrual period 06/16/2005, SpO2 95 %. Body mass index is 37.28 kg/m.  Lab Results  Component Value Date   CREATININE 0.89 12/04/2021   BUN 16 12/04/2021   NA 139 12/04/2021   K 4.2 12/04/2021   CL 97 12/04/2021   CO2 21 12/04/2021   Lab Results  Component Value Date   ALT 29 12/04/2021   AST 22 12/04/2021   ALKPHOS 66 12/04/2021   BILITOT 0.4 12/04/2021   Lab Results  Component Value Date   HGBA1C 6.4 (A) 09/01/2022   HGBA1C 6.3 (A) 04/28/2022   HGBA1C 6.6 (H) 12/04/2021   HGBA1C 6.4 (A) 02/11/2021   HGBA1C 6.1 (A) 09/19/2020   Lab Results  Component Value Date   INSULIN 11.8 12/04/2021   Lab Results  Component Value Date   TSH 2.320 12/04/2021   Lab Results  Component Value Date   CHOL 174 08/13/2021   HDL 84.60 08/13/2021   LDLDIRECT 33.0 08/13/2021   TRIG 217.0 (H) 08/13/2021   CHOLHDL 2 08/13/2021   Lab Results  Component Value Date   VD25OH 27.6 (L) 12/04/2021   Lab Results  Component Value Date   WBC 12.5 (H) 01/12/2019   HGB 12.2 01/12/2019   HCT 37.3 01/12/2019   MCV 86.5 01/12/2019   PLT 417 (H) 01/12/2019   Lab Results  Component Value Date   FERRITIN 22 12/04/2017   Attestation Statements:   Reviewed by clinician on day of visit: allergies, medications, problem list, medical history, surgical  history, family history, social history, and previous encounter notes.   I, Burt Knack, am acting as transcriptionist for Quillian Quince, MD.  I have reviewed the above documentation for accuracy and completeness, and I agree with the above. -  Quillian Quince, MD

## 2022-09-22 NOTE — Progress Notes (Deleted)
67 y.o. G26P1001 Single Caucasian female here for annual exam.    PCP:     Patient's last menstrual period was 06/16/2005.           Sexually active: {yes no:314532}  The current method of family planning is post menopausal status.    Exercising: {yes no:314532}  {types:19826} Smoker:  no  Health Maintenance: Pap:  10/02/21 neg: HR HPV neg, 12/04/17 neg: HR HPV neg History of abnormal Pap:  yes, 1998 MMG:  11/19/21 Breast Density Cat C, BI-RADS CAT 1 neg Colonoscopy:  11/12/16 BMD:   n/a  Result  n/a TDaP:  04/08/06 Gardasil:   no HIV: n/a Hep C: 12/04/17 NR Screening Labs:  Hb today: ***, Urine today: ***   reports that she has never smoked. She has never used smokeless tobacco. She reports current alcohol use of about 2.0 standard drinks of alcohol per week. She reports that she does not use drugs.  Past Medical History:  Diagnosis Date   Abnormal pap 1998   Negative Hpv   Anxiety    Arthritis    [name], chronic    Diabetes mellitus    Dysrhythmia    hx of atrial tachycardia and pacs   Esophageal reflux    H/O hiatal hernia    H/O iron deficiency anemia 08/2016   Hypercholesteremia    Hypertension    Obesity    compulsive overeater   Osteoarthritis     Past Surgical History:  Procedure Laterality Date   APPENDECTOMY  1975   TOTAL KNEE ARTHROPLASTY  01/12/2012   Procedure: TOTAL KNEE ARTHROPLASTY;  Surgeon: Loanne Drilling, MD;  Location: [facility];  Service: Orthopedics;  Laterality: Left;   TOTAL KNEE ARTHROPLASTY Right 01/10/2019   Procedure: TOTAL KNEE ARTHROPLASTY;  Surgeon: Ollen Gross, MD;  Location: [facility];  Service: Orthopedics;  Laterality: Right;     Current Outpatient Medications  Medication Sig Dispense Refill   albuterol (VENTOLIN HFA) 108 (90 Base) MCG/ACT inhaler Inhale 2 puffs into the lungs every 6 (six) hours as needed for wheezing or shortness of breath.      atorvastatin (LIPITOR) 20 MG tablet Take 20 mg by mouth daily with supper.    0   Blood Glucose Monitoring Suppl (ONE TOUCH ULTRA 2) w/Device KIT Use to check blood sugar 2-3 times a day 1 each 0   diazepam (VALIUM) 5 MG tablet Take 5-10 mg by mouth every 8 (eight) hours as needed for anxiety.     diphenhydrAMINE (BENADRYL) 25 mg capsule Take 25 mg by mouth every 6 (six) hours as needed for allergies.      fluticasone (FLONASE) 50 MCG/ACT nasal spray Place 2 sprays into both nostrils daily as needed for allergies.      FREESTYLE TEST STRIPS test strip   5   Iron-FA-B Cmp-C-Biot-Probiotic (FUSION PLUS) CAPS Take 1 capsule by mouth daily.      losartan-hydrochlorothiazide (HYZAAR) 100-25 MG per tablet Take 1 tablet by mouth every morning.     metFORMIN (GLUCOPHAGE-XR) 500 MG 24 hr tablet TAKE 4 TABLETS BY MOUTH DAILY WITH SUPPER. 360 tablet 3   omeprazole (PRILOSEC) 40 MG capsule Take 40 mg by mouth daily before breakfast.     potassium chloride (KLOR-CON) 10 MEQ tablet Take 10 mEq by mouth daily.     sertraline (ZOLOFT) 50 MG tablet Take 50 mg by mouth daily with supper.     terbinafine (LAMISIL) 250 MG tablet Please take one a day x 7days,  repeat every 4 weeks x 4 months 28 tablet 0   tirzepatide (MOUNJARO) 12.5 MG/0.5ML Pen INJECT 12.5 MG SUBCUTANEOUSLY ONE TIME PER WEEK 6 mL 3   Vitamin D, Ergocalciferol, (DRISDOL) 1.25 MG (50000 UNIT) CAPS capsule Take 1 capsule (50,000 Units total) by mouth every 7 (seven) days. 12 capsule 0   Vitamin D, Ergocalciferol, (DRISDOL) 1.25 MG (50000 UNIT) CAPS capsule Take 1 capsule (50,000 Units total) by mouth every 7 (seven) days. 12 capsule 0   zolpidem (AMBIEN) 10 MG tablet Take 10 mg by mouth at bedtime as needed for sleep.      No current facility-administered medications for this visit.    Family History  Problem Relation Age of Onset   Breast cancer Mother 59       Deceased, 55   Heart attack Father        Deceased, 55   COPD Father    Obesity Sister    Breast cancer Cousin 56       maternal 1st cousin   Hypertension  Brother    Healthy Son     Review of Systems  Exam:   LMP 06/16/2005     General appearance: alert, cooperative and appears stated age Head: normocephalic, without obvious abnormality, atraumatic Neck: no adenopathy, supple, symmetrical, trachea midline and thyroid normal to inspection and palpation Lungs: clear to auscultation bilaterally Breasts: normal appearance, no masses or tenderness, No nipple retraction or dimpling, No nipple discharge or bleeding, No axillary adenopathy Heart: regular rate and rhythm Abdomen: soft, non-tender; no masses, no organomegaly Extremities: extremities normal, atraumatic, no cyanosis or edema Skin: skin color, texture, turgor normal. No rashes or lesions Lymph nodes: cervical, supraclavicular, and axillary nodes normal. Neurologic: grossly normal  Pelvic: External genitalia:  no lesions              No abnormal inguinal nodes palpated.              Urethra:  normal appearing urethra with no masses, tenderness or lesions              Bartholins and Skenes: normal                 Vagina: normal appearing vagina with normal color and discharge, no lesions              Cervix: no lesions              Pap taken: {yes no:314532} Bimanual Exam:  Uterus:  normal size, contour, position, consistency, mobility, non-tender              Adnexa: no mass, fullness, tenderness              Rectal exam: {yes no:314532}.  Confirms.              Anus:  normal sphincter tone, no lesions  Chaperone was present for exam:  ***  Assessment:   Well woman visit with gynecologic exam.   Plan: Mammogram screening discussed. Self breast awareness reviewed. Pap and HR HPV as above. Guidelines for Calcium, Vitamin D, regular exercise program including cardiovascular and weight bearing exercise.   Follow up annually and prn.   Additional counseling given.  {yes [id number]. _______ minutes face to face time of which over 50% was spent in counseling.    After  visit summary provided.

## 2022-09-30 DIAGNOSIS — G4733 Obstructive sleep apnea (adult) (pediatric): Secondary | ICD-10-CM | POA: Diagnosis not present

## 2022-09-30 DIAGNOSIS — M25512 Pain in left shoulder: Secondary | ICD-10-CM | POA: Diagnosis not present

## 2022-10-06 ENCOUNTER — Ambulatory Visit: Payer: Medicare PPO | Admitting: Obstetrics and Gynecology

## 2022-10-08 ENCOUNTER — Telehealth: Payer: Self-pay | Admitting: Gastroenterology

## 2022-10-09 DIAGNOSIS — M25512 Pain in left shoulder: Secondary | ICD-10-CM | POA: Diagnosis not present

## 2022-10-14 DIAGNOSIS — G4733 Obstructive sleep apnea (adult) (pediatric): Secondary | ICD-10-CM | POA: Diagnosis not present

## 2022-10-14 DIAGNOSIS — F411 Generalized anxiety disorder: Secondary | ICD-10-CM | POA: Diagnosis not present

## 2022-10-14 DIAGNOSIS — M25512 Pain in left shoulder: Secondary | ICD-10-CM | POA: Diagnosis not present

## 2022-10-20 ENCOUNTER — Telehealth: Payer: Self-pay | Admitting: Gastroenterology

## 2022-10-20 NOTE — Telephone Encounter (Signed)
Error.Marland KitchenMarland KitchenSomeone opened and it was left blank.

## 2022-10-21 NOTE — Progress Notes (Unsigned)
TeleHealth Visit:  This visit was completed with telemedicine (audio/video) technology. [name] has verbally consented to this TeleHealth visit. The patient is located at home, the provider is located at home. The participants in this visit include the listed provider and patient. The visit was conducted today via MyChart video.  OBESITY Barbara Jackson is here to discuss her progress with her obesity treatment plan along with follow-up of her obesity related diagnoses.   Today's visit was # 11 Starting weight: 242 lbs Starting date: 6/21/223 Weight at last in office visit: 224 lbs on 09/18/22 Total weight loss: 18 lbs at last in office visit on 09/18/22. Today's reported weight (10/22/22): none reported  Nutrition Plan: the Category 3 plan and the vegetarian plan  Current exercise:  walking and recumbent bike for 20-30 minutes 5 times per week  Interim History:  She has deviated from plan more than usual. She has been snacking more (chocolate) and not sticking to plan at mealtime.  She does both the category 3 and the vegetarian plan.   She drinks tea with a small amount of sugar and also water.  She makes the tea at home She lives alone and doesn't cook much. She doesn't like eating as much meat as  is recommended on the category 3 plan.  She has journaled quite a bit in the past but does not particularly like it.  On Mounjaro 12.5 mg weekly and notes good satiety and control of hunger. Assessment/Plan:  1. Type 2 Diabetes Mellitus with other specified complication, without long-term current use of insulin HgbA1c is at goal. Last A1c was 6.4 on 09/01/2022. Medication(s): Mounjaro 12.5 mg weekly and metformin XR 2000 mg at supper.  Lab Results  Component Value Date   HGBA1C 6.4 (A) 09/01/2022   HGBA1C 6.3 (A) 04/28/2022   HGBA1C 6.6 (H) 12/04/2021   Lab Results  Component Value Date   MICROALBUR 0.8 08/13/2021   CREATININE 0.89 12/04/2021   Lab Results  Component Value Date    GFR 77.27 08/13/2021    Plan: Continue Mounjaro 12.5 weekly and metformin XR 2000 mg at supper.   2. Morbid Obesity: Current BMI 37  Exer is currently in the action stage of change. As such, her goal is to continue with weight loss efforts.  She has agreed to the Category 3 plan and the vegetarian plan.  1.  Discussed using meal prep services such as hello fresh, factor, whole foods. 2.  Encouraged her to keep track of snack calories and keep them less than 300. 3.  Discussed some vegetarian meal ideas.  Exercise goals: Work up to 30 minutes of cardio 5 times per week.  Behavioral modification strategies: increasing lean protein intake, decreasing simple carbohydrates , meal planning , and planning for success.  Esteban has agreed to follow-up with our clinic in 5 weeks.   No orders of the defined types were placed in this encounter.   There are no discontinued medications.   No orders of the defined types were placed in this encounter.     Objective:   VITALS: Per patient if applicable, see vitals. GENERAL: Alert and in no acute distress. CARDIOPULMONARY: No increased WOB. Speaking in clear sentences.  PSYCH: Pleasant and cooperative. Speech normal rate and rhythm. Affect is appropriate. Insight and judgement are appropriate. Attention is focused, linear, and appropriate.  NEURO: Oriented as arrived to appointment on time with no prompting.   Attestation Statements:   Reviewed by clinician on day of visit: allergies, medications, problem  list, medical history, surgical history, family history, social history, and previous encounter notes.   This was prepared with the assistance of Engineer, civil (consulting).  Occasional wrong-word or sound-a-like substitutions may have occurred due to the inherent limitations of voice recognition software.

## 2022-10-22 ENCOUNTER — Telehealth (INDEPENDENT_AMBULATORY_CARE_PROVIDER_SITE_OTHER): Payer: Medicare PPO | Admitting: Family Medicine

## 2022-10-22 ENCOUNTER — Encounter (INDEPENDENT_AMBULATORY_CARE_PROVIDER_SITE_OTHER): Payer: Self-pay | Admitting: Family Medicine

## 2022-10-22 DIAGNOSIS — E1169 Type 2 diabetes mellitus with other specified complication: Secondary | ICD-10-CM | POA: Diagnosis not present

## 2022-10-22 DIAGNOSIS — M25512 Pain in left shoulder: Secondary | ICD-10-CM | POA: Diagnosis not present

## 2022-10-22 DIAGNOSIS — Z7985 Long-term (current) use of injectable non-insulin antidiabetic drugs: Secondary | ICD-10-CM | POA: Diagnosis not present

## 2022-10-22 DIAGNOSIS — Z6837 Body mass index (BMI) 37.0-37.9, adult: Secondary | ICD-10-CM | POA: Diagnosis not present

## 2022-10-23 NOTE — Telephone Encounter (Signed)
Error-spoke with patient she is asking for [place].

## 2022-10-24 ENCOUNTER — Telehealth: Payer: Self-pay | Admitting: Gastroenterology

## 2022-10-24 NOTE — Telephone Encounter (Signed)
I can see her in the office for new patient visit.  Thanks

## 2022-10-24 NOTE — Telephone Encounter (Signed)
Hi Dr. Adela Lank,  Patient called to request a transfer of care specifically over to you if possible from Dr. Kenna Gilbert office. Records were obtained and scanned into Media for you to review and advise on scheduling.   Thanks

## 2022-10-28 NOTE — Telephone Encounter (Signed)
Called patient to schedule left voicemail. 

## 2022-10-29 ENCOUNTER — Encounter: Payer: Self-pay | Admitting: Gastroenterology

## 2022-10-30 DIAGNOSIS — M25512 Pain in left shoulder: Secondary | ICD-10-CM | POA: Diagnosis not present

## 2022-11-03 DIAGNOSIS — G4733 Obstructive sleep apnea (adult) (pediatric): Secondary | ICD-10-CM | POA: Diagnosis not present

## 2022-11-03 DIAGNOSIS — M7542 Impingement syndrome of left shoulder: Secondary | ICD-10-CM | POA: Diagnosis not present

## 2022-11-04 DIAGNOSIS — F411 Generalized anxiety disorder: Secondary | ICD-10-CM | POA: Diagnosis not present

## 2022-11-18 DIAGNOSIS — G4733 Obstructive sleep apnea (adult) (pediatric): Secondary | ICD-10-CM | POA: Diagnosis not present

## 2022-11-21 ENCOUNTER — Other Ambulatory Visit (INDEPENDENT_AMBULATORY_CARE_PROVIDER_SITE_OTHER): Payer: Self-pay | Admitting: Family Medicine

## 2022-11-21 DIAGNOSIS — E559 Vitamin D deficiency, unspecified: Secondary | ICD-10-CM

## 2022-11-24 DIAGNOSIS — E1136 Type 2 diabetes mellitus with diabetic cataract: Secondary | ICD-10-CM | POA: Diagnosis not present

## 2022-11-24 DIAGNOSIS — H40013 Open angle with borderline findings, low risk, bilateral: Secondary | ICD-10-CM | POA: Diagnosis not present

## 2022-11-24 DIAGNOSIS — Z7984 Long term (current) use of oral hypoglycemic drugs: Secondary | ICD-10-CM | POA: Diagnosis not present

## 2022-11-24 DIAGNOSIS — H524 Presbyopia: Secondary | ICD-10-CM | POA: Diagnosis not present

## 2022-11-24 DIAGNOSIS — H5213 Myopia, bilateral: Secondary | ICD-10-CM | POA: Diagnosis not present

## 2022-11-24 DIAGNOSIS — H52203 Unspecified astigmatism, bilateral: Secondary | ICD-10-CM | POA: Diagnosis not present

## 2022-11-24 DIAGNOSIS — H25813 Combined forms of age-related cataract, bilateral: Secondary | ICD-10-CM | POA: Diagnosis not present

## 2022-11-24 DIAGNOSIS — Z7985 Long-term (current) use of injectable non-insulin antidiabetic drugs: Secondary | ICD-10-CM | POA: Diagnosis not present

## 2022-11-24 LAB — HM DIABETES EYE EXAM

## 2022-11-26 ENCOUNTER — Encounter (INDEPENDENT_AMBULATORY_CARE_PROVIDER_SITE_OTHER): Payer: Self-pay | Admitting: Family Medicine

## 2022-11-26 ENCOUNTER — Ambulatory Visit (INDEPENDENT_AMBULATORY_CARE_PROVIDER_SITE_OTHER): Payer: Medicare PPO | Admitting: Family Medicine

## 2022-11-26 VITALS — BP 105/70 | HR 76 | Temp 98.3°F | Ht 65.0 in | Wt 222.0 lb

## 2022-11-26 DIAGNOSIS — Z6836 Body mass index (BMI) 36.0-36.9, adult: Secondary | ICD-10-CM | POA: Diagnosis not present

## 2022-11-26 DIAGNOSIS — E669 Obesity, unspecified: Secondary | ICD-10-CM | POA: Diagnosis not present

## 2022-11-26 DIAGNOSIS — E1169 Type 2 diabetes mellitus with other specified complication: Secondary | ICD-10-CM

## 2022-11-26 DIAGNOSIS — Z7985 Long-term (current) use of injectable non-insulin antidiabetic drugs: Secondary | ICD-10-CM | POA: Diagnosis not present

## 2022-11-26 DIAGNOSIS — E559 Vitamin D deficiency, unspecified: Secondary | ICD-10-CM

## 2022-11-26 DIAGNOSIS — Z6837 Body mass index (BMI) 37.0-37.9, adult: Secondary | ICD-10-CM

## 2022-11-26 NOTE — Progress Notes (Signed)
Chief Complaint:   OBESITY Barbara Jackson is here to discuss her progress with her obesity treatment plan along with follow-up of her obesity related diagnoses. Barbara Jackson is on the Category 3 Plan and states she is following her eating plan approximately 10% of the time. Barbara Jackson states she is walking, swimming, and biking for 20-30 minutes 4-5 times per week.  Today's visit was #: 12 Starting weight: 242 lbs Starting date: 12/04/2021 Today's weight: 222 lbs Today's date: 11/26/2022 Total lbs lost to date: 20 Total lbs lost since last in-office visit: 2  Interim History: Patient has not been able to concentrate on her meal plan.  She has extra stress at work but she has still been exercising regularly.  Subjective:   1. Type 2 diabetes mellitus with other specified complication, without long-term current use of insulin (HCC) Patient is on Mounjaro, and she is doing well with her diet.  She denies nausea or vomiting.  2. Vitamin D deficiency Patient is on vitamin D with no side effects noted.  Assessment/Plan:   1. Type 2 diabetes mellitus with other specified complication, without long-term current use of insulin (HCC) Patient will continue Mounjaro, and she will continue to work on increasing her protein intake.  2. Vitamin D deficiency Patient will continue vitamin D and we will refill as needed.  3. BMI 37.0-37.9, adult  4. Obesity, Beginning BMI 40.27 Barbara Jackson is currently in the action stage of change. As such, her goal is to continue with weight loss efforts. She has agreed to the Category 3 Plan.   Protein rich recipe ideas was given.   Exercise goals: As is.   Behavioral modification strategies: increasing lean protein intake, no skipping meals, and meal planning and cooking strategies.  Barbara Jackson has agreed to follow-up with our clinic in 4 weeks. She was informed of the importance of frequent follow-up visits to maximize her success with intensive lifestyle modifications for her  multiple health conditions.   Objective:   Blood pressure 105/70, pulse 76, temperature 98.3 F (36.8 C), height 5\' 5"  (1.651 m), weight 222 lb (100.7 kg), last menstrual period 06/16/2005, SpO2 97 %. Body mass index is 36.94 kg/m.  Lab Results  Component Value Date   CREATININE 0.89 12/04/2021   BUN 16 12/04/2021   NA 139 12/04/2021   K 4.2 12/04/2021   CL 97 12/04/2021   CO2 21 12/04/2021   Lab Results  Component Value Date   ALT 29 12/04/2021   AST 22 12/04/2021   ALKPHOS 66 12/04/2021   BILITOT 0.4 12/04/2021   Lab Results  Component Value Date   HGBA1C 6.4 (A) 09/01/2022   HGBA1C 6.3 (A) 04/28/2022   HGBA1C 6.6 (H) 12/04/2021   HGBA1C 6.4 (A) 02/11/2021   HGBA1C 6.1 (A) 09/19/2020   Lab Results  Component Value Date   INSULIN 11.8 12/04/2021   Lab Results  Component Value Date   TSH 2.320 12/04/2021   Lab Results  Component Value Date   CHOL 174 08/13/2021   HDL 84.60 08/13/2021   LDLDIRECT 33.0 08/13/2021   TRIG 217.0 (H) 08/13/2021   CHOLHDL 2 08/13/2021   Lab Results  Component Value Date   VD25OH 27.6 (L) 12/04/2021   Lab Results  Component Value Date   WBC 12.5 (H) 01/12/2019   HGB 12.2 01/12/2019   HCT 37.3 01/12/2019   MCV 86.5 01/12/2019   PLT 417 (H) 01/12/2019   Lab Results  Component Value Date   FERRITIN 22 12/04/2017  Attestation Statements:   Reviewed by clinician on day of visit: allergies, medications, problem list, medical history, surgical history, family history, social history, and previous encounter notes.  Time spent on visit including pre-visit chart review and post-visit care and charting was 30 minutes.   I, Trixie Dredge, am acting as transcriptionist for Dennard Nip, MD.  I have reviewed the above documentation for accuracy and completeness, and I agree with the above. -  Dennard Nip, MD

## 2022-11-27 ENCOUNTER — Encounter (INDEPENDENT_AMBULATORY_CARE_PROVIDER_SITE_OTHER): Payer: Self-pay | Admitting: Family Medicine

## 2022-12-08 ENCOUNTER — Ambulatory Visit: Payer: Medicare PPO | Admitting: Surgery

## 2022-12-23 DIAGNOSIS — F411 Generalized anxiety disorder: Secondary | ICD-10-CM | POA: Diagnosis not present

## 2022-12-24 ENCOUNTER — Other Ambulatory Visit: Payer: Self-pay

## 2022-12-24 DIAGNOSIS — I728 Aneurysm of other specified arteries: Secondary | ICD-10-CM

## 2022-12-29 ENCOUNTER — Encounter: Payer: Self-pay | Admitting: Obstetrics and Gynecology

## 2022-12-29 DIAGNOSIS — Z1231 Encounter for screening mammogram for malignant neoplasm of breast: Secondary | ICD-10-CM

## 2022-12-31 ENCOUNTER — Ambulatory Visit (INDEPENDENT_AMBULATORY_CARE_PROVIDER_SITE_OTHER): Payer: Medicare PPO | Admitting: Family Medicine

## 2022-12-31 ENCOUNTER — Encounter (INDEPENDENT_AMBULATORY_CARE_PROVIDER_SITE_OTHER): Payer: Self-pay | Admitting: Family Medicine

## 2022-12-31 ENCOUNTER — Other Ambulatory Visit (INDEPENDENT_AMBULATORY_CARE_PROVIDER_SITE_OTHER): Payer: Self-pay | Admitting: Family Medicine

## 2022-12-31 VITALS — BP 112/66 | HR 74 | Temp 97.8°F | Ht 65.0 in | Wt 218.0 lb

## 2022-12-31 DIAGNOSIS — Z6836 Body mass index (BMI) 36.0-36.9, adult: Secondary | ICD-10-CM | POA: Diagnosis not present

## 2022-12-31 DIAGNOSIS — E1169 Type 2 diabetes mellitus with other specified complication: Secondary | ICD-10-CM | POA: Diagnosis not present

## 2022-12-31 DIAGNOSIS — G4733 Obstructive sleep apnea (adult) (pediatric): Secondary | ICD-10-CM | POA: Diagnosis not present

## 2022-12-31 DIAGNOSIS — E669 Obesity, unspecified: Secondary | ICD-10-CM | POA: Diagnosis not present

## 2022-12-31 DIAGNOSIS — Z7984 Long term (current) use of oral hypoglycemic drugs: Secondary | ICD-10-CM | POA: Diagnosis not present

## 2022-12-31 DIAGNOSIS — E559 Vitamin D deficiency, unspecified: Secondary | ICD-10-CM | POA: Diagnosis not present

## 2022-12-31 MED ORDER — VITAMIN D (ERGOCALCIFEROL) 1.25 MG (50000 UNIT) PO CAPS: 50000.0000 [IU] | ORAL_CAPSULE | ORAL | 0 refills | Status: DC

## 2022-12-31 NOTE — Progress Notes (Addendum)
.smr  Office: [phone]  /  Fax: [phone]  WEIGHT SUMMARY AND BIOMETRICS  Anthropometric Measurements Height: 5\' 5"  (1.651 m) Weight: 218 lb (98.9 kg) BMI (Calculated): 36.28 Weight at Last Visit: 222 lb Weight Lost Since Last Visit: 4 lb Weight Gained Since Last Visit: 0 Starting Weight: 242 lb Total Weight Loss (lbs): 24 lb (10.9 kg)   Body Composition  Body Fat %: 47.8 % Fat Mass (lbs): 104.4 lbs Muscle Mass (lbs): 108.4 lbs Total Body Water (lbs): 81.6 lbs Visceral Fat Rating : 15   Other Clinical Data Fasting: Yes Labs: Yes Today's Visit #: 13 Starting Date: 12/04/21    Chief Complaint: OBESITY     History of Present Illness   The patient is a 67 year old individual with a history of obesity, diabetes, and vitamin D deficiency. She has been following a category three plan for weight loss, which she adheres to approximately 20% of the time, and has lost four pounds in the last month. She has also increased her physical activity, engaging in walking, swimming, and biking for about 30 minutes five times per week. She is on metformin for diabetes management and has been working on improving her diet and exercise regimen.  The patient reports feeling full sooner than before, which she attributes to the effects of the [facility] medication. She has noticed this change in the last several weeks. She has been monitoring her blood sugars, which range from 100 to 220. She has expressed an interest in measuring her blood sugar levels after consuming large amounts of chocolate to understand the impact on her blood sugar levels.  The patient has been experimenting with protein powder smoothies and has found it easy to incorporate a cup of spinach into her diet through these smoothies. She has also expressed interest in starting magnesium supplementation to aid with sleep and has been considering the use of a continuous glucose monitor.  The patient has been dealing with  sleep issues and has undergone a sleep study in the past. She has been using an oral appliance, but it has not provided the expected improvement in her sleep quality. She has been considering other options, including the use of a CPAP machine. She has also been dealing with vitamin D deficiency, with her last recorded level being 23. She has been taking vitamin D supplements to address this deficiency.          PHYSICAL EXAM:  Blood pressure 112/66, pulse 74, temperature 97.8 F (36.6 C), height 5\' 5"  (1.651 m), weight 218 lb (98.9 kg), last menstrual period 06/16/2005, SpO2 96%. Body mass index is 36.28 kg/m.  DIAGNOSTIC DATA REVIEWED:  BMET    Component Value Date/Time   NA 139 12/04/2021 0904   K 4.2 12/04/2021 0904   CL 97 12/04/2021 0904   CO2 21 12/04/2021 0904   GLUCOSE 156 (H) 12/04/2021 0904   GLUCOSE 134 (H) 08/13/2021 1041   BUN 16 12/04/2021 0904   CREATININE 0.89 12/04/2021 0904   CALCIUM 10.4 (H) 12/04/2021 0904   GFRNONAA >60 01/12/2019 0711   GFRAA >60 01/12/2019 0711   Lab Results  Component Value Date   HGBA1C 6.4 (A) 09/01/2022   HGBA1C 6.1 (A) 05/03/2018   Lab Results  Component Value Date   INSULIN 11.8 12/04/2021   Lab Results  Component Value Date   TSH 2.320 12/04/2021   CBC    Component Value Date/Time   WBC 12.5 (H) 01/12/2019 0711   RBC 4.31 01/12/2019 0711  HGB 12.2 01/12/2019 0711   HGB 12.9 12/24/2017 1556   HCT 37.3 01/12/2019 0711   HCT 38.6 12/24/2017 1556   PLT 417 (H) 01/12/2019 0711   PLT 504 (H) 12/24/2017 1556   MCV 86.5 01/12/2019 0711   MCV 82 12/24/2017 1556   MCH 28.3 01/12/2019 0711   MCHC 32.7 01/12/2019 0711   RDW 13.9 01/12/2019 0711   RDW 14.7 12/24/2017 1556   Iron Studies    Component Value Date/Time   FERRITIN 22 12/04/2017 1618   Lipid Panel     Component Value Date/Time   CHOL 174 08/13/2021 1041   TRIG 217.0 (H) 08/13/2021 1041   HDL 84.60 08/13/2021 1041   CHOLHDL 2 08/13/2021 1041   VLDL  43.4 (H) 08/13/2021 1041   LDLDIRECT 33.0 08/13/2021 1041   Hepatic Function Panel     Component Value Date/Time   PROT 7.4 12/04/2021 0904   ALBUMIN 4.8 12/04/2021 0904   AST 22 12/04/2021 0904   ALT 29 12/04/2021 0904   ALKPHOS 66 12/04/2021 0904   BILITOT 0.4 12/04/2021 0904      Component Value Date/Time   TSH 2.320 12/04/2021 0904   Nutritional Lab Results  Component Value Date   VD25OH 27.6 (L) 12/04/2021     Assessment and Plan    Obesity: Noted weight loss of 4 pounds in the last month with adherence to category three plan approximately 20% of the time. Increased physical activity with walking, swimming, and biking for about 30 minutes five times per week. -Continue current diet and exercise regimen. -Consider increasing adherence to category three plan.  Type 2 Diabetes Mellitus: On Metformin. Fasting blood sugars around 140, with lowest at 110 and highest at 220. -Continue Metformin. -Check blood sugars after high carbohydrate intake to assess impact. -Consider continuous glucose monitoring for more detailed blood sugar trends.  Vitamin D Deficiency: Last Vitamin D level was 27. -Refill Vitamin D prescription. -Check Vitamin D level today.  Sleep Apnea: Using oral appliance with limited success. Considering another sleep study. -Consider consultation with sleep specialist for further management. Will continue weight loss as treatment for OSA  General Health Maintenance: -Consider adding magnesium supplement for potential benefits on sleep and constipation. -Check magnesium level today. -Follow-up in 5 weeks.         I have personally spent 42 minutes total time today in preparation, patient care, and documentation for this visit, including the following: review of clinical lab tests; review of medical tests/procedures/services.    She was informed of the importance of frequent follow up visits to maximize her success with intensive lifestyle modifications  for her multiple health conditions.    Quillian Quince, MD

## 2023-01-01 LAB — CMP14+EGFR
ALT: 20 IU/L (ref 0–32)
AST: 19 IU/L (ref 0–40)
Albumin: 4.4 g/dL (ref 3.9–4.9)
Alkaline Phosphatase: 64 IU/L (ref 44–121)
BUN/Creatinine Ratio: 22 (ref 12–28)
BUN: 19 mg/dL (ref 8–27)
Bilirubin Total: 0.5 mg/dL (ref 0.0–1.2)
CO2: 24 mmol/L (ref 20–29)
Calcium: 9.6 mg/dL (ref 8.7–10.3)
Chloride: 97 mmol/L (ref 96–106)
Creatinine, Ser: 0.85 mg/dL (ref 0.57–1.00)
Globulin, Total: 2.7 g/dL (ref 1.5–4.5)
Glucose: 127 mg/dL — ABNORMAL HIGH (ref 70–99)
Potassium: 3.7 mmol/L (ref 3.5–5.2)
Sodium: 139 mmol/L (ref 134–144)
Total Protein: 7.1 g/dL (ref 6.0–8.5)
eGFR: 76 mL/min/1.73 (ref >59)

## 2023-01-01 LAB — MAGNESIUM: Magnesium: 1.7 mg/dL (ref 1.6–2.3)

## 2023-01-01 LAB — VITAMIN D 25 HYDROXY (VIT D DEFICIENCY, FRACTURES): Vit D, 25-Hydroxy: 67.2 ng/mL (ref 30.0–100.0)

## 2023-01-15 ENCOUNTER — Ambulatory Visit
Admission: RE | Admit: 2023-01-15 | Discharge: 2023-01-15 | Disposition: A | Discharge status: Home / Self Care | Payer: Medicare PPO | Source: Ambulatory Visit | Attending: Surgery | Admitting: Surgery

## 2023-01-15 DIAGNOSIS — I728 Aneurysm of other specified arteries: Secondary | ICD-10-CM | POA: Diagnosis not present

## 2023-01-15 MED ORDER — IOPAMIDOL (ISOVUE-370) INJECTION 76%
100.0000 mL | Freq: Once | INTRAVENOUS | Status: AC | PRN
  Administered 2023-01-15: 100 mL via INTRAVENOUS

## 2023-01-19 ENCOUNTER — Encounter: Payer: Self-pay | Admitting: Surgery

## 2023-01-19 ENCOUNTER — Ambulatory Visit: Payer: Medicare PPO | Admitting: Surgery

## 2023-01-19 VITALS — BP 110/76 | HR 69 | Temp 98.3°F | Resp 20 | Ht 65.0 in | Wt 222.0 lb

## 2023-01-19 DIAGNOSIS — I728 Aneurysm of other specified arteries: Secondary | ICD-10-CM | POA: Diagnosis not present

## 2023-01-19 NOTE — Progress Notes (Signed)
[facility] [place]  Patient name: Barbara Jackson MRN: [id number] DOB: May 18, 1956 Sex: female   REASON FOR VISIT:    Follow up  HISOTRY OF PRESENT ILLNESS:    Barbara Jackson is a 67 y.o. 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  This appears to be thrombosed on her most recent CT scan.  She has chronic pelvic pain which was how her splenic aneurysm was detected.  She has not had any major medical issues since I last saw her.  She denies any history of postprandial abdominal pain.  She is a diabetic.  She is a non-smoker.  She takes a statin for hypercholesterolemia.  PAST MEDICAL HISTORY:   Past Medical History:  Diagnosis Date   Abnormal pap 1998   Negative Hpv   Anxiety    Arthritis    [name], chronic    Diabetes mellitus    Dysrhythmia    hx of atrial tachycardia and pacs   Esophageal reflux    H/O hiatal hernia    H/O iron deficiency anemia 08/2016   Hypercholesteremia    Hypertension    Obesity    compulsive overeater   Osteoarthritis      FAMILY HISTORY:   Family History  Problem Relation Age of Onset   Breast cancer Mother 30       Deceased, 40   Heart attack Father        Deceased, 76   COPD Father    Obesity Sister    Breast cancer Cousin 39       maternal 1st cousin   Hypertension Brother    Healthy Son     SOCIAL HISTORY:   Social History   Tobacco Use   Smoking status: Never   Smokeless tobacco: Never  Substance Use Topics   Alcohol use: Yes    Alcohol/week: 2.0 standard drinks of alcohol    Types: 2 Standard drinks or equivalent per week     ALLERGIES:   Allergies  Allergen Reactions   Diclofenac Other (See Comments)    GAS   Glucotrol [Glipizide] Nausea Only   Lisinopril Cough   Adhesive [Tape] Rash     CURRENT MEDICATIONS:   Current Outpatient Medications  Medication Sig Dispense Refill    albuterol (VENTOLIN HFA) 108 (90 Base) MCG/ACT inhaler Inhale 2 puffs into the lungs every 6 (six) hours as needed for wheezing or shortness of breath.      atorvastatin (LIPITOR) 20 MG tablet Take 20 mg by mouth daily with supper.   0   Blood Glucose Monitoring Suppl (ONE TOUCH ULTRA 2) w/Device KIT Use to check blood sugar 2-3 times a day 1 each 0   diazepam (VALIUM) 5 MG tablet Take 5-10 mg by mouth every 8 (eight) hours as needed for anxiety.     diphenhydrAMINE (BENADRYL) 25 mg capsule Take 25 mg by mouth every 6 (six) hours as needed for allergies.      fluticasone (FLONASE) 50 MCG/ACT nasal spray Place 2 sprays into both nostrils daily as needed for allergies.      FREESTYLE TEST STRIPS test strip   5   Iron-FA-B Cmp-C-Biot-Probiotic (FUSION PLUS) CAPS Take 1 capsule by mouth daily.      losartan-hydrochlorothiazide (HYZAAR) 100-25 MG per tablet Take 1 tablet by mouth every morning.     metFORMIN (GLUCOPHAGE-XR) 500 MG 24 hr tablet TAKE 4 TABLETS BY MOUTH DAILY WITH  SUPPER. 360 tablet 3   omeprazole (PRILOSEC) 40 MG capsule Take 40 mg by mouth daily before breakfast.     potassium chloride (KLOR-CON) 10 MEQ tablet Take 10 mEq by mouth daily.     sertraline (ZOLOFT) 50 MG tablet Take 50 mg by mouth daily with supper.     tirzepatide (MOUNJARO) 12.5 MG/0.5ML Pen INJECT 12.5 MG SUBCUTANEOUSLY ONE TIME PER WEEK 6 mL 3   Vitamin D, Ergocalciferol, (DRISDOL) 1.25 MG (50000 UNIT) CAPS capsule Take 1 capsule (50,000 Units total) by mouth every 7 (seven) days. 12 capsule 0   Vitamin D, Ergocalciferol, (DRISDOL) 1.25 MG (50000 UNIT) CAPS capsule Take 1 capsule (50,000 Units total) by mouth every 7 (seven) days. 12 capsule 0   zolpidem (AMBIEN) 10 MG tablet Take 10 mg by mouth at bedtime as needed for sleep.      No current facility-administered medications for this visit.    REVIEW OF SYSTEMS:   [X]  denotes positive finding, [ ]  denotes negative finding Cardiac  Comments:  Chest pain or chest  pressure:    Shortness of breath upon exertion:    Short of breath when lying flat:    Irregular heart rhythm:        Vascular    Pain in calf, thigh, or hip brought on by ambulation:    Pain in feet at night that wakes you up from your sleep:     Blood clot in your veins:    Leg swelling:         Pulmonary    Oxygen at home:    Productive cough:     Wheezing:         Neurologic    Sudden weakness in arms or legs:     Sudden numbness in arms or legs:     Sudden onset of difficulty speaking or slurred speech:    Temporary loss of vision in one eye:     Problems with dizziness:         Gastrointestinal    Blood in stool:     Vomited blood:         Genitourinary    Burning when urinating:     Blood in urine:        Psychiatric    Major depression:         Hematologic    Bleeding problems:    Problems with blood clotting too easily:        Skin    Rashes or ulcers:        Constitutional    Fever or chills:      PHYSICAL EXAM:   Vitals:   01/19/23 0915  BP: 110/76  Pulse: 69  Resp: 20  Temp: 98.3 F (36.8 C)  SpO2: 97%  Weight: 222 lb (100.7 kg)  Height: 5\' 5"  (1.651 m)    GENERAL: The patient is a well-nourished female, in no acute distress. The vital signs are documented above. CARDIAC: There is a regular rate and rhythm.  VASCULAR: No carotid bruits.  Palpable pedal pulses PULMONARY: Non-labored respirations ABDOMEN: Soft and non-tender MUSCULOSKELETAL: There are no major deformities or cyanosis. NEUROLOGIC: No focal weakness or paresthesias are detected. SKIN: There are no ulcers or rashes noted. PSYCHIATRIC: The patient has a normal affect.  STUDIES:   I have reviewed her CT scan with the following findings: VASCULAR   1. 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 No evidence of arterial enhancement within  the aneurysm. Stability over 2 years is highly reassuring. 2. No other clinically significant  vascular findings.   NON-VASCULAR   1. No significant abnormality within the abdomen or pelvis.    MEDICAL ISSUES:   Splenic artery aneurysm: Maximum diameter remains 1.8 cm.  This is a saccular aneurysm which appears to be thrombosed.  I told her that I doubt this will need to be intervened on in the future, but we should continue to follow it.  I will plan on getting a ultrasound and 2 years.  If this does not fully evaluate the aneurysm, we may need to get another CT scan.    Charlena Cross, MD, FACS [facility] [phone] Pager [phone]

## 2023-01-20 DIAGNOSIS — R42 Dizziness and giddiness: Secondary | ICD-10-CM | POA: Diagnosis not present

## 2023-01-20 DIAGNOSIS — H938X3 Other specified disorders of ear, bilateral: Secondary | ICD-10-CM | POA: Diagnosis not present

## 2023-01-20 DIAGNOSIS — G518 Other disorders of facial nerve: Secondary | ICD-10-CM | POA: Diagnosis not present

## 2023-01-27 DIAGNOSIS — F411 Generalized anxiety disorder: Secondary | ICD-10-CM | POA: Diagnosis not present

## 2023-01-30 ENCOUNTER — Telehealth: Payer: Self-pay

## 2023-01-30 ENCOUNTER — Ambulatory Visit: Payer: Medicare PPO | Admitting: Radiology

## 2023-01-30 VITALS — BP 100/68 | Temp 98.0°F

## 2023-01-30 DIAGNOSIS — N6311 Unspecified lump in the right breast, upper outer quadrant: Secondary | ICD-10-CM | POA: Diagnosis not present

## 2023-01-30 NOTE — Progress Notes (Signed)
   Barbara Jackson 10-03-1955 [id number]   History:  67 y.o. G1P1 presents with complaints of breast mass, right side, noticed last night.  Gynecologic History Patient's last menstrual period was 06/16/2005.   Contraception/Family planning: post menopausal status Last mammogram: 6/23. Results were: normal  Obstetric History OB History  Gravida Para Term Preterm AB Living  1 1 1     1   SAB IAB Ectopic Multiple Live Births          1    # Outcome Date GA Lbr Len/2nd Weight Sex Type Anes PTL Lv  1 Term 1989 [redacted]w[redacted]d 08:00 7 lb 12 oz (3.515 kg) M Vag-Spont   LIV     The following portions of the patient's history were reviewed and updated as appropriate: allergies, current medications, past family history, past medical history, past social history, past surgical history, and problem list.  Review of Systems Pertinent items noted in HPI and remainder of comprehensive ROS otherwise negative.   Past medical history, past surgical history, family history and social history were all reviewed and documented in the EPIC chart.   Exam:  Vitals:   01/30/23 1326  BP: 100/68  Temp: 98 F (36.7 C)  TempSrc: Oral   There is no height or weight on file to calculate BMI.  General appearance:  Normal Thyroid:  Symmetrical, normal in size, without palpable masses or nodularity. Respiratory  Auscultation:  Clear without wheezing or rhonchi Cardiovascular  Auscultation:  Regular rate, without rubs, murmurs or gallops  Edema/varicosities:  Not grossly evident Breasts: left breast normal without mass, skin or nipple changes or axillary nodes, abnormal mass palpable right breast 10 oclock 4xmx2cm fixed, non tender.   Patient informed chaperone available to be present for breast exam. Patient has requested no chaperone to be present.   Assessment/Plan:   1. Mass of upper outer quadrant of right breast Dx mammo and u/s     Pietro Bonura B WHNP-BC 2:04 PM 01/30/2023

## 2023-01-30 NOTE — Telephone Encounter (Signed)
-----   Message from [place], [place] B sent at 01/30/2023  2:03 PM EDT ----- Regarding: Dx mammo Please schedule for diagnostic mammo and u/s right breast mass 10 oclock 4cm x 2cm nontender, fixed. Has been to [facility] and the breast center before. Would like a call by end of the day.

## 2023-01-30 NOTE — Telephone Encounter (Signed)
Order filled out, authorized by provider, and faxed to [facility] successfully.   Pt notified and voiced understanding that order was faxed and confirmation was received. Advised her to wait ~1hr to give them a call to schedule appt. She voiced understanding/appreciation for call.   Routing to provider for final review and closing.

## 2023-02-03 ENCOUNTER — Telehealth: Payer: Self-pay | Admitting: Obstetrics and Gynecology

## 2023-02-03 DIAGNOSIS — R590 Localized enlarged lymph nodes: Secondary | ICD-10-CM | POA: Diagnosis not present

## 2023-02-03 DIAGNOSIS — N6311 Unspecified lump in the right breast, upper outer quadrant: Secondary | ICD-10-CM | POA: Diagnosis not present

## 2023-02-03 DIAGNOSIS — N6312 Unspecified lump in the right breast, upper inner quadrant: Secondary | ICD-10-CM

## 2023-02-03 NOTE — Telephone Encounter (Signed)
Patient is scheduled for a contrast enhanced mammogram at [facility] on 02/09/23.   She needs a BMP with GFR prior to having this study done.   I will place a future order for this lab.   It will need to be done about 2 days prior to her appointment at [facility].

## 2023-02-04 ENCOUNTER — Ambulatory Visit: Payer: Medicare PPO | Admitting: Gastroenterology

## 2023-02-04 ENCOUNTER — Encounter: Payer: Self-pay | Admitting: Gastroenterology

## 2023-02-04 VITALS — BP 126/80 | HR 91 | Ht 65.0 in | Wt 222.0 lb

## 2023-02-04 DIAGNOSIS — Z8639 Personal history of other endocrine, nutritional and metabolic disease: Secondary | ICD-10-CM

## 2023-02-04 DIAGNOSIS — R194 Change in bowel habit: Secondary | ICD-10-CM

## 2023-02-04 DIAGNOSIS — Z1211 Encounter for screening for malignant neoplasm of colon: Secondary | ICD-10-CM

## 2023-02-04 DIAGNOSIS — K219 Gastro-esophageal reflux disease without esophagitis: Secondary | ICD-10-CM

## 2023-02-04 DIAGNOSIS — K449 Diaphragmatic hernia without obstruction or gangrene: Secondary | ICD-10-CM | POA: Diagnosis not present

## 2023-02-04 DIAGNOSIS — Z79899 Other long term (current) drug therapy: Secondary | ICD-10-CM | POA: Diagnosis not present

## 2023-02-04 MED ORDER — CLENPIQ 10-3.5-12 MG-GM -GM/175ML PO SOLN: 1.0000 | Freq: Once | ORAL | 0 refills | Status: AC

## 2023-02-04 NOTE — Patient Instructions (Addendum)
You have been scheduled for an endoscopy and colonoscopy. Please follow the written instructions given to you at your visit today. Please pick up your prep supplies at the pharmacy within the next 1-3 days. If you use inhalers (even only as needed), please bring them with you on the day of your procedure.  DO NOT TAKE 7 DAYS PRIOR TO TEST- Trulicity (dulaglutide) Ozempic, Wegovy (semaglutide) Mounjaro (tirzepatide) Bydureon Bcise (exanatide extended release)  DO NOT TAKE 1 DAY PRIOR TO YOUR TEST Rybelsus (semaglutide) Adlyxin (lixisenatide) Victoza (liraglutide) Byetta (exanatide) _____________________________________________________________________  Hold Imodium 5 days before procedure and take Miralax once daily for those 5 days to ensure a good prep.  Continue omeprazole and Imodium.  We will request labs from Dr. Vincente Liberty office.  Thank you for entrusting me with your care and for choosing [facility], Dr. Ileene Patrick    If your blood pressure at your visit was 140/90 or greater, please contact your primary care physician to follow up on this. ______________________________________________________  If you are age 67 or older, your body mass index should be between 23-30. Your Body mass index is 36.94 kg/m. If this is out of the aforementioned range listed, please consider follow up with your Primary Care Provider.  If you are age 67 or younger, your body mass index should be between 19-25. Your Body mass index is 36.94 kg/m. If this is out of the aformentioned range listed, please consider follow up with your Primary Care Provider.  ________________________________________________________  The [facility] providers would like to encourage you to use [facility] to communicate with providers for non-urgent requests or questions.  Due to long hold times on the telephone, sending your provider a message by [facility] may be a faster and more efficient way to get a  response.  Please allow 48 business hours for a response.  Please remember that this is for non-urgent requests.  _______________________________________________________  Due to recent changes in healthcare laws, you may see the results of your imaging and laboratory studies on MyChart before your provider has had a chance to review them.  We understand that in some cases there may be results that are confusing or concerning to you. Not all laboratory results come back in the same time frame and the provider may be waiting for multiple results in order to interpret others.  Please give [place] 48 hours in order for your provider to thoroughly review all the results before contacting the office for clarification of your results.

## 2023-02-04 NOTE — Telephone Encounter (Signed)
Ok to schedule her here for 08/23?

## 2023-02-04 NOTE — Telephone Encounter (Signed)
OK for labs to be drawn here.  Please make the lab appointment.

## 2023-02-04 NOTE — Progress Notes (Addendum)
HPI :  67 year old female with a history of reported iron deficiency, GERD, hiatal hernia, here to establish her GI care for some of these issues, this is the first time seeing her.  Referred by Dr. Farris Has.  She has been followed in the past by Dr. Loreta Ave for her GI care, last seen in 2022.  The patient reports a history of iron deficiency anemia related to [facility] lesions from a large hiatal hernia.  She had an endoscopy in 2018 showing a "large hiatal hernia" with mild esophagitis and some linear gastric ulcers.  I do not see any pathology results from that exam.  She has been referred to see Dr. Andrey Campanile in the past for repair of her hernia, that was years ago and she was told she needed to lose some weight.  She has decreased her weight from 241 pounds to 222 pounds.  She is currently on Mounjaro.  She has been on omeprazole in the past for her symptoms.  She states previously she had a lot of symptoms of reflux and dysphagia that bothered her.  More recently she has not had the symptoms bother her too much at all.  She is in fact only taking omeprazole a few times per month for occasional symptoms.  She denies any dysphagia, no nausea or vomiting.  No abdominal pains.  She does take occasional ibuprofen for muscle soreness.  She also states she takes ibuprofen PM to help her sleep.  We discussed NSAID use.  She follows with her primary care Dr. Kateri Plummer for labs, she states she had iron studies and a CBC in March.  I am not sure what her hemoglobin is but she states her ferritin was around 15.  She takes an iron supplement daily.  Her last colonoscopy was performed in 2018.  There was no abnormalities noted but her prep was inadequate especially in the right colon, she was told to follow-up in a few years for repeat exam, she has not had that done yet.  She states she has had some altered bowel habits over years.  She has diarrhea at least once per week.  Her baseline stool frequency is about once  or twice per day, formed can vary.  She takes Imodium a few times per week.  She has postprandial urgency with coffee or particular foods like chocolate.  Her gallbladder is in place.  Denies any blood in the stools, no family history of colon cancer.  Her symptoms are stable over time.  She denies any cardiopulmonary symptoms.  Unfortunately she was told she recently had a breast mass noted on imaging that is concerning for cancer, she has a breast biopsy on Monday.  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  Prior workup reviewed as outlined: Colonoscopy - 11/12/2016 - normal but inadequate prep in the right colon - told to repeat in a few years  EGD 11/12/2016 - "grade I" esophagitis, large hiatal hernia, 4 linear gastric antral ulcers, normal duodenum  CTA 01/15/2023:   IMPRESSION: VASCULAR   1. 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 No evidence of arterial enhancement within the aneurysm. Stability over 2 years is highly reassuring. 2. No other clinically significant vascular findings.   NON-VASCULAR   1. No significant abnormality within the abdomen or pelvis.  Barium swallow 07/2006: IMPRESSION:  Small sliding hiatal hernia with  a distal esophageal mucosal ring seen above the hiatal hernia (not obstructive to barium tablet). Otherwise normal barium esophagram.        Past Medical History:  Diagnosis Date   Abnormal pap 1998   Negative Hpv   Anxiety    Arthritis    [name], chronic    Diabetes mellitus    Dysrhythmia    hx of atrial tachycardia and pacs   Esophageal reflux    GERD (gastroesophageal reflux disease)    H/O hiatal hernia    H/O iron deficiency anemia 08/2016   Hypercholesteremia    Hypertension    IDA (iron deficiency anemia)    Obesity    compulsive overeater   Osteoarthritis    Sleep apnea      Past Surgical History:   Procedure Laterality Date   APPENDECTOMY  1975   TOTAL KNEE ARTHROPLASTY  01/12/2012   Procedure: TOTAL KNEE ARTHROPLASTY;  Surgeon: Loanne Drilling, MD;  Location: [facility];  Service: Orthopedics;  Laterality: Left;   TOTAL KNEE ARTHROPLASTY Right 01/10/2019   Procedure: TOTAL KNEE ARTHROPLASTY;  Surgeon: Ollen Gross, MD;  Location: [facility];  Service: Orthopedics;  Laterality: Right;    Family History  Problem Relation Age of Onset   Breast cancer Mother 6       Deceased, 40   Heart attack Father        Deceased, 64   COPD Father    Obesity Sister    Hypertension Brother    Healthy Son    Breast cancer Cousin 68       maternal 1st cousin   Colon cancer Neg Hx    Stomach cancer Neg Hx    Esophageal cancer Neg Hx    Social History   Tobacco Use   Smoking status: Never   Smokeless tobacco: Never  Vaping Use   Vaping status: Never Used  Substance Use Topics   Alcohol use: Yes    Alcohol/week: 2.0 standard drinks of alcohol    Types: 2 Standard drinks or equivalent per week    Comment: occassionally   Drug use: No   Current Outpatient Medications  Medication Sig Dispense Refill   atorvastatin (LIPITOR) 20 MG tablet Take 20 mg by mouth daily with supper.   0   Blood Glucose Monitoring Suppl (ONE TOUCH ULTRA 2) w/Device KIT Use to check blood sugar 2-3 times a day 1 each 0   metFORMIN (GLUCOPHAGE-XR) 500 MG 24 hr tablet TAKE 4 TABLETS BY MOUTH DAILY WITH SUPPER. 360 tablet 3   omeprazole (PRILOSEC) 40 MG capsule Take 40 mg by mouth daily before breakfast.     potassium chloride (KLOR-CON) 10 MEQ tablet Take 10 mEq by mouth daily.     sertraline (ZOLOFT) 50 MG tablet Take 50 mg by mouth daily with supper.     tirzepatide (MOUNJARO) 12.5 MG/0.5ML Pen INJECT 12.5 MG SUBCUTANEOUSLY ONE TIME PER WEEK 6 mL 3   Vitamin D, Ergocalciferol, (DRISDOL) 1.25 MG (50000 UNIT) CAPS capsule Take 1 capsule (50,000 Units total) by mouth every 7 (seven) days. 12 capsule 0   zolpidem  (AMBIEN) 10 MG tablet Take 10 mg by mouth at bedtime as needed for sleep.      albuterol (VENTOLIN HFA) 108 (90 Base) MCG/ACT inhaler Inhale 2 puffs into the lungs every 6 (six) hours as needed for wheezing or shortness of breath.  (Patient not taking: Reported on 02/04/2023)     diazepam (VALIUM) 5 MG tablet Take 5-10  mg by mouth every 8 (eight) hours as needed for anxiety. (Patient not taking: Reported on 02/04/2023)     diphenhydrAMINE (BENADRYL) 25 mg capsule Take 25 mg by mouth every 6 (six) hours as needed for allergies.      fluticasone (FLONASE) 50 MCG/ACT nasal spray Place 2 sprays into both nostrils daily as needed for allergies.  (Patient not taking: Reported on 02/04/2023)     FREESTYLE TEST STRIPS test strip   5   Iron-FA-B Cmp-C-Biot-Probiotic (FUSION PLUS) CAPS Take 1 capsule by mouth daily.  (Patient not taking: Reported on 02/04/2023)     losartan-hydrochlorothiazide (HYZAAR) 100-25 MG per tablet Take 1 tablet by mouth every morning.     No current facility-administered medications for this visit.   Allergies  Allergen Reactions   Diclofenac Other (See Comments)    GAS   Glucotrol [Glipizide] Nausea Only   Lisinopril Cough   Adhesive [Tape] Rash     Review of Systems: All systems reviewed and negative except where noted in HPI.    CT ANGIO ABDOMEN PELVIS  W & WO CONTRAST  Result Date: 01/15/2023 CLINICAL DATA:  Splenic artery aneurysm follow-up EXAM: CTA ABDOMEN AND PELVIS WITHOUT AND WITH CONTRAST TECHNIQUE: Multidetector CT imaging of the abdomen and pelvis was performed using the standard protocol during bolus administration of intravenous contrast. Multiplanar reconstructed images and MIPs were obtained and reviewed to evaluate the vascular anatomy. RADIATION DOSE REDUCTION: This exam was performed according to the departmental dose-optimization program which includes automated exposure control, adjustment of the mA and/or kV according to patient size and/or use of  iterative reconstruction technique. CONTRAST:  ISOVUE-370 IOPAMIDOL (ISOVUE-370) INJECTION 76% COMPARISON:  Prior CT scan of the abdomen and pelvis 10/04/2020 FINDINGS: VASCULAR Aorta: Normal caliber aorta without aneurysm, dissection, vasculitis or significant stenosis. Celiac: Patent without evidence of aneurysm, dissection, vasculitis or significant stenosis. Thrombosed splenic artery aneurysm arising from the distal splenic artery is stable at 1.8 x 1.5 cm. SMA: Patent without evidence of aneurysm, dissection, vasculitis or significant stenosis. Renals: Both renal arteries are patent without evidence of aneurysm, dissection, vasculitis, fibromuscular dysplasia or significant stenosis. Small accessory artery to the left kidney. IMA: Patent without evidence of aneurysm, dissection, vasculitis or significant stenosis. Inflow: Patent without evidence of aneurysm, dissection, vasculitis or significant stenosis. Proximal Outflow: Bilateral common femoral and visualized portions of the superficial and profunda femoral arteries are patent without evidence of aneurysm, dissection, vasculitis or significant stenosis. Veins: No focal venous abnormality. Review of the MIP images confirms the above findings. NON-VASCULAR Lower chest: No acute abnormality. Hepatobiliary: No focal liver abnormality is seen. No gallstones, gallbladder wall thickening, or biliary dilatation. Pancreas: Unremarkable. No pancreatic ductal dilatation or surrounding inflammatory changes. Spleen: Normal in size without focal abnormality. Adrenals/Urinary Tract: Adrenal glands are unremarkable. Kidneys are normal, without renal calculi, focal lesion, or hydronephrosis. Bladder is unremarkable. Stomach/Bowel: Stomach is within normal limits. No evidence of bowel wall thickening, distention, or inflammatory changes. Lymphatic: No suspicious lymphadenopathy. Reproductive: Uterus and bilateral adnexa are unremarkable. Other: No abdominal wall hernia  or abnormality. No abdominopelvic ascites. Musculoskeletal: No acute or significant osseous findings. IMPRESSION: VASCULAR 1. 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 No evidence of arterial enhancement within the aneurysm. Stability over 2 years is highly reassuring. 2. No other clinically significant vascular findings. NON-VASCULAR 1. No significant abnormality within the abdomen or pelvis. Signed, Sterling Big, MD, RPVI [facility] Electronically Signed  By: Malachy Moan M.D.   On: 01/15/2023 16:39    Physical Exam: BP 126/80   Pulse 91   Ht 5\' 5"  (1.651 m)   Wt 222 lb (100.7 kg)   LMP 06/16/2005   BMI 36.94 kg/m  Constitutional: Pleasant,well-developed, female in no acute distress. HEENT: Normocephalic and atraumatic. Conjunctivae are normal. No scleral icterus. Neck supple.  Cardiovascular: Normal rate, regular rhythm.  Pulmonary/chest: Effort normal and breath sounds normal.  Abdominal: Soft, nondistended, nontender. There are no masses palpable. No hepatomegaly. Extremities: no edema Lymphadenopathy: No cervical adenopathy noted. Neurological: Alert and oriented to person place and time. Skin: Skin is warm and dry. No rashes noted. Psychiatric: Normal mood and affect. Behavior is normal.   ASSESSMENT: 67 y.o. female here for assessment of the following  1. Gastroesophageal reflux disease, unspecified whether esophagitis present   2. Hiatal hernia   3. Long-term current use of proton pump inhibitor therapy   4. History of iron deficiency   5. Altered bowel habits   6. Colon cancer screening    Longstanding GERD in the setting of large reported hiatal hernia with Sheria Lang lesions suspected causing iron deficiency over time.  She is not a candidate for hiatal hernia repair in the past due to her weight.  She has since lost weight, BMI currently around 37.  Since she has  lost weight her reflux symptoms are not nearly as bothersome, she is using PPI only sparingly.  Ferritin still low normal despite taking oral iron.  We discussed options, I think an EGD is reasonable to reassess her esophagus, rule out Barrett's esophagus, ensure healing of ulcers, and see if any active Sheria Lang lesions related to her hiatal hernia that would warrant more frequent use of PPI.  We did discuss long-term risks of chronic PPI use, want to use the lowest daily dose needed to control her symptoms and control her anemia.  Following discussion of risks and benefits she wants to proceed with the upper endoscopy to reassess these issues.  She is currently on Mounjaro, hoping to lose weight to the point where she may be a candidate for hiatal hernia repair at some point.  Ideally would like BMI less than 35 for consideration of surgery.  She will continue iron tablets for now, I will request last labs from her primary care to review her last blood counts and iron studies. In light of her iron deficiency and history of Cameron lesions, I recommend she minimize her NSAID use, she understands.  Otherwise, her last colonoscopy was incomplete /limited due to prep.  Given her history of iron deficiency I recommend a colonoscopy with high quality prep.  We discussed this.  She is also having some intermittent loose stools stable over the years, controlled with Imodium.  We will do this at the same time as her upper endoscopy.  She will hold Imodium for a few days prior to the prep.  Will give her standard bowel prep with additional MiraLAX prep to use as needed in case she does not feel adequately prepped.  She agrees with the plan as outlined.  Of note she is having a breast biopsy for suspected malignancy next week.  If she requires breast cancer therapy in the interim she may need to change these appointments which is understandable, and asked her to contact us if she needs to change her appointment date.   She agrees  PLAN: - book for EGD and colonoscopy in the [facility] - reassess for cameron  lesions, rule out BE, last colonoscopy was limited - continue omeprazole PRN - discussed long term risks / benefits - limit NSAID use - continue iron supplementation - labs from Dr. Vincente Liberty office - last CBC and iron studies - continue immodium - may need to change date of her procedures due to recent breast mass pending her course, suspected malignancy and may need surgery / treatment  Harlin Rain, MD [facility]  CC: Farris Has, MD   Addendum: Labs arrived after the patient's visit: 09/03/22: Iron 88, iron sat 21%, TIBC 418,, ferritin 15.6 Hgb 13.5, MCV 84.6, plt 429, WBC 7.3

## 2023-02-04 NOTE — Telephone Encounter (Signed)
Pt scheduled for lab appt on 02/06/2023 @ 830 AM and since it was not specified, I advised her that she did not have to fast for this.  Routing encounter to provider for final review and closing.

## 2023-02-04 NOTE — Telephone Encounter (Signed)
Pt LVM in triage line about scheduling lab appt.

## 2023-02-05 ENCOUNTER — Ambulatory Visit (INDEPENDENT_AMBULATORY_CARE_PROVIDER_SITE_OTHER): Payer: Medicare PPO | Admitting: Family Medicine

## 2023-02-05 ENCOUNTER — Encounter (INDEPENDENT_AMBULATORY_CARE_PROVIDER_SITE_OTHER): Payer: Self-pay | Admitting: Family Medicine

## 2023-02-05 VITALS — BP 100/69 | HR 73 | Temp 97.7°F | Ht 65.0 in | Wt 216.0 lb

## 2023-02-05 DIAGNOSIS — E559 Vitamin D deficiency, unspecified: Secondary | ICD-10-CM | POA: Diagnosis not present

## 2023-02-05 DIAGNOSIS — Z7985 Long-term (current) use of injectable non-insulin antidiabetic drugs: Secondary | ICD-10-CM

## 2023-02-05 DIAGNOSIS — Z6836 Body mass index (BMI) 36.0-36.9, adult: Secondary | ICD-10-CM | POA: Diagnosis not present

## 2023-02-05 DIAGNOSIS — N6310 Unspecified lump in the right breast, unspecified quadrant: Secondary | ICD-10-CM | POA: Diagnosis not present

## 2023-02-05 DIAGNOSIS — E669 Obesity, unspecified: Secondary | ICD-10-CM

## 2023-02-05 DIAGNOSIS — E1169 Type 2 diabetes mellitus with other specified complication: Secondary | ICD-10-CM | POA: Diagnosis not present

## 2023-02-05 DIAGNOSIS — N63 Unspecified lump in unspecified breast: Secondary | ICD-10-CM

## 2023-02-05 MED ORDER — VITAMIN D (ERGOCALCIFEROL) 1.25 MG (50000 UNIT) PO CAPS
50000.0000 [IU] | ORAL_CAPSULE | ORAL | 0 refills | Status: DC
Start: 2023-02-05 — End: 2023-04-08

## 2023-02-05 NOTE — Progress Notes (Signed)
.smr  Office: [phone]  /  Fax: [phone]  WEIGHT SUMMARY AND BIOMETRICS  Anthropometric Measurements Height: 5\' 5"  (1.651 m) Weight: 216 lb (98 kg) BMI (Calculated): 35.94 Weight at Last Visit: 218 lb Weight Lost Since Last Visit: 2 lb Weight Gained Since Last Visit: 0 Starting Weight: 242 lb Total Weight Loss (lbs): 26 lb (11.8 kg)   Body Composition  Body Fat %: 46.9 % Fat Mass (lbs): 101.4 lbs Muscle Mass (lbs): 109 lbs Total Body Water (lbs): 79.2 lbs Visceral Fat Rating : 14   Other Clinical Data Fasting: No Labs: No Today's Visit #: 14 Starting Date: 12/04/21    Chief Complaint: OBESITY  History of Present Illness   The patient is a 67 year old individual with a history of obesity, type 2 diabetes, and vitamin D deficiency. She has lost two pounds in the past month and has been adhering to a category three dietary plan about 15% of the time. She has been engaging in physical activities such as biking, walking, and swimming for 20-30 minutes four times a week. She is currently on Mounjaro for diabetes management and has requested a refill of her vitamin D prescription.  The patient reports a recent discovery of a lump in her breast, which was evaluated at a radiology clinic. Preliminary findings from the radiologist suggest the possibility of cancer, with three spots and two lymph nodes involved. She has not widely shared this information and is currently processing the potential diagnosis.  In terms of her diabetes management, the patient reports good tolerance of Mounjaro, with no gastrointestinal upset or constipation. She has been seen by a new gastroenterologist who also inquired about her experience with this medication.  Regarding her weight management, the patient attributes her recent weight loss to increased protein intake. She reports good control of her hunger but struggles with cravings for sweets, particularly chocolate. She has been trying to  incorporate more brightly colored fruits and vegetables into her diet for their antioxidant properties.  The patient has also been experiencing issues with her sleep, which she attributes to the stress of her potential cancer diagnosis. She has been trying to avoid excessive internet research about her condition to prevent further anxiety. She is considering increasing her intake of antioxidant-rich foods and continuing her weight loss efforts to reduce inflammation and potentially decrease the risk of cancer recurrence.          PHYSICAL EXAM:  Blood pressure 100/69, pulse 73, temperature 97.7 F (36.5 C), height 5\' 5"  (1.651 m), weight 216 lb (98 kg), last menstrual period 06/16/2005, SpO2 99%. Body mass index is 35.94 kg/m.  DIAGNOSTIC DATA REVIEWED:  BMET    Component Value Date/Time   NA 139 12/31/2022 1324   K 3.7 12/31/2022 1324   CL 97 12/31/2022 1324   CO2 24 12/31/2022 1324   GLUCOSE 127 (H) 12/31/2022 1324   GLUCOSE 134 (H) 08/13/2021 1041   BUN 19 12/31/2022 1324   CREATININE 0.85 12/31/2022 1324   CALCIUM 9.6 12/31/2022 1324   GFRNONAA >60 01/12/2019 0711   GFRAA >60 01/12/2019 0711   Lab Results  Component Value Date   HGBA1C 6.4 (A) 09/01/2022   HGBA1C 6.1 (A) 05/03/2018   Lab Results  Component Value Date   INSULIN 11.8 12/04/2021   Lab Results  Component Value Date   TSH 2.320 12/04/2021   CBC    Component Value Date/Time   WBC 12.5 (H) 01/12/2019 0711   RBC 4.31 01/12/2019 0711   HGB  12.2 01/12/2019 0711   HGB 12.9 12/24/2017 1556   HCT 37.3 01/12/2019 0711   HCT 38.6 12/24/2017 1556   PLT 417 (H) 01/12/2019 0711   PLT 504 (H) 12/24/2017 1556   MCV 86.5 01/12/2019 0711   MCV 82 12/24/2017 1556   MCH 28.3 01/12/2019 0711   MCHC 32.7 01/12/2019 0711   RDW 13.9 01/12/2019 0711   RDW 14.7 12/24/2017 1556   Iron Studies    Component Value Date/Time   FERRITIN 22 12/04/2017 1618   Lipid Panel     Component Value Date/Time   CHOL 174  08/13/2021 1041   TRIG 217.0 (H) 08/13/2021 1041   HDL 84.60 08/13/2021 1041   CHOLHDL 2 08/13/2021 1041   VLDL 43.4 (H) 08/13/2021 1041   LDLDIRECT 33.0 08/13/2021 1041   Hepatic Function Panel     Component Value Date/Time   PROT 7.1 12/31/2022 1324   ALBUMIN 4.4 12/31/2022 1324   AST 19 12/31/2022 1324   ALT 20 12/31/2022 1324   ALKPHOS 64 12/31/2022 1324   BILITOT 0.5 12/31/2022 1324      Component Value Date/Time   TSH 2.320 12/04/2021 0904   Nutritional Lab Results  Component Value Date   VD25OH 67.2 12/31/2022   VD25OH 27.6 (L) 12/04/2021     Assessment and Plan    Breast Mass Newly discovered right breast lump with possible lymph node involvement. Patient is scheduled for biopsy. Discussed the importance of nutrition and weight management in cancer treatment and prevention. -Continue current exercise and dietary regimen. -Consider increasing intake of antioxidant-rich foods, particularly brightly colored fruits and vegetables. -Continue regular appointments or adjust as needed based on upcoming cancer treatment schedule.  Obesity Continued weight loss progress, down two pounds since last visit. Patient is following dietary plan and exercising regularly. -Continue current exercise and dietary regimen. -Consider using chat GPT for recipe ideas to maintain variety and interest in diet.  Type 2 Diabetes Well-managed on Mounjaro 12.5mg . -Continue Mounjaro 12.5mg .  Vitamin D Deficiency Patient requests refill of vitamin D supplement. -Refill vitamin D supplement prescription.  General Health Maintenance -Continue regular appointments or adjust as needed based on upcoming cancer treatment schedule. -Check in via MyChart as needed between appointments.         I have personally spent 45 minutes total time today in preparation, patient care, and documentation for this visit, including the following: review of clinical lab tests; review of medical  tests/procedures/services.    She was informed of the importance of frequent follow up visits to maximize her success with intensive lifestyle modifications for her multiple health conditions.    Quillian Quince, MD

## 2023-02-05 NOTE — Telephone Encounter (Signed)
FYI, order for contrast enhanced mammo/US guided bx signed by BS on 02/03/2023 and faxed successfully back to [facility] on 02/04/2023.

## 2023-02-06 ENCOUNTER — Other Ambulatory Visit: Payer: Medicare PPO

## 2023-02-06 ENCOUNTER — Encounter: Payer: Self-pay | Admitting: Obstetrics and Gynecology

## 2023-02-06 DIAGNOSIS — N6312 Unspecified lump in the right breast, upper inner quadrant: Secondary | ICD-10-CM

## 2023-02-07 LAB — BASIC METABOLIC PANEL WITH GFR
BUN: 17 mg/dL (ref 7–25)
CO2: 23 mmol/L (ref 20–32)
Calcium: 9.2 mg/dL (ref 8.6–10.4)
Chloride: 102 mmol/L (ref 98–110)
Creat: 0.77 mg/dL (ref 0.50–1.05)
Glucose, Bld: 140 mg/dL — ABNORMAL HIGH (ref 65–99)
Potassium: 3.8 mmol/L (ref 3.5–5.3)
Sodium: 137 mmol/L (ref 135–146)
eGFR: 85 mL/min/{1.73_m2} (ref >=60)

## 2023-02-09 ENCOUNTER — Other Ambulatory Visit: Payer: Self-pay | Admitting: Radiology

## 2023-02-09 DIAGNOSIS — R928 Other abnormal and inconclusive findings on diagnostic imaging of breast: Secondary | ICD-10-CM | POA: Diagnosis not present

## 2023-02-09 DIAGNOSIS — C50919 Malignant neoplasm of unspecified site of unspecified female breast: Secondary | ICD-10-CM

## 2023-02-09 DIAGNOSIS — N63 Unspecified lump in unspecified breast: Secondary | ICD-10-CM | POA: Diagnosis not present

## 2023-02-09 DIAGNOSIS — C50411 Malignant neoplasm of upper-outer quadrant of right female breast: Secondary | ICD-10-CM | POA: Diagnosis not present

## 2023-02-09 DIAGNOSIS — D0511 Intraductal carcinoma in situ of right breast: Secondary | ICD-10-CM | POA: Diagnosis not present

## 2023-02-09 HISTORY — DX: Malignant neoplasm of unspecified site of unspecified female breast: C50.919

## 2023-02-11 ENCOUNTER — Encounter: Payer: Self-pay | Admitting: Obstetrics and Gynecology

## 2023-02-11 ENCOUNTER — Telehealth: Payer: Self-pay | Admitting: Hematology and Oncology

## 2023-02-11 NOTE — Telephone Encounter (Signed)
Spoke to patient to confirm upcoming morning [facility] clinic appointment  on 9/4, paperwork will be sent via [employer].  Gave location and time, also informed patient that the surgeon's office would be calling as well to get information from them similar to the packet that they will be receiving so make sure to do both.  Reminded patient that all providers will be coming to the clinic to see them HERE and if they had any questions to not hesitate to reach back out to myself or their navigators.

## 2023-02-12 ENCOUNTER — Encounter: Payer: Self-pay | Admitting: General Practice

## 2023-02-12 DIAGNOSIS — D0511 Intraductal carcinoma in situ of right breast: Secondary | ICD-10-CM | POA: Diagnosis not present

## 2023-02-12 DIAGNOSIS — N63 Unspecified lump in unspecified breast: Secondary | ICD-10-CM | POA: Diagnosis not present

## 2023-02-12 DIAGNOSIS — R928 Other abnormal and inconclusive findings on diagnostic imaging of breast: Secondary | ICD-10-CM | POA: Diagnosis not present

## 2023-02-12 NOTE — Progress Notes (Signed)
[facility] Spiritual Care Note  Barbara Jackson phoned to cancel her appointment today to enable other self-care activities and plans to phone again to reschedule.   [address] [place], [place], [facility] Pager [phone] Voicemail [phone]

## 2023-02-17 ENCOUNTER — Encounter: Payer: Self-pay | Admitting: *Deleted

## 2023-02-17 DIAGNOSIS — Z170 Estrogen receptor positive status [ER+]: Secondary | ICD-10-CM

## 2023-02-18 ENCOUNTER — Other Ambulatory Visit: Payer: Self-pay

## 2023-02-18 ENCOUNTER — Encounter: Payer: Self-pay | Admitting: Physical Therapy

## 2023-02-18 ENCOUNTER — Encounter: Payer: Self-pay | Admitting: Hematology and Oncology

## 2023-02-18 ENCOUNTER — Inpatient Hospital Stay: Payer: Medicare PPO | Attending: Hematology and Oncology

## 2023-02-18 ENCOUNTER — Encounter: Payer: Self-pay | Admitting: *Deleted

## 2023-02-18 ENCOUNTER — Inpatient Hospital Stay (HOSPITAL_BASED_OUTPATIENT_CLINIC_OR_DEPARTMENT_OTHER): Payer: Medicare PPO | Admitting: Hematology and Oncology

## 2023-02-18 ENCOUNTER — Inpatient Hospital Stay (HOSPITAL_BASED_OUTPATIENT_CLINIC_OR_DEPARTMENT_OTHER): Payer: Medicare PPO | Admitting: Genetic Counselor

## 2023-02-18 ENCOUNTER — Ambulatory Visit
Admission: RE | Admit: 2023-02-18 | Discharge: 2023-02-18 | Disposition: A | Discharge status: Home / Self Care | Payer: Medicare PPO | Source: Ambulatory Visit | Attending: Radiation Oncology | Admitting: Radiation Oncology

## 2023-02-18 ENCOUNTER — Encounter: Payer: Self-pay | Admitting: Genetic Counselor

## 2023-02-18 ENCOUNTER — Ambulatory Visit: Payer: Self-pay | Admitting: Surgery

## 2023-02-18 ENCOUNTER — Ambulatory Visit: Payer: Medicare PPO | Attending: Surgery | Admitting: Physical Therapy

## 2023-02-18 VITALS — BP 141/75 | HR 93 | Temp 97.2°F | Resp 18 | Ht 65.0 in | Wt 221.0 lb

## 2023-02-18 DIAGNOSIS — C50411 Malignant neoplasm of upper-outer quadrant of right female breast: Secondary | ICD-10-CM

## 2023-02-18 DIAGNOSIS — Z170 Estrogen receptor positive status [ER+]: Secondary | ICD-10-CM

## 2023-02-18 DIAGNOSIS — C773 Secondary and unspecified malignant neoplasm of axilla and upper limb lymph nodes: Secondary | ICD-10-CM | POA: Insufficient documentation

## 2023-02-18 DIAGNOSIS — E119 Type 2 diabetes mellitus without complications: Secondary | ICD-10-CM | POA: Insufficient documentation

## 2023-02-18 DIAGNOSIS — Z7984 Long term (current) use of oral hypoglycemic drugs: Secondary | ICD-10-CM

## 2023-02-18 DIAGNOSIS — Z803 Family history of malignant neoplasm of breast: Secondary | ICD-10-CM | POA: Insufficient documentation

## 2023-02-18 DIAGNOSIS — I10 Essential (primary) hypertension: Secondary | ICD-10-CM

## 2023-02-18 DIAGNOSIS — R293 Abnormal posture: Secondary | ICD-10-CM | POA: Diagnosis not present

## 2023-02-18 LAB — CMP (CANCER CENTER ONLY)
ALT: 15 U/L (ref 0–44)
AST: 14 U/L — ABNORMAL LOW (ref 15–41)
Albumin: 4.3 g/dL (ref 3.5–5.0)
Alkaline Phosphatase: 50 U/L (ref 38–126)
Anion gap: 11 (ref 5–15)
BUN: 23 mg/dL (ref 8–23)
CO2: 27 mmol/L (ref 22–32)
Calcium: 9.5 mg/dL (ref 8.9–10.3)
Chloride: 101 mmol/L (ref 98–111)
Creatinine: 0.9 mg/dL (ref 0.44–1.00)
GFR, Estimated: >60 mL/min (ref >60)
Glucose, Bld: 181 mg/dL — ABNORMAL HIGH (ref 70–99)
Potassium: 3.6 mmol/L (ref 3.5–5.1)
Sodium: 139 mmol/L (ref 135–145)
Total Bilirubin: 0.7 mg/dL (ref 0.3–1.2)
Total Protein: 7.2 g/dL (ref 6.5–8.1)

## 2023-02-18 LAB — CBC WITH DIFFERENTIAL (CANCER CENTER ONLY)
Abs Immature Granulocytes: 0.03 10*3/uL (ref 0.00–0.07)
Basophils Absolute: 0.1 10*3/uL (ref 0.0–0.1)
Basophils Relative: 1 %
Eosinophils Absolute: 0.3 10*3/uL (ref 0.0–0.5)
Eosinophils Relative: 4 %
HCT: 42.1 % (ref 36.0–46.0)
Hemoglobin: 14.2 g/dL (ref 12.0–15.0)
Immature Granulocytes: 0 %
Lymphocytes Relative: 31 %
Lymphs Abs: 2.8 10*3/uL (ref 0.7–4.0)
MCH: 28.4 pg (ref 26.0–34.0)
MCHC: 33.7 g/dL (ref 30.0–36.0)
MCV: 84.2 fL (ref 80.0–100.0)
Monocytes Absolute: 0.6 10*3/uL (ref 0.1–1.0)
Monocytes Relative: 7 %
Neutro Abs: 5.2 10*3/uL (ref 1.7–7.7)
Neutrophils Relative %: 57 %
Platelet Count: 446 10*3/uL — ABNORMAL HIGH (ref 150–400)
RBC: 5 MIL/uL (ref 3.87–5.11)
RDW: 13 % (ref 11.5–15.5)
WBC Count: 8.9 10*3/uL (ref 4.0–10.5)
nRBC: 0 % (ref 0.0–0.2)

## 2023-02-18 LAB — GENETIC SCREENING ORDER

## 2023-02-18 NOTE — Progress Notes (Unsigned)
REFERRING PROVIDER: Rachel Moulds, MD  PRIMARY PROVIDER:  Farris Has, MD  PRIMARY REASON FOR VISIT:  1. Malignant neoplasm of upper-outer quadrant of right breast in female, estrogen receptor positive (HCC)    HISTORY OF PRESENT ILLNESS:   Barbara Jackson, a 67 y.o. female, was seen for a  cancer genetics consultation at the request of Dr. Al Jackson due to a personal and family history of cancer.  Barbara Jackson presents to clinic today to discuss the possibility of a hereditary predisposition to cancer, to discuss genetic testing, and to further clarify her future cancer risks, as well as potential cancer risks for family members.   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 The treatment plan ***.   RISK FACTORS:  Menarche was at age 20.  First live birth at age 29.  OCP use for approximately 0 years.  Ovaries intact: yes.  Uterus intact: yes.  Menopausal status: postmenopausal.  HRT use: 0 years. Colonoscopy: yes;  2018 . Any excessive radiation exposure in the past: no  Past Medical History:  Diagnosis Date   Abnormal pap 1998   Negative Hpv   Anxiety    Arthritis    [name], chronic    Diabetes mellitus    Dysrhythmia    hx of atrial tachycardia and pacs   Esophageal reflux    GERD (gastroesophageal reflux disease)    H/O hiatal hernia    H/O iron deficiency anemia 08/2016   Hypercholesteremia    Hypertension    IDA (iron deficiency anemia)    Obesity    compulsive overeater   Osteoarthritis    Sleep apnea     Past Surgical History:  Procedure Laterality Date   APPENDECTOMY  1975   TOTAL KNEE ARTHROPLASTY  01/12/2012   Procedure: TOTAL KNEE ARTHROPLASTY;  Surgeon: Loanne Drilling, MD;  Location: [facility];  Service: Orthopedics;  Laterality: Left;   TOTAL KNEE ARTHROPLASTY Right 01/10/2019   Procedure: TOTAL KNEE ARTHROPLASTY;  Surgeon:  Ollen Gross, MD;  Location: [facility];  Service: Orthopedics;  Laterality: Right;     Social History   Socioeconomic History   Marital status: Single    Spouse name: Not on file   Number of children: 1   Years of education: Not on file   Highest education level: Not on file  Occupational History    Employer: [employer]   Occupation: retired  Tobacco Use   Smoking status: Never   Smokeless tobacco: Never  Vaping Use   Vaping status: Never Used  Substance and Sexual Activity   Alcohol use: Yes    Alcohol/week: 2.0 standard drinks of alcohol    Types: 2 Standard drinks or equivalent per week    Comment: occassionally   Drug use: No   Sexual activity: Not Currently    Partners: Male    Birth control/protection: Post-menopausal  Other Topics Concern   Not on file  Social History Narrative   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   Lives alone.     Highest level of education:  One year graduate school   Social Determinants of Health   Financial Resource Strain: Not on file  Food Insecurity: Not on file  Transportation Needs: Not on file  Physical Activity: Not on file  Stress: Not on file  Social Connections: Not on file     FAMILY  HISTORY:  We obtained a detailed, 4-generation family history.  Significant diagnoses are listed below: Family History  Problem Relation Age of Onset   Breast cancer Mother 38   Heart attack Father        Deceased, 69   COPD Father    Obesity Sister    Hypertension Brother    Healthy Son    Breast cancer Cousin 53       maternal 1st cousin   Colon cancer Neg Hx    Stomach cancer Neg Hx    Esophageal cancer Neg Hx      Barbara Jackson mother was diagnosed with breast cancer at age 82, she died at age 41. She has one maternal first cousin who was diagnosed with breast cancer at age 54. Her father was diagnosed with prostate cancer in his late 64s, he died at age 66. Barbara Jackson is unaware of previous family history  of genetic testing for hereditary cancer risks. There is no reported Ashkenazi Jewish ancestry.   GENETIC COUNSELING ASSESSMENT: Barbara Jackson is a 67 y.o. female with a personal and family history of cancer which is somewhat suggestive of a hereditary predisposition to cancer. We, therefore, discussed and recommended the following at today's visit.   DISCUSSION: We discussed that 5 - 10% of cancer is hereditary, with most cases of breast cancer associated with BRCA1/2.  There are other genes that can be associated with hereditary breast cancer syndromes.  We discussed that testing is beneficial for several reasons including knowing how to follow individuals after completing their treatment, identifying whether potential treatment options would be beneficial, and understanding if other family members could be at risk for cancer and allowing them to undergo genetic testing.   We reviewed the characteristics, features and inheritance patterns of hereditary cancer syndromes. We also discussed genetic testing, including the appropriate family members to test, the process of testing, insurance coverage and turn-around-time for results. We discussed the implications of a negative, positive, carrier and/or variant of uncertain significant result. We recommended Barbara Jackson pursue genetic testing for a panel that includes genes associated with breast and prostate cancer.   Barbara Jackson elected to have [facility] CancerNext Panel. The CancerNext gene panel offered by [employer] includes sequencing, rearrangement analysis, and RNA analysis for the following 34 genes:   APC, ATM, AXIN2, BARD1, BMPR1A, BRCA1, BRCA2, BRIP1, CDH1, CDK4, CDKN2A, CHEK2, DICER1, HOXB13, EPCAM, GREM1, MLH1, MSH2, MSH3, MSH6, MUTYH, NF1, NTHL1, PALB2, PMS2, POLD1, POLE, PTEN, RAD51C, RAD51D, SMAD4, SMARCA4, STK11, and TP53.   Based on Barbara Jackson personal and family history of cancer, she meets medical criteria for genetic testing.  Despite that she meets criteria, she may still have an out of pocket cost. We discussed that if her out of pocket cost for testing is over $100, the laboratory will call and confirm whether she wants to proceed with testing.  If the out of pocket cost of testing is less than $100 she will be billed by the genetic testing laboratory.   PLAN: After considering the risks, benefits, and limitations, Barbara Jackson provided informed consent to pursue genetic testing and the blood sample was sent to [facility] for analysis of the CancerNext Panel. Results should be available within approximately 2-3 weeks' time, at which point they will be disclosed by telephone to Barbara Jackson, as will any additional recommendations warranted by these results. Barbara Jackson will receive a summary of her genetic counseling visit and a copy of her results once available. This  information will also be available in Epic.   Barbara Jackson questions were answered to her satisfaction today. Our contact information was provided should additional questions or concerns arise. Thank you for the referral and allowing [place] to share in the care of your patient.   Lalla Brothers, MS, [facility] Genetic Counselor [email or web address] (P) [phone]  The patient was seen for a total of 20 minutes in face-to-face genetic counseling.  The patient brought her sister. Drs. Pamelia Hoit and/or Mosetta Putt were available to discuss this case as needed.   _______________________________________________________________________ For Office Staff:  Number of people involved in session: 2 Was an Intern/ student involved with case: no

## 2023-02-18 NOTE — Progress Notes (Cosign Needed Addendum)
Radiation Oncology         [phone] ________________________________  Name: Barbara Jackson        MRN: [id number]  Date of Service: 02/18/2023 DOB: 09-Jul-1955  CC:Farris Has, MD  Harriette Bouillon, MD     REFERRING PHYSICIAN: Harriette Bouillon, MD   DIAGNOSIS: The encounter diagnosis was Malignant neoplasm of upper-outer quadrant of right breast in female, estrogen receptor positive (HCC).   Cancer Staging  Malignant neoplasm of upper-outer quadrant of right breast in female, estrogen receptor positive (HCC) Staging form: Breast, AJCC 8th Edition - Clinical stage from 02/18/2023: Stage IIIB (cT3, cN2, cM0, G3, ER+, PR-, HER2-) - Signed by Rachel Moulds, MD on 02/18/2023 Stage prefix: Initial diagnosis Histologic grading system: 3 grade system Laterality: Right Staged by: Pathologist and managing physician Stage used in treatment planning: Yes National guidelines used in treatment planning: Yes Type of national guideline used in treatment planning: NCCN    HISTORY OF PRESENT ILLNESS: Barbara Jackson is a 67 y.o. female seen in the multidisciplinary breast clinic for a new diagnosis of right breast cancer. The patient was first noted to have a palpable mass on her right breast. She proceeded with diagnostic mammogram that showed 4.7 cm mass in the UOQ along with another 2.3 cm mass posterolateral to the larger mass. Enhanced mammogram demonstrated a 6.2 cm mass and 2.5 cm smaller mass. Abnormal right axillary and subpectoral nodes were also visualized. Accordingly, patient underwent a biopsy of the right breast mass that revealed grade III invasive ductal carcinoma with focal necrosis that was ER positive, PR negative and HER2 negative with a Ki-67 25%. Biopsy of the right axilla nodes revealed invasive ductal carcinoma.   She is seen today to discuss treatment recommendations of her cancer.      PREVIOUS RADIATION THERAPY: No   PAST MEDICAL HISTORY:  Past Medical History:   Diagnosis Date   Abnormal pap 1998   Negative Hpv   Anxiety    Arthritis    [name], chronic    Diabetes mellitus    Dysrhythmia    hx of atrial tachycardia and pacs   Esophageal reflux    GERD (gastroesophageal reflux disease)    H/O hiatal hernia    H/O iron deficiency anemia 08/2016   Hypercholesteremia    Hypertension    IDA (iron deficiency anemia)    Obesity    compulsive overeater   Osteoarthritis    Sleep apnea        PAST SURGICAL HISTORY: Past Surgical History:  Procedure Laterality Date   APPENDECTOMY  1975   TOTAL KNEE ARTHROPLASTY  01/12/2012   Procedure: TOTAL KNEE ARTHROPLASTY;  Surgeon: Loanne Drilling, MD;  Location: [facility];  Service: Orthopedics;  Laterality: Left;   TOTAL KNEE ARTHROPLASTY Right 01/10/2019   Procedure: TOTAL KNEE ARTHROPLASTY;  Surgeon: Ollen Gross, MD;  Location: [facility];  Service: Orthopedics;  Laterality: Right;      FAMILY HISTORY:  Family History  Problem Relation Age of Onset   Breast cancer Mother 30   Heart attack Father        Deceased, 16   COPD Father    Prostate cancer Father 23 - 13   Obesity Sister    Hypertension Brother    Healthy Son    Breast cancer Cousin 92       maternal 1st cousin   Colon cancer Neg Hx    Stomach cancer Neg Hx    Esophageal cancer Neg Hx  SOCIAL HISTORY:  reports that she has never smoked. She has never used smokeless tobacco. She reports current alcohol use of about 2.0 standard drinks of alcohol per week. She reports that she does not use drugs.   ALLERGIES: Diclofenac, Glucotrol [glipizide], Lisinopril, and Adhesive [tape]   MEDICATIONS:  Current Outpatient Medications  Medication Sig Dispense Refill   albuterol (VENTOLIN HFA) 108 (90 Base) MCG/ACT inhaler Inhale 2 puffs into the lungs every 6 (six) hours as needed for wheezing or shortness of breath.     atorvastatin (LIPITOR) 20 MG tablet Take 20 mg by mouth daily with supper.   0   Blood Glucose  Monitoring Suppl (ONE TOUCH ULTRA 2) w/Device KIT Use to check blood sugar 2-3 times a day 1 each 0   diazepam (VALIUM) 5 MG tablet Take 5-10 mg by mouth every 8 (eight) hours as needed for anxiety.     diphenhydrAMINE (BENADRYL) 25 mg capsule Take 25 mg by mouth every 6 (six) hours as needed for allergies.      fluticasone (FLONASE) 50 MCG/ACT nasal spray Place 2 sprays into both nostrils daily as needed for allergies.     FREESTYLE TEST STRIPS test strip   5   Iron-FA-B Cmp-C-Biot-Probiotic (FUSION PLUS) CAPS Take 1 capsule by mouth daily.     losartan-hydrochlorothiazide (HYZAAR) 100-25 MG per tablet Take 1 tablet by mouth every morning.     metFORMIN (GLUCOPHAGE-XR) 500 MG 24 hr tablet TAKE 4 TABLETS BY MOUTH DAILY WITH SUPPER. 360 tablet 3   omeprazole (PRILOSEC) 40 MG capsule Take 40 mg by mouth daily before breakfast.     potassium chloride (KLOR-CON) 10 MEQ tablet Take 10 mEq by mouth daily.     sertraline (ZOLOFT) 50 MG tablet Take 50 mg by mouth daily with supper.     tirzepatide (MOUNJARO) 12.5 MG/0.5ML Pen INJECT 12.5 MG SUBCUTANEOUSLY ONE TIME PER WEEK 6 mL 3   Vitamin D, Ergocalciferol, (DRISDOL) 1.25 MG (50000 UNIT) CAPS capsule Take 1 capsule (50,000 Units total) by mouth every 7 (seven) days. 12 capsule 0   zolpidem (AMBIEN) 10 MG tablet Take 10 mg by mouth at bedtime as needed for sleep.      No current facility-administered medications for this encounter.     REVIEW OF SYSTEMS: On review of systems, the patient reports that she is doing well overall. No breast specific complaints are verbalized.        PHYSICAL EXAM:  Wt Readings from Last 3 Encounters:  02/18/23 221 lb (100.2 kg)  02/05/23 216 lb (98 kg)  02/04/23 222 lb (100.7 kg)   Temp Readings from Last 3 Encounters:  02/18/23 (!) 97.2 F (36.2 C) (Temporal)  02/05/23 97.7 F (36.5 C)  01/30/23 98 F (36.7 C) (Oral)   BP Readings from Last 3 Encounters:  02/18/23 (!) 141/75  02/05/23 100/69  02/04/23  126/80   Pulse Readings from Last 3 Encounters:  02/18/23 93  02/05/23 73  02/04/23 91    In general this is a well appearing female in no acute distress. She's alert and oriented x4 and appropriate throughout the examination. Cardiopulmonary assessment is negative for acute distress and she exhibits normal effort. Bilateral breast exam is deferred.    ECOG = 1  0 - Asymptomatic (Fully active, able to carry on all predisease activities without restriction)  1 - Symptomatic but completely ambulatory (Restricted in physically strenuous activity but ambulatory and able to carry out work of a light or sedentary nature. For  example, light housework, office work)  2 - Symptomatic, <50% in bed during the day (Ambulatory and capable of all self care but unable to carry out any work activities. Up and about more than 50% of waking hours)  3 - Symptomatic, >50% in bed, but not bedbound (Capable of only limited self-care, confined to bed or chair 50% or more of waking hours)  4 - Bedbound (Completely disabled. Cannot carry on any self-care. Totally confined to bed or chair)  5 - Death   Santiago Glad MM, Creech RH, Tormey DC, et al. [phone]). "Toxicity and response criteria of the [facility]". Am. Evlyn Clines. Oncol. 5 (6): 649-55    LABORATORY DATA:  Lab Results  Component Value Date   WBC 8.9 02/18/2023   HGB 14.2 02/18/2023   HCT 42.1 02/18/2023   MCV 84.2 02/18/2023   PLT 446 (H) 02/18/2023   Lab Results  Component Value Date   NA 139 02/18/2023   K 3.6 02/18/2023   CL 101 02/18/2023   CO2 27 02/18/2023   Lab Results  Component Value Date   ALT 15 02/18/2023   AST 14 (L) 02/18/2023   ALKPHOS 50 02/18/2023   BILITOT 0.7 02/18/2023      RADIOGRAPHY: No results found. We personally reviewed her imaging.      IMPRESSION/PLAN: 1. Grade III Invasive ductal carcinoma (cT3, cN2, cM0) ER+, PR-, HER2- of the right breast Dr. Mitzi Hansen discussed the pathology findings  and reviewed the nature of breast cancer. The consensus from the breast conference includes staging CT of the C/A/P followed by neoadjuvant chemotherapy, surgery, radiation, and anti-hormone therapy. Dr. Mitzi Hansen recommends external beam radiotherapy to the breast following her lumpectomy to reduce risks of local recurrence followed by antiestrogen therapy. We discussed the risks, benefits, short, and long term effects of radiotherapy, as well as the curative intent, and the patient is interested in proceeding. Dr. Mitzi Hansen discussed the delivery and logistics of radiotherapy and anticipates a course of 6.5 weeks of radiotherapy to the right breast. We will see her back a few weeks after surgery to discuss the simulation process and anticipate starting radiotherapy about 4-6 weeks after surgery.   2. Possible genetic predisposition to malignancy. The patient is a candidate for genetic testing given her personal and family history. She will meet with our geneticist today in clinic.   In a visit lasting 60 minutes, greater than 50% of the time was spent face to face reviewing her case, as well as in preparation of, discussing, and coordinating the patient's care.  The above documentation reflects my direct findings during this shared patient visit. Please see the separate note by Dr. Mitzi Hansen on this date for the remainder of the patient's plan of care.    Joyice Faster, [place]    **Disclaimer: This note was dictated with voice recognition software. Similar sounding words can inadvertently be transcribed and this note may contain transcription errors which may not have been corrected upon publication of note.**

## 2023-02-18 NOTE — Therapy (Signed)
OUTPATIENT PHYSICAL THERAPY BREAST CANCER BASELINE EVALUATION   Patient Name: Barbara Jackson MRN: [id number] DOB:02-Jun-1956, 67 y.o., female Today's Date: 02/18/2023  END OF SESSION:  PT End of Session - 02/18/23 0904     Visit Number 1    Number of Visits 2    Date for PT Re-Evaluation 08/18/23    PT Start Time 1103    PT Stop Time 1113   Also saw pt from 1146 to 1200 for a total of 24 min   PT Time Calculation (min) 10 min    Activity Tolerance Patient tolerated treatment well    Behavior During Therapy [facility] for tasks assessed/performed             Past Medical History:  Diagnosis Date   Abnormal pap 1998   Negative Hpv   Anxiety    Arthritis    [name], chronic    Diabetes mellitus    Dysrhythmia    hx of atrial tachycardia and pacs   Esophageal reflux    GERD (gastroesophageal reflux disease)    H/O hiatal hernia    H/O iron deficiency anemia 08/2016   Hypercholesteremia    Hypertension    IDA (iron deficiency anemia)    Obesity    compulsive overeater   Osteoarthritis    Sleep apnea    Past Surgical History:  Procedure Laterality Date   APPENDECTOMY  1975   TOTAL KNEE ARTHROPLASTY  01/12/2012   Procedure: TOTAL KNEE ARTHROPLASTY;  Surgeon: Loanne Drilling, MD;  Location: [facility];  Service: Orthopedics;  Laterality: Left;   TOTAL KNEE ARTHROPLASTY Right 01/10/2019   Procedure: TOTAL KNEE ARTHROPLASTY;  Surgeon: Ollen Gross, MD;  Location: [facility];  Service: Orthopedics;  Laterality: Right;    Patient Active Problem List   Diagnosis Date Noted   Family history of breast cancer 02/18/2023   Malignant neoplasm of upper-outer quadrant of right breast in female, estrogen receptor positive (HCC) 02/17/2023   Right Breast Lump 02/05/2023   BMI 36.0-36.9,adult 08/20/2022   Obesity, Beginning BMI 40.27 08/20/2022   Chronic left shoulder pain 07/09/2022   BMI 38.0-38.9,adult 07/09/2022   Obesity, Beginning BMI 40.27 07/09/2022   Diabetes  mellitus (HCC) 03/11/2022   Vitamin D insufficiency 02/10/2022   Depression 02/10/2022   Hypercalcemia 12/10/2021   Other fatigue 12/04/2021   SOB (shortness of breath) on exertion 12/04/2021   Essential hypertension 12/04/2021   Vitamin D deficiency 12/04/2021   Eating disorder 12/04/2021   Depression screening 12/04/2021   Pain in right hand 11/23/2019   Pain of left hand 11/23/2019   [name], chronic    Aftercare 02/15/2019   History of total knee replacement, right 02/15/2019   Stiffness of right knee 01/17/2019   Osteoarthritis of right knee 01/10/2019   Class 3 severe obesity with serious comorbidity and body mass index (BMI) of 40.0 to 44.9 in adult [facility]) 09/16/2018   Hyperlipidemia 08/17/2018   History of total left knee replacement 08/05/2017   Open angle with borderline findings and low glaucoma risk in both eyes 04/27/2017   OSA (obstructive sleep apnea) 07/08/2016   Hypertension    Type 2 diabetes mellitus with hyperglycemia, without long-term current use of insulin (HCC)    Primary osteoarthritis of right knee 01/12/2012    REFERRING PROVIDER: Dr. Harriette Bouillon  REFERRING DIAG: Right breast cancer  THERAPY DIAG:  Malignant neoplasm of upper-outer quadrant of right breast in female, estrogen receptor positive (HCC)  Abnormal posture  Rationale for  Evaluation and Treatment: Rehabilitation  ONSET DATE: 02/03/2023  SUBJECTIVE:                                                                                                                                                                                           SUBJECTIVE STATEMENT: Patient reports she is here today to be seen by her medical team for her newly diagnosed right breast cancer.   PERTINENT HISTORY:  Patient was diagnosed on 02/03/2023 with right grade 3 invasive ductal carcinoma breast cancer. It measures 2.2 and 4.7 cm and is located in the upper outer quadrant. It is ER positive, PR and HER2  negative with a Ki67 of 25%.   PATIENT GOALS:   reduce lymphedema risk and learn post op HEP.   PAIN:  Are you having pain? No  PRECAUTIONS: Active CA   RED FLAGS: None   HAND DOMINANCE: right  WEIGHT BEARING RESTRICTIONS: No  FALLS:  Has patient fallen in last 6 months? No  LIVING ENVIRONMENT: Patient lives with: alone Lives in: House/apartment Has following equipment at home: None  OCCUPATION: Retired  LEISURE: She walks, swims, and bikes 4-5x/week for 20-30 min  PRIOR LEVEL OF FUNCTION: Independent   OBJECTIVE:  COGNITION: Overall cognitive status: Within functional limits for tasks assessed    POSTURE:  Forward head and rounded shoulders posture  UPPER EXTREMITY AROM/PROM:  A/PROM RIGHT   eval   Shoulder extension 41  Shoulder flexion 147  Shoulder abduction 167  Shoulder internal rotation 64  Shoulder external rotation 77    (Blank rows = not tested)  A/PROM LEFT   eval  Shoulder extension 43  Shoulder flexion 138  Shoulder abduction 163  Shoulder internal rotation 75  Shoulder external rotation 80    (Blank rows = not tested)  CERVICAL AROM: All within normal limits  UPPER EXTREMITY STRENGTH: WNL  LYMPHEDEMA ASSESSMENTS:   LANDMARK RIGHT   eval  10 cm proximal to olecranon process 33.9  Olecranon process 29  10 cm proximal to ulnar styloid process 28  Just proximal to ulnar styloid process 18.3  Across hand at thumb web space 18.8  At base of 2nd digit 6  (Blank rows = not tested)  LANDMARK LEFT   eval  10 cm proximal to olecranon process 35.2  Olecranon process 28.7  10 cm proximal to ulnar styloid process 26.5  Just proximal to ulnar styloid process 18.1  Across hand at thumb web space 18.5  At base of 2nd digit 5.8  (Blank rows = not tested)  L-DEX LYMPHEDEMA SCREENING:  The patient was assessed using the L-Dex machine today to produce  a lymphedema index baseline score. The patient will be reassessed on a regular basis  (typically every 3 months) to obtain new L-Dex scores. If the score is > 6.5 points away from his/her baseline score indicating onset of subclinical lymphedema, it will be recommended to wear a compression garment for 4 weeks, 12 hours per day and then be reassessed. If the score continues to be > 6.5 points from baseline at reassessment, we will initiate lymphedema treatment. Assessing in this manner has a 95% rate of preventing clinically significant lymphedema.   L-DEX FLOWSHEETS - 02/18/23 0900       L-DEX LYMPHEDEMA SCREENING   Measurement Type Unilateral    L-DEX MEASUREMENT EXTREMITY Upper Extremity    POSITION  Standing    DOMINANT SIDE Right    At Risk Side Right    BASELINE SCORE (UNILATERAL) 1.1             QUICK DASH SURVEY:  Neldon Mc - 02/18/23 0001     Open a tight or new jar Mild difficulty    Do heavy household chores (wash walls, wash floors) Moderate difficulty    Carry a shopping bag or briefcase No difficulty    Wash your back No difficulty    Use a knife to cut food No difficulty    Recreational activities in which you take some force or impact through your arm, shoulder, or hand (golf, hammering, tennis) Mild difficulty    During the past week, to what extent has your arm, shoulder or hand problem interfered with your normal social activities with family, friends, neighbors, or groups? Not at all    During the past week, to what extent has your arm, shoulder or hand problem limited your work or other regular daily activities Not at all    Arm, shoulder, or hand pain. None    Tingling (pins and needles) in your arm, shoulder, or hand None    Difficulty Sleeping No difficulty    DASH Score 9.09 %              PATIENT EDUCATION:  Education details: Lymphedema risk reduction and post op shoulder/posture HEP Person educated: Patient Education method: Explanation, Demonstration, Handout Education comprehension: Patient verbalized understanding and  returned demonstration  HOME EXERCISE PROGRAM: Patient was instructed today in a home exercise program today for post op shoulder range of motion. These included active assist shoulder flexion in sitting, scapular retraction, wall walking with shoulder abduction, and hands behind head external rotation.  She was encouraged to do these twice a day, holding 3 seconds and repeating 5 times when permitted by her physician.   ASSESSMENT:  CLINICAL IMPRESSION: Patient was diagnosed on 02/03/2023 with right grade 3 invasive ductal carcinoma breast cancer. It measures 2.2 and 4.7 cm and is located in the upper outer quadrant. It is ER positive, PR and HER2 negative with a Ki67 of 25%. Her multidisciplinary medical team met prior to her assessments to determine a recommended treatment plan. She is planning to have neoadjuvant chemotherapy followed by a right mastectomy or lumpectomy and axillary lymph node dissection, radiation, and anti-estrogen therapy. She will benefit from a post op PT reassessment to determine needs and from L-Dex screens every 3 months for 2 years to detect subclinical lymphedema.  Pt will benefit from skilled therapeutic intervention to improve on the following deficits: Decreased knowledge of precautions, impaired UE functional use, pain, decreased ROM, postural dysfunction.   PT treatment/interventions: ADL/self-care home management, pt/family education, therapeutic exercise  REHAB POTENTIAL: Excellent  CLINICAL DECISION MAKING: Stable/uncomplicated  EVALUATION COMPLEXITY: Low   GOALS: Goals reviewed with patient? YES  LONG TERM GOALS: (STG=LTG)    Name Target Date Goal status  1 Pt will be able to verbalize understanding of pertinent lymphedema risk reduction practices relevant to her dx specifically related to skin care.  Baseline:  No knowledge 02/18/2023 Achieved at eval  2 Pt will be able to return demo and/or verbalize understanding of the post op HEP related to  regaining shoulder ROM. Baseline:  No knowledge 02/18/2023 Achieved at eval  3 Pt will be able to verbalize understanding of the importance of attending the post op After Breast CA Class for further lymphedema risk reduction education and therapeutic exercise.  Baseline:  No knowledge 02/18/2023 Achieved at eval  4 Pt will demo she has regained full shoulder ROM and function post operatively compared to baselines.  Baseline: See objective measurements taken today. 08/18/2023     PLAN:  PT FREQUENCY/DURATION: EVAL and 1 follow up appointment.   PLAN FOR NEXT SESSION: will reassess 3-4 weeks post op to determine needs.   Patient will follow up at outpatient cancer rehab 3-4 weeks following surgery.  If the patient requires physical therapy at that time, a specific plan will be dictated and sent to the referring physician for approval. The patient was educated today on appropriate basic range of motion exercises to begin post operatively and the importance of attending the After Breast Cancer class following surgery.  Patient was educated today on lymphedema risk reduction practices as it pertains to recommendations that will benefit the patient immediately following surgery.  She verbalized good understanding.    Physical Therapy Information for After Breast Cancer Surgery/Treatment:  Lymphedema is a swelling condition that you may be at risk for in your arm if you have lymph nodes removed from the armpit area.  After a sentinel node biopsy, the risk is approximately 5-9% and is higher after an axillary node dissection.  There is treatment available for this condition and it is not life-threatening.  Contact your physician or physical therapist with concerns. You may begin the 4 shoulder/posture exercises (see additional sheet) when permitted by your physician (typically a week after surgery).  If you have drains, you may need to wait until those are removed before beginning range of motion exercises.  A  general recommendation is to not lift your arms above shoulder height until drains are removed.  These exercises should be done to your tolerance and gently.  This is not a "no pain/no gain" type of recovery so listen to your body and stretch into the range of motion that you can tolerate, stopping if you have pain.  If you are having immediate reconstruction, ask your plastic surgeon about doing exercises as he or she may want you to wait. We encourage you to attend the free one time ABC (After Breast Cancer) class offered by [facility].  You will learn information related to lymphedema risk, prevention and treatment and additional exercises to regain mobility following surgery.  You can call [phone] for more information.  This is offered the 1st and 3rd Monday of each month.  You only attend the class one time. While undergoing any medical procedure or treatment, try to avoid blood pressure being taken or needle sticks from occurring on the arm on the side of cancer.   This recommendation begins after surgery and continues for the rest of your life.  This  may help reduce your risk of getting lymphedema (swelling in your arm). An excellent resource for those seeking information on lymphedema is the National Lymphedema Network's web site. It can be accessed at www.lymphnet.org If you notice swelling in your hand, arm or breast at any time following surgery (even if it is many years from now), please contact your doctor or physical therapist to discuss this.  Lymphedema can be treated at any time but it is easier for you if it is treated early on.  If you feel like your shoulder motion is not returning to normal in a reasonable amount of time, please contact your surgeon or physical therapist.  [facility] [phone]. [address], [place] [address]  ABC CLASS After Breast Cancer Class  After Breast Cancer Class is a specially  designed exercise class to assist you in a safe recover after having breast cancer surgery.  In this class you will learn how to get back to full function whether your drains were just removed or if you had surgery a month ago.  This one-time class is held the 1st and 3rd Monday of every month from 11:00 a.m. until 12:00 noon virtually.  This class is FREE and space is limited. For more information or to register for the next available class, call [phone].  Class Goals  Understand specific stretches to improve the flexibility of you chest and shoulder. Learn ways to safely strengthen your upper body and improve your posture. Understand the warning signs of infection and why you may be at risk for an arm infection. Learn about Lymphedema and prevention.  ** You do not attend this class until after surgery.  Drains must be removed to participate  Patient was instructed today in a home exercise program today for post op shoulder range of motion. These included active assist shoulder flexion in sitting, scapular retraction, wall walking with shoulder abduction, and hands behind head external rotation.  She was encouraged to do these twice a day, holding 3 seconds and repeating 5 times when permitted by her physician.  Bethann Punches, [place] 02/18/23 2:46 PM

## 2023-02-18 NOTE — Assessment & Plan Note (Signed)
This is a very pleasant 67 year old postmenopausal female patient with newly diagnosed right breast stage IIIb grade 3 IDC with axillary as well as subpectoral metastatic lymphadenopathy, ER +100% strong staining, PR negative, HER2 negative, Ki-67 of 25% referred to breast [facility] for additional recommendations.  Patient arrived to the appointment today with her sister.  We have discussed about neoadjuvant chemotherapy given the large size of the tumor as well as multiple sets of lymph nodes.  Have proposed to do neoadjuvant Adriamycin, cyclophosphamide and paclitaxel followed by surgery followed by adjuvant radiation and antiestrogen therapy with CDK 4 6 inhibition.  Have reviewed the schedule for dose dense AC-T, adverse effects from AC-T including but not limited to fatigue, nausea, constipation, diarrhea, cytopenias, cardiac toxicity, alopecia and peripheral neuropathy.  She understands that we will have a chemotherapy class to go over the chemotherapy side effects in much greater detail.  We have proposed prechemo MRI and post chemo MRI to assess response in addition to clinical exam.  She will also need a baseline echocardiogram, port placement for chemo administration.  She had excellent questions about role of Oncotype, also wants to seek opinion.  At this time since the proposed tumor size is over 5 cm and there appeared to be multiple positive lymph nodes, I do not believe Oncotype will be reasonable.  I have also proposed staging scans and she is a stage III breast cancer.  She understands if she has any distant metastatic disease, treatment plan will change.    She had multiple sets of questions written for all send went over each question, have explained the pathology in great detail to her.  All her questions were answered to the best my knowledge.  If she were to seek a second opinion, I asked her to keep [place] posted.  Otherwise she will proceed with staging scans, echo, port placement and anticipate  initiation of chemotherapy in 2 weeks

## 2023-02-18 NOTE — Progress Notes (Signed)
[facility] CONSULT NOTE  Patient Care Team: Farris Has, MD as PCP - General (Family Medicine) Tanda Rockers, NP as Nurse Practitioner (Obstetrics and Gynecology) Donnelly Angelica, RN as Oncology Nurse Navigator Lu Duffel, Margretta Ditty, RN as Oncology Nurse Navigator Rachel Moulds, MD as Consulting Physician (Hematology and Oncology) Harriette Bouillon, MD as Consulting Physician (General Surgery) Dorothy Puffer, MD as Consulting Physician (Radiation Oncology)  CHIEF COMPLAINTS/PURPOSE OF CONSULTATION:  Newly diagnosed breast cancer  HISTORY OF PRESENTING ILLNESS:  Barbara Jackson 67 y.o. female is here because of recent diagnosis of right breast IDC  I reviewed her records extensively and collaborated the history with the patient.  SUMMARY OF ONCOLOGIC HISTORY: Oncology History  Malignant neoplasm of upper-outer quadrant of right breast in female, estrogen receptor positive (HCC)  02/10/2023 Pathology Results   Right breast needle core biopsy showed overall grade 3 invasive ductal carcinoma, right axillary lymph node with invasive ductal carcinoma, prognostic showed ER 100% positive strong staining PR negative, Ki-67 of 25% and HER2 0   02/11/2023 Mammogram   Patient had a palpable mass and hence had a diagnostic mammogram which showed dominant 4.4 x 6.2 x 4.7 cm mass highly suspicious for right breast mass in the upper outer quadrant.  Abnormal prominent right axillary lymph nodes.  There is a second smaller irregular high conspicuity enhancing mass posterior and lateral to the dominant mass measuring 2.5 x 1.3 x 1 cm.  Prominent right axillary and possibly subpectoral nodes.   02/17/2023 Initial Diagnosis   Malignant neoplasm of upper-outer quadrant of right breast in female, estrogen receptor positive (HCC)   02/18/2023 Cancer Staging   Staging form: Breast, AJCC 8th Edition - Clinical stage from 02/18/2023: Stage IIIB (cT3, cN2, cM0, G3, ER+, PR-, HER2-) - Signed by Rachel Moulds, MD on 02/18/2023 Stage prefix: Initial diagnosis Histologic grading system: 3 grade system Laterality: Right Staged by: Pathologist and managing physician Stage used in treatment planning: Yes National guidelines used in treatment planning: Yes Type of national guideline used in treatment planning: NCCN   03/04/2023 -  Chemotherapy   Patient is on Treatment Plan : BREAST ADJUVANT DOSE DENSE AC q14d / PACLitaxel q7d      This is a pleasant 67 year old postmenopausal female patient with newly diagnosed right breast IDC who presented to the breast [facility] today with her sister.  At baseline she has diabetes but denies any peripheral neuropathy.  She is on all medications for diabetes, also has hypertension, dyslipidemia and history of osteoarthritis.  She fell this mass on August 15, absolutely denies any breast lumps before that.  She had an annual gynecological exam as well as a mammogram last year.  Besides a palpable breast mass, she denies any cough, chest pain, shortness of breath, bone pains, change in bowel habits, urinary habits or neurological complaints.  Rest of the pertinent 10 point ROS reviewed and negative  MEDICAL HISTORY:  Past Medical History:  Diagnosis Date   Abnormal pap 1998   Negative Hpv   Anxiety    Arthritis    [name], chronic    Diabetes mellitus    Dysrhythmia    hx of atrial tachycardia and pacs   Esophageal reflux    GERD (gastroesophageal reflux disease)    H/O hiatal hernia    H/O iron deficiency anemia 08/2016   Hypercholesteremia    Hypertension    IDA (iron deficiency anemia)    Obesity    compulsive overeater   Osteoarthritis  Sleep apnea     SURGICAL HISTORY: Past Surgical History:  Procedure Laterality Date   APPENDECTOMY  1975   TOTAL KNEE ARTHROPLASTY  01/12/2012   Procedure: TOTAL KNEE ARTHROPLASTY;  Surgeon: Loanne Drilling, MD;  Location: [facility];  Service: Orthopedics;  Laterality: Left;   TOTAL KNEE ARTHROPLASTY Right  01/10/2019   Procedure: TOTAL KNEE ARTHROPLASTY;  Surgeon: Ollen Gross, MD;  Location: [facility];  Service: Orthopedics;  Laterality: Right;     SOCIAL HISTORY: Social History   Socioeconomic History   Marital status: Single    Spouse name: Not on file   Number of children: 1   Years of education: Not on file   Highest education level: Not on file  Occupational History    Employer: [employer]    Occupation: retired  Tobacco Use   Smoking status: Never   Smokeless tobacco: Never  Vaping Use   Vaping status: Never Used  Substance and Sexual Activity   Alcohol use: Yes    Alcohol/week: 2.0 standard drinks of alcohol    Types: 2 Standard drinks or equivalent per week    Comment: occassionally   Drug use: No   Sexual activity: Not Currently    Partners: Male    Birth control/protection: Post-menopausal  Other Topics Concern   Not on file  Social History Narrative   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   Lives alone.     Highest level of education:  One year graduate school   Social Determinants of Health   Financial Resource Strain: Not on file  Food Insecurity: Not on file  Transportation Needs: Not on file  Physical Activity: Not on file  Stress: Not on file  Social Connections: Not on file  Intimate Partner Violence: Not on file    FAMILY HISTORY: Family History  Problem Relation Age of Onset   Breast cancer Mother 53   Heart attack Father        Deceased, 64   COPD Father    Prostate cancer Father 41 - 12   Obesity Sister    Hypertension Brother    Healthy Son    Breast cancer Cousin 79       maternal 1st cousin   Colon cancer Neg Hx    Stomach cancer Neg Hx    Esophageal cancer Neg Hx     ALLERGIES:  is allergic to diclofenac, glucotrol [glipizide], lisinopril, and adhesive [tape].  MEDICATIONS:  Current Outpatient Medications  Medication Sig Dispense Refill   atorvastatin (LIPITOR) 20 MG tablet Take 20 mg by mouth daily with  supper.   0   diazepam (VALIUM) 5 MG tablet Take 5-10 mg by mouth every 8 (eight) hours as needed for anxiety.     fluticasone (FLONASE) 50 MCG/ACT nasal spray Place 2 sprays into both nostrils daily as needed for allergies.     Iron-FA-B Cmp-C-Biot-Probiotic (FUSION PLUS) CAPS Take 1 capsule by mouth daily.     losartan-hydrochlorothiazide (HYZAAR) 100-25 MG per tablet Take 1 tablet by mouth every morning.     metFORMIN (GLUCOPHAGE-XR) 500 MG 24 hr tablet TAKE 4 TABLETS BY MOUTH DAILY WITH SUPPER. 360 tablet 3   omeprazole (PRILOSEC) 40 MG capsule Take 40 mg by mouth daily before breakfast.     potassium chloride (KLOR-CON) 10 MEQ tablet Take 10 mEq by mouth daily.     sertraline (ZOLOFT) 50 MG tablet Take 50 mg by mouth daily with supper.     tirzepatide [facility])  12.5 MG/0.5ML Pen INJECT 12.5 MG SUBCUTANEOUSLY ONE TIME PER WEEK 6 mL 3   Vitamin D, Ergocalciferol, (DRISDOL) 1.25 MG (50000 UNIT) CAPS capsule Take 1 capsule (50,000 Units total) by mouth every 7 (seven) days. 12 capsule 0   zolpidem (AMBIEN) 10 MG tablet Take 10 mg by mouth at bedtime as needed for sleep.      albuterol (VENTOLIN HFA) 108 (90 Base) MCG/ACT inhaler Inhale 2 puffs into the lungs every 6 (six) hours as needed for wheezing or shortness of breath.     Blood Glucose Monitoring Suppl (ONE TOUCH ULTRA 2) w/Device KIT Use to check blood sugar 2-3 times a day 1 each 0   diphenhydrAMINE (BENADRYL) 25 mg capsule Take 25 mg by mouth every 6 (six) hours as needed for allergies.      FREESTYLE TEST STRIPS test strip   5   No current facility-administered medications for this visit.    REVIEW OF SYSTEMS:   Constitutional: Denies fevers, chills or abnormal night sweats Eyes: Denies blurriness of vision, double vision or watery eyes Ears, nose, mouth, throat, and face: Denies mucositis or sore throat Respiratory: Denies cough, dyspnea or wheezes Cardiovascular: Denies palpitation, chest discomfort or lower extremity  swelling Gastrointestinal:  Denies nausea, heartburn or change in bowel habits Skin: Denies abnormal skin rashes Lymphatics: Denies new lymphadenopathy or easy bruising Neurological:Denies numbness, tingling or new weaknesses Behavioral/Psych: Mood is stable, no new changes  Breast: Palpable right breast mass All other systems were reviewed with the patient and are negative.  PHYSICAL EXAMINATION: ECOG PERFORMANCE STATUS: 0 - Asymptomatic  Vitals:   02/18/23 0905  BP: (!) 141/75  Pulse: 93  Resp: 18  Temp: (!) 97.2 F (36.2 C)  SpO2: 98%   Filed Weights   02/18/23 0905  Weight: 221 lb (100.2 kg)    GENERAL:alert, no distress and comfortable, obese SKIN: skin color, texture, turgor are normal, no rashes or significant lesions EYES: normal, conjunctiva are pink and non-injected, sclera clear OROPHARYNX:no exudate, no erythema and lips, buccal mucosa, and tongue normal  NECK: supple, thyroid normal size, non-tender, without nodularity LYMPH:  no palpable lymphadenopathy in the cervical, axillary or inguinal LUNGS: clear to auscultation and percussion with normal breathing effort HEART: regular rate & rhythm and no murmurs and no lower extremity edema ABDOMEN:abdomen soft, non-tender and normal bowel sounds Musculoskeletal:no cyanosis of digits and no clubbing  PSYCH: alert & oriented x 3 with fluent speech NEURO: no focal motor/sensory deficits BREAST: Palpable right breast mass in the upper outer quadrant measuring over 5 cm on physical exam, palpable right axillary lymphadenopathy.  LABORATORY DATA:  I have reviewed the data as listed Lab Results  Component Value Date   WBC 8.9 02/18/2023   HGB 14.2 02/18/2023   HCT 42.1 02/18/2023   MCV 84.2 02/18/2023   PLT 446 (H) 02/18/2023   Lab Results  Component Value Date   NA 139 02/18/2023   K 3.6 02/18/2023   CL 101 02/18/2023   CO2 27 02/18/2023    RADIOGRAPHIC STUDIES: I have personally reviewed the radiological  reports and agreed with the findings in the report.  ASSESSMENT AND PLAN:  Malignant neoplasm of upper-outer quadrant of right breast in female, estrogen receptor positive (HCC) This is a very pleasant 67 year old postmenopausal female patient with newly diagnosed right breast stage IIIb grade 3 IDC with axillary as well as subpectoral metastatic lymphadenopathy, ER +100% strong staining, PR negative, HER2 negative, Ki-67 of 25% referred to breast MDC for  additional recommendations.  Patient arrived to the appointment today with her sister.  We have discussed about neoadjuvant chemotherapy given the large size of the tumor as well as multiple sets of lymph nodes.  Have proposed to do neoadjuvant Adriamycin, cyclophosphamide and paclitaxel followed by surgery followed by adjuvant radiation and antiestrogen therapy with CDK 4 6 inhibition.  Have reviewed the schedule for dose dense AC-T, adverse effects from AC-T including but not limited to fatigue, nausea, constipation, diarrhea, cytopenias, cardiac toxicity, alopecia and peripheral neuropathy.  She understands that we will have a chemotherapy class to go over the chemotherapy side effects in much greater detail.  We have proposed prechemo MRI and post chemo MRI to assess response in addition to clinical exam.  She will also need a baseline echocardiogram, port placement for chemo administration.  She had excellent questions about role of Oncotype, also wants to seek opinion.  At this time since the proposed tumor size is over 5 cm and there appeared to be multiple positive lymph nodes, I do not believe Oncotype will be reasonable.  I have also proposed staging scans and she is a stage III breast cancer.  She understands if she has any distant metastatic disease, treatment plan will change.    She had multiple sets of questions written for all send went over each question, have explained the pathology in great detail to her.  All her questions were answered  to the best my knowledge.  If she were to seek a second opinion, I asked her to keep [place] posted.  Otherwise she will proceed with staging scans, echo, port placement and anticipate initiation of chemotherapy in 2 weeks  Total time spent: 75 minutes All questions were answered. The patient knows to call the clinic with any problems, questions or concerns.    Rachel Moulds, MD 02/18/23

## 2023-02-18 NOTE — Progress Notes (Signed)
START ON PATHWAY REGIMEN - Breast     Cycles 1 through 4: A cycle is every 14 days:     Doxorubicin      Cyclophosphamide      [id number]-[id number]    Cycles 5 through 16: A cycle is every 7 days:     Paclitaxel   **Always confirm dose/schedule in your pharmacy ordering system**  Patient Characteristics: Preoperative or Nonsurgical Candidate, M0 (Clinical Staging), Up to cT4c, Any N, M0, Neoadjuvant Therapy followed by Surgery, Invasive Disease, Chemotherapy, HER2 Negative, ER Positive Therapeutic Status: Preoperative or Nonsurgical Candidate, M0 (Clinical Staging) AJCC M Category: cM0 AJCC Grade: G3 ER Status: Positive (+) AJCC 8 Stage Grouping: IIIB HER2 Status: Negative (-) AJCC T Category: cT3 AJCC N Category: cN2 PR Status: Negative (-) Breast Surgical Plan: Neoadjuvant Therapy followed by Surgery Intent of Therapy: Curative Intent, Discussed with Patient

## 2023-02-19 ENCOUNTER — Telehealth: Payer: Self-pay | Admitting: Obstetrics and Gynecology

## 2023-02-19 ENCOUNTER — Encounter: Payer: Self-pay | Admitting: Internal Medicine

## 2023-02-19 ENCOUNTER — Telehealth: Payer: Self-pay | Admitting: Hematology and Oncology

## 2023-02-19 ENCOUNTER — Telehealth: Payer: Self-pay | Admitting: *Deleted

## 2023-02-19 ENCOUNTER — Encounter: Payer: Self-pay | Admitting: *Deleted

## 2023-02-19 ENCOUNTER — Other Ambulatory Visit: Payer: Self-pay

## 2023-02-19 ENCOUNTER — Encounter: Payer: Self-pay | Admitting: Hematology and Oncology

## 2023-02-19 NOTE — Telephone Encounter (Signed)
Patient called looking for her purse originally, but then wanted to talk about yesterdays appointment, told me how she was feeling and wanted to know if or how she could go about switching providers I told her I would get with the team and see what could be done.

## 2023-02-19 NOTE — Telephone Encounter (Signed)
Spoke with patient to follow up from [facility] and discuss her desire to switch to another MD.  She states when she met with Dr. Al Pimple yesterday she wasn't in a good place mentally. I offered her a 2nd visit with Dr. Al Pimple but she prefers to see Dr. Pamelia Hoit.  Appt made and verified for 9/10 at 1:15pm with Dr. Pamelia Hoit.  Denies any other questions at this time.  Encouraged her to call should anything arise. Patient verbalized understanding.

## 2023-02-19 NOTE — Telephone Encounter (Signed)
Phone call to patient regarding her recent dx of breast cancer.  I reached out to offer support and care.

## 2023-02-20 ENCOUNTER — Telehealth: Payer: Self-pay | Admitting: Medical Oncology

## 2023-02-20 NOTE — Telephone Encounter (Signed)
[employer] 2021-05 - Specimen Collection Study to Evaluate Biomarkers in Subjects with Cancer    Effectiveness of Out-of-Pocket Cost COMmunication and Financial Navigation (CostCOM) in Cancer Patients  Outgoing call: [phone]  Patient referred by MD to the two studies listed above. Call to patient to introduce myself and both studies. Patient was provided with a brief explanation of the studies and all her questions were answered. Inquired with patient if I can email her the two studies to review and should she have questions or interest in participating she is encouraged to call Dr. Al Pimple or myself. Patient informed that I will email her the study consents, for information purposes only.   Patient thanked for her time. Call back number provided with my email.   Rexene Edison, RN, BSN, [facility] Clinical Research Nurse Lead 02/20/2023 3:28 PM

## 2023-02-23 ENCOUNTER — Encounter: Payer: Self-pay | Admitting: Internal Medicine

## 2023-02-23 ENCOUNTER — Ambulatory Visit: Payer: Medicare PPO | Admitting: Internal Medicine

## 2023-02-23 VITALS — BP 120/78 | HR 111 | Ht 65.0 in | Wt 219.2 lb

## 2023-02-23 DIAGNOSIS — E785 Hyperlipidemia, unspecified: Secondary | ICD-10-CM | POA: Diagnosis not present

## 2023-02-23 DIAGNOSIS — Z7985 Long-term (current) use of injectable non-insulin antidiabetic drugs: Secondary | ICD-10-CM | POA: Diagnosis not present

## 2023-02-23 DIAGNOSIS — E669 Obesity, unspecified: Secondary | ICD-10-CM

## 2023-02-23 DIAGNOSIS — E1165 Type 2 diabetes mellitus with hyperglycemia: Secondary | ICD-10-CM | POA: Diagnosis not present

## 2023-02-23 DIAGNOSIS — Z7984 Long term (current) use of oral hypoglycemic drugs: Secondary | ICD-10-CM

## 2023-02-23 LAB — POCT GLYCOSYLATED HEMOGLOBIN (HGB A1C): Hemoglobin A1C: 6.7 % — AB (ref 4.0–5.6)

## 2023-02-23 MED ORDER — FREESTYLE LIBRE 3 SENSOR MISC: 1.0000 | 3 refills | Status: DC

## 2023-02-23 MED ORDER — GLIPIZIDE ER 5 MG PO TB24: 5.0000 mg | ORAL_TABLET | Freq: Every day | ORAL | Status: DC

## 2023-02-23 NOTE — Patient Instructions (Addendum)
Please continue: - Metformin ER 2000 mg after dinner - Mounjaro 12.5 mg weekly  Please add back: - Glipizide ER 5 mg before b'fast   Please return in 4 months with your sugar log

## 2023-02-23 NOTE — Progress Notes (Signed)
Patient ID: Barbara Jackson, female   DOB: 12-07-1955, 67 y.o.   MRN: [id number]   HPI: Barbara Jackson is a 67 y.o.-year-old female, returning for follow-up for DM2, dx in 2007, non-insulin-dependent, now  controlled, without long-term complications.  Last visit 4 months ago.  Interim history: No increased urination, blurry vision, nausea, chest pain.   He continues to see the weight management clinic.  She also sees the clinic psychologist. Since last visit, she was diagnosed with breast cancer stage 3. She will have ChTx 20 weeks, surgery, then RxTx. She will also have a second opinion visit at [facility].  Reviewed HbA1c levels: Lab Results  Component Value Date   HGBA1C 6.4 (A) 09/01/2022   HGBA1C 6.3 (A) 04/28/2022   HGBA1C 6.6 (H) 12/04/2021   HGBA1C 6.4 (A) 02/11/2021   HGBA1C 6.1 (A) 09/19/2020   HGBA1C 6.3 (A) 05/16/2020   HGBA1C 6.1 (A) 08/23/2019   HGBA1C 6.4 (A) 04/25/2019   HGBA1C 6.2 (A) 12/20/2018   HGBA1C 6.5 (A) 08/17/2018  08/13/2021: HbA1c 6.9% 09/19/2020: HbA1c 6.1% 07/25/2020: HbA1c 6.4% 11/25/2019: HbA1c 6.6% 02/22/2018: HbA1c 7.0% 07/2017: HbA1c 7.7% 07/10/2016: HbA1c 7.0%  Pt is on a regimen of: - Metformin ER 2000 mg after dinner - Mounjaro 7.5 >> 10 >> 12.5 mg weekly We stopped Actos 08/17/2018. She tried Januvia, but stopped last year when she was on a new diet at that time and did not want to make any changes in her medicines. We stopped Ozempic when changing to [facility]. She stopped glipizide 07/2022.  Pt checks her sugars  0 to once a day per review of her log: - am:  124-147 >> 125-174, 201 (forgot meds) >> 120-141, 147 >> 127-141 - 2h after b'fast: 112-151, 196 >> 105-175>> 149-160 >> 136, 151 >> 141, 145 - before lunch: 101-145, 163 >> 98-147 >> 115-130 >> 111-128 >> 131-154 - 2h after lunch: 119-197, 234 >> 123-176, 194 >> 114-122, 153 >> 141-172 - before dinner: 107-149, 190 >> 128, 148 >> 83-122, 153 >> 123-142, 189 - 2h after dinner:  141-196 >> 125-200 >> n/c >> 133-141>> 113-133 >> 119-135, 164 - bedtime: 111>> 168, 240 (immed. After dessert) >> 124-179, 287 (1h after dessert) - nighttime: n/c >> 120, 133 >> n/c >> 131 >> n/c Lowest sugar was 111 >> 83>> 119; she has hypoglycemia awareness in the 70s. Highest sugar was 201 >> 240 >> 287  Glucometer: One Touch Ultra 2  In the past, she read Dr. Katherina Right program for reversing diabetes program started to adopt the changes suggested in the log.  Her sugars started to improve significantly.   She also saw Danise Edge with nutrition in the past.  She was being seen in the binge eating clinic at [facility].  However, not recently.  -No CKD, last BUN/creatinine:  Lab Results  Component Value Date   BUN 23 02/18/2023   BUN 17 02/06/2023   CREATININE 0.90 02/18/2023   CREATININE 0.77 02/06/2023  No components found for: "MICROALBCREAT" 02/22/2018: 17/0.88, GFR 65, glucose 136, with the rest of the CMP normal; ACR 6.4 07/10/2016: 20/0.83 On losartan 100.  -+ HL;  last set of lipids: 09/03/2022: 148/177/76/46 Lab Results  Component Value Date   CHOL 174 08/13/2021   HDL 84.60 08/13/2021   LDLDIRECT 33.0 08/13/2021   TRIG 217.0 (H) 08/13/2021   CHOLHDL 2 08/13/2021  07/25/2020: 145/161/83/38 06/12/2019: 170/137/76/71 02/22/2018: 139/109/71/46 07/10/2016: 164/122/84/32  On Lipitor 40.  - last eye exam was 11/24/2022:  No DR; + glaucoma suspect and she also has cataracts [facility], Dr. Nile Riggs).   - no numbness and tingling in her feet.  She sees podiatry-Dr. Charlsie Merles.  Last foot exam 09/01/2022.  Pt has FH of DM in MGM.  She also has depression-on Cymbalta, HTN, frequent UTIs and pelvic floor pain.  Previously on treatment of pain chronically. She saw several specialists but no clear diagnosis was made.  Symptoms resolved ~10/2020. She is on iron -it consistently.  ROS: + see HPI  I reviewed pt's medications, allergies, PMH, social hx, family hx, and changes  were documented in the history of present illness. Otherwise, unchanged from my initial visit note.  Past Medical History:  Diagnosis Date   Abnormal pap 1998   Negative Hpv   Anxiety    Arthritis    [name], chronic    Diabetes mellitus    Dysrhythmia    hx of atrial tachycardia and pacs   Esophageal reflux    GERD (gastroesophageal reflux disease)    H/O hiatal hernia    H/O iron deficiency anemia 08/2016   Hypercholesteremia    Hypertension    IDA (iron deficiency anemia)    Obesity    compulsive overeater   Osteoarthritis    Sleep apnea    Past Surgical History:  Procedure Laterality Date   APPENDECTOMY  1975   TOTAL KNEE ARTHROPLASTY  01/12/2012   Procedure: TOTAL KNEE ARTHROPLASTY;  Surgeon: Loanne Drilling, MD;  Location: [facility];  Service: Orthopedics;  Laterality: Left;   TOTAL KNEE ARTHROPLASTY Right 01/10/2019   Procedure: TOTAL KNEE ARTHROPLASTY;  Surgeon: Ollen Gross, MD;  Location: [facility];  Service: Orthopedics;  Laterality: Right;    Social History   Socioeconomic History   Marital status: Divorced    Spouse name: Not on file   Number of children: 1   Years of education: Not on file   Highest education level: Not on file  Occupational History    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   Lives alone.     Highest level of education:  One year graduate school   Current Outpatient Medications on File Prior to Visit  Medication Sig Dispense Refill   albuterol (VENTOLIN HFA) 108 (90 Base) MCG/ACT inhaler Inhale 2 puffs into the lungs every 6  (six) hours as needed for wheezing or shortness of breath.     atorvastatin (LIPITOR) 20 MG tablet Take 20 mg by mouth daily with supper.   0   Blood Glucose Monitoring Suppl (ONE TOUCH ULTRA 2) w/Device KIT Use to check blood sugar 2-3 times a day 1 each 0   diazepam (VALIUM) 5 MG tablet Take 5-10 mg by mouth every 8 (eight) hours as needed for anxiety.     diphenhydrAMINE (BENADRYL) 25 mg capsule Take 25 mg by mouth every 6 (six) hours as needed for allergies.      fluticasone (FLONASE) 50 MCG/ACT nasal spray Place 2 sprays into both nostrils daily as needed for allergies.  FREESTYLE TEST STRIPS test strip   5   Iron-FA-B Cmp-C-Biot-Probiotic (FUSION PLUS) CAPS Take 1 capsule by mouth daily.     losartan-hydrochlorothiazide (HYZAAR) 100-25 MG per tablet Take 1 tablet by mouth every morning.     metFORMIN (GLUCOPHAGE-XR) 500 MG 24 hr tablet TAKE 4 TABLETS BY MOUTH DAILY WITH SUPPER. 360 tablet 3   omeprazole (PRILOSEC) 40 MG capsule Take 40 mg by mouth daily before breakfast.     potassium chloride (KLOR-CON) 10 MEQ tablet Take 10 mEq by mouth daily.     sertraline (ZOLOFT) 50 MG tablet Take 50 mg by mouth daily with supper.     tirzepatide (MOUNJARO) 12.5 MG/0.5ML Pen INJECT 12.5 MG SUBCUTANEOUSLY ONE TIME PER WEEK 6 mL 3   Vitamin D, Ergocalciferol, (DRISDOL) 1.25 MG (50000 UNIT) CAPS capsule Take 1 capsule (50,000 Units total) by mouth every 7 (seven) days. 12 capsule 0   zolpidem (AMBIEN) 10 MG tablet Take 10 mg by mouth at bedtime as needed for sleep.      No current facility-administered medications on file prior to visit.   Allergies  Allergen Reactions   Diclofenac Other (See Comments)    GAS   Glucotrol [Glipizide] Nausea Only   Lisinopril Cough   Adhesive [Tape] Rash   Family History  Problem Relation Age of Onset   Breast cancer Mother 38   Heart attack Father        Deceased, 29   COPD Father    Prostate cancer Father 75 - 2   Obesity Sister    Hypertension Brother     Healthy Son    Breast cancer Cousin 18       maternal 1st cousin   Colon cancer Neg Hx    Stomach cancer Neg Hx    Esophageal cancer Neg Hx    PE: BP 120/78   Pulse (!) 111   Ht 5\' 5"  (1.651 m)   Wt 219 lb 3.2 oz (99.4 kg)   LMP 06/16/2005   SpO2 99%   BMI 36.48 kg/m  Wt Readings from Last 3 Encounters:  02/23/23 219 lb 3.2 oz (99.4 kg)  02/18/23 221 lb (100.2 kg)  02/05/23 216 lb (98 kg)   Constitutional: overweight, in NAD Eyes: EOMI, no exophthalmos ENT: no thyromegaly, no cervical lymphadenopathy Cardiovascular: tachycardia, RR, No MRG Respiratory: CTA B Musculoskeletal: no deformities Skin: no rashes Neurological: + Very mild tremor with outstretched hands  ASSESSMENT: 1. DM2, non-insulin-dependent, now more controlled, without long-term complications, but with hyperglycemia  2. HL  3. Obesity class II  PLAN:  1. Patient with longstanding, previously uncontrolled type 2 diabetes, with improved control in the last 5 years.  She is on metformin and GLP-1/GIP receptor agonist.  She was previously on sulfonylurea, which she came off.  Her sugars were very well-controlled at last visit improved before meals but she started to check occasionally immediately after she had dessert and she noticed some high blood sugars, up to 240.  We discussed about the optimal time to have dessert (after a meal, and not between meals), before exercise, and not when she is very stressed.  We did not change her medication regimen at that time.  HbA1c was 6.3%, improved. -At today's visit, sugars are at goal or slightly above, with 1 exception in the 280 is 1 hour after eating dessert.  We discussed that with starting chemotherapy, she may expect some higher blood sugars especially if she is started on dexamethasone.  To help with  this and also with the holidays coming up, I suggested to add back glipizide ER a low-dose before breakfast.  Will continue the rest of the regimen. -We also  discussed about possibly using a CGM and she agrees to try this. - I suggested to:  Patient Instructions  Please continue: - Metformin ER 2000 mg after dinner - Mounjaro 12.5 mg weekly  Please add back: - Glipizide ER 5 mg before b'fast   Please return in 4 months with your sugar log  - we checked her HbA1c: 6.7% (higher) - advised to check sugars at different times of the day - 4x a day, rotating check times - advised for yearly eye exams >> she is UTD - return to clinic in 4 months  2. HL -Reviewed latest lipid panel from 07/2021: LDL and HDL at goal, triglycerides elevated: Lab Results  Component Value Date   CHOL 174 08/13/2021   HDL 84.60 08/13/2021   LDLDIRECT 33.0 08/13/2021   TRIG 217.0 (H) 08/13/2021   CHOLHDL 2 08/13/2021  -She is on Lipitor 40 mg daily without side effects  3. Obesity class 2  -She was seen at [facility] for binge eating disorder, but not in the last few years -We changed from Ozempic to [facility] for stronger effect on weight.  She continues on a higher dose, 12.5 mg weekly. -She is also seeing weight management clinic -Before last visit, she lost 15 pounds -Since then, she lost 10 more pounds  Carlus Pavlov, MD PhD [facility]

## 2023-02-24 ENCOUNTER — Other Ambulatory Visit: Payer: Self-pay

## 2023-02-24 ENCOUNTER — Encounter: Payer: Self-pay | Admitting: Hematology and Oncology

## 2023-02-24 ENCOUNTER — Ambulatory Visit: Payer: Medicare PPO | Admitting: Hematology and Oncology

## 2023-02-24 ENCOUNTER — Ambulatory Visit (HOSPITAL_COMMUNITY)
Admission: RE | Admit: 2023-02-24 | Discharge: 2023-02-24 | Disposition: A | Discharge status: Home / Self Care | Payer: Medicare PPO | Source: Ambulatory Visit | Attending: Hematology and Oncology | Admitting: Hematology and Oncology

## 2023-02-24 ENCOUNTER — Inpatient Hospital Stay (HOSPITAL_BASED_OUTPATIENT_CLINIC_OR_DEPARTMENT_OTHER): Payer: Medicare PPO | Admitting: Hematology and Oncology

## 2023-02-24 VITALS — BP 134/86 | HR 96 | Temp 97.5°F | Resp 18 | Ht 65.0 in | Wt 221.6 lb

## 2023-02-24 DIAGNOSIS — R9230 Dense breasts, unspecified: Secondary | ICD-10-CM | POA: Diagnosis not present

## 2023-02-24 DIAGNOSIS — C50411 Malignant neoplasm of upper-outer quadrant of right female breast: Secondary | ICD-10-CM

## 2023-02-24 DIAGNOSIS — Z170 Estrogen receptor positive status [ER+]: Secondary | ICD-10-CM | POA: Diagnosis not present

## 2023-02-24 DIAGNOSIS — E119 Type 2 diabetes mellitus without complications: Secondary | ICD-10-CM | POA: Diagnosis not present

## 2023-02-24 DIAGNOSIS — F411 Generalized anxiety disorder: Secondary | ICD-10-CM | POA: Diagnosis not present

## 2023-02-24 DIAGNOSIS — C773 Secondary and unspecified malignant neoplasm of axilla and upper limb lymph nodes: Secondary | ICD-10-CM | POA: Diagnosis not present

## 2023-02-24 DIAGNOSIS — C50911 Malignant neoplasm of unspecified site of right female breast: Secondary | ICD-10-CM | POA: Diagnosis not present

## 2023-02-24 DIAGNOSIS — I10 Essential (primary) hypertension: Secondary | ICD-10-CM | POA: Diagnosis not present

## 2023-02-24 MED ORDER — GADOBUTROL 1 MMOL/ML IV SOLN
10.0000 mL | Freq: Once | INTRAVENOUS | Status: AC | PRN
  Administered 2023-02-24: 10 mL via INTRAVENOUS

## 2023-02-24 NOTE — Assessment & Plan Note (Signed)
02/09/2023: Palpable mass in the right breast, mammogram 6.2 cm mass UOQ, prominent right axillary and subpectoral lymph nodes, second smaller mass 2.5 cm, biopsy: Grade 3 IDC with LVI, lymph node positive, ER 100%, PR 0%, Ki67 25%, HER2 0  Pathology and radiology counseling: Discussed with the patient, the details of pathology including the type of breast cancer,the clinical staging, the significance of ER, PR and HER-2/neu receptors and the implications for treatment. After reviewing the pathology in detail, we proceeded to discuss the different treatment options between surgery, radiation, chemotherapy, antiestrogen therapies.  Treatment plan: Neoadjuvant chemotherapy with dose dense Adriamycin and Cytoxan followed by Taxol Breast conserving surgery versus mastectomy with targeted node dissection Adjuvant radiation therapy Followed by adjuvant antiestrogen therapy with Verzenio  Chemotherapy Counseling: I discussed the risks and benefits of chemotherapy including the risks of nausea/ vomiting, risk of infection from low WBC count, fatigue due to chemo or anemia, bruising or bleeding due to low platelets, mouth sores, loss/ change in taste and decreased appetite. Liver and kidney function will be monitored through out chemotherapy as abnormalities in liver and kidney function may be a side effect of treatment. Cardiac dysfunction due to Adriamycin and neuropathy risk from Taxol were discussed in detail. Risk of permanent bone marrow dysfunction and leukemia due to chemo were also discussed.  Plan: Port, echo, chemo class, participating in neuropathy clinical trial

## 2023-02-24 NOTE — Progress Notes (Signed)
Patient Care Team: Farris Has, MD as PCP - General (Family Medicine) Tanda Rockers, NP as Nurse Practitioner (Obstetrics and Gynecology) Donnelly Angelica, RN as Oncology Nurse Navigator Lu Duffel, Margretta Ditty, RN as Oncology Nurse Navigator Rachel Moulds, MD as Consulting Physician (Hematology and Oncology) Harriette Bouillon, MD as Consulting Physician (General Surgery) Dorothy Puffer, MD as Consulting Physician (Radiation Oncology)  DIAGNOSIS:  Encounter Diagnosis  Name Primary?   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  Abnormal prominent Barbara axillary lymph nodes.  There is a second smaller irregular high conspicuity enhancing mass posterior and lateral to the dominant mass measuring 2.5 x 1.3 x 1 cm.  Prominent Barbara axillary and possibly subpectoral nodes.   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, estrogen receptor positive (HCC)   02/18/2023 Cancer Staging   Staging form: Breast, AJCC 8th Edition - Clinical stage from 02/18/2023: Stage IIIB (cT3, cN2, cM0, G3, ER+, PR-, HER2-) - Signed by Rachel Moulds, MD on 02/18/2023 Stage prefix: Initial diagnosis Histologic grading system: 3 grade system Laterality: Barbara Staged by: Pathologist and managing  physician Stage used in treatment planning: Yes National guidelines used in treatment planning: Yes Type of national guideline used in treatment planning: NCCN   03/06/2023 -  Chemotherapy   Patient is on Treatment Plan : BREAST ADJUVANT DOSE DENSE AC q14d / PACLitaxel q7d       CHIEF COMPLIANT: Follow-up for treatment discussion  INTERVAL HISTORY: Barbara Jackson is a 67 y.o. female is here because of recent diagnosis of Barbara Jackson she was seen by multidisciplinary clinic and the recommendation was to initiate neoadjuvant chemotherapy followed by surgery radiation and antiestrogen therapy.  Patient wanted to come to me for another opinion and to discuss several questions that she had after that meeting.  She is also getting a third opinion at [facility].   ALLERGIES:  is allergic to diclofenac, glucotrol [glipizide], lisinopril, and adhesive [tape].  MEDICATIONS:  Current Outpatient Medications  Medication Sig Dispense Refill   albuterol (VENTOLIN HFA) 108 (90 Base) MCG/ACT inhaler Inhale 2 puffs into the lungs every 6 (six) hours as needed for wheezing or shortness of breath.     atorvastatin (LIPITOR) 20 MG tablet Take 20 mg by mouth daily with supper.   0   Blood Glucose Monitoring Suppl (ONE TOUCH ULTRA 2) w/Device KIT Use to check blood sugar 2-3 times a day 1 each 0   Continuous Glucose Sensor (FREESTYLE LIBRE 3 SENSOR) MISC 1 each by Does not apply route every 14 (fourteen) days. 6 each 3   diazepam (VALIUM) 5 MG tablet Take 5-10 mg by mouth every 8 (eight) hours as needed for anxiety.     diphenhydrAMINE (BENADRYL) 25 mg capsule Take 25 mg by mouth every 6 (six) hours as  needed for allergies.      fluticasone (FLONASE) 50 MCG/ACT nasal spray Place 2 sprays into both nostrils daily as needed for allergies.     FREESTYLE TEST STRIPS test strip   5   glipiZIDE (GLUCOTROL XL) 5 MG 24 hr tablet Take 1 tablet (5 mg total) by mouth daily with breakfast.      Iron-FA-B Cmp-C-Biot-Probiotic (FUSION PLUS) CAPS Take 1 capsule by mouth daily.     losartan-hydrochlorothiazide (HYZAAR) 100-25 MG per tablet Take 1 tablet by mouth every morning.     metFORMIN (GLUCOPHAGE-XR) 500 MG 24 hr tablet TAKE 4 TABLETS BY MOUTH DAILY WITH SUPPER. 360 tablet 3   omeprazole (PRILOSEC) 40 MG capsule Take 40 mg by mouth daily before breakfast.     potassium chloride (KLOR-CON) 10 MEQ tablet Take 10 mEq by mouth daily.     sertraline (ZOLOFT) 50 MG tablet Take 50 mg by mouth daily with supper.     tirzepatide (MOUNJARO) 12.5 MG/0.5ML Pen INJECT 12.5 MG SUBCUTANEOUSLY ONE TIME PER WEEK 6 mL 3   Vitamin D, Ergocalciferol, (DRISDOL) 1.25 MG (50000 UNIT) CAPS capsule Take 1 capsule (50,000 Units total) by mouth every 7 (seven) days. 12 capsule 0   zolpidem (AMBIEN) 10 MG tablet Take 10 mg by mouth at bedtime as needed for sleep.      No current facility-administered medications for this visit.    PHYSICAL EXAMINATION: ECOG PERFORMANCE STATUS: 1 - Symptomatic but completely ambulatory  Vitals:   02/24/23 1318  BP: 134/86  Pulse: 96  Resp: 18  Temp: (!) 97.5 F (36.4 C)  SpO2: 98%   Filed Weights   02/24/23 1318  Weight: 221 lb 9.6 oz (100.5 kg)      LABORATORY DATA:  I have reviewed the data as listed    Latest Ref Rng & Units 02/18/2023    8:44 AM 02/06/2023    8:35 AM 12/31/2022    1:24 PM  CMP  Glucose 70 - 99 mg/dL 782  956  213   BUN 8 - 23 mg/dL 23  17  19    Creatinine 0.44 - 1.00 mg/dL 0.86  5.78  4.69   Sodium 135 - 145 mmol/L 139  137  139   Potassium 3.5 - 5.1 mmol/L 3.6  3.8  3.7   Chloride 98 - 111 mmol/L 101  102  97   CO2 22 - 32 mmol/L 27  23  24    Calcium 8.9 - 10.3 mg/dL 9.5  9.2  9.6   Total Protein 6.5 - 8.1 g/dL 7.2   7.1   Total Bilirubin 0.3 - 1.2 mg/dL 0.7   0.5   Alkaline Phos 38 - 126 U/L 50   64   AST 15 - 41 U/L 14   19   ALT 0 - 44 U/L 15   20     Lab Results  Component Value Date   WBC 8.9 02/18/2023   HGB 14.2  02/18/2023   HCT 42.1 02/18/2023   MCV 84.2 02/18/2023   PLT 446 (H) 02/18/2023   NEUTROABS 5.2 02/18/2023    ASSESSMENT & PLAN:  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 After reviewing the pathology in detail, we proceeded to discuss the different treatment options between surgery, radiation, chemotherapy, antiestrogen therapies.  Treatment plan: Neoadjuvant chemotherapy with dose dense Adriamycin and Cytoxan followed by Taxol Breast conserving surgery versus mastectomy with targeted node dissection Adjuvant radiation therapy Followed by adjuvant antiestrogen therapy with Verzenio  Chemotherapy Counseling: I discussed the risks and benefits of chemotherapy including the risks of nausea/ vomiting, risk of infection from low WBC count, fatigue due to chemo or anemia, bruising or bleeding due to low platelets, mouth sores, loss/ change in taste and decreased appetite. Liver and kidney function will be monitored through out chemotherapy as abnormalities in liver and kidney function may be a side effect of treatment. Cardiac dysfunction due to Adriamycin and neuropathy risk from Taxol were discussed in detail. Risk of permanent bone marrow dysfunction and leukemia due to chemo were also discussed.  Plan: Port, echo, chemo class, participating in neuropathy clinical trial  Patient is getting a second opinion at [facility] and will inform us of her decision once she  has that opinion.  I discussed with her that if there was a clinical trial that she would be eligible to I would strongly support that she participate in the trial. She had multiple questions about risk of distant recurrence and relative benefits of chemotherapy and endocrine therapy. I discussed with her that Oncotype DX is not being requested because the tumor size was greater than 5 cm.  No orders of the defined types were placed in this encounter.  The patient has a good understanding of the overall plan. she agrees with it. she will call with any problems that may develop before the next visit here. Total time spent: 45 mins including face to face time and time spent for planning, charting and co-ordination of care   Tamsen Meek, MD 02/24/23    I Janan Ridge am acting as a Neurosurgeon for [employer]  I have reviewed the above documentation for accuracy and completeness, and I agree with the above.

## 2023-02-25 ENCOUNTER — Ambulatory Visit (HOSPITAL_COMMUNITY)
Admission: RE | Admit: 2023-02-25 | Discharge: 2023-02-25 | Disposition: A | Discharge status: Home / Self Care | Payer: Medicare PPO | Source: Ambulatory Visit | Attending: Hematology and Oncology

## 2023-02-25 ENCOUNTER — Other Ambulatory Visit: Payer: Self-pay

## 2023-02-25 ENCOUNTER — Encounter: Payer: Self-pay | Admitting: *Deleted

## 2023-02-25 DIAGNOSIS — Z170 Estrogen receptor positive status [ER+]: Secondary | ICD-10-CM | POA: Insufficient documentation

## 2023-02-25 DIAGNOSIS — Z0189 Encounter for other specified special examinations: Secondary | ICD-10-CM | POA: Diagnosis not present

## 2023-02-25 DIAGNOSIS — E119 Type 2 diabetes mellitus without complications: Secondary | ICD-10-CM | POA: Diagnosis not present

## 2023-02-25 DIAGNOSIS — I10 Essential (primary) hypertension: Secondary | ICD-10-CM | POA: Insufficient documentation

## 2023-02-25 DIAGNOSIS — C50411 Malignant neoplasm of upper-outer quadrant of right female breast: Secondary | ICD-10-CM | POA: Insufficient documentation

## 2023-02-25 LAB — ECHOCARDIOGRAM COMPLETE
AR max vel: 2.59 cm2
AV Area VTI: 3.05 cm2
AV Area mean vel: 2.95 cm2
AV Mean grad: 4 mmHg
AV Peak grad: 7.6 mmHg
Ao pk vel: 1.38 m/s
Area-P 1/2: 2.38 cm2
S' Lateral: 2.2 cm

## 2023-02-26 ENCOUNTER — Encounter: Payer: Self-pay | Admitting: *Deleted

## 2023-02-27 ENCOUNTER — Telehealth: Payer: Self-pay | Admitting: *Deleted

## 2023-02-27 ENCOUNTER — Telehealth: Payer: Self-pay | Admitting: Medical Oncology

## 2023-02-27 ENCOUNTER — Other Ambulatory Visit: Payer: Self-pay

## 2023-02-27 ENCOUNTER — Encounter (HOSPITAL_BASED_OUTPATIENT_CLINIC_OR_DEPARTMENT_OTHER): Payer: Self-pay | Admitting: Surgery

## 2023-02-27 ENCOUNTER — Telehealth: Payer: Self-pay | Admitting: Hematology and Oncology

## 2023-02-27 NOTE — Telephone Encounter (Signed)
Spoke with patient regarding her schedule.  She is still thinking about a clinical trial at [facility] but hasn't made a decision about that yet.  She would like to leave all her appts here as is for now.  If she doesn't do the trial at [facility] she tells me she may want to push her chemo start date out some. She will inform us of her decision.

## 2023-02-27 NOTE — Progress Notes (Signed)
Pharmacist Chemotherapy Monitoring - Initial Assessment    Anticipated start date: 03/06/23   The following has been reviewed per standard work regarding the patient's treatment regimen: The patient's diagnosis, treatment plan and drug doses, and organ/hematologic function Lab orders and baseline tests specific to treatment regimen  The treatment plan start date, drug sequencing, and pre-medications Prior authorization status  Patient's documented medication list, including drug-drug interaction screen and prescriptions for anti-emetics and supportive care specific to the treatment regimen The drug concentrations, fluid compatibility, administration routes, and timing of the medications to be used The patient's access for treatment and lifetime cumulative dose history, if applicable  The patient's medication allergies and previous infusion related reactions, if applicable   Changes made to treatment plan:  N/A  Follow up needed:  Home antiemetics   Janeice Robinson, RPH, 02/27/2023  4:25 PM

## 2023-02-27 NOTE — Telephone Encounter (Signed)
[employer] 2021-05 - Specimen Collection Study to Evaluate Biomarkers in Subjects with Cancer    [id number] Cost communication and financial navigation in cancer patients  Call to patient regarding the above studies, to see if she had any questions from the study information sent to her. Patient denied having any questions, but does state that she currently feels overwhelmed and does not wish to participate in either studies.  Patient was thanked for her time.   Rexene Edison, RN, BSN, [facility] Clinical Research Nurse Lead 02/27/2023 11:30 AM

## 2023-02-27 NOTE — Progress Notes (Signed)
   02/27/23 1119  PAT Phone Screen  Is the patient taking a GLP-1 receptor agonist? (S)  Yes (Mounjaro LD 02-15-23)  Has the patient been informed on holding medication? Yes  Do You Have Diabetes? Yes  Do You Have Hypertension? Yes  Have You Ever Been to the ER for Asthma? No  Have You Taken Oral Steroids in the Past 3 Months? No  Do you Take Phenteramine or any Other Diet Drugs? No  Recent  Lab Work, EKG, CXR? Yes  Where was this test performed? 02-18-23 CBC/diff, CMP at [facility]  Do you have a history of heart problems? (S)  No (02-25-23 ECHO EF 65-70%)  Any Recent Hospitalizations? No  Height 5\' 5"  (1.651 m)  Weight 100.5 kg  Pat Appointment Scheduled (S)  Yes (EKG)

## 2023-03-02 ENCOUNTER — Ambulatory Visit (HOSPITAL_COMMUNITY)
Admission: RE | Admit: 2023-03-02 | Discharge: 2023-03-02 | Disposition: A | Discharge status: Home / Self Care | Payer: Medicare PPO | Source: Ambulatory Visit | Attending: Hematology and Oncology | Admitting: Hematology and Oncology

## 2023-03-02 ENCOUNTER — Other Ambulatory Visit: Payer: Medicare PPO

## 2023-03-02 ENCOUNTER — Other Ambulatory Visit: Payer: Self-pay | Admitting: Hematology and Oncology

## 2023-03-02 ENCOUNTER — Encounter: Payer: Self-pay | Admitting: Genetic Counselor

## 2023-03-02 ENCOUNTER — Encounter (HOSPITAL_COMMUNITY)
Admission: RE | Admit: 2023-03-02 | Discharge: 2023-03-02 | Disposition: A | Discharge status: Home / Self Care | Payer: Medicare PPO | Source: Ambulatory Visit | Attending: Hematology and Oncology | Admitting: Hematology and Oncology

## 2023-03-02 ENCOUNTER — Telehealth: Payer: Self-pay | Admitting: Genetic Counselor

## 2023-03-02 DIAGNOSIS — R590 Localized enlarged lymph nodes: Secondary | ICD-10-CM | POA: Diagnosis not present

## 2023-03-02 DIAGNOSIS — K449 Diaphragmatic hernia without obstruction or gangrene: Secondary | ICD-10-CM | POA: Diagnosis not present

## 2023-03-02 DIAGNOSIS — C50919 Malignant neoplasm of unspecified site of unspecified female breast: Secondary | ICD-10-CM | POA: Diagnosis not present

## 2023-03-02 DIAGNOSIS — C50911 Malignant neoplasm of unspecified site of right female breast: Secondary | ICD-10-CM | POA: Diagnosis not present

## 2023-03-02 DIAGNOSIS — C50411 Malignant neoplasm of upper-outer quadrant of right female breast: Secondary | ICD-10-CM | POA: Diagnosis not present

## 2023-03-02 DIAGNOSIS — Z170 Estrogen receptor positive status [ER+]: Secondary | ICD-10-CM | POA: Diagnosis not present

## 2023-03-02 DIAGNOSIS — Z1379 Encounter for other screening for genetic and chromosomal anomalies: Secondary | ICD-10-CM | POA: Insufficient documentation

## 2023-03-02 MED ORDER — TECHNETIUM TC 99M MEDRONATE IV KIT
20.0000 | PACK | Freq: Once | INTRAVENOUS | Status: AC | PRN
  Administered 2023-03-02: 21.4 via INTRAVENOUS

## 2023-03-02 MED ORDER — SODIUM CHLORIDE (PF) 0.9 % IJ SOLN
INTRAMUSCULAR | Status: AC
  Filled 2023-03-02: qty 50

## 2023-03-02 MED ORDER — IOHEXOL 300 MG/ML  SOLN
100.0000 mL | Freq: Once | INTRAMUSCULAR | Status: AC | PRN
  Administered 2023-03-02: 100 mL via INTRAVENOUS

## 2023-03-02 NOTE — Telephone Encounter (Addendum)
I contacted Ms. Wyble to discuss her genetic testing results. No pathogenic variants were identified in the 34 genes analyzed. Detailed clinic note to follow.  The test report has been scanned into EPIC and is located under the Molecular Pathology section of the Results Review tab.  A portion of the result report is included below for reference.   Lalla Brothers, MS, [facility] Genetic Counselor [email or web address] (P) [phone]

## 2023-03-03 ENCOUNTER — Encounter (HOSPITAL_BASED_OUTPATIENT_CLINIC_OR_DEPARTMENT_OTHER)
Admission: RE | Admit: 2023-03-03 | Discharge: 2023-03-03 | Disposition: A | Discharge status: Home / Self Care | Payer: Medicare PPO | Source: Ambulatory Visit | Attending: Surgery

## 2023-03-03 DIAGNOSIS — Z01812 Encounter for preprocedural laboratory examination: Secondary | ICD-10-CM | POA: Diagnosis not present

## 2023-03-03 MED ORDER — CHLORHEXIDINE GLUCONATE CLOTH 2 % EX PADS: 6.0000 | MEDICATED_PAD | Freq: Once | CUTANEOUS | Status: DC

## 2023-03-04 ENCOUNTER — Ambulatory Visit: Payer: Medicare PPO | Admitting: Physician Assistant

## 2023-03-04 ENCOUNTER — Other Ambulatory Visit: Payer: Medicare PPO

## 2023-03-04 ENCOUNTER — Ambulatory Visit: Payer: Medicare PPO

## 2023-03-05 ENCOUNTER — Other Ambulatory Visit: Payer: Medicare PPO

## 2023-03-05 ENCOUNTER — Ambulatory Visit: Payer: Medicare PPO | Admitting: Pharmacist

## 2023-03-05 ENCOUNTER — Encounter (HOSPITAL_BASED_OUTPATIENT_CLINIC_OR_DEPARTMENT_OTHER)
Admission: RE | Disposition: A | Discharge status: Home / Self Care | Payer: Self-pay | Source: Home / Self Care | Attending: Surgery

## 2023-03-05 ENCOUNTER — Ambulatory Visit (HOSPITAL_COMMUNITY): Payer: Medicare PPO

## 2023-03-05 ENCOUNTER — Ambulatory Visit (HOSPITAL_BASED_OUTPATIENT_CLINIC_OR_DEPARTMENT_OTHER): Payer: Medicare PPO | Admitting: Anesthesiology

## 2023-03-05 ENCOUNTER — Ambulatory Visit: Payer: Self-pay | Admitting: Genetic Counselor

## 2023-03-05 ENCOUNTER — Other Ambulatory Visit: Payer: Self-pay

## 2023-03-05 ENCOUNTER — Encounter (HOSPITAL_BASED_OUTPATIENT_CLINIC_OR_DEPARTMENT_OTHER): Payer: Self-pay | Admitting: Surgery

## 2023-03-05 ENCOUNTER — Ambulatory Visit (HOSPITAL_BASED_OUTPATIENT_CLINIC_OR_DEPARTMENT_OTHER)
Admission: RE | Admit: 2023-03-05 | Discharge: 2023-03-05 | Disposition: A | Discharge status: Home / Self Care | Payer: Medicare PPO | Attending: Surgery | Admitting: Surgery

## 2023-03-05 DIAGNOSIS — K219 Gastro-esophageal reflux disease without esophagitis: Secondary | ICD-10-CM | POA: Diagnosis not present

## 2023-03-05 DIAGNOSIS — Z96653 Presence of artificial knee joint, bilateral: Secondary | ICD-10-CM | POA: Insufficient documentation

## 2023-03-05 DIAGNOSIS — Z452 Encounter for adjustment and management of vascular access device: Secondary | ICD-10-CM | POA: Insufficient documentation

## 2023-03-05 DIAGNOSIS — Z1379 Encounter for other screening for genetic and chromosomal anomalies: Secondary | ICD-10-CM

## 2023-03-05 DIAGNOSIS — I10 Essential (primary) hypertension: Secondary | ICD-10-CM | POA: Diagnosis not present

## 2023-03-05 DIAGNOSIS — R918 Other nonspecific abnormal finding of lung field: Secondary | ICD-10-CM | POA: Diagnosis not present

## 2023-03-05 DIAGNOSIS — Z803 Family history of malignant neoplasm of breast: Secondary | ICD-10-CM | POA: Diagnosis not present

## 2023-03-05 DIAGNOSIS — Z7984 Long term (current) use of oral hypoglycemic drugs: Secondary | ICD-10-CM | POA: Insufficient documentation

## 2023-03-05 DIAGNOSIS — E1165 Type 2 diabetes mellitus with hyperglycemia: Secondary | ICD-10-CM | POA: Diagnosis not present

## 2023-03-05 DIAGNOSIS — Z170 Estrogen receptor positive status [ER+]: Secondary | ICD-10-CM | POA: Insufficient documentation

## 2023-03-05 DIAGNOSIS — C50411 Malignant neoplasm of upper-outer quadrant of right female breast: Secondary | ICD-10-CM | POA: Insufficient documentation

## 2023-03-05 DIAGNOSIS — Z7985 Long-term (current) use of injectable non-insulin antidiabetic drugs: Secondary | ICD-10-CM | POA: Diagnosis not present

## 2023-03-05 DIAGNOSIS — E1169 Type 2 diabetes mellitus with other specified complication: Secondary | ICD-10-CM

## 2023-03-05 DIAGNOSIS — G4730 Sleep apnea, unspecified: Secondary | ICD-10-CM | POA: Insufficient documentation

## 2023-03-05 DIAGNOSIS — C50911 Malignant neoplasm of unspecified site of right female breast: Secondary | ICD-10-CM | POA: Diagnosis not present

## 2023-03-05 DIAGNOSIS — K449 Diaphragmatic hernia without obstruction or gangrene: Secondary | ICD-10-CM | POA: Diagnosis not present

## 2023-03-05 DIAGNOSIS — Z853 Personal history of malignant neoplasm of breast: Secondary | ICD-10-CM | POA: Diagnosis not present

## 2023-03-05 HISTORY — PX: PORTACATH PLACEMENT: SHX2246

## 2023-03-05 HISTORY — DX: Malignant (primary) neoplasm, unspecified: C80.1

## 2023-03-05 LAB — GLUCOSE, CAPILLARY
Glucose-Capillary: 104 mg/dL — ABNORMAL HIGH (ref 70–99)
Glucose-Capillary: 82 mg/dL (ref 70–99)

## 2023-03-05 SURGERY — INSERTION, TUNNELED CENTRAL VENOUS DEVICE, WITH PORT
Anesthesia: General | Site: Chest | Laterality: Right

## 2023-03-05 MED ORDER — FENTANYL CITRATE (PF) 100 MCG/2ML IJ SOLN
INTRAMUSCULAR | Status: DC | PRN
  Administered 2023-03-05 (×2): 100 ug via INTRAVENOUS

## 2023-03-05 MED ORDER — OXYCODONE HCL 5 MG/5ML PO SOLN: 5.0000 mg | Freq: Once | ORAL | Status: DC | PRN

## 2023-03-05 MED ORDER — LIDOCAINE HCL (CARDIAC) PF 100 MG/5ML IV SOSY
PREFILLED_SYRINGE | INTRAVENOUS | Status: DC | PRN
  Administered 2023-03-05: 80 mg via INTRAVENOUS

## 2023-03-05 MED ORDER — OXYCODONE HCL 5 MG PO TABS: 5.0000 mg | ORAL_TABLET | Freq: Four times a day (QID) | ORAL | 0 refills | Status: DC | PRN

## 2023-03-05 MED ORDER — FENTANYL CITRATE (PF) 100 MCG/2ML IJ SOLN
INTRAMUSCULAR | Status: AC
  Filled 2023-03-05: qty 2

## 2023-03-05 MED ORDER — BUPIVACAINE-EPINEPHRINE 0.25% -1:200000 IJ SOLN
INTRAMUSCULAR | Status: DC | PRN
  Administered 2023-03-05: 10 mL

## 2023-03-05 MED ORDER — OXYCODONE HCL 5 MG PO TABS: 5.0000 mg | ORAL_TABLET | Freq: Once | ORAL | Status: DC | PRN

## 2023-03-05 MED ORDER — HEPARIN SOD (PORK) LOCK FLUSH 100 UNIT/ML IV SOLN
INTRAVENOUS | Status: AC
  Filled 2023-03-05: qty 5

## 2023-03-05 MED ORDER — HEPARIN (PORCINE) IN NACL 1000-0.9 UT/500ML-% IV SOLN
INTRAVENOUS | Status: AC
  Filled 2023-03-05: qty 500

## 2023-03-05 MED ORDER — HEPARIN SOD (PORK) LOCK FLUSH 100 UNIT/ML IV SOLN
INTRAVENOUS | Status: DC | PRN
  Administered 2023-03-05: 500 [IU] via INTRAVENOUS

## 2023-03-05 MED ORDER — LIDOCAINE 2% (20 MG/ML) 5 ML SYRINGE
INTRAMUSCULAR | Status: AC
  Filled 2023-03-05: qty 5

## 2023-03-05 MED ORDER — EPHEDRINE SULFATE (PRESSORS) 50 MG/ML IJ SOLN
INTRAMUSCULAR | Status: DC | PRN
  Administered 2023-03-05: 10 mg via INTRAVENOUS

## 2023-03-05 MED ORDER — MIDAZOLAM HCL 2 MG/2ML IJ SOLN
INTRAMUSCULAR | Status: AC
  Filled 2023-03-05: qty 2

## 2023-03-05 MED ORDER — ACETAMINOPHEN 500 MG PO TABS
ORAL_TABLET | ORAL | Status: AC
  Filled 2023-03-05: qty 2

## 2023-03-05 MED ORDER — ACETAMINOPHEN 500 MG PO TABS: 1000.0000 mg | ORAL_TABLET | ORAL | Status: DC

## 2023-03-05 MED ORDER — ACETAMINOPHEN 500 MG PO TABS
1000.0000 mg | ORAL_TABLET | Freq: Once | ORAL | Status: AC
  Administered 2023-03-05: 1000 mg via ORAL

## 2023-03-05 MED ORDER — LACTATED RINGERS IV SOLN: INTRAVENOUS | Status: DC

## 2023-03-05 MED ORDER — CEFAZOLIN IN SODIUM CHLORIDE 3-0.9 GM/100ML-% IV SOLN: 3.0000 g | INTRAVENOUS | Status: DC

## 2023-03-05 MED ORDER — AMISULPRIDE (ANTIEMETIC) 5 MG/2ML IV SOLN: 10.0000 mg | Freq: Once | INTRAVENOUS | Status: DC | PRN

## 2023-03-05 MED ORDER — GABAPENTIN 300 MG PO CAPS
ORAL_CAPSULE | ORAL | Status: AC
  Filled 2023-03-05: qty 1

## 2023-03-05 MED ORDER — CEFAZOLIN SODIUM-DEXTROSE 2-4 GM/100ML-% IV SOLN
INTRAVENOUS | Status: AC
  Filled 2023-03-05: qty 100

## 2023-03-05 MED ORDER — PROPOFOL 10 MG/ML IV BOLUS
INTRAVENOUS | Status: DC | PRN
  Administered 2023-03-05: 200 mg via INTRAVENOUS

## 2023-03-05 MED ORDER — ONDANSETRON HCL 4 MG/2ML IJ SOLN: 4.0000 mg | Freq: Once | INTRAMUSCULAR | Status: DC | PRN

## 2023-03-05 MED ORDER — HYDROMORPHONE HCL 1 MG/ML IJ SOLN: 0.2500 mg | INTRAMUSCULAR | Status: DC | PRN

## 2023-03-05 MED ORDER — GABAPENTIN 300 MG PO CAPS
300.0000 mg | ORAL_CAPSULE | ORAL | Status: AC
  Administered 2023-03-05: 300 mg via ORAL

## 2023-03-05 MED ORDER — DEXAMETHASONE SODIUM PHOSPHATE 4 MG/ML IJ SOLN
INTRAMUSCULAR | Status: DC | PRN
  Administered 2023-03-05: 5 mg via INTRAVENOUS

## 2023-03-05 MED ORDER — ONDANSETRON HCL 4 MG/2ML IJ SOLN
INTRAMUSCULAR | Status: DC | PRN
  Administered 2023-03-05: 4 mg via INTRAVENOUS

## 2023-03-05 MED ORDER — HEPARIN (PORCINE) IN NACL 2-0.9 UNITS/ML
INTRAMUSCULAR | Status: AC | PRN
  Administered 2023-03-05: 1 via INTRAVENOUS

## 2023-03-05 MED ORDER — MIDAZOLAM HCL 5 MG/5ML IJ SOLN
INTRAMUSCULAR | Status: DC | PRN
  Administered 2023-03-05: 2 mg via INTRAVENOUS

## 2023-03-05 MED ORDER — PROPOFOL 10 MG/ML IV BOLUS
INTRAVENOUS | Status: AC
  Filled 2023-03-05: qty 20

## 2023-03-05 SURGICAL SUPPLY — 59 items
ADH SKN CLS APL DERMABOND .7 (GAUZE/BANDAGES/DRESSINGS) ×1
APL PRP STRL LF DISP 70% ISPRP (MISCELLANEOUS) ×1
APL SKNCLS STERI-STRIP NONHPOA (GAUZE/BANDAGES/DRESSINGS)
BAG DECANTER FOR FLEXI CONT (MISCELLANEOUS) ×2 IMPLANT
BENZOIN TINCTURE PRP APPL 2/3 (GAUZE/BANDAGES/DRESSINGS) IMPLANT
BLADE HEX COATED 2.75 (ELECTRODE) ×2 IMPLANT
BLADE SURG 11 STRL SS (BLADE) ×2 IMPLANT
BLADE SURG 15 STRL LF DISP TIS (BLADE) ×2 IMPLANT
BLADE SURG 15 STRL SS (BLADE) ×1
CANISTER SUCT 1200ML W/VALVE (MISCELLANEOUS) IMPLANT
CHLORAPREP W/TINT 26 (MISCELLANEOUS) ×2 IMPLANT
COVER BACK TABLE 60X90IN (DRAPES) ×2 IMPLANT
COVER MAYO STAND STRL (DRAPES) ×2 IMPLANT
COVER PROBE 5X48 (MISCELLANEOUS) ×1
DERMABOND ADVANCED .7 DNX12 (GAUZE/BANDAGES/DRESSINGS) ×2 IMPLANT
DRAPE C-ARM 42X72 X-RAY (DRAPES) ×2 IMPLANT
DRAPE LAPAROSCOPIC ABDOMINAL (DRAPES) ×2 IMPLANT
DRAPE UTILITY XL STRL (DRAPES) ×2 IMPLANT
DRSG TEGADERM 2-3/8X2-3/4 SM (GAUZE/BANDAGES/DRESSINGS) IMPLANT
DRSG TEGADERM 4X4.75 (GAUZE/BANDAGES/DRESSINGS) IMPLANT
ELECT REM PT RETURN 9FT ADLT (ELECTROSURGICAL) ×1
ELECTRODE REM PT RTRN 9FT ADLT (ELECTROSURGICAL) ×2 IMPLANT
GAUZE 4X4 16PLY ~~LOC~~+RFID DBL (SPONGE) ×2 IMPLANT
GAUZE SPONGE 4X4 12PLY STRL LF (GAUZE/BANDAGES/DRESSINGS) IMPLANT
GLOVE BIOGEL PI IND STRL 8 (GLOVE) ×2 IMPLANT
GLOVE ECLIPSE 8.0 STRL XLNG CF (GLOVE) ×2 IMPLANT
GOWN STRL REUS W/ TWL LRG LVL3 (GOWN DISPOSABLE) ×4 IMPLANT
GOWN STRL REUS W/ TWL XL LVL3 (GOWN DISPOSABLE) ×2 IMPLANT
GOWN STRL REUS W/TWL LRG LVL3 (GOWN DISPOSABLE) ×2
GOWN STRL REUS W/TWL XL LVL3 (GOWN DISPOSABLE) ×1
IV KIT MINILOC 20X1 SAFETY (NEEDLE) IMPLANT
KIT CVR 48X5XPRB PLUP LF (MISCELLANEOUS) ×2 IMPLANT
KIT PORT INFUSION SMART 8FR (Port) IMPLANT
NDL HYPO 22X1.5 SAFETY MO (MISCELLANEOUS) IMPLANT
NDL HYPO 25X1 1.5 SAFETY (NEEDLE) ×2 IMPLANT
NDL SAFETY ECLIP 18X1.5 (MISCELLANEOUS) IMPLANT
NDL SPNL 22GX3.5 QUINCKE BK (NEEDLE) IMPLANT
NEEDLE HYPO 22X1.5 SAFETY MO (MISCELLANEOUS)
NEEDLE HYPO 25X1 1.5 SAFETY (NEEDLE) ×1
NEEDLE SPNL 22GX3.5 QUINCKE BK (NEEDLE)
PACK BASIN DAY SURGERY FS (CUSTOM PROCEDURE TRAY) ×2 IMPLANT
PENCIL SMOKE EVACUATOR (MISCELLANEOUS) ×2 IMPLANT
SET SHEATH INTRODUCER 10FR (MISCELLANEOUS) IMPLANT
SHEATH COOK PEEL AWAY SET 9F (SHEATH) IMPLANT
SLEEVE SCD COMPRESS KNEE MED (STOCKING) ×2 IMPLANT
SPIKE FLUID TRANSFER (MISCELLANEOUS) IMPLANT
SPONGE T-LAP 4X18 ~~LOC~~+RFID (SPONGE) IMPLANT
STRIP CLOSURE SKIN 1/2X4 (GAUZE/BANDAGES/DRESSINGS) IMPLANT
SUT MON AB 4-0 PC3 18 (SUTURE) ×2 IMPLANT
SUT PROLENE 2 0 CT2 30 (SUTURE) IMPLANT
SUT PROLENE 2 0 SH DA (SUTURE) ×2 IMPLANT
SUT SILK 2 0 TIES 17X18 (SUTURE)
SUT SILK 2-0 18XBRD TIE BLK (SUTURE) IMPLANT
SUT VICRYL 3-0 CR8 SH (SUTURE) ×2 IMPLANT
SYR 5ML LUER SLIP (SYRINGE) ×2 IMPLANT
SYR CONTROL 10ML LL (SYRINGE) ×2 IMPLANT
TOWEL GREEN STERILE FF (TOWEL DISPOSABLE) ×4 IMPLANT
TUBE CONNECTING 20X1/4 (TUBING) IMPLANT
YANKAUER SUCT BULB TIP NO VENT (SUCTIONS) IMPLANT

## 2023-03-05 NOTE — Discharge Instructions (Addendum)
PORT-A-CATH: POST OP INSTRUCTIONS  Always review your discharge instruction sheet given to you by the facility where your surgery was performed.   A prescription for pain medication may be given to you upon discharge. Take your pain medication as prescribed, if needed. If narcotic pain medicine is not needed, then you make take acetaminophen (Tylenol) or ibuprofen (Advil) as needed.  Take your usually prescribed medications unless otherwise directed. If you need a refill on your pain medication, please contact our office. All narcotic pain medicine now requires a paper prescription.  Phoned in and fax refills are no longer allowed by law.  Prescriptions will not be filled after 5 pm or on weekends.  You should follow a light diet for the remainder of the day after your procedure. Most patients will experience some mild swelling and/or bruising in the area of the incision. It may take several days to resolve. It is common to experience some constipation if taking pain medication after surgery. Increasing fluid intake and taking a stool softener (such as Colace) will usually help or prevent this problem from occurring. A mild laxative (Milk of Magnesia or Miralax) should be taken according to package directions if there are no bowel movements after 48 hours.  Unless discharge instructions indicate otherwise, you may remove your bandages 48 hours after surgery, and you may shower at that time. You may have steri-strips (small white skin tapes) in place directly over the incision.  These strips should be left on the skin for 7-10 days.  If your surgeon used Dermabond (skin glue) on the incision, you may shower in 24 hours.  The glue will flake off over the next 2-3 weeks.  If your port is left accessed at the end of surgery (needle left in port), the dressing cannot get wet and should only by changed by a healthcare professional. When the port is no longer accessed (when the needle has been removed),  follow step 7.   ACTIVITIES:  Limit activity involving your arms for the next 72 hours. Do no strenuous exercise or activity for 1 week. You may drive when you are no longer taking prescription pain medication, you can comfortably wear a seatbelt, and you can maneuver your car. 10.You may need to see your doctor in the office for a follow-up appointment.  Please       check with your doctor.  11.When you receive a new Port-a-Cath, you will get a product guide and        ID card.  Please keep them in case you need them.  WHEN TO CALL YOUR DOCTOR [phone]): Fever over 101.0 Chills Continued bleeding from incision Increased redness and tenderness at the site Shortness of breath, difficulty breathing   The clinic staff is available to answer your questions during regular business hours. Please don't hesitate to call and ask to speak to one of the nurses or medical assistants for clinical concerns. If you have a medical emergency, go to the nearest emergency room or call 911.  A surgeon from [facility] is always on call at the hospital.     For further information, please visit [email or web address]    You may have Tylenol after 8pm tonight, if needed.  Post Anesthesia Home Care Instructions  Activity: Get plenty of rest for the remainder of the day. A responsible individual must stay with you for 24 hours following the procedure.  For the next 24 hours, DO NOT: -Drive a car Environmental consultant -Drink  alcoholic beverages -Take any medication unless instructed by your physician -Make any legal decisions or sign important papers.  Meals: Start with liquid foods such as gelatin or soup. Progress to regular foods as tolerated. Avoid greasy, spicy, heavy foods. If nausea and/or vomiting occur, drink only clear liquids until the nausea and/or vomiting subsides. Call your physician if vomiting continues.  Special Instructions/Symptoms: Your throat may feel dry or  sore from the anesthesia or the breathing tube placed in your throat during surgery. If this causes discomfort, gargle with warm salt water. The discomfort should disappear within 24 hours.  If you had a scopolamine patch placed behind your ear for the management of post- operative nausea and/or vomiting:  1. The medication in the patch is effective for 72 hours, after which it should be removed.  Wrap patch in a tissue and discard in the trash. Wash hands thoroughly with soap and water. 2. You may remove the patch earlier than 72 hours if you experience unpleasant side effects which may include dry mouth, dizziness or visual disturbances. 3. Avoid touching the patch. Wash your hands with soap and water after contact with the patch.

## 2023-03-05 NOTE — H&P (Signed)
History of Present Illness: Barbara Jackson is a 67 y.o. female who is seen today as an office consultation for evaluation of Breast Cancer  Patient seen today in the [facility]. She is a 67 year old female who noticed a lump in her right lateral breast about 2 weeks ago. Workup was done which showed a 3.2 cm mass upper outer quadrant and a second 2.2 similar mass just above that. She has multiple right axillary nodes noted as well as concern for subpectoral nodes. CEM verified this. 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 She had her mother with a breast cancer history cousin both in her 39s. She has some sores from the biopsy but no other complaint.  Review of Systems: A complete review of systems was obtained from the patient. I have reviewed this information and discussed as appropriate with the patient. See HPI as well for other ROS.    Medical History: Past Medical History:  Diagnosis Date  Anemia  Aneurysm (CMS-HCC)  Anxiety  Diabetes mellitus without complication (CMS/HHS-HCC)  GERD (gastroesophageal reflux disease)  History of cancer  Hyperlipidemia  Hypertension  Sleep apnea   Patient Active Problem List  Diagnosis  Breast lump in female  Barbara Jackson lesion, chronic  Morbid obesity (CMS/HHS-HCC)  Depression  Type 2 diabetes mellitus with hyperglycemia, without long-term current use of insulin (CMS/HHS-HCC)  Eating disorder  Essential hypertension  Hypercalcemia  Hyperlipidemia  Malignant neoplasm of upper-outer quadrant of right breast in female, estrogen receptor positive (CMS/HHS-HCC)  History of total left knee replacement  History of total knee replacement, right  Ringing in the ears, unspecified laterality  Nervously anxious   Past Surgical History:  Procedure Laterality Date  APPENDECTOMY N/A 1975  ARTHROPLASTY TOTAL KNEE Left 01/12/2012  ARTHROPLASTY TOTAL KNEE Right 01/10/2019    Allergies   Allergen Reactions  Diclofenac Other (See Comments)  GAS  Glipizide Nausea and Other (See Comments)  Lisinopril Cough  Adhesive Rash  Adhesive Tape-Silicones Rash   Current Outpatient Medications on File Prior to Visit  Medication Sig Dispense Refill  ACCU-CHEK GUIDE GLUCOSE METER Misc 1 each  ACCU-CHEK GUIDE TEST STRIPS test strip 1 strip as directed  albuterol MDI, PROVENTIL, VENTOLIN, PROAIR, HFA 90 mcg/actuation inhaler Inhale 2 Inhalations into the lungs every 6 (six) hours as needed  atorvastatin (LIPITOR) 20 MG tablet Take 20 mg by mouth once daily  diphenhydrAMINE (BENADRYL) 25 mg capsule Take 25 mg by mouth every 6 (six) hours as needed for Allergies  fluticasone propionate (FLONASE) 50 mcg/actuation nasal spray Place 2 sprays into both nostrils once daily  FUSION PLUS 130 mg iron -1,250 mcg Cap Take 1 capsule by mouth once daily  losartan-hydroCHLOROthiazide (HYZAAR) 100-25 mg tablet Take 1 tablet by mouth every morning  metFORMIN (GLUCOPHAGE-XR) 500 MG XR tablet Take 500 mg by mouth daily with dinner  MOUNJARO 12.5 mg/0.5 mL pen injector Inject 12.5 mg subcutaneously once a week  multivitamin with minerals tablet Take 1 tablet by mouth once daily  omeprazole (PRILOSEC) 40 MG DR capsule Take 40 mg by mouth every morning before breakfast  potassium chloride (KLOR-CON) 10 MEQ ER tablet Take 10 mEq by mouth once daily  sertraline (ZOLOFT) 50 MG tablet Take 50 mg by mouth daily with dinner  zolpidem (AMBIEN) 10 mg tablet Take 10 mg by mouth at bedtime as needed   No current facility-administered medications on file prior to visit.   Family History  Problem Relation  Age of Onset  Breast cancer Mother  Obesity Father    Social History   Tobacco Use  Smoking Status Never  Smokeless Tobacco Never    Social History   Socioeconomic History  Marital status: Single  Tobacco Use  Smoking status: Never  Smokeless tobacco: Never  Vaping Use  Vaping status: Never Used   Substance and Sexual Activity  Alcohol use: Yes  Comment: twice a month  Drug use: Never   Objective:  There were no vitals filed for this visit.  There is no height or weight on file to calculate BMI.  Physical Exam Exam conducted with a chaperone present.  HENT:  Head: Normocephalic.  Cardiovascular:  Pulses: Normal pulses.  Pulmonary:  Effort: Pulmonary effort is normal.  Chest:  Breasts: Right: Normal.  Left: Mass and tenderness present.  Musculoskeletal:  General: Normal range of motion.  Cervical back: Normal range of motion.  Lymphadenopathy:  Upper Body:  Right upper body: No axillary or pectoral adenopathy.  Left upper body: Axillary adenopathy and pectoral adenopathy present.  Skin: General: Skin is warm.  Neurological:  General: No focal deficit present.  Mental Status: She is alert.  Psychiatric:  Mood and Affect: Mood normal.  Behavior: Behavior normal.     Labs, Imaging and Diagnostic Testing: Multiple right breast masses upper outer quadrant 1 3.2 cm a second 2.2 cm with significant right axillary and level 2 lymph node involvement grade 3 IDC ER positive PR negative HER2/neu negative KI of 25%  Assessment and Plan:   There are no diagnoses linked to this encounter.  Stage III right breast cancer ER positive  Recommend neoadjuvant chemotherapy. She should have a partial response to that therefore downsizing her axilla and hopefully considering breast conserving surgery. She may still require mastectomy but hopefully we can downsize her axilla and then target and/or map her out. MRI will be scheduled  Patient requires port  Discussed the pros and cons of port placement and the risk of bleeding, infection, pneumothorax, injury to major vascular structure, port malfunction, catheter migration, and the need for additional treatment  as well as surgeries.   Hayden Rasmussen, MD

## 2023-03-05 NOTE — Anesthesia Preprocedure Evaluation (Addendum)
Anesthesia Evaluation  Patient identified by MRN, date of birth, ID band Patient awake    Reviewed: Allergy & Precautions, NPO status , Patient's Chart, lab work & pertinent test results  Airway Mallampati: III  TM Distance: >3 FB Neck ROM: Full    Dental  (+) Dental Advisory Given, Teeth Intact   Pulmonary sleep apnea (dental device)    Pulmonary exam normal breath sounds clear to auscultation       Cardiovascular hypertension (148/85 preop, normally 120-130s SBP), Pt. on medications Normal cardiovascular exam Rhythm:Regular Rate:Normal     Neuro/Psych  PSYCHIATRIC DISORDERS Anxiety Depression    negative neurological ROS     GI/Hepatic Neg liver ROS, hiatal hernia, PUD,GERD  Controlled,,  Endo/Other  diabetes, Well Controlled, Type 2, Oral Hypoglycemic Agents  Morbid obesityBMI 37  Renal/GU negative Renal ROS  negative genitourinary   Musculoskeletal  (+) Arthritis , Osteoarthritis,    Abdominal  (+) + obese  Peds  Hematology negative hematology ROS (+)   Anesthesia Other Findings Mounjaro LD: 02/22/23 (10d)  Reproductive/Obstetrics negative OB ROS                              Anesthesia Physical Anesthesia Plan  ASA: 2  Anesthesia Plan: General   Post-op Pain Management: Tylenol PO (pre-op)*   Induction: Intravenous  PONV Risk Score and Plan: 3 and Ondansetron, Dexamethasone, Midazolam and Treatment may vary due to age or medical condition  Airway Management Planned: LMA  Additional Equipment: None  Intra-op Plan:   Post-operative Plan: Extubation in OR  Informed Consent: I have reviewed the patients History and Physical, chart, labs and discussed the procedure including the risks, benefits and alternatives for the proposed anesthesia with the patient or authorized representative who has indicated his/her understanding and acceptance.     Dental advisory given  Plan  Discussed with: CRNA  Anesthesia Plan Comments:         Anesthesia Quick Evaluation

## 2023-03-05 NOTE — Op Note (Signed)
Preoperative diagnosis: PAC needed for chemotherapy   Postoperative diagnosis: Same  Procedure: Portacath Placement with C arm and U/S guidance   Surgeon: Dortha Schwalbe, MD, FACS  Anesthesia: General and 0.25 % marcaine with epinephrine  Clinical History and Indications: The patient is getting ready to begin chemotherapy for her cancer. She  needs a Port-A-Cath for venous access. Risk of bleeding, infection,  Collapse lung,  Death,  DVT,  Organ injury,  Mediastinal injury,  Injury to heart,  Injury to blood vessels,  Nerves,  Migration of catheter,  Embolization of catheter and the need for more surgery.  Description of Procedure: I have seen the patient in the holding area and confirmed the plans for the procedure as noted above. I reviewed the risks and complications again and the patient has no further questions. She wishes to proceed.   The patient was then taken to the operating room. After satisfactory general  anesthesia had been obtained the upper chest and lower neck were prepped and draped as a sterile field. The timeout was done.  The right internal jugular vein  was entered under U/S guidance  and the guidewire threaded into the superior vena cava right atrial area under fluoroscopic guidance. An incision was then made on the anterior chest wall and a subcutaneous pocket fashioned for the port reservoir.  The port tubing was then brought through a subcutaneous tunnel from the port site to the guidewire site.  The port and catheter were attached, locked  and flushed. The catheter was measured and cut to appropriate length.The dilator and peel-away sheath were then advanced over the guidewire while monitoring this with fluoroscopy. The guidewire and dilator were removed and the tubing threaded to approximately 21 cm. The peel-away sheath was then removed. The catheter aspirated and flushed easily. Using fluoroscopy the tip was in the superior vena cava right atrial junction area. It  aspirated and flushed easily. That aspirated and flushed easily.  The reservoir was secured to the fascia with 1 sutures of 2-0 Prolene. A final check with fluoroscopy was done to make sure we had no kinks and good positioning of the tip of the catheter. Everything appeared to be okay. The catheter was aspirated, flushed with dilute heparin and then concentrated aqueous heparin.  The incision was then closed with interrupted 3-0 Vicryl, and 4-0 Monocryl subcuticular with Dermabond on the skin.  There were no operative complications. Estimated blood loss was minimal. All counts were correct. The patient tolerated the procedure well.  Dortha Schwalbe, MD, FACS

## 2023-03-05 NOTE — Interval H&P Note (Signed)
History and Physical Interval Note:  03/05/2023 1:42 PM  Barbara Jackson  has presented today for surgery, with the diagnosis of RIGHT BREAST CANCER.  The various methods of treatment have been discussed with the patient and family. After consideration of risks, benefits and other options for treatment, the patient has consented to  Procedure(s): INSERTION PORT-A-CATH WITH ULTRASOUND GUIDEANCE (N/A) as a surgical intervention.  The patient's history has been reviewed, patient examined, no change in status, stable for surgery.  I have reviewed the patient's chart and labs.  Questions were answered to the patient's satisfaction.     Kataya Guimont A Kajuan [place]

## 2023-03-05 NOTE — Progress Notes (Signed)
HPI:   Barbara Jackson was previously seen in the [facility] due to a personal and family history of cancer and concerns regarding a hereditary predisposition to cancer. Please refer to our prior cancer genetics clinic note for more information regarding our discussion, assessment and recommendations, at the time. [name] recent genetic test results were disclosed to her, as were recommendations warranted by these results. These results and recommendations are discussed in more detail below.  CANCER HISTORY:  Oncology History  Malignant neoplasm of upper-outer quadrant of right breast in female, estrogen receptor positive (HCC)  02/10/2023 Pathology Results   Right breast needle core biopsy showed overall grade 3 invasive ductal carcinoma, right axillary lymph node with invasive ductal carcinoma, prognostic showed ER 100% positive strong staining PR negative, Ki-67 of 25% and HER2 0   02/11/2023 Mammogram   Patient had a palpable mass and hence had a diagnostic mammogram which showed dominant 4.4 x 6.2 x 4.7 cm mass highly suspicious for right breast mass in the upper outer quadrant.  Abnormal prominent right axillary lymph nodes.  There is a second smaller irregular high conspicuity enhancing mass posterior and lateral to the dominant mass measuring 2.5 x 1.3 x 1 cm.  Prominent right axillary and possibly subpectoral nodes.   02/17/2023 Initial Diagnosis   Malignant neoplasm of upper-outer quadrant of right breast in female, estrogen receptor positive (HCC)   02/18/2023 Cancer Staging   Staging form: Breast, AJCC 8th Edition - Clinical stage from 02/18/2023: Stage IIIB (cT3, cN2, cM0, G3, ER+, PR-, HER2-) - Signed by Rachel Moulds, MD on 02/18/2023 Stage prefix: Initial diagnosis Histologic grading system: 3 grade system Laterality: Right Staged by: Pathologist and managing physician Stage used in treatment planning: Yes National guidelines used in treatment planning:  Yes Type of national guideline used in treatment planning: NCCN   03/06/2023 -  Chemotherapy   Patient is on Treatment Plan : BREAST ADJUVANT DOSE DENSE AC q14d / PACLitaxel q7d      Genetic Testing   [facility] CancerNext Panel+RNA was Negative. Report date is 03/01/2023.   The CancerNext gene panel offered by [employer] includes sequencing, rearrangement analysis, and RNA analysis for the following 34 genes: APC, ATM, AXIN2, BARD1, BMPR1A, BRCA1, BRCA2, BRIP1, CDH1, CDK4, CDKN2A, CHEK2, DICER1, HOXB13, EPCAM, GREM1, MLH1, MSH2, MSH3, MSH6, MUTYH, NF1, NTHL1, PALB2, PMS2, POLD1, POLE, PTEN, RAD51C, RAD51D, SMAD4, SMARCA4, STK11, and TP53.      FAMILY HISTORY:  We obtained a detailed, 4-generation family history.  Significant diagnoses are listed below:      Family History  Problem Relation Age of Onset   Breast cancer Mother 73   Heart attack Father          Deceased, 24   COPD Father     Obesity Sister     Hypertension Brother     Healthy Son     Breast cancer Cousin 43        maternal 1st cousin   Colon cancer Neg Hx     Stomach cancer Neg Hx     Esophageal cancer Neg Hx             [name] mother was diagnosed with breast cancer at age 31, she died at age 2. She has one maternal first cousin who was diagnosed with breast cancer at age 60. Her father was diagnosed with prostate cancer in his late 27s, he died at age 55. Ms. Haven is unaware of previous family history  of genetic testing for hereditary cancer risks. There is no reported Ashkenazi Jewish ancestry.   GENETIC TEST RESULTS:  The [facility] CancerNext Panel found no pathogenic mutations.  The CancerNext gene panel offered by [employer] includes sequencing, rearrangement analysis, and RNA analysis for the following 34 genes:   APC, ATM, AXIN2, BARD1, BMPR1A, BRCA1, BRCA2, BRIP1, CDH1, CDK4, CDKN2A, CHEK2, DICER1, HOXB13, EPCAM, GREM1, MLH1, MSH2, MSH3, MSH6, MUTYH, NF1, NTHL1, PALB2, PMS2, POLD1, POLE,  PTEN, RAD51C, RAD51D, SMAD4, SMARCA4, STK11, and TP53.   The test report has been scanned into EPIC and is located under the Molecular Pathology section of the Results Review tab.  A portion of the result report is included below for reference. Genetic testing reported out on 03/01/2023.       Even though a pathogenic variant was not identified, possible explanations for the cancer in the family may include: There may be no hereditary risk for cancer in the family. The cancers in [name] and/or her family may be due to other genetic or environmental factors. There may be a gene mutation in one of these genes that current testing methods cannot detect, but that chance is small. There could be another gene that has not yet been discovered, or that we have not yet tested, that is responsible for the cancer diagnoses in the family.  It is also possible there is a hereditary cause for the cancer in the family that [name] did not inherit.  Therefore, it is important to remain in touch with cancer genetics in the future so that we can continue to offer Ms. Kepler the most up to date genetic testing.   ADDITIONAL GENETIC TESTING:  We discussed with [name] that her genetic testing was fairly extensive.  If there are genes identified to increase cancer risk that can be analyzed in the future, we would be happy to discuss and coordinate this testing at that time.    CANCER SCREENING RECOMMENDATIONS:  Ms. [name] test result is considered negative (normal).  This means that we have not identified a hereditary cause for her personal and family history of cancer at this time.   An individual's cancer risk and medical management are not determined by genetic test results alone. Overall cancer risk assessment incorporates additional factors, including personal medical history, family history, and any available genetic information that may result in a personalized plan for cancer  prevention and surveillance. Therefore, it is recommended she continue to follow the cancer management and screening guidelines provided by her oncology and primary healthcare provider.  RECOMMENDATIONS FOR FAMILY MEMBERS:   Since she did not inherit a mutation in a cancer predisposition gene included on this panel, her son could not have inherited a mutation from her in one of these genes. Individuals in this family might be at some increased risk of developing breast cancer due to the family history of breast cancer. We recommend women in this family have a yearly mammogram beginning at age 33, or 77 years younger than the earliest onset of cancer, an annual clinical breast exam, and perform monthly breast self-exams.  FOLLOW-UP:  Cancer genetics is a rapidly advancing field and it is possible that new genetic tests will be appropriate for her and/or her family members in the future. We encouraged her to remain in contact with cancer genetics on an annual basis so we can update her personal and family histories and let her know of advances in cancer genetics that may benefit this family.  Our contact number was provided. Ms. Abdelkader questions were answered to her satisfaction, and she knows she is welcome to call us at anytime with additional questions or concerns.   Lalla Brothers, MS, [facility] Genetic Counselor [email or web address] (P) [phone]

## 2023-03-06 ENCOUNTER — Encounter (HOSPITAL_BASED_OUTPATIENT_CLINIC_OR_DEPARTMENT_OTHER): Payer: Self-pay | Admitting: Surgery

## 2023-03-06 ENCOUNTER — Encounter: Payer: Self-pay | Admitting: Genetic Counselor

## 2023-03-06 ENCOUNTER — Ambulatory Visit: Payer: Medicare PPO

## 2023-03-06 ENCOUNTER — Encounter: Payer: Self-pay | Admitting: Hematology and Oncology

## 2023-03-06 NOTE — Anesthesia Postprocedure Evaluation (Signed)
Anesthesia Post Note  Patient: KENNLEY CIARAVINO  Procedure(s) Performed: INSERTION PORT-A-CATH WITH ULTRASOUND GUIDEANCE (Right: Chest)     Patient location during evaluation: PACU Anesthesia Type: General Level of consciousness: awake and alert, oriented and patient cooperative Pain management: pain level controlled Vital Signs Assessment: post-procedure vital signs reviewed and stable Respiratory status: spontaneous breathing, nonlabored ventilation and respiratory function stable Cardiovascular status: blood pressure returned to baseline and stable Postop Assessment: no apparent nausea or vomiting Anesthetic complications: no   No notable events documented.  Last Vitals:  Vitals:   03/05/23 1630 03/05/23 1721  BP: (!) 113/54 134/71  Pulse: (!) 58 (!) 59  Resp: 13 18  Temp:  (!) 36.2 C  SpO2: 92% 95%    Last Pain:  Vitals:   03/05/23 1721  TempSrc: Temporal  PainSc: 0-No pain   Pain Goal: Patients Stated Pain Goal: 3 (03/05/23 1348)                 Lannie Fields

## 2023-03-06 NOTE — Transfer of Care (Signed)
Immediate Anesthesia Transfer of Care Note  Patient: Barbara Jackson  Procedure(s) Performed: INSERTION PORT-A-CATH WITH ULTRASOUND GUIDEANCE (Right: Chest)  Patient Location: PACU  Anesthesia Type:General  Level of Consciousness: awake, alert , and patient cooperative  Airway & Oxygen Therapy: Patient Spontanous Breathing and Patient connected to face mask oxygen  Post-op Assessment: Report given to RN and Post -op Vital signs reviewed and stable  Post vital signs: Reviewed and stable  Last Vitals:  Vitals Value Taken Time  BP 134/71 03/05/23 1721  Temp 36.2 C 03/05/23 1721  Pulse 59 03/05/23 1721  Resp 18 03/05/23 1721  SpO2 95 % 03/05/23 1721    Last Pain:  Vitals:   03/05/23 1721  TempSrc: Temporal  PainSc: 0-No pain      Patients Stated Pain Goal: 3 (03/05/23 1348)  Complications: No notable events documented.

## 2023-03-08 ENCOUNTER — Encounter: Payer: Self-pay | Admitting: Internal Medicine

## 2023-03-09 ENCOUNTER — Ambulatory Visit: Payer: Medicare PPO

## 2023-03-09 MED ORDER — GLIPIZIDE ER 5 MG PO TB24: 5.0000 mg | ORAL_TABLET | Freq: Every day | ORAL | 2 refills | Status: DC

## 2023-03-11 ENCOUNTER — Ambulatory Visit (INDEPENDENT_AMBULATORY_CARE_PROVIDER_SITE_OTHER): Payer: Medicare PPO | Admitting: Family Medicine

## 2023-03-12 ENCOUNTER — Telehealth: Payer: Self-pay

## 2023-03-12 ENCOUNTER — Encounter: Payer: Self-pay | Admitting: Internal Medicine

## 2023-03-12 NOTE — Telephone Encounter (Signed)
[facility] CSW Progress Note  Clinical Social Worker attempted to reach patient for new patient protocol referral. Patient did not answer call, VM left with direct contact if needed.  No follow up scheduled at this time.   Marguerita Merles, LCSWA Clinical Social Worker [facility]

## 2023-03-13 ENCOUNTER — Encounter: Payer: Self-pay | Admitting: *Deleted

## 2023-03-13 ENCOUNTER — Encounter: Payer: Self-pay | Admitting: Hematology and Oncology

## 2023-03-16 ENCOUNTER — Telehealth: Payer: Self-pay | Admitting: Hematology and Oncology

## 2023-03-16 DIAGNOSIS — C50919 Malignant neoplasm of unspecified site of unspecified female breast: Secondary | ICD-10-CM | POA: Diagnosis not present

## 2023-03-16 DIAGNOSIS — Z170 Estrogen receptor positive status [ER+]: Secondary | ICD-10-CM

## 2023-03-16 DIAGNOSIS — R918 Other nonspecific abnormal finding of lung field: Secondary | ICD-10-CM

## 2023-03-16 DIAGNOSIS — E1165 Type 2 diabetes mellitus with hyperglycemia: Secondary | ICD-10-CM | POA: Diagnosis not present

## 2023-03-16 DIAGNOSIS — I10 Essential (primary) hypertension: Secondary | ICD-10-CM | POA: Diagnosis not present

## 2023-03-16 DIAGNOSIS — F411 Generalized anxiety disorder: Secondary | ICD-10-CM | POA: Diagnosis not present

## 2023-03-16 NOTE — Telephone Encounter (Signed)
I called and discussed the case with Raynelle Fanning about the findings of the CT scan.  The right hilar nodule as well as the lung nodules are concerning for metastatic disease but they are not definitive.  Therefore I would like to get a PET CT scan and also refer the patient to Dr. Tonia Brooms for a bronchoscopy and a biopsy.

## 2023-03-16 NOTE — Telephone Encounter (Signed)
Left voice mail asking patient to call me back to review the scans

## 2023-03-18 ENCOUNTER — Encounter: Payer: Self-pay | Admitting: Hematology and Oncology

## 2023-03-18 ENCOUNTER — Other Ambulatory Visit: Payer: Self-pay | Admitting: *Deleted

## 2023-03-18 ENCOUNTER — Encounter: Payer: Self-pay | Admitting: *Deleted

## 2023-03-18 DIAGNOSIS — Z170 Estrogen receptor positive status [ER+]: Secondary | ICD-10-CM

## 2023-03-20 ENCOUNTER — Ambulatory Visit: Payer: Medicare PPO | Admitting: Adult Health

## 2023-03-20 ENCOUNTER — Encounter: Payer: Self-pay | Admitting: *Deleted

## 2023-03-20 ENCOUNTER — Encounter: Payer: Self-pay | Admitting: Emergency Medicine

## 2023-03-20 ENCOUNTER — Ambulatory Visit: Payer: Medicare PPO | Admitting: Emergency Medicine

## 2023-03-20 ENCOUNTER — Other Ambulatory Visit: Payer: Medicare PPO

## 2023-03-20 ENCOUNTER — Ambulatory Visit: Payer: Medicare PPO

## 2023-03-20 VITALS — BP 118/78 | HR 78 | Ht 65.0 in | Wt 222.0 lb

## 2023-03-20 DIAGNOSIS — R9389 Abnormal findings on diagnostic imaging of other specified body structures: Secondary | ICD-10-CM | POA: Diagnosis not present

## 2023-03-20 NOTE — Assessment & Plan Note (Signed)
Small right hilar lymph node approximately 10 mm as well as scattered small pulmonary nodules, largest appears to be 7 mm on the left.  She has a PET scan ordered.  Certainly concerning for possible metastatic disease although we did discuss the possibility that there are other causes for pulmonary nodular disease.  We weighed the pros and cons of navigational bronchoscopy and endobronchial ultrasound.  I did explain that sensitivity will be lower in her case because the targets are small.  For now we have decided to perform robotic assisted navigational bronchoscopy and endobronchial ultrasound to try to get a tissue diagnosis.  Will try to get this set up for 03/31/2023.

## 2023-03-20 NOTE — Patient Instructions (Addendum)
VISIT SUMMARY:  During your visit, we discussed the results of your recent CT scan, which showed several small nodules in your lymph nodes. We talked about the possibility that these nodules could be related to your breast cancer. We also discussed the upcoming PET scan and bronchoscopy procedure, which will provide more information about these nodules. You were also advised to continue your current treatment for breast cancer as directed by your oncology team.  YOUR PLAN:  -PULMONARY NODULES: These are small lumps in your lungs that were found on your CT scan. We will perform a bronchoscopy to take a sample of the largest nodule and lymph node for further testing.  We will work on getting this scheduled for 03/31/2023.  You will need a designated driver and someone to watch you on that day after the procedure.  You need to come off your Mounjaro 1 week prior.  We will also review the results of your upcoming PET scan before the bronchoscopy.  -BREAST CANCER: This is your current diagnosis. The presence of nodules in your lungs and enlarged lymph node may affect your treatment plan. We will communicate the results of your bronchoscopy and biopsy to your oncology team to help them plan your treatment.  INSTRUCTIONS:  After the bronchoscopy, watch for signs of infection such as cough, mucus production, and fever. If these symptoms develop, you may need treatment for bronchitis or pneumonia. We will arrange for you to follow-up with T Parrett or Dr. Delton Coombes about 1 week after your procedure is done.

## 2023-03-20 NOTE — Progress Notes (Signed)
Subjective:    Patient ID: Barbara Jackson, female    DOB: 1956/02/16, 67 y.o.   MRN: [id number]  HPI 67 year old never smoker with a history of diabetes, GERD with a hiatal hernia, hypertension, obesity with OSA, and new diagnosis of breast cancer followed by Dr. Pamelia Hoit.  CT chest abdomen pelvis done 03/02/2023 as below.  The patient's CT scan revealed several small nodules, and a small right hilar lymph node. The patient expresses concern about the possibility of the cancer having metastasized. The patient is scheduled for a PET scan in the near future, which is expected to provide additional information about the nodules and potential metastasis.  The patient has a history of esophageal ulcerations and is currently on Mounjaro for diabetes. They deny any history of smoking or exposure to tuberculosis. 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  The patient understands the risks associated with the procedure, including the potential for a pneumothorax, bleeding, and post-procedure infection. The patient is aware that the procedure will be performed under general anesthesia and that the results are expected within two to three business days following the procedure.    CT chest abdomen pelvis 03/02/2023: FINDINGS: CT CHEST FINDINGS   Cardiovascular: No significant vascular findings. Normal heart size. No pericardial effusion.   Mediastinum/Nodes: Prominent RIGHT hilar node measures 10 mm (image 31/series 2). No supraclavicular adenopathy. No internal mammary adenopathy.   mass in the lateral RIGHT breast measures 2.6 cm. Enlarged RIGHT axillary lymph node measures 13 mm (image 21/series 2). Several prominent high axillary lymph nodes on image 14.   Lungs/Pleura: Multiple small nodules along the pleural  fissures. For example 7 mm nodule on the LEFT on image 58/4. 4 mm nodule on the RIGHT on image 72/4.   Parenchymal nodule in the RIGHT middle lobe measures 5 mm on image 62/4. Probable pulmonary nodule nestled in the RIGHT hilum measures 8 mm on image 67/4. RIGHT lower lobe nodule measures 4 mm on image 100.   Musculoskeletal: No aggressive osseous Jackson.   CT ABDOMEN AND PELVIS FINDINGS   Hepatobiliary: No focal hepatic Jackson. No biliary ductal dilatation. Gallbladder is normal. Common bile duct is normal.   Pancreas: Pancreas is normal. No ductal dilatation. No pancreatic inflammation.   Spleen: Normal spleen   Adrenals/urinary tract: Adrenal glands and kidneys are normal. The ureters and bladder normal.   Stomach/Bowel: Large sliding-type hiatal hernia with half the stomach above the diaphragm. Small bowel normal. Appendix not identified. The colon and rectosigmoid colon are normal.   Vascular/Lymphatic: Abdominal aorta is normal caliber. There is no retroperitoneal or periportal lymphadenopathy. No pelvic lymphadenopathy.   Reproductive: Uterus and adnexa unremarkable.   Other: No free fluid.   Musculoskeletal: No aggressive osseous Jackson.   IMPRESSION: CHEST:   1. RIGHT breast mass consistent with primary breast carcinoma. 2. Enlarged RIGHT axillary lymph nodes consistent with nodal metastasis. 3. Prominent RIGHT hilar lymph node concerning for nodal metastasis. 4. Multiple small pulmonary nodules along the pleural fissures and several small parenchymal nodules concerning for pulmonary metastasis.   PELVIS:   1. No evidence of metastatic disease in the abdomen pelvis. 2. Large sliding-type hiatal hernia.    Review of Systems As per HPI  Past Medical History:  Diagnosis Date   Abnormal pap 1998   Negative Hpv   Anxiety  Arthritis    Barbara Jackson, chronic    Cancer (HCC)    right breast IDC   Diabetes mellitus    Dysrhythmia    hx of  atrial tachycardia and pacs   Esophageal reflux    GERD (gastroesophageal reflux disease)    H/O hiatal hernia    H/O iron deficiency anemia 08/2016   Hypercholesteremia    Hypertension    IDA (iron deficiency anemia)    Obesity    compulsive overeater   Osteoarthritis    Sleep apnea    uses oral appliance     Family History  Problem Relation Age of Onset   Breast cancer Mother 6   Heart attack Father        Deceased, 40   COPD Father    Prostate cancer Father 70 - 67   Obesity Sister    Hypertension Brother    Healthy Son    Breast cancer Cousin 21       maternal 1st cousin   Colon cancer Neg Hx    Stomach cancer Neg Hx    Esophageal cancer Neg Hx      Social History   Socioeconomic History   Marital status: Single    Spouse name: Not on file   Number of children: 1   Years of education: Not on file   Highest education level: Not on file  Occupational History    Employer: [employer]   Occupation: retired  Tobacco Use   Smoking status: Never   Smokeless tobacco: Never  Vaping Use   Vaping status: Never Used  Substance and Sexual Activity   Alcohol use: Yes    Alcohol/week: 2.0 standard drinks of alcohol    Types: 2 Standard drinks or equivalent per week    Comment: occassionally   Drug use: No   Sexual activity: Not Currently    Partners: Male    Birth control/protection: Post-menopausal  Other Topics Concern   Not on file  Social History Narrative   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   Lives alone.     Highest level of education:  One year graduate school   Social Determinants of Health   Financial Resource Strain: Not on file  Food Insecurity: Not on file  Transportation Needs: Not on file  Physical Activity: Not on file  Stress: Not on file  Social Connections: Not on file  Intimate Partner Violence: Not on file     Allergies  Allergen Reactions   Diclofenac Other (See Comments)    GAS   Glucotrol [Glipizide] Nausea Only    Lisinopril Cough   Adhesive [Tape] Rash     Outpatient Medications Prior to Visit  Medication Sig Dispense Refill   albuterol (VENTOLIN HFA) 108 (90 Base) MCG/ACT inhaler Inhale 2 puffs into the lungs every 6 (six) hours as needed for wheezing or shortness of breath.     atorvastatin (LIPITOR) 20 MG tablet Take 20 mg by mouth daily with supper.   0   Blood Glucose Monitoring Suppl (ONE TOUCH ULTRA 2) w/Device KIT Use to check blood sugar 2-3 times a day 1 each 0   Continuous Glucose Sensor (FREESTYLE LIBRE 3 SENSOR) MISC 1 each by Does not apply route every 14 (fourteen) days. 6 each 3   diazepam (VALIUM) 5 MG tablet Take 5-10 mg by mouth every 8 (eight) hours as needed for anxiety.     diphenhydrAMINE (BENADRYL) 25 mg capsule Take 25 mg by mouth  every 6 (six) hours as needed for allergies.      fluticasone (FLONASE) 50 MCG/ACT nasal spray Place 2 sprays into both nostrils daily as needed for allergies.     FREESTYLE TEST STRIPS test strip   5   glipiZIDE (GLUCOTROL XL) 5 MG 24 hr tablet Take 1 tablet (5 mg total) by mouth daily with breakfast. 90 tablet 2   Iron-FA-B Cmp-C-Biot-Probiotic (FUSION PLUS) CAPS Take 1 capsule by mouth daily.     losartan-hydrochlorothiazide (HYZAAR) 100-25 MG per tablet Take 1 tablet by mouth every morning.     metFORMIN (GLUCOPHAGE-XR) 500 MG 24 hr tablet TAKE 4 TABLETS BY MOUTH DAILY WITH SUPPER. 360 tablet 3   omeprazole (PRILOSEC) 40 MG capsule Take 40 mg by mouth daily before breakfast.     oxyCODONE (OXY IR/ROXICODONE) 5 MG immediate release tablet Take 1 tablet (5 mg total) by mouth every 6 (six) hours as needed for severe pain. 15 tablet 0   potassium chloride (KLOR-CON) 10 MEQ tablet Take 10 mEq by mouth daily.     sertraline (ZOLOFT) 50 MG tablet Take 50 mg by mouth daily with supper.     tirzepatide (MOUNJARO) 12.5 MG/0.5ML Pen INJECT 12.5 MG SUBCUTANEOUSLY ONE TIME PER WEEK 6 mL 3   Vitamin D, Ergocalciferol, (DRISDOL) 1.25 MG (50000 UNIT) CAPS  capsule Take 1 capsule (50,000 Units total) by mouth every 7 (seven) days. 12 capsule 0   zolpidem (AMBIEN) 10 MG tablet Take 10 mg by mouth at bedtime as needed for sleep.      No facility-administered medications prior to visit.        Objective:   Physical Exam  Vitals:   03/20/23 1601  BP: 118/78  Pulse: 78  SpO2: 96%  Weight: 222 lb (100.7 kg)  Height: 5\' 5"  (1.651 m)    Gen: Pleasant, well-nourished, in no distress,  normal affect  ENT: No lesions,  mouth clear,  oropharynx clear, no postnasal drip  Neck: No JVD, no stridor  Lungs: No use of accessory muscles, no crackles or wheezing on normal respiration, no wheeze on forced expiration  Cardiovascular: RRR, heart sounds normal, no murmur or gallops, no peripheral edem  Musculoskeletal: No deformities, no cyanosis or clubbing  Neuro: alert, awake, non focal  Skin: Warm, no lesions or rash      Assessment & Plan:   Abnormal CT of the chest Small right hilar lymph node approximately 10 mm as well as scattered small pulmonary nodules, largest appears to be 7 mm on the left.  She has a PET scan ordered.  Certainly concerning for possible metastatic disease although we did discuss the possibility that there are other causes for pulmonary nodular disease.  We weighed the pros and cons of navigational bronchoscopy and endobronchial ultrasound.  I did explain that sensitivity will be lower in her case because the targets are small.  For now we have decided to perform robotic assisted navigational bronchoscopy and endobronchial ultrasound to try to get a tissue diagnosis.  Will try to get this set up for 03/31/2023.    Levy Pupa, MD, PhD 03/20/2023, 5:05 PM [facility] [phone] or if no answer before 7:00PM call [phone] For any issues after 7:00PM please call eLink [phone]

## 2023-03-20 NOTE — H&P (View-Only) (Signed)
Subjective:    Patient ID: Barbara Jackson, female    DOB: 1956/02/16, 67 y.o.   MRN: [id number]  HPI 67 year old never smoker with a history of diabetes, GERD with a hiatal hernia, hypertension, obesity with OSA, and new diagnosis of breast cancer followed by Dr. Pamelia Hoit.  CT chest abdomen pelvis done 03/02/2023 as below.  The patient's CT scan revealed several small nodules, and a small right hilar lymph node. The patient expresses concern about the possibility of the cancer having metastasized. The patient is scheduled for a PET scan in the near future, which is expected to provide additional information about the nodules and potential metastasis.  The patient has a history of esophageal ulcerations and is currently on Mounjaro for diabetes. They deny any history of smoking or exposure to tuberculosis. 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  The patient understands the risks associated with the procedure, including the potential for a pneumothorax, bleeding, and post-procedure infection. The patient is aware that the procedure will be performed under general anesthesia and that the results are expected within two to three business days following the procedure.    CT chest abdomen pelvis 03/02/2023: FINDINGS: CT CHEST FINDINGS   Cardiovascular: No significant vascular findings. Normal heart size. No pericardial effusion.   Mediastinum/Nodes: Prominent RIGHT hilar node measures 10 mm (image 31/series 2). No supraclavicular adenopathy. No internal mammary adenopathy.   mass in the lateral RIGHT breast measures 2.6 cm. Enlarged RIGHT axillary lymph node measures 13 mm (image 21/series 2). Several prominent high axillary lymph nodes on image 14.   Lungs/Pleura: Multiple small nodules along the pleural  fissures. For example 7 mm nodule on the LEFT on image 58/4. 4 mm nodule on the RIGHT on image 72/4.   Parenchymal nodule in the RIGHT middle lobe measures 5 mm on image 62/4. Probable pulmonary nodule nestled in the RIGHT hilum measures 8 mm on image 67/4. RIGHT lower lobe nodule measures 4 mm on image 100.   Musculoskeletal: No aggressive osseous lesion.   CT ABDOMEN AND PELVIS FINDINGS   Hepatobiliary: No focal hepatic lesion. No biliary ductal dilatation. Gallbladder is normal. Common bile duct is normal.   Pancreas: Pancreas is normal. No ductal dilatation. No pancreatic inflammation.   Spleen: Normal spleen   Adrenals/urinary tract: Adrenal glands and kidneys are normal. The ureters and bladder normal.   Stomach/Bowel: Large sliding-type hiatal hernia with half the stomach above the diaphragm. Small bowel normal. Appendix not identified. The colon and rectosigmoid colon are normal.   Vascular/Lymphatic: Abdominal aorta is normal caliber. There is no retroperitoneal or periportal lymphadenopathy. No pelvic lymphadenopathy.   Reproductive: Uterus and adnexa unremarkable.   Other: No free fluid.   Musculoskeletal: No aggressive osseous lesion.   IMPRESSION: CHEST:   1. RIGHT breast mass consistent with primary breast carcinoma. 2. Enlarged RIGHT axillary lymph nodes consistent with nodal metastasis. 3. Prominent RIGHT hilar lymph node concerning for nodal metastasis. 4. Multiple small pulmonary nodules along the pleural fissures and several small parenchymal nodules concerning for pulmonary metastasis.   PELVIS:   1. No evidence of metastatic disease in the abdomen pelvis. 2. Large sliding-type hiatal hernia.    Review of Systems As per HPI  Past Medical History:  Diagnosis Date   Abnormal pap 1998   Negative Hpv   Anxiety  Arthritis    [name], chronic    Cancer (HCC)    right breast IDC   Diabetes mellitus    Dysrhythmia    hx of  atrial tachycardia and pacs   Esophageal reflux    GERD (gastroesophageal reflux disease)    H/O hiatal hernia    H/O iron deficiency anemia 08/2016   Hypercholesteremia    Hypertension    IDA (iron deficiency anemia)    Obesity    compulsive overeater   Osteoarthritis    Sleep apnea    uses oral appliance     Family History  Problem Relation Age of Onset   Breast cancer Mother 6   Heart attack Father        Deceased, 40   COPD Father    Prostate cancer Father 70 - 67   Obesity Sister    Hypertension Brother    Healthy Son    Breast cancer Cousin 21       maternal 1st cousin   Colon cancer Neg Hx    Stomach cancer Neg Hx    Esophageal cancer Neg Hx      Social History   Socioeconomic History   Marital status: Single    Spouse name: Not on file   Number of children: 1   Years of education: Not on file   Highest education level: Not on file  Occupational History    Employer: [employer]   Occupation: retired  Tobacco Use   Smoking status: Never   Smokeless tobacco: Never  Vaping Use   Vaping status: Never Used  Substance and Sexual Activity   Alcohol use: Yes    Alcohol/week: 2.0 standard drinks of alcohol    Types: 2 Standard drinks or equivalent per week    Comment: occassionally   Drug use: No   Sexual activity: Not Currently    Partners: Male    Birth control/protection: Post-menopausal  Other Topics Concern   Not on file  Social History Narrative   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   Lives alone.     Highest level of education:  One year graduate school   Social Determinants of Health   Financial Resource Strain: Not on file  Food Insecurity: Not on file  Transportation Needs: Not on file  Physical Activity: Not on file  Stress: Not on file  Social Connections: Not on file  Intimate Partner Violence: Not on file     Allergies  Allergen Reactions   Diclofenac Other (See Comments)    GAS   Glucotrol [Glipizide] Nausea Only    Lisinopril Cough   Adhesive [Tape] Rash     Outpatient Medications Prior to Visit  Medication Sig Dispense Refill   albuterol (VENTOLIN HFA) 108 (90 Base) MCG/ACT inhaler Inhale 2 puffs into the lungs every 6 (six) hours as needed for wheezing or shortness of breath.     atorvastatin (LIPITOR) 20 MG tablet Take 20 mg by mouth daily with supper.   0   Blood Glucose Monitoring Suppl (ONE TOUCH ULTRA 2) w/Device KIT Use to check blood sugar 2-3 times a day 1 each 0   Continuous Glucose Sensor (FREESTYLE LIBRE 3 SENSOR) MISC 1 each by Does not apply route every 14 (fourteen) days. 6 each 3   diazepam (VALIUM) 5 MG tablet Take 5-10 mg by mouth every 8 (eight) hours as needed for anxiety.     diphenhydrAMINE (BENADRYL) 25 mg capsule Take 25 mg by mouth  every 6 (six) hours as needed for allergies.      fluticasone (FLONASE) 50 MCG/ACT nasal spray Place 2 sprays into both nostrils daily as needed for allergies.     FREESTYLE TEST STRIPS test strip   5   glipiZIDE (GLUCOTROL XL) 5 MG 24 hr tablet Take 1 tablet (5 mg total) by mouth daily with breakfast. 90 tablet 2   Iron-FA-B Cmp-C-Biot-Probiotic (FUSION PLUS) CAPS Take 1 capsule by mouth daily.     losartan-hydrochlorothiazide (HYZAAR) 100-25 MG per tablet Take 1 tablet by mouth every morning.     metFORMIN (GLUCOPHAGE-XR) 500 MG 24 hr tablet TAKE 4 TABLETS BY MOUTH DAILY WITH SUPPER. 360 tablet 3   omeprazole (PRILOSEC) 40 MG capsule Take 40 mg by mouth daily before breakfast.     oxyCODONE (OXY IR/ROXICODONE) 5 MG immediate release tablet Take 1 tablet (5 mg total) by mouth every 6 (six) hours as needed for severe pain. 15 tablet 0   potassium chloride (KLOR-CON) 10 MEQ tablet Take 10 mEq by mouth daily.     sertraline (ZOLOFT) 50 MG tablet Take 50 mg by mouth daily with supper.     tirzepatide (MOUNJARO) 12.5 MG/0.5ML Pen INJECT 12.5 MG SUBCUTANEOUSLY ONE TIME PER WEEK 6 mL 3   Vitamin D, Ergocalciferol, (DRISDOL) 1.25 MG (50000 UNIT) CAPS  capsule Take 1 capsule (50,000 Units total) by mouth every 7 (seven) days. 12 capsule 0   zolpidem (AMBIEN) 10 MG tablet Take 10 mg by mouth at bedtime as needed for sleep.      No facility-administered medications prior to visit.        Objective:   Physical Exam  Vitals:   03/20/23 1601  BP: 118/78  Pulse: 78  SpO2: 96%  Weight: 222 lb (100.7 kg)  Height: 5\' 5"  (1.651 m)    Gen: Pleasant, well-nourished, in no distress,  normal affect  ENT: No lesions,  mouth clear,  oropharynx clear, no postnasal drip  Neck: No JVD, no stridor  Lungs: No use of accessory muscles, no crackles or wheezing on normal respiration, no wheeze on forced expiration  Cardiovascular: RRR, heart sounds normal, no murmur or gallops, no peripheral edem  Musculoskeletal: No deformities, no cyanosis or clubbing  Neuro: alert, awake, non focal  Skin: Warm, no lesions or rash      Assessment & Plan:   Abnormal CT of the chest Small right hilar lymph node approximately 10 mm as well as scattered small pulmonary nodules, largest appears to be 7 mm on the left.  She has a PET scan ordered.  Certainly concerning for possible metastatic disease although we did discuss the possibility that there are other causes for pulmonary nodular disease.  We weighed the pros and cons of navigational bronchoscopy and endobronchial ultrasound.  I did explain that sensitivity will be lower in her case because the targets are small.  For now we have decided to perform robotic assisted navigational bronchoscopy and endobronchial ultrasound to try to get a tissue diagnosis.  Will try to get this set up for 03/31/2023.    Levy Pupa, MD, PhD 03/20/2023, 5:05 PM [facility] [phone] or if no answer before 7:00PM call [phone] For any issues after 7:00PM please call eLink [phone]

## 2023-03-23 ENCOUNTER — Encounter: Payer: Self-pay | Admitting: *Deleted

## 2023-03-23 ENCOUNTER — Ambulatory Visit: Payer: Medicare PPO

## 2023-03-24 ENCOUNTER — Ambulatory Visit
Admission: RE | Admit: 2023-03-24 | Discharge: 2023-03-24 | Disposition: A | Discharge status: Home / Self Care | Payer: Medicare PPO | Source: Ambulatory Visit | Attending: Hematology and Oncology | Admitting: Hematology and Oncology

## 2023-03-24 DIAGNOSIS — C50919 Malignant neoplasm of unspecified site of unspecified female breast: Secondary | ICD-10-CM | POA: Diagnosis not present

## 2023-03-24 DIAGNOSIS — I700 Atherosclerosis of aorta: Secondary | ICD-10-CM | POA: Diagnosis not present

## 2023-03-24 DIAGNOSIS — R918 Other nonspecific abnormal finding of lung field: Secondary | ICD-10-CM | POA: Insufficient documentation

## 2023-03-24 DIAGNOSIS — E119 Type 2 diabetes mellitus without complications: Secondary | ICD-10-CM | POA: Insufficient documentation

## 2023-03-24 DIAGNOSIS — Z452 Encounter for adjustment and management of vascular access device: Secondary | ICD-10-CM | POA: Insufficient documentation

## 2023-03-24 LAB — GLUCOSE, CAPILLARY: Glucose-Capillary: 134 mg/dL — ABNORMAL HIGH (ref 70–99)

## 2023-03-24 MED ORDER — FLUDEOXYGLUCOSE F - 18 (FDG) INJECTION
11.5000 | Freq: Once | INTRAVENOUS | Status: AC | PRN
  Administered 2023-03-24: 12.12 via INTRAVENOUS

## 2023-03-27 ENCOUNTER — Encounter: Payer: Self-pay | Admitting: General Practice

## 2023-03-27 ENCOUNTER — Encounter (HOSPITAL_COMMUNITY): Payer: Self-pay | Admitting: Emergency Medicine

## 2023-03-27 NOTE — Anesthesia Preprocedure Evaluation (Addendum)
Anesthesia Evaluation  Patient identified by MRN, date of birth, ID band Patient awake    Reviewed: Allergy & Precautions, NPO status , Patient's Chart, lab work & pertinent test results  Airway Mallampati: III  TM Distance: >3 FB Neck ROM: Full    Dental  (+) Dental Advisory Given, Teeth Intact   Pulmonary sleep apnea (dental device)    Pulmonary exam normal breath sounds clear to auscultation       Cardiovascular hypertension, Pt. on medications Normal cardiovascular exam Rhythm:Regular Rate:Normal     Neuro/Psych  PSYCHIATRIC DISORDERS Anxiety Depression    negative neurological ROS     GI/Hepatic Neg liver ROS, hiatal hernia, PUD,GERD  Controlled,,  Endo/Other  diabetes, Well Controlled, Type 2, Oral Hypoglycemic Agents  Morbid obesityBMI 37  Renal/GU negative Renal ROS  negative genitourinary   Musculoskeletal  (+) Arthritis , Osteoarthritis,    Abdominal  (+) + obese  Peds  Hematology negative hematology ROS (+)   Anesthesia Other Findings   Reproductive/Obstetrics negative OB ROS                             Anesthesia Physical Anesthesia Plan  ASA: 3  Anesthesia Plan: General   Post-op Pain Management: Minimal or no pain anticipated   Induction: Intravenous  PONV Risk Score and Plan: 3 and Ondansetron, Midazolam, Treatment may vary due to age or medical condition and Propofol infusion  Airway Management Planned: Oral ETT  Additional Equipment: None  Intra-op Plan:   Post-operative Plan: Extubation in OR  Informed Consent: I have reviewed the patients History and Physical, chart, labs and discussed the procedure including the risks, benefits and alternatives for the proposed anesthesia with the patient or authorized representative who has indicated his/her understanding and acceptance.     Dental advisory given  Plan Discussed with: CRNA  Anesthesia Plan Comments:  (PAT note written 03/27/2023 by Shonna Chock, PA-C.  )       Anesthesia Quick Evaluation

## 2023-03-27 NOTE — Progress Notes (Signed)
Anesthesia Chart Review: SAME DAY WORK-UP  Case: [id number] Date/Time: 03/31/23 1200   Procedures:      ROBOTIC ASSISTED NAVIGATIONAL BRONCHOSCOPY (Bilateral)     ENDOBRONCHIAL ULTRASOUND (Bilateral)   Anesthesia type: General   Pre-op diagnosis:      HYLER ADENOPATHY      PULMONARY NODULES   [facility] / [facility]   Surgeons: Leslye Peer, MD       DISCUSSION: Patient is a 67 year old female scheduled for the above procedure. She was recently diagnosed with right breast cancer and is preparing for chemotherapy. 03/02/23 chest CT showed multiple pulmonary nodules and enlarged right axillary and hilar nodes concerning for metastatic disease. Oncology referred patient to pulmonology for bronchoscopy and a biopsy.   History includes never smoker, DM2 (diagnosed 2007), hypercholesterolemia, dysrhythmia (atrial tachycardia, PACs), GERD, hiatal hernia (large on CT), OSA (uses oral appliance), iron deficiency anemia, right breast cancer (02/09/23: IDC, lymphovascular invasion present), osteoarthritis (left TKA 01/12/12, right TKA 01/10/19), splenic artery aneurysm (thrombosed), Port-a-cath (03/05/23).   Pre-chemotherapy echo on 02/25/23 showed LVEF 65 to 70%, no regional wall motion abnormalities, grade 1 diastolic dysfunction, normal RV systolic function, normal PASP. 03/03/23 EKG showed NSR.  She had labs through [facility] on 03/11/23 (see below or Care Everywhere). Anesthesia team to evaluate on the day of surgery. Last Mounjaro was 03/22/23.     VS: LMP 06/16/2005  Wt Readings from Last 3 Encounters:  03/20/23 100.7 kg  03/05/23 101 kg  02/24/23 100.5 kg   BP Readings from Last 3 Encounters:  03/20/23 118/78  03/05/23 134/71  02/24/23 134/86   Pulse Readings from Last 3 Encounters:  03/20/23 78  03/05/23 (!) 59  02/24/23 96     PROVIDERS: Farris Has, MD is PCP  -Rachel Moulds, MD is HEM-ONC, but she has also gotten a second opinion from Serena Croissant, MD and had pending evaluation at [facility] with Tawanna Sat, MD.  - Myra Gianotti Ines Bloomer, MD is vascular surgeon. Last visit 01/19/23. He doubted her 1.8 cm thrombosed splenic artery aneurysm would require intervention, but will get repeat imaging in 2 years.  Ileene Patrick, MD is GI   LABS: For labs as indicated on arrival. Currently, most recent lab results in [facility] include: Sodium 134, potassium 3.5, glucose 169, BUN 19, creatinine 0.95, AST 15, ALT 16, calcium 9.1, magnesium 1.6, GGT 19, TSH 1.986, free T41.1, PT 10.7, INR 1.0, APTT 7.1, A1c 6.9%, WBC 10.3, hemoglobin 14.7, hematocrit 43.0, platelet count 456.    IMAGES: PET Scan 03/24/23: IMPRESSION: 1. Hypermetabolic right lateral breast mass consistent with primary breast neoplasm. Adjacent hypermetabolic lesion in the right pectoralis muscle. 2. Hypermetabolic right axillary and bilateral hilar lymph nodes consistent with metastatic disease. 3. Multiple bilateral pulmonary nodules consistent with pulmonary metastatic disease. 4. No findings for metastatic disease involving the abdomen/pelvis. 5. Single hypermetabolic focus in the posterior right acetabulum suggesting a bone metastasis.  CT Chest/abd/pelvis 03/02/23: IMPRESSION: CHEST: 1. RIGHT breast mass consistent with primary breast carcinoma. 2. Enlarged RIGHT axillary lymph nodes consistent with nodal metastasis. 3. Prominent RIGHT hilar lymph node concerning for nodal metastasis. 4. Multiple small pulmonary nodules along the pleural fissures and several small parenchymal nodules concerning for pulmonary metastasis.   PELVIS: 1. No evidence of metastatic disease in the abdomen pelvis. 2. Large sliding-type hiatal hernia.   EKG: 03/03/23: NSR   CV: Echo 02/25/23: IMPRESSIONS   1. Left ventricular ejection fraction, by estimation,  is 65 to 70%. Left  ventricular ejection fraction by 3D volume is 68 %. The left ventricle has  normal  function. The left ventricle has no regional wall motion  abnormalities. Left ventricular diastolic   parameters are consistent with Grade I diastolic dysfunction (impaired  relaxation). Elevated left ventricular end-diastolic pressure. The average  left ventricular global longitudinal strain is -18.5 %. The global  longitudinal strain is normal.   2. Right ventricular systolic function is normal. The right ventricular  size is normal. There is normal pulmonary artery systolic pressure.   3. The mitral valve is normal in structure. No evidence of mitral valve  regurgitation. No evidence of mitral stenosis.   4. The aortic valve is tricuspid. Aortic valve regurgitation is not  visualized. No aortic stenosis is present.   5. The inferior vena cava is normal in size with greater than 50%  respiratory variability, suggesting right atrial pressure of 3 mmHg.   Past Medical History:  Diagnosis Date   Abnormal pap 1998   Negative Hpv   Anxiety    Arthritis    Breast cancer (HCC) 02/09/2023   right   [name], chronic    Cancer (HCC)    right breast IDC   Diabetes mellitus    Dysrhythmia    hx of atrial tachycardia and pacs   Esophageal reflux    GERD (gastroesophageal reflux disease)    H/O hiatal hernia    H/O iron deficiency anemia 08/2016   Hypercholesteremia    Hypertension    IDA (iron deficiency anemia)    Obesity    compulsive overeater   Osteoarthritis    Sleep apnea    uses oral appliance   Splenic artery aneurysm [facility])     Past Surgical History:  Procedure Laterality Date   APPENDECTOMY  1975   PORTACATH PLACEMENT Right 03/05/2023   Procedure: INSERTION PORT-A-CATH WITH ULTRASOUND Julian Reil;  Surgeon: Harriette Bouillon, MD;  Location: [facility];  Service: General;  Laterality: Right;   TOTAL KNEE ARTHROPLASTY  01/12/2012   Procedure: TOTAL KNEE ARTHROPLASTY;  Surgeon: Loanne Drilling, MD;  Location: [facility];  Service: Orthopedics;  Laterality:  Left;   TOTAL KNEE ARTHROPLASTY Right 01/10/2019   Procedure: TOTAL KNEE ARTHROPLASTY;  Surgeon: Ollen Gross, MD;  Location: [facility];  Service: Orthopedics;  Laterality: Right;     MEDICATIONS: No current facility-administered medications for this encounter.    albuterol (VENTOLIN HFA) 108 (90 Base) MCG/ACT inhaler   atorvastatin (LIPITOR) 20 MG tablet   Blood Glucose Monitoring Suppl (ONE TOUCH ULTRA 2) w/Device KIT   Continuous Glucose Sensor (FREESTYLE LIBRE 3 SENSOR) MISC   diazepam (VALIUM) 5 MG tablet   diphenhydrAMINE (BENADRYL) 25 mg capsule   fluticasone (FLONASE) 50 MCG/ACT nasal spray   FREESTYLE TEST STRIPS test strip   glipiZIDE (GLUCOTROL XL) 5 MG 24 hr tablet   Iron-FA-B Cmp-C-Biot-Probiotic (FUSION PLUS) CAPS   losartan-hydrochlorothiazide (HYZAAR) 100-25 MG per tablet   metFORMIN (GLUCOPHAGE-XR) 500 MG 24 hr tablet   omeprazole (PRILOSEC) 40 MG capsule   oxyCODONE (OXY IR/ROXICODONE) 5 MG immediate release tablet   potassium chloride (KLOR-CON) 10 MEQ tablet   sertraline (ZOLOFT) 50 MG tablet   tirzepatide (MOUNJARO) 12.5 MG/0.5ML Pen   Vitamin D, Ergocalciferol, (DRISDOL) 1.25 MG (50000 UNIT) CAPS capsule   zolpidem (AMBIEN) 10 MG tablet    Shonna Chock, PA-C Surgical Short Stay/Anesthesiology [facility] Phone [phone] [facility] Phone [phone] 03/27/2023 4:20 PM

## 2023-03-27 NOTE — Progress Notes (Signed)
[facility] Spiritual Care Note  Returned [employer] inquiry about eligibility for [place] with a voicemail providing information and encouraging return call.   [address] [place], [place], [facility] Pager [phone] Voicemail [phone]

## 2023-03-27 NOTE — Progress Notes (Signed)
SDW call  Patient was given pre-op instructions over the phone. Patient verbalized understanding of instructions provided.     PCP - Dr. Farris Has Cardiologist -  Pulmonary:    PPM/ICD - Denies Device Orders - na Rep Notified - na   Chest x-ray - 03/05/2023 EKG -  03/03/2023 Stress Test - ECHO - 02/25/2023 Cardiac Cath -   Sleep Study/sleep apnea/CPAP: OSA, uses a mouth appliance at night  Type II diabetic Fasting Blood sugar range:  120-140 How often check sugars: weekly Glipizide, instructed to hold the morning of surgery Metformin, instructed to hold the morning of surgery  GLP1:  Mournjaro, states last dose 03/22/2023   Blood Thinner Instructions: denies Aspirin Instructions:denies   ERAS Protcol - NPO   COVID TEST- na    Anesthesia review: Yes. HTN, DM, OSA, high cholesterol   Patient denies shortness of breath, fever, cough and chest pain over the phone call  Your procedure is scheduled on Tuesday March 31, 2023  Report to [facility] Main Entrance "A" at  0930  A.M., then check in with the Admitting office.  Call this number if you have problems the morning of surgery:  [phone]   If you have any questions prior to your surgery date call [phone]: Open Monday-Friday 8am-4pm If you experience any cold or flu symptoms such as cough, fever, chills, shortness of breath, etc. between now and your scheduled surgery, please notify us at the above number    Remember:  Do not eat or drink after midnight the night before your surgery  Take these medicines the morning of surgery with A SIP OF WATER:  none  As needed: Albuterol, diazepam, benadryl, flonase, prilosec  As of today, STOP taking any Aspirin (unless otherwise instructed by your surgeon) Aleve, Naproxen, Ibuprofen, Motrin, Advil, Goody's, BC's, all herbal medications, fish oil, and all vitamins.

## 2023-03-31 ENCOUNTER — Encounter (HOSPITAL_COMMUNITY): Payer: Self-pay | Admitting: Emergency Medicine

## 2023-03-31 ENCOUNTER — Ambulatory Visit (HOSPITAL_COMMUNITY)
Admission: RE | Admit: 2023-03-31 | Discharge: 2023-03-31 | Disposition: A | Discharge status: Home / Self Care | Payer: Medicare PPO | Attending: Emergency Medicine | Admitting: Emergency Medicine

## 2023-03-31 ENCOUNTER — Ambulatory Visit (HOSPITAL_BASED_OUTPATIENT_CLINIC_OR_DEPARTMENT_OTHER): Payer: Medicare PPO | Admitting: Vascular Surgery

## 2023-03-31 ENCOUNTER — Encounter: Payer: Medicare PPO | Admitting: Gastroenterology

## 2023-03-31 ENCOUNTER — Ambulatory Visit (HOSPITAL_COMMUNITY): Payer: Medicare PPO

## 2023-03-31 ENCOUNTER — Encounter (HOSPITAL_COMMUNITY)
Admission: RE | Disposition: A | Discharge status: Home / Self Care | Payer: Self-pay | Source: Home / Self Care | Attending: Emergency Medicine

## 2023-03-31 ENCOUNTER — Ambulatory Visit (HOSPITAL_COMMUNITY): Payer: Medicare PPO | Admitting: Vascular Surgery

## 2023-03-31 DIAGNOSIS — I10 Essential (primary) hypertension: Secondary | ICD-10-CM

## 2023-03-31 DIAGNOSIS — R911 Solitary pulmonary nodule: Secondary | ICD-10-CM

## 2023-03-31 DIAGNOSIS — C771 Secondary and unspecified malignant neoplasm of intrathoracic lymph nodes: Secondary | ICD-10-CM | POA: Diagnosis not present

## 2023-03-31 DIAGNOSIS — E119 Type 2 diabetes mellitus without complications: Secondary | ICD-10-CM | POA: Diagnosis not present

## 2023-03-31 DIAGNOSIS — C801 Malignant (primary) neoplasm, unspecified: Secondary | ICD-10-CM | POA: Diagnosis not present

## 2023-03-31 DIAGNOSIS — K449 Diaphragmatic hernia without obstruction or gangrene: Secondary | ICD-10-CM | POA: Diagnosis not present

## 2023-03-31 DIAGNOSIS — Z48813 Encounter for surgical aftercare following surgery on the respiratory system: Secondary | ICD-10-CM | POA: Diagnosis not present

## 2023-03-31 DIAGNOSIS — R599 Enlarged lymph nodes, unspecified: Secondary | ICD-10-CM | POA: Diagnosis not present

## 2023-03-31 DIAGNOSIS — E7800 Pure hypercholesterolemia, unspecified: Secondary | ICD-10-CM | POA: Diagnosis not present

## 2023-03-31 DIAGNOSIS — C50911 Malignant neoplasm of unspecified site of right female breast: Secondary | ICD-10-CM | POA: Insufficient documentation

## 2023-03-31 DIAGNOSIS — Z7985 Long-term (current) use of injectable non-insulin antidiabetic drugs: Secondary | ICD-10-CM | POA: Insufficient documentation

## 2023-03-31 DIAGNOSIS — R918 Other nonspecific abnormal finding of lung field: Secondary | ICD-10-CM | POA: Diagnosis not present

## 2023-03-31 DIAGNOSIS — K219 Gastro-esophageal reflux disease without esophagitis: Secondary | ICD-10-CM | POA: Diagnosis not present

## 2023-03-31 DIAGNOSIS — Z7984 Long term (current) use of oral hypoglycemic drugs: Secondary | ICD-10-CM | POA: Insufficient documentation

## 2023-03-31 DIAGNOSIS — R590 Localized enlarged lymph nodes: Secondary | ICD-10-CM | POA: Diagnosis not present

## 2023-03-31 DIAGNOSIS — R0989 Other specified symptoms and signs involving the circulatory and respiratory systems: Secondary | ICD-10-CM | POA: Diagnosis not present

## 2023-03-31 DIAGNOSIS — Z6837 Body mass index (BMI) 37.0-37.9, adult: Secondary | ICD-10-CM | POA: Insufficient documentation

## 2023-03-31 DIAGNOSIS — R9389 Abnormal findings on diagnostic imaging of other specified body structures: Secondary | ICD-10-CM | POA: Diagnosis not present

## 2023-03-31 DIAGNOSIS — G4733 Obstructive sleep apnea (adult) (pediatric): Secondary | ICD-10-CM | POA: Diagnosis not present

## 2023-03-31 DIAGNOSIS — Z853 Personal history of malignant neoplasm of breast: Secondary | ICD-10-CM | POA: Diagnosis not present

## 2023-03-31 HISTORY — PX: ENDOBRONCHIAL ULTRASOUND: SHX5096

## 2023-03-31 HISTORY — PX: FINE NEEDLE ASPIRATION BIOPSY: CATH118315

## 2023-03-31 HISTORY — PX: BRONCHIAL BRUSHINGS: SHX5108

## 2023-03-31 HISTORY — PX: BRONCHIAL NEEDLE ASPIRATION BIOPSY: SHX5106

## 2023-03-31 HISTORY — DX: Aneurysm of other specified arteries: I72.8

## 2023-03-31 LAB — GLUCOSE, CAPILLARY
Glucose-Capillary: 146 mg/dL — ABNORMAL HIGH (ref 70–99)
Glucose-Capillary: 92 mg/dL (ref 70–99)
Glucose-Capillary: 111 mg/dL — ABNORMAL HIGH (ref 70–99)

## 2023-03-31 SURGERY — BRONCHOSCOPY, WITH BIOPSY USING ELECTROMAGNETIC NAVIGATION
Anesthesia: General | Laterality: Bilateral

## 2023-03-31 MED ORDER — EPHEDRINE SULFATE-NACL 50-0.9 MG/10ML-% IV SOSY
PREFILLED_SYRINGE | INTRAVENOUS | Status: DC | PRN
  Administered 2023-03-31 (×2): 5 mg via INTRAVENOUS

## 2023-03-31 MED ORDER — DEXAMETHASONE SODIUM PHOSPHATE 10 MG/ML IJ SOLN
INTRAMUSCULAR | Status: DC | PRN
  Administered 2023-03-31: 10 mg via INTRAVENOUS

## 2023-03-31 MED ORDER — SODIUM CHLORIDE 0.9 % IV SOLN
INTRAVENOUS | Status: DC | PRN
Start: 2023-03-31 — End: 2023-03-31

## 2023-03-31 MED ORDER — PHENYLEPHRINE HCL-NACL 20-0.9 MG/250ML-% IV SOLN
INTRAVENOUS | Status: DC | PRN
  Administered 2023-03-31: 50 ug/min via INTRAVENOUS

## 2023-03-31 MED ORDER — PROPOFOL 500 MG/50ML IV EMUL
INTRAVENOUS | Status: DC | PRN
Start: 2023-03-31 — End: 2023-03-31
  Administered 2023-03-31: 100 ug/kg/min via INTRAVENOUS
  Administered 2023-03-31: 75 ug/kg/min via INTRAVENOUS

## 2023-03-31 MED ORDER — ROCURONIUM BROMIDE 10 MG/ML (PF) SYRINGE
PREFILLED_SYRINGE | INTRAVENOUS | Status: DC | PRN
  Administered 2023-03-31: 10 mg via INTRAVENOUS
  Administered 2023-03-31: 50 mg via INTRAVENOUS
  Administered 2023-03-31: 10 mg via INTRAVENOUS

## 2023-03-31 MED ORDER — FENTANYL CITRATE (PF) 100 MCG/2ML IJ SOLN
INTRAMUSCULAR | Status: AC
  Filled 2023-03-31: qty 2

## 2023-03-31 MED ORDER — HYDROMORPHONE HCL 1 MG/ML IJ SOLN: 0.2500 mg | INTRAMUSCULAR | Status: DC | PRN

## 2023-03-31 MED ORDER — SUGAMMADEX SODIUM 200 MG/2ML IV SOLN
INTRAVENOUS | Status: DC | PRN
  Administered 2023-03-31 (×2): 200 mg via INTRAVENOUS

## 2023-03-31 MED ORDER — OXYCODONE HCL 5 MG/5ML PO SOLN: 5.0000 mg | Freq: Once | ORAL | Status: DC | PRN

## 2023-03-31 MED ORDER — OXYCODONE HCL 5 MG PO TABS: 5.0000 mg | ORAL_TABLET | Freq: Once | ORAL | Status: DC | PRN

## 2023-03-31 MED ORDER — SUCCINYLCHOLINE CHLORIDE 200 MG/10ML IV SOSY
PREFILLED_SYRINGE | INTRAVENOUS | Status: DC | PRN
  Administered 2023-03-31: 160 mg via INTRAVENOUS

## 2023-03-31 MED ORDER — SODIUM CHLORIDE 0.9 % IV SOLN
12.5000 mg | INTRAVENOUS | Status: DC | PRN
  Filled 2023-03-31: qty 0.5

## 2023-03-31 MED ORDER — FENTANYL CITRATE (PF) 100 MCG/2ML IJ SOLN
INTRAMUSCULAR | Status: DC | PRN
Start: 2023-03-31 — End: 2023-03-31
  Administered 2023-03-31: 100 ug via INTRAVENOUS

## 2023-03-31 MED ORDER — INSULIN ASPART 100 UNIT/ML IJ SOLN
0.0000 [IU] | INTRAMUSCULAR | Status: DC | PRN
  Administered 2023-03-31: 2 [IU] via SUBCUTANEOUS
  Filled 2023-03-31: qty 1
  Filled 2023-03-31 (×2): qty 0.14

## 2023-03-31 MED ORDER — ONDANSETRON HCL 4 MG/2ML IJ SOLN
INTRAMUSCULAR | Status: DC | PRN
  Administered 2023-03-31: 4 mg via INTRAVENOUS

## 2023-03-31 MED ORDER — LIDOCAINE 2% (20 MG/ML) 5 ML SYRINGE
INTRAMUSCULAR | Status: DC | PRN
  Administered 2023-03-31: 60 mg via INTRAVENOUS

## 2023-03-31 MED ORDER — PROPOFOL 10 MG/ML IV BOLUS
INTRAVENOUS | Status: DC | PRN
  Administered 2023-03-31: 150 mg via INTRAVENOUS

## 2023-03-31 MED ORDER — ALBUMIN HUMAN 5 % IV SOLN
INTRAVENOUS | Status: DC | PRN
Start: 2023-03-31 — End: 2023-03-31

## 2023-03-31 MED ORDER — MEPERIDINE HCL 25 MG/ML IJ SOLN: 6.2500 mg | INTRAMUSCULAR | Status: DC | PRN

## 2023-03-31 MED ORDER — CHLORHEXIDINE GLUCONATE 0.12 % MT SOLN
15.0000 mL | Freq: Once | OROMUCOSAL | Status: AC
  Administered 2023-03-31: 15 mL via OROMUCOSAL
  Filled 2023-03-31: qty 15

## 2023-03-31 MED ORDER — AMISULPRIDE (ANTIEMETIC) 5 MG/2ML IV SOLN: 10.0000 mg | Freq: Once | INTRAVENOUS | Status: DC | PRN

## 2023-03-31 NOTE — Transfer of Care (Signed)
Immediate Anesthesia Transfer of Care Note  Patient: CHRISTABEL CAMIRE  Procedure(s) Performed: ROBOTIC ASSISTED NAVIGATIONAL BRONCHOSCOPY (Bilateral) ENDOBRONCHIAL ULTRASOUND (Bilateral) BRONCHIAL BRUSHINGS BRONCHIAL NEEDLE ASPIRATION BIOPSIES FINE NEEDLE ASPIRATION BIOPSY  Patient Location: PACU  Anesthesia Type:General  Level of Consciousness: drowsy and patient cooperative  Airway & Oxygen Therapy: Patient Spontanous Breathing and Patient connected to nasal cannula oxygen  Post-op Assessment: Report given to RN, Post -op Vital signs reviewed and stable, and Patient moving all extremities X 4  Post vital signs: Reviewed and stable  Last Vitals:  Vitals Value Taken Time  BP 105/62 03/31/23 1334  Temp    Pulse 61 03/31/23 1337  Resp 13 03/31/23 1337  SpO2 96 % 03/31/23 1337  Vitals shown include unfiled device data.  Last Pain:  Vitals:   03/31/23 1022  TempSrc:   PainSc: 0-No pain         Complications: No notable events documented.

## 2023-03-31 NOTE — Anesthesia Procedure Notes (Signed)
Procedure Name: Intubation Date/Time: 03/31/2023 12:05 PM  Performed by: Alease Medina, CRNAPre-anesthesia Checklist: Patient identified, Emergency Drugs available, Suction available, Patient being monitored and Timeout performed Patient Re-evaluated:Patient Re-evaluated prior to induction Oxygen Delivery Method: Circle system utilized Preoxygenation: Pre-oxygenation with 100% oxygen Induction Type: IV induction Ventilation: Mask ventilation without difficulty Laryngoscope Size: Mac and 4 Grade View: Grade I Tube type: Oral Tube size: 8.5 mm Number of attempts: 1 Airway Equipment and Method: Stylet Placement Confirmation: ETT inserted through vocal cords under direct vision, positive ETCO2 and breath sounds checked- equal and bilateral Secured at: 21 cm Tube secured with: Tape Dental Injury: Teeth and Oropharynx as per pre-operative assessment

## 2023-03-31 NOTE — Op Note (Signed)
Video Bronchoscopy with Robotic Assisted Bronchoscopic Navigation and Endobronchial Ultrasound Procedure Note  Date of Operation: 03/31/2023   Pre-op Diagnosis: Bilateral pulmonary nodules, right mediastinal adenopathy  Post-op Diagnosis: Same  Surgeon: Levy Pupa  Assistants: None  Anesthesia: General endotracheal anesthesia  Operation: Flexible video fiberoptic bronchoscopy with robotic assistance and biopsies.  Estimated Blood Loss: Minimal  Complications: None  Indications and History: Barbara Jackson is a 67 y.o. female with history of newly diagnosed breast cancer.  Her imaging shows multiple scattered small pulmonary nodules associated with the pleura as well as some hypermetabolic right hilar/mediastinal lymphadenopathy.  Recommendation made to achieve a tissue diagnosis via robotic assisted navigational bronchoscopy and endobronchial ultrasound with biopsies. The risks, benefits, complications, treatment options and expected outcomes were discussed with the patient.  The possibilities of pneumothorax, pneumonia, reaction to medication, pulmonary aspiration, perforation of a viscus, bleeding, failure to diagnose a condition and creating a complication requiring transfusion or operation were discussed with the patient who freely signed the consent.    Description of Procedure: The patient was seen in the Preoperative Area, was examined and was deemed appropriate to proceed.  The patient was taken to [facility] endoscopy room 3, identified as Barbara Jackson and the procedure verified as Flexible Video Fiberoptic Bronchoscopy.  A Time Out was held and the above information confirmed.   Prior to the date of the procedure a high-resolution CT scan of the chest was performed. Utilizing ION software program a virtual tracheobronchial tree was generated to allow the creation of distinct navigation pathways to the patient's parenchymal abnormalities. After being taken to the operating room  general anesthesia was initiated and the patient  was orally intubated. The video fiberoptic bronchoscope was introduced via the endotracheal tube and a general inspection was performed which showed normal right and left lung anatomy. Aspiration of the bilateral mainstems was completed to remove any remaining secretions. Robotic catheter inserted into patient's endotracheal tube.   Target #1 left lower lobe pulmonary nodule: The distinct navigation pathways prepared prior to this procedure were then utilized to navigate to patient's lesion identified on CT scan. The robotic catheter was secured into place and the vision probe was withdrawn.  Lesion location was approximated using fluoroscopy.  Local registration and targeting was performed using Cios three-dimensional imaging. Under fluoroscopic guidance transbronchial brushings, transbronchial needle biopsies were performed to be sent for cytology and pathology.   Target #2 left upper lobe pulmonary nodule: The distinct navigation pathways prepared prior to this procedure were then utilized to navigate to patient's lesion identified on CT scan. The robotic catheter was secured into place and the vision probe was withdrawn.  Lesion location was approximated using fluoroscopy. Local registration and targeting was performed using Cios three-dimensional imaging. Under fluoroscopic guidance transbronchial brushings, transbronchial needle biopsies were performed to be sent for cytology and pathology.   The standard scope was then withdrawn and the endobronchial ultrasound was used to identify and characterize the peritracheal, hilar and bronchial lymph nodes. Inspection showed an enlarged lymph node at station 11R. Using real-time ultrasound guidance Wang needle biopsies were take from Station 11R nodes and were sent for cytology.  At the end of the procedure a general airway inspection was performed and there was no evidence of active bleeding. The  bronchoscope was removed.  The patient tolerated the procedure well. There was no significant blood loss and there were no obvious complications. A post-procedural chest x-ray is pending.  Samples Target #1: 1. Transbronchial brushings from left  lower lobe pulmonary nodule 2. Transbronchial Wang needle biopsies from left lower lobe pulmonary nodule  Samples Target #2: 1. Transbronchial brushings from left upper lobe pulmonary nodule 2. Transbronchial Wang needle biopsies from the upper lobe pulmonary nodule  EBUS Samples: 1. Wang needle biopsies from 11R node  Plans:  The patient will be discharged from the PACU to home when recovered from anesthesia and after chest x-ray is reviewed. We will review the cytology, pathology and microbiology results with the patient when they become available. Outpatient followup will be with Saralyn Pilar, NP and Dr Delton Coombes.     Levy Pupa, MD, PhD 03/31/2023, 1:27 PM  Pulmonary and Critical Care [phone] or if no answer before 7:00PM call [phone] For any issues after 7:00PM please call eLink [phone]

## 2023-03-31 NOTE — Discharge Instructions (Signed)
Flexible Bronchoscopy, Care After This sheet gives you information about how to care for yourself after your test. Your doctor may also give you more specific instructions. If you have problems or questions, contact your doctor. Follow these instructions at home: Eating and drinking When your numbness is gone and your cough and gag reflexes have come back, you may: Eat only soft foods. Slowly drink liquids. The day after the test, go back to your normal diet. Driving Do not drive for 24 hours if you were given a medicine to help you relax (sedative). Do not drive or use heavy machinery while taking prescription pain medicine. General instructions  Take over-the-counter and prescription medicines only as told by your doctor. Return to your normal activities as told. Ask what activities are safe for you. Do not use any products that have nicotine or tobacco in them. This includes cigarettes and e-cigarettes. If you need help quitting, ask your doctor. Keep all follow-up visits as told by your doctor. This is important. It is very important if you had a tissue sample (biopsy) taken. Get help right away if: You have shortness of breath that gets worse. You get light-headed. You feel like you are going to pass out (faint). You have chest pain. You cough up: More than a little blood. More blood than before. Summary Do not eat or drink anything (not even water) for 2 hours after your test, or until your numbing medicine wears off. Do not use cigarettes. Do not use e-cigarettes. Get help right away if you have chest pain.  Please call our office for any questions or concerns.  [phone].  This information is not intended to replace advice given to you by your health care provider. Make sure you discuss any questions you have with your health care provider. Document Released: 03/30/2009 Document Revised: 05/15/2017 Document Reviewed: 06/20/2016 Elsevier Patient Education  2020 [employer].

## 2023-03-31 NOTE — Interval H&P Note (Signed)
History and Physical Interval Note:  03/31/2023 10:29 AM  Barbara Jackson  has presented today for surgery, with the diagnosis of HYLER ADENOPATHY  PULMONARY NODULES.  The various methods of treatment have been discussed with the patient and family. After consideration of risks, benefits and other options for treatment, the patient has consented to  Procedure(s): ROBOTIC ASSISTED NAVIGATIONAL BRONCHOSCOPY (Bilateral) ENDOBRONCHIAL ULTRASOUND (Bilateral) as a surgical intervention.  The patient's history has been reviewed, patient examined, no change in status, stable for surgery.  I have reviewed the patient's chart and labs.  Questions were answered to the patient's satisfaction.     Leslye Peer

## 2023-04-01 ENCOUNTER — Encounter: Payer: Self-pay | Admitting: *Deleted

## 2023-04-01 ENCOUNTER — Telehealth: Payer: Self-pay | Admitting: Emergency Medicine

## 2023-04-01 ENCOUNTER — Encounter: Payer: Self-pay | Admitting: Hematology and Oncology

## 2023-04-01 ENCOUNTER — Telehealth: Payer: Self-pay | Admitting: *Deleted

## 2023-04-01 ENCOUNTER — Telehealth: Payer: Self-pay | Admitting: Pulmonary Disease

## 2023-04-01 MED ORDER — DOXYCYCLINE HYCLATE 100 MG PO TABS: 100.0000 mg | ORAL_TABLET | Freq: Two times a day (BID) | ORAL | 0 refills | Status: DC

## 2023-04-01 NOTE — Telephone Encounter (Signed)
Received call from pt stating she would like to receive all her care with [facility]. Pt requesting PA removed from her insurance company for our facility, RN sent message to PA team.

## 2023-04-01 NOTE — Anesthesia Postprocedure Evaluation (Signed)
Anesthesia Post Note  Patient: Barbara Jackson  Procedure(s) Performed: ROBOTIC ASSISTED NAVIGATIONAL BRONCHOSCOPY (Bilateral) ENDOBRONCHIAL ULTRASOUND (Bilateral) BRONCHIAL BRUSHINGS BRONCHIAL NEEDLE ASPIRATION BIOPSIES FINE NEEDLE ASPIRATION BIOPSY     Patient location during evaluation: PACU Anesthesia Type: General Level of consciousness: awake and alert Pain management: pain level controlled Vital Signs Assessment: post-procedure vital signs reviewed and stable Respiratory status: spontaneous breathing, nonlabored ventilation and respiratory function stable Cardiovascular status: blood pressure returned to baseline and stable Postop Assessment: no apparent nausea or vomiting Anesthetic complications: no   No notable events documented.  Last Vitals:  Vitals:   03/31/23 1400 03/31/23 1415  BP: 121/68 124/62  Pulse: (!) 59 61  Resp: 12 13  Temp:  (!) 36.2 C  SpO2: 96% 97%    Last Pain:  Vitals:   03/31/23 1022  TempSrc:   PainSc: 0-No pain   Pain Goal:                   [name]

## 2023-04-01 NOTE — Telephone Encounter (Signed)
Called answering service and pt is she is feeling funny today after procedure from yesterday

## 2023-04-01 NOTE — Telephone Encounter (Signed)
I called and spoke with the pt  She had bronch yesterday with Dr. Delton Coombes  She states having some cough and fatigue today  She felt ok when she woke up, but as the day goes on ger symptoms have progressed  She denies any fever, aches, hemoptysis, SOB  She has noticed very minimal wheezing  She is concerned that she is starting her cancer tx on 04/03/23  I advised rest and stay hydrated  Will route to Dr Delton Coombes  Please advise, thanks!

## 2023-04-01 NOTE — Telephone Encounter (Signed)
Spoke with the patient - has cough, feels fatigued and wiped out like she is coming down with something.   Reasonable to treat her for possible evolving bronchitis > will call in doxy x 7 days

## 2023-04-01 NOTE — Telephone Encounter (Addendum)
Called answering service and pt is she is feeling funny today after procedure from yesterday

## 2023-04-02 ENCOUNTER — Ambulatory Visit: Payer: Medicare PPO

## 2023-04-02 ENCOUNTER — Ambulatory Visit: Payer: Medicare PPO | Admitting: Adult Health

## 2023-04-02 ENCOUNTER — Other Ambulatory Visit: Payer: Medicare PPO

## 2023-04-02 ENCOUNTER — Institutional Professional Consult (permissible substitution): Payer: Medicare PPO | Admitting: Pulmonary Disease

## 2023-04-02 ENCOUNTER — Encounter (HOSPITAL_COMMUNITY): Payer: Self-pay | Admitting: Emergency Medicine

## 2023-04-03 DIAGNOSIS — C50411 Malignant neoplasm of upper-outer quadrant of right female breast: Secondary | ICD-10-CM | POA: Diagnosis not present

## 2023-04-03 DIAGNOSIS — Z170 Estrogen receptor positive status [ER+]: Secondary | ICD-10-CM | POA: Diagnosis not present

## 2023-04-03 NOTE — Telephone Encounter (Signed)
Please refer to documentation in separate encounter by Dr. Delton Coombes.

## 2023-04-04 ENCOUNTER — Ambulatory Visit: Payer: Medicare PPO

## 2023-04-05 DIAGNOSIS — C50411 Malignant neoplasm of upper-outer quadrant of right female breast: Secondary | ICD-10-CM | POA: Diagnosis not present

## 2023-04-05 DIAGNOSIS — Z170 Estrogen receptor positive status [ER+]: Secondary | ICD-10-CM | POA: Diagnosis not present

## 2023-04-06 DIAGNOSIS — I491 Atrial premature depolarization: Secondary | ICD-10-CM | POA: Diagnosis not present

## 2023-04-06 LAB — CYTOLOGY - NON PAP

## 2023-04-07 ENCOUNTER — Ambulatory Visit: Payer: Medicare PPO | Admitting: Acute Care

## 2023-04-07 ENCOUNTER — Encounter: Payer: Self-pay | Admitting: Acute Care

## 2023-04-07 VITALS — BP 124/74 | HR 73 | Temp 98.0°F | Ht 65.0 in | Wt 218.4 lb

## 2023-04-07 DIAGNOSIS — C50919 Malignant neoplasm of unspecified site of unspecified female breast: Secondary | ICD-10-CM

## 2023-04-07 DIAGNOSIS — Z9889 Other specified postprocedural states: Secondary | ICD-10-CM | POA: Diagnosis not present

## 2023-04-07 NOTE — Progress Notes (Signed)
History of Present Illness Barbara Jackson is a 67 y.o. female with PMH of diabetes, GERD with a hiatal hernia, hypertension, obesity with OSA, and new diagnosis of breast cancer followed by Dr. Pamelia Hoit.  She had a CT chest abdomen pelvis done 03/02/2023 with adenopathy concerning for metastatic disease . She was referred to Dr. Delton Coombes for consideration of bronchoscopy with biopsies to better evaluate these findings.    Synopsis 67 year old female with PMH of Small right hilar lymph node approximately 10 mm as well as scattered small pulmonary nodules, largest appears to be 7 mm on the left. She has a PET scan ordered. Certainly concerning for possible metastatic disease although we did discuss the possibility that there are other causes for pulmonary nodular disease. We weighed the pros and cons of navigational bronchoscopy and endobronchial ultrasound. I did explain that sensitivity will be lower in her case because the targets are small. For now she and Dr. Delton Coombes decided to perform robotic assisted navigational bronchoscopy and endobronchial ultrasound to try to get a tissue diagnosis. She is here today for follow up.   04/07/2023 Pt. Presents for follow up after bronchoscopy with biopsies on 03/31/2023 by Dr. Delton Coombes. She states she felt bad after her bronch and Dr. Delton Coombes started her on Doxycycline. She has almost completed treatment. She denied any bleeding post procedure. She did not have any discolored secretions. She denies any adverse reaction to anesthesia. She has already started treatment with [facility], Dr. Tawanna Sat oncology. Plan is for oral chemo. Letrozole which she has started and ribociclib ( kisqali), which she will start after insurance approves it.  . She started treatment Friday 04/03/2023.   Test Results: Cytology 03/31/2023 E. LYMPH NODE, 11R, FINE NEEDLE ASPIRATION:    FINAL MICROSCOPIC DIAGNOSIS:  FINAL MICROSCOPIC DIAGNOSIS:  A.  LEFT LUNG, LOWER LOBE, TARGET #1,  BRUSHING:  - No malignant cells identified  - Scant cellularity; scattered benign bronchial cells   B.  LEFT LUNG, LOWER LOBE, TARGET #1, FINE NEEDLE ASPIRATION:  - No malignant cells identified  - Scant cellularity; scattered benign bronchial cells and pulmonary  macrophages  - Minute fragment of alveolated lung and fibrovascular stroma   C.  LEFT LUNG, UPPER LOBE, BRUSHING:  - No malignant cells identified  - Benign bronchial cells and pulmonary macrophages  - Scant fibrovascular stroma   D.  LEFT LUNG, UPPER LOBE, FINE NEEDLE ASPIRATION:  - No malignant cells identified  - Benign bronchial cells, pulmonary macrophages and mixed inflammatory  cells  - Scant fibrovascular stroma   E. LYMPH NODE, 11R, FINE NEEDLE ASPIRATION:  - Malignant cells present  - Adenocarcinoma compatible with breast primary   CT chest abdomen pelvis 03/02/2023: FINDINGS: CT CHEST FINDINGS   Cardiovascular: No significant vascular findings. Normal heart size. No pericardial effusion.   Mediastinum/Nodes: Prominent RIGHT hilar node measures 10 mm (image 31/series 2). No supraclavicular adenopathy. No internal mammary adenopathy.   mass in the lateral RIGHT breast measures 2.6 cm. Enlarged RIGHT axillary lymph node measures 13 mm (image 21/series 2). Several prominent high axillary lymph nodes on image 14.   Lungs/Pleura: Multiple small nodules along the pleural fissures. For example 7 mm nodule on the LEFT on image 58/4. 4 mm nodule on the RIGHT on image 72/4.   Parenchymal nodule in the RIGHT middle lobe measures 5 mm on image 62/4. Probable pulmonary nodule nestled in the RIGHT hilum measures 8 mm on image 67/4. RIGHT lower lobe nodule measures 4  mm on image 100.   Musculoskeletal: No aggressive osseous lesion.   CT ABDOMEN AND PELVIS FINDINGS   Hepatobiliary: No focal hepatic lesion. No biliary ductal dilatation. Gallbladder is normal. Common bile duct is normal.   Pancreas: Pancreas  is normal. No ductal dilatation. No pancreatic inflammation.   Spleen: Normal spleen   Adrenals/urinary tract: Adrenal glands and kidneys are normal. The ureters and bladder normal.   Stomach/Bowel: Large sliding-type hiatal hernia with half the stomach above the diaphragm. Small bowel normal. Appendix not identified. The colon and rectosigmoid colon are normal.   Vascular/Lymphatic: Abdominal aorta is normal caliber. There is no retroperitoneal or periportal lymphadenopathy. No pelvic lymphadenopathy.   Reproductive: Uterus and adnexa unremarkable.   Other: No free fluid.   Musculoskeletal: No aggressive osseous lesion.   IMPRESSION: CHEST:   1. RIGHT breast mass consistent with primary breast carcinoma. 2. Enlarged RIGHT axillary lymph nodes consistent with nodal metastasis. 3. Prominent RIGHT hilar lymph node concerning for nodal metastasis. 4. Multiple small pulmonary nodules along the pleural fissures and several small parenchymal nodules concerning for pulmonary metastasis.   PELVIS:   1. No evidence of metastatic disease in the abdomen pelvis. 2. Large sliding-type hiatal hernia.        Latest Ref Rng & Units 02/18/2023    8:44 AM 01/12/2019    7:11 AM 01/11/2019    2:49 AM  CBC  WBC 4.0 - 10.5 K/uL 8.9  12.5  13.0   Hemoglobin 12.0 - 15.0 g/dL 09.8  11.9  14.7   Hematocrit 36.0 - 46.0 % 42.1  37.3  36.7   Platelets 150 - 400 K/uL 446  417  386        Latest Ref Rng & Units 02/18/2023    8:44 AM 02/06/2023    8:35 AM 12/31/2022    1:24 PM  BMP  Glucose 70 - 99 mg/dL 829  562  130   BUN 8 - 23 mg/dL 23  17  19    Creatinine 0.44 - 1.00 mg/dL 8.65  7.84  6.96   BUN/Creat Ratio 6 - 22 (calc)  SEE NOTE:  22   Sodium 135 - 145 mmol/L 139  137  139   Potassium 3.5 - 5.1 mmol/L 3.6  3.8  3.7   Chloride 98 - 111 mmol/L 101  102  97   CO2 22 - 32 mmol/L 27  23  24    Calcium 8.9 - 10.3 mg/dL 9.5  9.2  9.6     BNP No results found for: "BNP"  ProBNP No  results found for: "PROBNP"  PFT No results found for: "FEV1PRE", "FEV1POST", "FVCPRE", "FVCPOST", "TLC", "DLCOUNC", "PREFEV1FVCRT", "PSTFEV1FVCRT"  DG Chest Port 1 View  Result Date: 03/31/2023 CLINICAL DATA:  Status post bronchoscopy EXAM: PORTABLE CHEST 1 VIEW COMPARISON:  Chest x-ray 03/05/2023, CT 03/02/2023 FINDINGS: Right-sided central venous port tip over the SVC. Moderate hiatal hernia. Low lung volumes. No consolidation, pleural effusion or pneumothorax. Pulmonary nodules on CT are not well seen radiographically IMPRESSION: Low lung volumes. No pneumothorax. Electronically Signed   By: Jasmine Pang M.D.   On: 03/31/2023 16:12   DG C-ARM BRONCHOSCOPY  Result Date: 03/31/2023 C-ARM BRONCHOSCOPY: Fluoroscopy was utilized by the requesting physician.  No radiographic interpretation.   NM PET Image Initial (PI) Skull Base To Thigh  Result Date: 03/25/2023 CLINICAL DATA:  Initial treatment strategy for breast cancer. EXAM: NUCLEAR MEDICINE PET SKULL BASE TO THIGH TECHNIQUE: 12.12 mCi F-18 FDG was injected intravenously. Full-ring  PET imaging was performed from the skull base to thigh after the radiotracer. CT data was obtained and used for attenuation correction and anatomic localization. Fasting blood glucose: 134 mg/dl COMPARISON:  CT scan and whole-body bone scan 03/02/2023 FINDINGS: Mediastinal blood pool activity: SUV max 3.05 Liver activity: SUV max NA NECK: No hypermetabolic lymph nodes in the neck. Incidental CT findings: Choose 1 CHEST: The right lateral breast mass contains biopsy clips. SUV max is 9.44. Adjacent hypermetabolic lesion in the right pectoralis muscle has an SUV max of 8.67. Hypermetabolic axillary lymph nodes. The largest node measures 13 mm and SUV max is 5.61. Hypermetabolic hilar lymph nodes with SUV max of 5.94. Hypermetabolic left hilar node has an SUV max of 4.50. Multiple pulmonary nodules. 7 mm nodule in the left lower lobe is mildly hypermetabolic with SUV max  of 2.15. Findings highly suggestive of pulmonary metastatic disease. Loculated appearing slightly hyperdense fluid in the pleural space adjacent to the descending thoracic aorta. This demonstrates hypermetabolism with SUV max of 4.28. Could not exclude pleural disease. Incidental CT findings: Port-A-Cath in good position. Scattered aortic calcifications. Large hiatal hernia. ABDOMEN/PELVIS: No abnormal hypermetabolic activity within the liver, pancreas, adrenal glands, or spleen. No hypermetabolic lymph nodes in the abdomen or pelvis. Incidental CT findings: 17 mm rim calcified splenic artery aneurysm noted. SKELETON: Single small hypermetabolic focus in the posterior right acetabulum. SUV max is 5.59 and this is highly suspicious for a bone metastasis. Incidental CT findings: No lytic or sclerotic bone lesions on the CT scan. IMPRESSION: 1. Hypermetabolic right lateral breast mass consistent with primary breast neoplasm. Adjacent hypermetabolic lesion in the right pectoralis muscle. 2. Hypermetabolic right axillary and bilateral hilar lymph nodes consistent with metastatic disease. 3. Multiple bilateral pulmonary nodules consistent with pulmonary metastatic disease. 4. No findings for metastatic disease involving the abdomen/pelvis. 5. Single hypermetabolic focus in the posterior right acetabulum suggesting a bone metastasis. Aortic Atherosclerosis (ICD10-I70.0). Electronically Signed   By: Rudie Meyer M.D.   On: 03/25/2023 12:35     Past medical hx Past Medical History:  Diagnosis Date   Abnormal pap 1998   Negative Hpv   Anxiety    Arthritis    Breast cancer (HCC) 02/09/2023   right   [name], chronic    Cancer (HCC)    right breast IDC   Diabetes mellitus    Dysrhythmia    hx of atrial tachycardia and pacs   Esophageal reflux    GERD (gastroesophageal reflux disease)    H/O hiatal hernia    H/O iron deficiency anemia 08/2016   Hypercholesteremia    Hypertension    IDA (iron  deficiency anemia)    Obesity    compulsive overeater   Osteoarthritis    Sleep apnea    uses oral appliance   Splenic artery aneurysm (HCC)      Social History   Tobacco Use   Smoking status: Never   Smokeless tobacco: Never  Vaping Use   Vaping status: Never Used  Substance Use Topics   Alcohol use: Yes    Alcohol/week: 2.0 standard drinks of alcohol    Types: 2 Standard drinks or equivalent per week    Comment: occassionally   Drug use: No    Ms.Burgio reports that she has never smoked. She has never used smokeless tobacco. She reports current alcohol use of about 2.0 standard drinks of alcohol per week. She reports that she does not use drugs.  Tobacco Cessation: Never smoker  Past surgical hx, Family hx, Social hx all reviewed.  Current Outpatient Medications on File Prior to Visit  Medication Sig   albuterol (VENTOLIN HFA) 108 (90 Base) MCG/ACT inhaler Inhale 2 puffs into the lungs every 6 (six) hours as needed for wheezing or shortness of breath.   atorvastatin (LIPITOR) 20 MG tablet Take 20 mg by mouth daily with supper.    Blood Glucose Monitoring Suppl (ONE TOUCH ULTRA 2) w/Device KIT Use to check blood sugar 2-3 times a day   Continuous Glucose Sensor (FREESTYLE LIBRE 3 SENSOR) MISC 1 each by Does not apply route every 14 (fourteen) days.   diazepam (VALIUM) 5 MG tablet Take 5-10 mg by mouth every 8 (eight) hours as needed for anxiety.   diphenhydrAMINE (BENADRYL) 25 mg capsule Take 25 mg by mouth every 6 (six) hours as needed for allergies.    doxycycline (VIBRA-TABS) 100 MG tablet Take 1 tablet (100 mg total) by mouth 2 (two) times daily.   fluticasone (FLONASE) 50 MCG/ACT nasal spray Place 2 sprays into both nostrils daily as needed for allergies.   FREESTYLE TEST STRIPS test strip    glipiZIDE (GLUCOTROL XL) 5 MG 24 hr tablet Take 1 tablet (5 mg total) by mouth daily with breakfast.   Iron-FA-B Cmp-C-Biot-Probiotic (FUSION PLUS) CAPS Take 1 capsule by  mouth daily.   letrozole (FEMARA) 2.5 MG tablet Take 2.5 mg by mouth daily.   losartan-hydrochlorothiazide (HYZAAR) 100-25 MG per tablet Take 1 tablet by mouth every morning.   metFORMIN (GLUCOPHAGE-XR) 500 MG 24 hr tablet TAKE 4 TABLETS BY MOUTH DAILY WITH SUPPER.   omeprazole (PRILOSEC) 40 MG capsule Take 40 mg by mouth daily before breakfast.   potassium chloride (KLOR-CON) 10 MEQ tablet Take 10 mEq by mouth daily.   sertraline (ZOLOFT) 50 MG tablet Take 50 mg by mouth daily with supper.   tirzepatide (MOUNJARO) 12.5 MG/0.5ML Pen INJECT 12.5 MG SUBCUTANEOUSLY ONE TIME PER WEEK   Vitamin D, Ergocalciferol, (DRISDOL) 1.25 MG (50000 UNIT) CAPS capsule Take 1 capsule (50,000 Units total) by mouth every 7 (seven) days.   zolpidem (AMBIEN) 10 MG tablet Take 10 mg by mouth at bedtime as needed for sleep.    No current facility-administered medications on file prior to visit.     Allergies  Allergen Reactions   Diclofenac Other (See Comments)    GAS   Glucotrol [Glipizide] Nausea Only   Lisinopril Cough   Adhesive [Tape] Rash    Review Of Systems:  Constitutional:   No  weight loss, night sweats,  Fevers, chills, fatigue, or  lassitude.  HEENT:   No headaches,  Difficulty swallowing,  Tooth/dental problems, or  Sore throat,                No sneezing, itching, ear ache, nasal congestion, post nasal drip,   CV:  No chest pain,  Orthopnea, PND, swelling in lower extremities, anasarca, dizziness, palpitations, syncope.   GI  No heartburn, indigestion, abdominal pain, nausea, vomiting, diarrhea, change in bowel habits, loss of appetite, bloody stools.   Resp: No shortness of breath with exertion or at rest.  No excess mucus, no productive cough,  No non-productive cough,  No coughing up of blood.  No change in color of mucus.  No wheezing.  No chest wall deformity  Skin: no rash or lesions.  GU: no dysuria, change in color of urine, no urgency or frequency.  No flank pain, no hematuria    MS:  No joint pain  or swelling.  No decreased range of motion.  No back pain.  Psych:  No change in mood or affect. No depression or anxiety.  No memory loss.   Vital Signs Pulse 73   LMP 06/16/2005   SpO2 99% Comment: on RA   Physical Exam:  General- No distress,  A&Ox3 ENT: No sinus tenderness, TM clear, pale nasal mucosa, no oral exudate,no post nasal drip, no LAN Cardiac: S1, S2, regular rate and rhythm, no murmur Chest: No wheeze/ rales/ dullness; no accessory muscle use, no nasal flaring, no sternal retractions Abd.: Soft Non-tender, ND, BS +, Body mass index is 36.34 kg/m.  Ext: No clubbing cyanosis, edema Neuro:  normal strength, MAE x 4, A&O x 3 Skin: No rashes, warm and dry Psych: normal mood and behavior   Assessment/Plan Newly diagnosed breast cancer  CT Chest with adenopathy Post bronchoscopy with biopsies Adenocarcinoma compatible with breast primary  Plan I am glad you have done well since your procedure.  Your lung biopsies were negative, but the biopsy of the #11 Lymph node was positive for adenocarcinoma, primary breast. I am glad you have established with an oncologist at [facility].  They will take excellent care of you.  Follow up with [place] if you have any further upper respiratory issues.  I have printed out your results as requested.  Please contact office for sooner follow up if symptoms do not improve or worsen or seek emergency care    I spent 35 minutes dedicated to the care of this patient on the date of this encounter to include pre-visit review of records, face-to-face time with the patient discussing conditions above, post visit ordering of testing, clinical documentation with the electronic health record, making appropriate referrals as documented, and communicating necessary information to the patient's healthcare team.   Bevelyn Ngo, NP 04/07/2023  8:37 AM

## 2023-04-07 NOTE — Patient Instructions (Addendum)
It is good to see you today. I am glad you have done well since your procedure.  Your lung biopsies were negative, but the biopsy of the #11 Lymph node was positive for adenocarcinoma, primary breast. I am glad you have established with an oncologist at [facility].  They will take excellent care of you.  Follow up with [place] if you have any further upper respiratory issues.  I have printed out your results as requested.  Please contact office for sooner follow up if symptoms do not improve or worsen or seek emergency care

## 2023-04-08 ENCOUNTER — Encounter (INDEPENDENT_AMBULATORY_CARE_PROVIDER_SITE_OTHER): Payer: Self-pay | Admitting: Family Medicine

## 2023-04-08 ENCOUNTER — Ambulatory Visit (INDEPENDENT_AMBULATORY_CARE_PROVIDER_SITE_OTHER): Payer: Medicare PPO | Admitting: Family Medicine

## 2023-04-08 VITALS — BP 108/73 | HR 82 | Temp 98.1°F | Ht 65.0 in | Wt 212.0 lb

## 2023-04-08 DIAGNOSIS — E559 Vitamin D deficiency, unspecified: Secondary | ICD-10-CM | POA: Diagnosis not present

## 2023-04-08 DIAGNOSIS — E669 Obesity, unspecified: Secondary | ICD-10-CM | POA: Diagnosis not present

## 2023-04-08 DIAGNOSIS — E1165 Type 2 diabetes mellitus with hyperglycemia: Secondary | ICD-10-CM

## 2023-04-08 DIAGNOSIS — E119 Type 2 diabetes mellitus without complications: Secondary | ICD-10-CM

## 2023-04-08 DIAGNOSIS — Z6835 Body mass index (BMI) 35.0-35.9, adult: Secondary | ICD-10-CM

## 2023-04-08 DIAGNOSIS — Z7984 Long term (current) use of oral hypoglycemic drugs: Secondary | ICD-10-CM | POA: Diagnosis not present

## 2023-04-08 DIAGNOSIS — Z7985 Long-term (current) use of injectable non-insulin antidiabetic drugs: Secondary | ICD-10-CM

## 2023-04-08 DIAGNOSIS — Z170 Estrogen receptor positive status [ER+]: Secondary | ICD-10-CM

## 2023-04-08 MED ORDER — VITAMIN D (ERGOCALCIFEROL) 1.25 MG (50000 UNIT) PO CAPS: 50000.0000 [IU] | ORAL_CAPSULE | ORAL | 0 refills | Status: AC

## 2023-04-08 NOTE — Progress Notes (Signed)
[facility]  Office: [phone]  /  Fax: [phone]  WEIGHT SUMMARY AND BIOMETRICS  Anthropometric Measurements Height: 5\' 5"  (1.651 m) Weight: 212 lb (96.2 kg) BMI (Calculated): 35.28 Weight at Last Visit: 216 lb Weight Lost Since Last Visit: 4 lb Weight Gained Since Last Visit: 0 Starting Weight: 242 lb Total Weight Loss (lbs): 30 lb (13.6 kg) Peak Weight: 282 lb   Body Composition  Body Fat %: 45.7 % Fat Mass (lbs): 97 lbs Muscle Mass (lbs): 109.6 lbs Total Body Water (lbs): 77.8 lbs Visceral Fat Rating : 14   Other Clinical Data Fasting: no Labs: no Today's Visit #: 15 Starting Date: 12/04/21    Chief Complaint: OBESITY  History of Present Illness   The patient, diagnosed with type two diabetes and obesity, presents for a follow-up visit after a two-month interval. She reports a weight loss of four pounds during this period and adherence to a category three eating plan about 25% of the time. The patient engages in various forms of cardio exercise for twenty minutes, four times per week.  The patient was recently diagnosed with stage 4 breast cancer, with metastasis to the lung. The diagnosis was initially stage 3, but subsequent CT results revealed the metastasis.  The patient has started treatment with two oral medications, one antiestrogen and one chemotherapy drug. She has not yet started the chemotherapy drug due to insurance and specialty pharmacy issues. The patient reports no current need for infusions or surgery, as the new chemotherapy drugs, introduced in 2017, have revolutionized treatment and can be effective for years.  The patient has a regular counselor and plans to explore additional counseling resources, integrative therapies, and nutritional advice. She has been incorporating more dark purple fruits and vegetables into her diet, as recommended during her last visit. The patient reports occasional constipation, which may be related to the new  medications.  Emotionally, the patient acknowledges having bad days but states she is not extremely severe. She has a strong support network of family and friends, and she is learning to lean on different individuals for different needs. The patient is considering making radical changes to her diet, with a focus on adding more beneficial foods rather than eliminating all unhealthy ones.  She was restarted on glyburide for better glucose control of her DM II. She is aware of the potential for hypoglycemia with glyburide. She will continue to work on diet and exercises and healthy weight loss to help improve her cancer treatment.   The patient's sleep has been somewhat disrupted in the past week, primarily due to an active mind, but she has strategies in place to manage this. The patient's son, who lives in [place], plans to visit for a month, providing additional support.          PHYSICAL EXAM:  Blood pressure 108/73, pulse 82, temperature 98.1 F (36.7 C), height 5\' 5"  (1.651 m), weight 212 lb (96.2 kg), last menstrual period 06/16/2005, SpO2 98%. Body mass index is 35.28 kg/m.  DIAGNOSTIC DATA REVIEWED:  BMET    Component Value Date/Time   NA 139 02/18/2023 0844   NA 139 12/31/2022 1324   K 3.6 02/18/2023 0844   CL 101 02/18/2023 0844   CO2 27 02/18/2023 0844   GLUCOSE 181 (H) 02/18/2023 0844   BUN 23 02/18/2023 0844   BUN 19 12/31/2022 1324   CREATININE 0.90 02/18/2023 0844   CREATININE 0.77 02/06/2023 0835   CALCIUM 9.5 02/18/2023 0844   GFRNONAA >60 02/18/2023 4034  GFRAA >60 01/12/2019 0711   Lab Results  Component Value Date   HGBA1C 6.7 (A) 02/23/2023   HGBA1C 6.1 (A) 05/03/2018   Lab Results  Component Value Date   INSULIN 11.8 12/04/2021   Lab Results  Component Value Date   TSH 2.320 12/04/2021   CBC    Component Value Date/Time   WBC 8.9 02/18/2023 0844   WBC 12.5 (H) 01/12/2019 0711   RBC 5.00 02/18/2023 0844   HGB 14.2 02/18/2023 0844    HGB 12.9 12/24/2017 1556   HCT 42.1 02/18/2023 0844   HCT 38.6 12/24/2017 1556   PLT 446 (H) 02/18/2023 0844   PLT 504 (H) 12/24/2017 1556   MCV 84.2 02/18/2023 0844   MCV 82 12/24/2017 1556   MCH 28.4 02/18/2023 0844   MCHC 33.7 02/18/2023 0844   RDW 13.0 02/18/2023 0844   RDW 14.7 12/24/2017 1556   Iron Studies    Component Value Date/Time   FERRITIN 22 12/04/2017 1618   Lipid Panel     Component Value Date/Time   CHOL 174 08/13/2021 1041   TRIG 217.0 (H) 08/13/2021 1041   HDL 84.60 08/13/2021 1041   CHOLHDL 2 08/13/2021 1041   VLDL 43.4 (H) 08/13/2021 1041   LDLDIRECT 33.0 08/13/2021 1041   Hepatic Function Panel     Component Value Date/Time   PROT 7.2 02/18/2023 0844   PROT 7.1 12/31/2022 1324   ALBUMIN 4.3 02/18/2023 0844   ALBUMIN 4.4 12/31/2022 1324   AST 14 (L) 02/18/2023 0844   ALT 15 02/18/2023 0844   ALKPHOS 50 02/18/2023 0844   BILITOT 0.7 02/18/2023 0844      Component Value Date/Time   TSH 2.320 12/04/2021 0904   Nutritional Lab Results  Component Value Date   VD25OH 67.2 12/31/2022   VD25OH 27.6 (L) 12/04/2021     Assessment and Plan    Stage 4 Breast Cancer Newly diagnosed with metastasis to the lung. Currently on antiestrogen and oral chemotherapy. No current surgical options due to metastasis. Discussed the importance of consistent medication adherence and the potential benefits of a plant-based diet. -Continue current treatment regimen. -Consider counseling and support groups. -Explore integrative therapies and nutritional counseling.  Type 2 Diabetes Recent increase in HbA1c, resumed Glipizide in addition to ongoing Metformin and Mounjaro. Discussed the importance of consistent medication adherence and the potential benefits of a plant-based diet but also adding lean protein. -Continue current treatment regimen. -Monitor blood glucose levels regularly. -Consider nutritional counseling.  Obesity Lost 4 pounds in the past 2 months.  Following a category three eating plan 25% of the time and exercising 20 minutes, four times per week. Discussed the potential benefits of a plant-based diet and the importance of continued weight loss. -Continue current exercise regimen. -Continue category three eating plan and continue to work in antioxidant rich foods.   Vitamin D deficiency -Continue Vitamin D supplementation. -Schedule follow-up appointment in 3 months.       She was informed of the importance of frequent follow up visits to maximize her success with intensive lifestyle modifications for her multiple health conditions.    Quillian Quince, MD

## 2023-04-14 ENCOUNTER — Ambulatory Visit: Payer: Medicare PPO | Admitting: Hematology and Oncology

## 2023-04-17 ENCOUNTER — Other Ambulatory Visit: Payer: Medicare PPO

## 2023-04-17 ENCOUNTER — Ambulatory Visit: Payer: Medicare PPO

## 2023-04-17 ENCOUNTER — Ambulatory Visit: Payer: Medicare PPO | Admitting: Hematology and Oncology

## 2023-04-17 DIAGNOSIS — H8113 Benign paroxysmal vertigo, bilateral: Secondary | ICD-10-CM | POA: Diagnosis not present

## 2023-04-18 ENCOUNTER — Emergency Department (HOSPITAL_COMMUNITY): Payer: Medicare PPO

## 2023-04-18 ENCOUNTER — Other Ambulatory Visit: Payer: Self-pay

## 2023-04-18 ENCOUNTER — Emergency Department (HOSPITAL_COMMUNITY)
Admission: EM | Admit: 2023-04-18 | Discharge: 2023-04-18 | Disposition: A | Discharge status: Home / Self Care | Payer: Medicare PPO | Attending: Emergency Medicine | Admitting: Emergency Medicine

## 2023-04-18 ENCOUNTER — Encounter (HOSPITAL_COMMUNITY): Payer: Self-pay

## 2023-04-18 DIAGNOSIS — R0602 Shortness of breath: Secondary | ICD-10-CM | POA: Insufficient documentation

## 2023-04-18 DIAGNOSIS — N6310 Unspecified lump in the right breast, unspecified quadrant: Secondary | ICD-10-CM | POA: Diagnosis not present

## 2023-04-18 DIAGNOSIS — R739 Hyperglycemia, unspecified: Secondary | ICD-10-CM | POA: Diagnosis not present

## 2023-04-18 DIAGNOSIS — R61 Generalized hyperhidrosis: Secondary | ICD-10-CM | POA: Diagnosis not present

## 2023-04-18 DIAGNOSIS — I700 Atherosclerosis of aorta: Secondary | ICD-10-CM | POA: Diagnosis not present

## 2023-04-18 DIAGNOSIS — I951 Orthostatic hypotension: Secondary | ICD-10-CM | POA: Diagnosis not present

## 2023-04-18 DIAGNOSIS — R509 Fever, unspecified: Secondary | ICD-10-CM | POA: Diagnosis not present

## 2023-04-18 DIAGNOSIS — Z452 Encounter for adjustment and management of vascular access device: Secondary | ICD-10-CM | POA: Diagnosis not present

## 2023-04-18 DIAGNOSIS — E876 Hypokalemia: Secondary | ICD-10-CM | POA: Diagnosis not present

## 2023-04-18 DIAGNOSIS — I959 Hypotension, unspecified: Secondary | ICD-10-CM | POA: Insufficient documentation

## 2023-04-18 DIAGNOSIS — I445 Left posterior fascicular block: Secondary | ICD-10-CM | POA: Diagnosis not present

## 2023-04-18 DIAGNOSIS — B349 Viral infection, unspecified: Secondary | ICD-10-CM | POA: Diagnosis not present

## 2023-04-18 DIAGNOSIS — Z794 Long term (current) use of insulin: Secondary | ICD-10-CM | POA: Diagnosis not present

## 2023-04-18 DIAGNOSIS — C7981 Secondary malignant neoplasm of breast: Secondary | ICD-10-CM | POA: Diagnosis not present

## 2023-04-18 DIAGNOSIS — K449 Diaphragmatic hernia without obstruction or gangrene: Secondary | ICD-10-CM | POA: Diagnosis not present

## 2023-04-18 DIAGNOSIS — Z20822 Contact with and (suspected) exposure to covid-19: Secondary | ICD-10-CM | POA: Diagnosis not present

## 2023-04-18 DIAGNOSIS — R918 Other nonspecific abnormal finding of lung field: Secondary | ICD-10-CM | POA: Diagnosis not present

## 2023-04-18 DIAGNOSIS — R590 Localized enlarged lymph nodes: Secondary | ICD-10-CM | POA: Diagnosis not present

## 2023-04-18 LAB — COMPREHENSIVE METABOLIC PANEL
ALT: 19 U/L (ref 0–44)
AST: 23 U/L (ref 15–41)
Albumin: 3.6 g/dL (ref 3.5–5.0)
Alkaline Phosphatase: 42 U/L (ref 38–126)
Anion gap: 15 (ref 5–15)
BUN: 20 mg/dL (ref 8–23)
CO2: 20 mmol/L — ABNORMAL LOW (ref 22–32)
Calcium: 8.4 mg/dL — ABNORMAL LOW (ref 8.9–10.3)
Chloride: 95 mmol/L — ABNORMAL LOW (ref 98–111)
Creatinine, Ser: 1.16 mg/dL — ABNORMAL HIGH (ref 0.44–1.00)
GFR, Estimated: 52 mL/min — ABNORMAL LOW (ref >60)
Glucose, Bld: 208 mg/dL — ABNORMAL HIGH (ref 70–99)
Potassium: 2.6 mmol/L — CL (ref 3.5–5.1)
Sodium: 130 mmol/L — ABNORMAL LOW (ref 135–145)
Total Bilirubin: 0.9 mg/dL (ref 0.3–1.2)
Total Protein: 6.8 g/dL (ref 6.5–8.1)

## 2023-04-18 LAB — CBC WITH DIFFERENTIAL/PLATELET
Abs Immature Granulocytes: 0.05 10*3/uL (ref 0.00–0.07)
Basophils Absolute: 0 10*3/uL (ref 0.0–0.1)
Basophils Relative: 0 %
Eosinophils Absolute: 0.3 10*3/uL (ref 0.0–0.5)
Eosinophils Relative: 4 %
HCT: 44.6 % (ref 36.0–46.0)
Hemoglobin: 14.8 g/dL (ref 12.0–15.0)
Immature Granulocytes: 1 %
Lymphocytes Relative: 5 %
Lymphs Abs: 0.3 10*3/uL — ABNORMAL LOW (ref 0.7–4.0)
MCH: 29 pg (ref 26.0–34.0)
MCHC: 33.2 g/dL (ref 30.0–36.0)
MCV: 87.3 fL (ref 80.0–100.0)
Monocytes Absolute: 0.2 10*3/uL (ref 0.1–1.0)
Monocytes Relative: 2 %
Neutro Abs: 6.3 10*3/uL (ref 1.7–7.7)
Neutrophils Relative %: 88 %
Platelets: 331 10*3/uL (ref 150–400)
RBC: 5.11 MIL/uL (ref 3.87–5.11)
RDW: 13.4 % (ref 11.5–15.5)
WBC: 7.1 10*3/uL (ref 4.0–10.5)
nRBC: 0 % (ref 0.0–0.2)

## 2023-04-18 LAB — URINALYSIS, W/ REFLEX TO CULTURE (INFECTION SUSPECTED)
Bacteria, UA: NONE SEEN
Bilirubin Urine: NEGATIVE
Glucose, UA: 150 mg/dL — AB
Hgb urine dipstick: NEGATIVE
Ketones, ur: NEGATIVE mg/dL
Nitrite: NEGATIVE
Protein, ur: NEGATIVE mg/dL
Specific Gravity, Urine: 1.021 (ref 1.005–1.030)
pH: 5 (ref 5.0–8.0)

## 2023-04-18 LAB — I-STAT CG4 LACTIC ACID, ED
Lactic Acid, Venous: 3.5 mmol/L (ref 0.5–1.9)
Lactic Acid, Venous: 3.1 mmol/L (ref 0.5–1.9)

## 2023-04-18 LAB — SARS CORONAVIRUS 2 BY RT PCR: SARS Coronavirus 2 by RT PCR: NEGATIVE

## 2023-04-18 LAB — CBG MONITORING, ED: Glucose-Capillary: 243 mg/dL — ABNORMAL HIGH (ref 70–99)

## 2023-04-18 LAB — PROTIME-INR
INR: 1 (ref 0.8–1.2)
Prothrombin Time: 13.5 s (ref 11.4–15.2)

## 2023-04-18 LAB — D-DIMER, QUANTITATIVE: D-Dimer, Quant: 2.48 ug{FEU}/mL — ABNORMAL HIGH (ref 0.00–0.50)

## 2023-04-18 LAB — APTT: aPTT: 27 s (ref 24–36)

## 2023-04-18 MED ORDER — POTASSIUM CHLORIDE CRYS ER 20 MEQ PO TBCR
40.0000 meq | EXTENDED_RELEASE_TABLET | Freq: Once | ORAL | Status: AC
  Administered 2023-04-18: 40 meq via ORAL
  Filled 2023-04-18: qty 2

## 2023-04-18 MED ORDER — IOHEXOL 350 MG/ML SOLN
75.0000 mL | Freq: Once | INTRAVENOUS | Status: AC | PRN
  Administered 2023-04-18: 75 mL via INTRAVENOUS

## 2023-04-18 NOTE — Discharge Instructions (Signed)
You are seen in the emergency room for fevers and low blood pressure.  The workup in the emergency room is overall reassuring.  We did not see a source of infection and also have not identified any blood clots.  Your vital signs have been stable here.  We have sent your urine and blood for cultures.  If those results are positive, you will receive a phone call from our hospital.  We recommend that you hydrate well. Please return to the ER if your symptoms worsen; you have increased pain, fevers, chills, inability to keep any medications down, confusion. Otherwise see the outpatient doctor as requested.

## 2023-04-18 NOTE — ED Notes (Signed)
Patient said she cannot provide urine at this time.

## 2023-04-18 NOTE — ED Provider Notes (Signed)
[place] EMERGENCY DEPARTMENT AT [facility] Provider Note   CSN: [id number] Arrival date & time: 04/18/23  0901     History  Chief Complaint  Patient presents with   Fever    [place] is a 67 y.o. female.  HPI    67 year old female comes in with chief complaint of low blood pressure from the urgent care.  She had gone to the urgent care with chief complaint of fever.  Patient has history of metastatic breast cancer and is on oral chemotherapy.  She states that yesterday she started having a temp of 100 and 100.7.  She has generalized malaise.  Her care is managed at [facility].  She called the facility because the temp was over 100.4, and they advised that if she continues to have fever that she might want to come to the ER.  Patient finally decided to go to urgent care.  At the urgent care she was found to have low blood pressure and patient was rushed to the ER.  Patient denies any URI-like symptoms, sore throat, chest pain, cough, abdominal pain, nausea, vomiting, UTI-like symptoms.  She has no new rash.  She denies any severe headaches.  There is no history of PE, DVT.  Home Medications Prior to Admission medications   Medication Sig Start Date End Date Taking? Authorizing Provider  albuterol (VENTOLIN HFA) 108 (90 Base) MCG/ACT inhaler Inhale 2 puffs into the lungs every 6 (six) hours as needed for wheezing or shortness of breath.    [provider]  atorvastatin (LIPITOR) 20 MG tablet Take 20 mg by mouth daily with supper.  08/08/14   [provider]  Blood Glucose Monitoring Suppl (ONE TOUCH ULTRA 2) w/Device KIT Use to check blood sugar 2-3 times a day 05/12/18   Carlus Pavlov, MD  Continuous Glucose Sensor (FREESTYLE LIBRE 3 SENSOR) MISC 1 each by Does not apply route every 14 (fourteen) days. 02/23/23   Carlus Pavlov, MD  diazepam (VALIUM) 5 MG tablet Take 5-10 mg by mouth every 8 (eight) hours as needed for  anxiety.    [provider]  diphenhydrAMINE (BENADRYL) 25 mg capsule Take 25 mg by mouth every 6 (six) hours as needed for allergies.     [provider]  doxycycline (VIBRA-TABS) 100 MG tablet Take 1 tablet (100 mg total) by mouth 2 (two) times daily. 04/01/23   Leslye Peer, MD  fluticasone (FLONASE) 50 MCG/ACT nasal spray Place 2 sprays into both nostrils daily as needed for allergies.    [provider]  FREESTYLE TEST STRIPS test strip  07/06/14   [provider]  glipiZIDE (GLUCOTROL XL) 5 MG 24 hr tablet Take 1 tablet (5 mg total) by mouth daily with breakfast. 03/09/23   Carlus Pavlov, MD  Iron-FA-B Cmp-C-Biot-Probiotic (FUSION PLUS) CAPS Take 1 capsule by mouth daily.    [provider]  letrozole (FEMARA) 2.5 MG tablet Take 2.5 mg by mouth daily. 04/03/23 04/02/24  [provider]  losartan-hydrochlorothiazide (HYZAAR) 100-25 MG per tablet Take 1 tablet by mouth every morning.    [provider]  metFORMIN (GLUCOPHAGE-XR) 500 MG 24 hr tablet TAKE 4 TABLETS BY MOUTH DAILY WITH SUPPER. 04/28/22   Carlus Pavlov, MD  omeprazole (PRILOSEC) 40 MG capsule Take 40 mg by mouth daily before breakfast.    [provider]  potassium chloride (KLOR-CON) 10 MEQ tablet Take 10 mEq by mouth daily. 03/08/20   [provider]  sertraline (ZOLOFT) 50 MG tablet Take 50 mg by mouth daily with supper.    [provider]  tirzepatide Greggory Keen) 12.5 MG/0.5ML Pen INJECT 12.5 MG SUBCUTANEOUSLY ONE TIME PER WEEK 08/26/22   Carlus Pavlov, MD  Vitamin D, Ergocalciferol, (DRISDOL) 1.25 MG (50000 UNIT) CAPS capsule Take 1 capsule (50,000 Units total) by mouth every 7 (seven) days. 04/08/23   Quillian Quince D, MD  zolpidem (AMBIEN) 10 MG tablet Take 10 mg by mouth at bedtime as needed for sleep.     [provider]      Allergies    Diclofenac, Glucotrol [glipizide], Lisinopril, and Adhesive [tape]     Review of Systems   Review of Systems  All other systems reviewed and are negative.   Physical Exam Updated Vital Signs BP 125/81   Pulse 95   Temp 98.6 F (37 C) (Oral)   Resp 16   Ht 5\' 5"  (1.651 m)   Wt 98.9 kg   LMP 06/16/2005   SpO2 100%   BMI 36.28 kg/m  Physical Exam Vitals and nursing note reviewed.  Constitutional:      Appearance: She is well-developed.  HENT:     Head: Atraumatic.  Eyes:     Extraocular Movements: Extraocular movements intact.     Pupils: Pupils are equal, round, and reactive to light.  Cardiovascular:     Rate and Rhythm: Normal rate.  Pulmonary:     Effort: Pulmonary effort is normal.  Abdominal:     Tenderness: There is no abdominal tenderness.  Musculoskeletal:     Cervical back: Normal range of motion and neck supple.  Skin:    General: Skin is warm and dry.  Neurological:     Mental Status: She is alert and oriented to person, place, and time.     ED Results / Procedures / Treatments   Labs (all labs ordered are listed, but only abnormal results are displayed) Labs Reviewed  COMPREHENSIVE METABOLIC PANEL - Abnormal; Notable for the following components:      Result Value   Sodium 130 (*)    Potassium 2.6 (*)    Chloride 95 (*)    CO2 20 (*)    Glucose, Bld 208 (*)    Creatinine, Ser 1.16 (*)    Calcium 8.4 (*)    GFR, Estimated 52 (*)    All other components within normal limits  CBC WITH DIFFERENTIAL/PLATELET - Abnormal; Notable for the following components:   Lymphs Abs 0.3 (*)    All other components within normal limits  D-DIMER, QUANTITATIVE - Abnormal; Notable for the following components:   D-Dimer, Quant 2.48 (*)    All other components within normal limits  URINALYSIS, W/ REFLEX TO CULTURE (INFECTION SUSPECTED) - Abnormal; Notable for the following components:   APPearance HAZY (*)    Glucose, UA 150 (*)    Leukocytes,Ua MODERATE (*)    All other components within normal limits  CBG MONITORING, ED -  Abnormal; Notable for the following components:   Glucose-Capillary 243 (*)    All other components within normal limits  I-STAT CG4 LACTIC ACID, ED - Abnormal; Notable for the following components:   Lactic Acid, Venous 3.5 (*)    All other components within normal limits  I-STAT CG4 LACTIC ACID, ED - Abnormal; Notable for the following components:   Lactic Acid, Venous 3.1 (*)    All other components within normal limits  SARS CORONAVIRUS 2 BY RT PCR  CULTURE, BLOOD (ROUTINE X  2)  CULTURE, BLOOD (ROUTINE X 2)  PROTIME-INR  APTT    EKG EKG Interpretation Date/Time:  Saturday April 18 2023 09:25:47 EDT Ventricular Rate:  88 PR Interval:  142 QRS Duration:  90 QT Interval:  380 QTC Calculation: 460 R Axis:   98  Text Interpretation: Sinus rhythm Right axis deviation and [id number] No acute changes No significant change since last tracing Confirmed by Derwood Kaplan ([id number]) on 04/18/2023 11:18:02 AM  Radiology DG Chest Port 1 View  Result Date: 04/18/2023 CLINICAL DATA:  Questionable sepsis.  History of breast cancer. EXAM: PORTABLE CHEST 1 VIEW COMPARISON:  X-ray 03/31/2023.  CT angiogram earlier 04/18/2023. FINDINGS: Right IJ line in place with the tip along the central SVC. No consolidation, pneumothorax or effusion. No edema. Normal cardiopericardial silhouette. Calcified aorta. Overlapping cardiac leads. Known hiatal hernia. IMPRESSION: Chest port.  No acute cardiopulmonary disease.  Hiatal hernia. Electronically Signed   By: Karen Kays M.D.   On: 04/18/2023 12:09   CT Angio Chest PE W and/or Wo Contrast  Result Date: 04/18/2023 CLINICAL DATA:  Fever, history of breast cancer EXAM: CT ANGIOGRAPHY CHEST WITH CONTRAST TECHNIQUE: Multidetector CT imaging of the chest was performed using the standard protocol during bolus administration of intravenous contrast. Multiplanar CT image reconstructions and MIPs were obtained to evaluate the vascular anatomy. RADIATION DOSE REDUCTION:  This exam was performed according to the departmental dose-optimization program which includes automated exposure control, adjustment of the mA and/or kV according to patient size and/or use of iterative reconstruction technique. CONTRAST:  75mL OMNIPAQUE IOHEXOL 350 MG/ML SOLN COMPARISON:  PET-CT dated March 24, 2023 FINDINGS: Cardiovascular: No evidence of pulmonary embolus. Limited evaluation of the segmental and subsegmental pulmonary arteries due to bolus timing. normal heart size. No pericardial effusion. Normal caliber thoracic aorta with mild atherosclerotic disease. Mediastinum/Nodes: Large hiatal hernia. Thyroid is unremarkable. Enlarged right axillary and right hilar lymph nodes, unchanged when compared with the prior PET-CT. Lungs/Pleura: Central airways are patent. Numerous bilateral solid pulmonary nodules, unchanged when compared with the prior PET. No consolidation, pleural effusion or pneumothorax. Upper Abdomen: No acute abnormality. Musculoskeletal: Unchanged right breast mass. No aggressive appearing osseous lesions. Review of the MIP images confirms the above findings. IMPRESSION: 1. No evidence of pulmonary embolus. Limited evaluation of the segmental and subsegmental pulmonary arteries due to bolus timing. 2. Unchanged findings consistent with metastatic breast malignancy including right breast mass, enlarged right axillary hilar lymph nodes and bilateral solid pulmonary nodules. 3. Large hiatal hernia. 4. Aortic Atherosclerosis (ICD10-I70.0). Electronically Signed   By: Allegra Lai M.D.   On: 04/18/2023 11:56    Procedures Procedures    Medications Ordered in ED Medications  potassium chloride SA (KLOR-CON M) CR tablet 40 mEq (40 mEq Oral Given 04/18/23 1227)  iohexol (OMNIPAQUE) 350 MG/ML injection 75 mL (75 mLs Intravenous Contrast Given 04/18/23 1139)    ED Course/ Medical Decision Making/ A&P                                 Medical Decision Making Amount and/or  Complexity of Data Reviewed Labs: ordered. Radiology: ordered. ECG/medicine tests: ordered.  Risk Prescription drug management.   This patient presents to the ED with chief complaint(s) of fevers and hypotension with pertinent past medical history of metastatic breast cancer on oral chemotherapy.The complaint involves an extensive differential diagnosis and also carries with it a high risk of complications and morbidity.  The differential diagnosis includes : Occult infection, UTI/pneumonia, cellulitis, PE/DVT, cancer mediated fevers, medication side effects.  Patient is not toxic appearing in the ED.  Blood pressure is normal here.  No fevers here.  Patient has not taken any medications today.  The initial plan is to get basic labs including sepsis screening labs.  We will observe the patient in the ED. Urine culture, blood culture sent. We will evaluate to see if patient is neutropenic.  Additional history obtained: Records reviewed Care Everywhere/External Records  Independent labs interpretation:  The following labs were independently interpreted: Patient noted to have hypokalemia.  She has reassuring labs including negative COVID test, normal white count and UA that is reassuring.  CBC shows mild leukopenia but there is no neutropenia.  Patient has had positive urine but there is no significant pyuria or hematuria or bacteriuria.  Patient has no UTI symptoms.  D-dimer sent.  It is elevated.  Will get CT angio chest.  Independent visualization and interpretation of imaging: - I independently visualized the following imaging with scope of interpretation limited to determining acute life threatening conditions related to emergency care: X-ray of the chest, which revealed no evidence of pneumonia.  Treatment and Reassessment: Patient's initial lactic acid was 3.6.  Her repeat lactate however has improved.  We had not do any aggressive fluid resuscitation as patient was normotensive  in the ER and was nontoxic and asymptomatic.  Her creatinine is also normal.  CT PE ordered and is negative for blood clots.  Repeat lactate has improved.  Patient remains stable and nontoxic.  Findings discussed with her.  She is comfortable going home.  She has been made aware to come to the ER if her symptoms return.  Patient's potassium is 2.6.  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  Will give her 40 oral potassium here.  Final Clinical Impression(s) / ED Diagnoses Final diagnoses:  Viral illness  Fever, unknown origin  Hypokalemia    Rx / DC Orders ED Discharge Orders     None         Derwood Kaplan, MD 04/18/23 1442

## 2023-04-18 NOTE — ED Triage Notes (Signed)
Patient BIB [facility] from urgent care. Had a fever of 100.52F over the last 24 hours. Has stage 3 breast cancer. Began a new chemo medication 2 weeks ago. EMS stated blood pressure was 60/48 at urgent care. Urgent care gave of D5 through a 20G left AC. Patient is also diabetic.

## 2023-04-19 LAB — BLOOD CULTURE ID PANEL (REFLEXED) - BCID2

## 2023-04-20 ENCOUNTER — Ambulatory Visit: Payer: Medicare PPO

## 2023-04-20 LAB — CULTURE, BLOOD (ROUTINE X 2): Special Requests: ADEQUATE

## 2023-04-21 NOTE — Telephone Encounter (Signed)
Post ED Visit - Positive Culture Follow-up  Culture report reviewed by antimicrobial stewardship pharmacist: [facility] []  Enzo Bi, Pharm.D. []  Celedonio Miyamoto, Pharm.D., BCPS AQ-ID []  Garvin Fila, Pharm.D., BCPS []  Georgina Pillion, Pharm.D., BCPS []  [place], [place].D., BCPS, AAHIVP []  Estella Husk, Pharm.D., BCPS, AAHIVP []  Lysle Pearl, PharmD, BCPS []  Phillips Climes, PharmD, BCPS []  Agapito Games, PharmD, BCPS []  Verlan Friends, PharmD []  Mervyn Gay, PharmD, BCPS []  Vinnie Level, PharmD  [facility] [x]  Georgina Pillion, PharmD []  Greer Pickerel, PharmD []  Adalberto Cole, PharmD []  Perlie Gold, Rph []  Lonell Face) Jean Rosenthal, PharmD []  Earl Many, PharmD []  Junita Push, PharmD []  Dorna Leitz, PharmD []  Terrilee Files, PharmD []  Lynann Beaver, PharmD []  Keturah Barre, PharmD []  Loralee Pacas, PharmD []  Bernadene Person, PharmD   Positive blood culture Treated with no antibiotics Likely a contaminate . Only 1 of  4 blood cultures with bacillus. Patient reports feeling better and no fevers present. Patient is improving No additional follow-up needed Per Dr Rodena Medin ,04/21/2023, 9:04 AM

## 2023-04-21 NOTE — Progress Notes (Signed)
ED Antimicrobial Stewardship Positive Culture Follow Up   [place] is an 67 y.o. female who presented to [facility] on 04/18/2023 with a chief complaint of fever of 100.6 and hypotension when seen at urgent care.  The patient received a bolus of fluid at urgent care with hypotension resolved upon presentation to the [facility]. Also noted afebrile, WBC wnl, LA elevated at 3.5, but downtrended to 3.1.  The patient did have a PAC recently placed on 03/05/23 however no notable concerns mentioned at this site.  Only 1 of 4 blood cultures showing bacillus species - likely representative of a contaminant.   Plan - Call for symptom check ** If still having fevers or feeling poorly consider seeing PCP or oncologist for repeat blood cultures ** If improving and symptoms resolving - no additional follow-up needed  Discussed with ED provider: Kristine Royal, MD  Thank you for allowing pharmacy to be a part of this patient's care.  Georgina Pillion, PharmD, BCPS, BCIDP Infectious Diseases Clinical Pharmacist 04/21/2023 8:36 AM   **Pharmacist phone directory can now be found on [email or web address] ([email or web address]).  Listed under [facility].

## 2023-04-23 LAB — CULTURE, BLOOD (ROUTINE X 2)
Culture: NO GROWTH
Special Requests: ADEQUATE

## 2023-04-24 DIAGNOSIS — C50411 Malignant neoplasm of upper-outer quadrant of right female breast: Secondary | ICD-10-CM | POA: Diagnosis not present

## 2023-04-24 DIAGNOSIS — Z170 Estrogen receptor positive status [ER+]: Secondary | ICD-10-CM | POA: Diagnosis not present

## 2023-05-05 DIAGNOSIS — F4323 Adjustment disorder with mixed anxiety and depressed mood: Secondary | ICD-10-CM | POA: Diagnosis not present

## 2023-05-08 DIAGNOSIS — Z170 Estrogen receptor positive status [ER+]: Secondary | ICD-10-CM | POA: Diagnosis not present

## 2023-05-08 DIAGNOSIS — C50411 Malignant neoplasm of upper-outer quadrant of right female breast: Secondary | ICD-10-CM | POA: Diagnosis not present

## 2023-05-25 ENCOUNTER — Institutional Professional Consult (permissible substitution): Payer: Medicare PPO | Admitting: Pulmonary Disease

## 2023-06-03 DIAGNOSIS — R8290 Unspecified abnormal findings in urine: Secondary | ICD-10-CM | POA: Diagnosis not present

## 2023-06-03 DIAGNOSIS — T148XXA Other injury of unspecified body region, initial encounter: Secondary | ICD-10-CM | POA: Diagnosis not present

## 2023-06-03 DIAGNOSIS — R1032 Left lower quadrant pain: Secondary | ICD-10-CM | POA: Diagnosis not present

## 2023-06-03 DIAGNOSIS — I445 Left posterior fascicular block: Secondary | ICD-10-CM | POA: Diagnosis not present

## 2023-06-12 DIAGNOSIS — Z170 Estrogen receptor positive status [ER+]: Secondary | ICD-10-CM | POA: Diagnosis not present

## 2023-06-12 DIAGNOSIS — C50411 Malignant neoplasm of upper-outer quadrant of right female breast: Secondary | ICD-10-CM | POA: Diagnosis not present

## 2023-06-23 ENCOUNTER — Other Ambulatory Visit: Payer: Self-pay | Admitting: Internal Medicine

## 2023-06-23 NOTE — Telephone Encounter (Signed)
 Metformin refill request complete

## 2023-07-01 ENCOUNTER — Ambulatory Visit: Payer: Medicare PPO | Admitting: Internal Medicine

## 2023-07-01 ENCOUNTER — Encounter: Payer: Self-pay | Admitting: Internal Medicine

## 2023-07-01 VITALS — BP 122/70 | HR 72 | Ht 65.0 in | Wt 211.0 lb

## 2023-07-01 DIAGNOSIS — Z7985 Long-term (current) use of injectable non-insulin antidiabetic drugs: Secondary | ICD-10-CM | POA: Diagnosis not present

## 2023-07-01 DIAGNOSIS — Z7984 Long term (current) use of oral hypoglycemic drugs: Secondary | ICD-10-CM | POA: Diagnosis not present

## 2023-07-01 DIAGNOSIS — E785 Hyperlipidemia, unspecified: Secondary | ICD-10-CM

## 2023-07-01 DIAGNOSIS — E1165 Type 2 diabetes mellitus with hyperglycemia: Secondary | ICD-10-CM | POA: Diagnosis not present

## 2023-07-01 LAB — POCT GLYCOSYLATED HEMOGLOBIN (HGB A1C): Hemoglobin A1C: 5.9 % — AB (ref 4.0–5.6)

## 2023-07-01 NOTE — Progress Notes (Signed)
 Patient ID: DALEY RIGGAN, female   DOB: 10-10-1955, 68 y.o.   MRN: [id number]   HPI: OLEATHA LUBERDA is a 68 y.o.-year-old female, returning for follow-up for DM2, dx in 2007, non-insulin -dependent, now  controlled, without long-term complications.  Last visit 4 months ago.  Interim history: Before last visit, she was diagnosed with breast cancer stage 3 >> now stage 4. 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  Because of this, she will not have chemotherapy and could not participate in a trial at [facility]. On Letrozole and Ribociclib. No chemotherapy, no Dexamethasone . No increased urination, blurry vision, nausea, chest pain.  Reviewed HbA1c levels: 03/11/2023: HbA1c 6.9% Lab Results  Component Value Date   HGBA1C 6.7 (A) 02/23/2023   HGBA1C 6.4 (A) 09/01/2022   HGBA1C 6.3 (A) 04/28/2022   HGBA1C 6.6 (H) 12/04/2021   HGBA1C 6.4 (A) 02/11/2021   HGBA1C 6.1 (A) 09/19/2020   HGBA1C 6.3 (A) 05/16/2020   HGBA1C 6.1 (A) 08/23/2019   HGBA1C 6.4 (A) 04/25/2019   HGBA1C 6.2 (A) 12/20/2018  08/13/2021: HbA1c 6.9% 09/19/2020: HbA1c 6.1% 07/25/2020: HbA1c 6.4% 11/25/2019: HbA1c 6.6% 02/22/2018: HbA1c 7.0% 07/2017: HbA1c 7.7% 07/10/2016: HbA1c 7.0%  Pt is on a regimen of: - Metformin  ER 2000 mg after dinner - Glipizide  ER 5 mg before breakfast-added 02/2023 - Mounjaro  7.5 >> 10 >> 12.5 mg weekly We stopped Actos  08/17/2018. She tried Januvia, but stopped last year when she was on a new diet at that time and did not want to make any changes in her medicines. We stopped Ozempic  when changing to Mounjaro . She stopped glipizide  07/2022.  Pt checks her sugars  0 to once a day per review of her log: - am: 120-141, 147 >> 127-141 >> 107-132, 141 - 2h after b'fast:  149-160 >> 136, 151 >> 141, 145 >> 104 - before lunch: 115-130 >> 111-128 >> 131-154 >> 110 - 2h after lunch: 123-176, 194 >> 114-122, 153 >> 141-172 >> 147 - before dinner: 83-122, 153 >> 123-142, 189 >> 102, 122 - 2h  after dinner: 133-141>> 113-133 >> 119-135, 164 >> 147 - bedtime: 111>> 168, 240 (dessert) >> 124-179, 287 (dessert) >> n/c - nighttime: n/c >> 120, 133 >> n/c >> 131 >> n/c Lowest sugar was 111 >> 83>> 119 >> 102; she has hypoglycemia awareness in the 70s. Highest sugar was 201 >> 240 >> 287 >> 147  Glucometer: One Touch Ultra 2  In the past, she read Dr. Eloy Half program for reversing diabetes program started to adopt the changes suggested in the log.  Her sugars started to improve significantly.   She also saw Robynn Christian with nutrition in the past.  She was being seen in the binge eating clinic [facility].  However, not recently.  -No CKD, last BUN/creatinine:   Lab Results  Component Value Date   BUN 20 04/18/2023   BUN 23 02/18/2023   CREATININE 1.16 (H) 04/18/2023   CREATININE 0.90 02/18/2023  No components found for: "MICROALBCREAT" On losartan  100.  -+ HL;  last set of lipids: 09/03/2022: 148/177/76/46 Lab Results  Component Value Date   CHOL 174 08/13/2021   HDL 84.60 08/13/2021   LDLDIRECT 33.0 08/13/2021   TRIG 217.0 (H) 08/13/2021   CHOLHDL 2 08/13/2021  On Lipitor 40.  - last eye exam was 11/24/2022: No DR; + glaucoma suspect and she also has cataracts [facility], Dr. Gennie Kicks).   - no numbness and tingling in  her feet.  She sees podiatry-Dr. Celia Coles.  Last foot exam 09/01/2022.  Pt has FH of DM in MGM.  She also has depression-on Cymbalta, HTN, frequent UTIs and pelvic floor pain.  Previously on treatment of pain chronically. She saw several specialists but no clear diagnosis was made.  Symptoms resolved ~10/2020. She is on iron -it consistently.  ROS: + see HPI  I reviewed pt's medications, allergies, PMH, social hx, family hx, and changes were documented in the history of present illness. Otherwise, unchanged from my initial visit note.  Past Medical History:  Diagnosis Date   Abnormal pap 1998   Negative Hpv   Anxiety    Arthritis     Breast cancer (HCC) 02/09/2023   right   Cameron lesion, chronic    Cancer (HCC)    right breast IDC   Diabetes mellitus    Dysrhythmia    hx of atrial tachycardia and pacs   Esophageal reflux    GERD (gastroesophageal reflux disease)    H/O hiatal hernia    H/O iron deficiency anemia 08/2016   Hypercholesteremia    Hypertension    IDA (iron deficiency anemia)    Obesity    compulsive overeater   Osteoarthritis    Sleep apnea    uses oral appliance   Splenic artery aneurysm [facility])    Past Surgical History:  Procedure Laterality Date   APPENDECTOMY  1975   BRONCHIAL BRUSHINGS  03/31/2023   Procedure: BRONCHIAL BRUSHINGS;  Surgeon: Denson Flake, MD;  Location: [facility] ENDOSCOPY;  Service: Pulmonary;;   BRONCHIAL NEEDLE ASPIRATION BIOPSY  03/31/2023   Procedure: BRONCHIAL NEEDLE ASPIRATION BIOPSIES;  Surgeon: Denson Flake, MD;  Location: [facility] ENDOSCOPY;  Service: Pulmonary;;   ENDOBRONCHIAL ULTRASOUND Bilateral 03/31/2023   Procedure: ENDOBRONCHIAL ULTRASOUND;  Surgeon: Denson Flake, MD;  Location[facility] ENDOSCOPY;  Service: Pulmonary;  Laterality: Bilateral;   FINE NEEDLE ASPIRATION BIOPSY  03/31/2023   Procedure: FINE NEEDLE ASPIRATION BIOPSY;  Surgeon: Denson Flake, MD;  Location: [facility] ENDOSCOPY;  Service: Pulmonary;;   PORTACATH PLACEMENT Right 03/05/2023   Procedure: INSERTION PORT-A-CATH WITH ULTRASOUND Vicenta Graft;  Surgeon: Sim Dryer, MD;  Location: [facility];  Service: General;  Laterality: Right;   TOTAL KNEE ARTHROPLASTY  01/12/2012   Procedure: TOTAL KNEE ARTHROPLASTY;  Surgeon: Aurther Blue, MD;  Location: [facility] ORS;  Service: Orthopedics;  Laterality: Left;   TOTAL KNEE ARTHROPLASTY Right 01/10/2019   Procedure: TOTAL KNEE ARTHROPLASTY;  Surgeon: Liliane Rei, MD;  Location: [facility] ORS;  Service: Orthopedics;  Laterality: Right;    Social History   Socioeconomic History   Marital status: Divorced    Spouse name: Not on file   Number of  children: 1   Years of education: Not on file   Highest education level: Not on file  Occupational History    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   Lives alone.     Highest level of education:  One year graduate school   Current Outpatient Medications on  File Prior to Visit  Medication Sig Dispense Refill   albuterol  (VENTOLIN  HFA) 108 (90 Base) MCG/ACT inhaler Inhale 2 puffs into the lungs every 6 (six) hours as needed for wheezing or shortness of breath.     atorvastatin  (LIPITOR) 20 MG tablet Take 20 mg by mouth daily with supper.   0   Blood Glucose Monitoring Suppl (ONE TOUCH ULTRA 2) w/Device KIT Use to check blood sugar 2-3 times a day 1 each 0   Continuous Glucose Sensor (FREESTYLE LIBRE 3 SENSOR) MISC 1 each by Does not apply route every 14 (fourteen) days. 6 each 3   diazepam  (VALIUM ) 5 MG tablet Take 5-10 mg by mouth every 8 (eight) hours as needed for anxiety.     diphenhydrAMINE  (BENADRYL ) 25 mg capsule Take 25 mg by mouth every 6 (six) hours as needed for allergies.      doxycycline  (VIBRA -TABS) 100 MG tablet Take 1 tablet (100 mg total) by mouth 2 (two) times daily. 14 tablet 0   fluticasone (FLONASE) 50 MCG/ACT nasal spray Place 2 sprays into both nostrils daily as needed for allergies.     FREESTYLE TEST STRIPS test strip   5   glipiZIDE  (GLUCOTROL  XL) 5 MG 24 hr tablet Take 1 tablet (5 mg total) by mouth daily with breakfast. 90 tablet 2   Iron-FA-B Cmp-C-Biot-Probiotic (FUSION PLUS) CAPS Take 1 capsule by mouth daily.     letrozole (FEMARA) 2.5 MG tablet Take 2.5 mg  by mouth daily.     losartan -hydrochlorothiazide  (HYZAAR) 100-25 MG per tablet Take 1 tablet by mouth every morning.     metFORMIN  (GLUCOPHAGE -XR) 500 MG 24 hr tablet TAKE 4 TABLETS BY MOUTH EVERY DAY WITH SUPPER 360 tablet 3   omeprazole (PRILOSEC) 40 MG capsule Take 40 mg by mouth daily before breakfast.     potassium chloride  (KLOR-CON ) 10 MEQ tablet Take 10 mEq by mouth daily.     sertraline  (ZOLOFT ) 50 MG tablet Take 50 mg by mouth daily with supper.     tirzepatide  (MOUNJARO ) 12.5 MG/0.5ML Pen INJECT 12.5 MG SUBCUTANEOUSLY ONE TIME PER WEEK 6 mL 3   Vitamin D , Ergocalciferol , (DRISDOL ) 1.25 MG (50000 UNIT) CAPS capsule Take 1 capsule (50,000 Units total) by mouth every 7 (seven) days. 12 capsule 0   zolpidem  (AMBIEN ) 10 MG tablet Take 10 mg by mouth at bedtime as needed for sleep.      No current facility-administered medications on file prior to visit.   Allergies  Allergen Reactions   Diclofenac Other (See Comments)    GAS   Glucotrol  [Glipizide ] Nausea Only   Lisinopril Cough   Adhesive [Tape] Rash   Family History  Problem Relation Age of Onset   Breast cancer Mother 74   Heart attack Father        Deceased, 13   COPD Father    Prostate cancer Father 69 - 95   Obesity Sister    Hypertension Brother    Healthy Son    Breast cancer Cousin 22       maternal 1st cousin   Colon cancer Neg Hx    Stomach cancer Neg Hx    Esophageal cancer Neg Hx    PE: BP 122/70   Pulse 72   Ht 5\' 5"  (1.651 m)   Wt 211 lb (95.7 kg)   LMP 06/16/2005   SpO2 97%   BMI 35.11 kg/m  Wt Readings from Last 10 Encounters:  07/01/23 211 lb (95.7 kg)  04/18/23 218  lb (98.9 kg)  04/08/23 212 lb (96.2 kg)  04/07/23 218 lb 6.4 oz (99.1 kg)  03/31/23 218 lb (98.9 kg)  03/20/23 222 lb (100.7 kg)  03/05/23 222 lb 10.6 oz (101 kg)  02/24/23 221 lb 9.6 oz (100.5 kg)  02/23/23 219 lb 3.2 oz (99.4 kg)  02/18/23 221 lb (100.2 kg)   Constitutional: overweight, in NAD Eyes: EOMI, no  exophthalmos ENT: no thyromegaly, no cervical lymphadenopathy Cardiovascular: tRRR, No MRG Respiratory: CTA B Musculoskeletal: no deformities Skin: no rashes Neurological: + Very mild tremor with outstretched hands  ASSESSMENT: 1. DM2, non-insulin -dependent, now more controlled, without long-term complications, but with hyperglycemia  2. HL  3. Obesity class II  PLAN:  1. Patient with longstanding, previously uncontrolled type 2 diabetes, with improved control in the last 5 years.  She is on metformin  and GLP-1/GIP receptor agonist, to which we added back sulfonylurea at last visit.  At that time sugars were at goal or slightly above, but she was starting chemotherapy and we expect a higher blood sugars while on dexamethasone .  I did suggest glipizide  ER low-dose before breakfast.  We continued the rest of the regimen.  Her HbA1c was 6.7%, higher than before but she had another HbA1c obtained approximately 2 weeks later and that was even higher, at 6.9%. -At last visit, I suggested the CGM but she was not able to obtain it.  She tells me that we will need to send a preauthorization for it.  Will wait for the records from the pharmacy. -At today's visit, sugars are mostly at goal with very few mild hyperglycemic exceptions.  Overall, there is an improvement since last visit.  Her HbA1c is also better (see below), probably her best.  For now, since she does not have any low blood sugars, we can continue the same glipizide  dose along with the metformin  and Mounjaro .  She has no nausea or other GI side effects. - I suggested to:  Patient Instructions  Please continue: - Metformin  ER 2000 mg after dinner - Glipizide  ER 5 mg before b'fast  - Mounjaro  12.5 mg weekly  Please return in 4-6 months with your sugar log  - we checked her HbA1c: 5.9% - lowest in years! - advised to check sugars at different times of the day - 1x a day, rotating check times - advised for yearly eye exams >> she is  UTD - she has an annual physical exam coming up with PCP within the next 3 months - return to clinic in 4-6 months  2. HL -Latest lipid panel reviewed from 08/2022: LDL and HDL at goal, triglycerides mildly elevated: -She continues Lipitor 40 mg daily without side effects  3. Obesity class 2  -She was seen at [facility] for binge eating disorder, but not in the last few years -We changed from Ozempic  to Mounjaro  for stronger effect on weight.  She is on a higher dose, 12.5 mg weekly. -She is seen in the weight management clinic -She lost 25 pounds before the last 2 visits combined -She lost 8 lbs since last OV  Emilie Harden, MD PhD [facility] Endocrinology

## 2023-07-01 NOTE — Addendum Note (Signed)
 Addended by: Vernon Goodpasture on: 07/01/2023 09:12 AM   Modules accepted: Orders

## 2023-07-01 NOTE — Patient Instructions (Signed)
Please continue: - Metformin ER 2000 mg after dinner - Glipizide ER 5 mg before b'fast  - Mounjaro 12.5 mg weekly  Please return in 4-6 months with your sugar log

## 2023-07-02 ENCOUNTER — Other Ambulatory Visit: Payer: Self-pay

## 2023-07-15 DIAGNOSIS — Z131 Encounter for screening for diabetes mellitus: Secondary | ICD-10-CM | POA: Diagnosis not present

## 2023-07-15 DIAGNOSIS — C7951 Secondary malignant neoplasm of bone: Secondary | ICD-10-CM | POA: Diagnosis not present

## 2023-07-15 DIAGNOSIS — R937 Abnormal findings on diagnostic imaging of other parts of musculoskeletal system: Secondary | ICD-10-CM | POA: Diagnosis not present

## 2023-07-15 DIAGNOSIS — R918 Other nonspecific abnormal finding of lung field: Secondary | ICD-10-CM | POA: Diagnosis not present

## 2023-07-15 DIAGNOSIS — C50411 Malignant neoplasm of upper-outer quadrant of right female breast: Secondary | ICD-10-CM | POA: Diagnosis not present

## 2023-07-15 DIAGNOSIS — Z170 Estrogen receptor positive status [ER+]: Secondary | ICD-10-CM | POA: Diagnosis not present

## 2023-07-17 DIAGNOSIS — Z170 Estrogen receptor positive status [ER+]: Secondary | ICD-10-CM | POA: Diagnosis not present

## 2023-07-17 DIAGNOSIS — C50411 Malignant neoplasm of upper-outer quadrant of right female breast: Secondary | ICD-10-CM | POA: Diagnosis not present

## 2023-07-28 DIAGNOSIS — F4323 Adjustment disorder with mixed anxiety and depressed mood: Secondary | ICD-10-CM | POA: Diagnosis not present

## 2023-08-09 ENCOUNTER — Other Ambulatory Visit: Payer: Self-pay | Admitting: Internal Medicine

## 2023-08-09 DIAGNOSIS — E1165 Type 2 diabetes mellitus with hyperglycemia: Secondary | ICD-10-CM

## 2023-08-11 DIAGNOSIS — F4323 Adjustment disorder with mixed anxiety and depressed mood: Secondary | ICD-10-CM | POA: Diagnosis not present

## 2023-08-17 DIAGNOSIS — H9202 Otalgia, left ear: Secondary | ICD-10-CM | POA: Diagnosis not present

## 2023-08-17 DIAGNOSIS — J029 Acute pharyngitis, unspecified: Secondary | ICD-10-CM | POA: Diagnosis not present

## 2023-08-20 DIAGNOSIS — C50411 Malignant neoplasm of upper-outer quadrant of right female breast: Secondary | ICD-10-CM | POA: Diagnosis not present

## 2023-08-20 DIAGNOSIS — Z170 Estrogen receptor positive status [ER+]: Secondary | ICD-10-CM | POA: Diagnosis not present

## 2023-08-20 DIAGNOSIS — Z5111 Encounter for antineoplastic chemotherapy: Secondary | ICD-10-CM | POA: Diagnosis not present

## 2023-08-20 DIAGNOSIS — R5383 Other fatigue: Secondary | ICD-10-CM | POA: Diagnosis not present

## 2023-08-20 DIAGNOSIS — Z5181 Encounter for therapeutic drug level monitoring: Secondary | ICD-10-CM | POA: Diagnosis not present

## 2023-08-20 DIAGNOSIS — Z79811 Long term (current) use of aromatase inhibitors: Secondary | ICD-10-CM | POA: Diagnosis not present

## 2023-08-20 DIAGNOSIS — C771 Secondary and unspecified malignant neoplasm of intrathoracic lymph nodes: Secondary | ICD-10-CM | POA: Diagnosis not present

## 2023-08-20 DIAGNOSIS — C50919 Malignant neoplasm of unspecified site of unspecified female breast: Secondary | ICD-10-CM | POA: Diagnosis not present

## 2023-08-20 DIAGNOSIS — C7951 Secondary malignant neoplasm of bone: Secondary | ICD-10-CM | POA: Diagnosis not present

## 2023-08-26 DIAGNOSIS — J302 Other seasonal allergic rhinitis: Secondary | ICD-10-CM | POA: Diagnosis not present

## 2023-08-26 DIAGNOSIS — H9313 Tinnitus, bilateral: Secondary | ICD-10-CM | POA: Diagnosis not present

## 2023-09-14 DIAGNOSIS — F4329 Adjustment disorder with other symptoms: Secondary | ICD-10-CM | POA: Diagnosis not present

## 2023-09-15 ENCOUNTER — Other Ambulatory Visit: Payer: Self-pay

## 2023-09-17 DIAGNOSIS — D2239 Melanocytic nevi of other parts of face: Secondary | ICD-10-CM | POA: Diagnosis not present

## 2023-09-17 DIAGNOSIS — L821 Other seborrheic keratosis: Secondary | ICD-10-CM | POA: Diagnosis not present

## 2023-09-17 DIAGNOSIS — L218 Other seborrheic dermatitis: Secondary | ICD-10-CM | POA: Diagnosis not present

## 2023-09-21 DIAGNOSIS — Z0000 Encounter for general adult medical examination without abnormal findings: Secondary | ICD-10-CM | POA: Diagnosis not present

## 2023-09-21 DIAGNOSIS — E7800 Pure hypercholesterolemia, unspecified: Secondary | ICD-10-CM | POA: Diagnosis not present

## 2023-09-21 DIAGNOSIS — E1165 Type 2 diabetes mellitus with hyperglycemia: Secondary | ICD-10-CM | POA: Diagnosis not present

## 2023-09-21 DIAGNOSIS — D509 Iron deficiency anemia, unspecified: Secondary | ICD-10-CM | POA: Diagnosis not present

## 2023-09-21 DIAGNOSIS — E559 Vitamin D deficiency, unspecified: Secondary | ICD-10-CM | POA: Diagnosis not present

## 2023-09-24 DIAGNOSIS — C50919 Malignant neoplasm of unspecified site of unspecified female breast: Secondary | ICD-10-CM | POA: Diagnosis not present

## 2023-09-24 DIAGNOSIS — E7800 Pure hypercholesterolemia, unspecified: Secondary | ICD-10-CM | POA: Diagnosis not present

## 2023-09-24 DIAGNOSIS — I10 Essential (primary) hypertension: Secondary | ICD-10-CM | POA: Diagnosis not present

## 2023-09-24 DIAGNOSIS — E1165 Type 2 diabetes mellitus with hyperglycemia: Secondary | ICD-10-CM | POA: Diagnosis not present

## 2023-09-24 DIAGNOSIS — E876 Hypokalemia: Secondary | ICD-10-CM | POA: Diagnosis not present

## 2023-09-24 DIAGNOSIS — F418 Other specified anxiety disorders: Secondary | ICD-10-CM | POA: Diagnosis not present

## 2023-09-24 DIAGNOSIS — I728 Aneurysm of other specified arteries: Secondary | ICD-10-CM | POA: Diagnosis not present

## 2023-09-24 DIAGNOSIS — Z0000 Encounter for general adult medical examination without abnormal findings: Secondary | ICD-10-CM | POA: Diagnosis not present

## 2023-10-05 DIAGNOSIS — F4329 Adjustment disorder with other symptoms: Secondary | ICD-10-CM | POA: Diagnosis not present

## 2023-10-12 DIAGNOSIS — R918 Other nonspecific abnormal finding of lung field: Secondary | ICD-10-CM | POA: Diagnosis not present

## 2023-10-12 DIAGNOSIS — M7989 Other specified soft tissue disorders: Secondary | ICD-10-CM | POA: Diagnosis not present

## 2023-10-12 DIAGNOSIS — C50411 Malignant neoplasm of upper-outer quadrant of right female breast: Secondary | ICD-10-CM | POA: Diagnosis not present

## 2023-10-12 DIAGNOSIS — Z79899 Other long term (current) drug therapy: Secondary | ICD-10-CM | POA: Diagnosis not present

## 2023-10-12 DIAGNOSIS — Z9889 Other specified postprocedural states: Secondary | ICD-10-CM | POA: Diagnosis not present

## 2023-10-13 DIAGNOSIS — C50411 Malignant neoplasm of upper-outer quadrant of right female breast: Secondary | ICD-10-CM | POA: Diagnosis not present

## 2023-10-13 DIAGNOSIS — Z170 Estrogen receptor positive status [ER+]: Secondary | ICD-10-CM | POA: Diagnosis not present

## 2023-10-13 DIAGNOSIS — Z79811 Long term (current) use of aromatase inhibitors: Secondary | ICD-10-CM | POA: Diagnosis not present

## 2023-10-13 DIAGNOSIS — Z5181 Encounter for therapeutic drug level monitoring: Secondary | ICD-10-CM | POA: Diagnosis not present

## 2023-10-13 DIAGNOSIS — Z5111 Encounter for antineoplastic chemotherapy: Secondary | ICD-10-CM | POA: Diagnosis not present

## 2023-10-13 DIAGNOSIS — C7951 Secondary malignant neoplasm of bone: Secondary | ICD-10-CM | POA: Diagnosis not present

## 2023-10-13 DIAGNOSIS — C50919 Malignant neoplasm of unspecified site of unspecified female breast: Secondary | ICD-10-CM | POA: Diagnosis not present

## 2023-10-13 DIAGNOSIS — R5383 Other fatigue: Secondary | ICD-10-CM | POA: Diagnosis not present

## 2023-10-18 ENCOUNTER — Other Ambulatory Visit (INDEPENDENT_AMBULATORY_CARE_PROVIDER_SITE_OTHER): Payer: Self-pay | Admitting: Family Medicine

## 2023-10-18 DIAGNOSIS — E559 Vitamin D deficiency, unspecified: Secondary | ICD-10-CM

## 2023-10-19 ENCOUNTER — Encounter (INDEPENDENT_AMBULATORY_CARE_PROVIDER_SITE_OTHER): Payer: Self-pay | Admitting: Family Medicine

## 2023-10-26 ENCOUNTER — Encounter: Payer: Self-pay | Admitting: Podiatry

## 2023-10-26 ENCOUNTER — Ambulatory Visit: Admitting: Podiatry

## 2023-10-26 DIAGNOSIS — B353 Tinea pedis: Secondary | ICD-10-CM

## 2023-10-26 DIAGNOSIS — G588 Other specified mononeuropathies: Secondary | ICD-10-CM

## 2023-10-26 DIAGNOSIS — E1165 Type 2 diabetes mellitus with hyperglycemia: Secondary | ICD-10-CM | POA: Diagnosis not present

## 2023-10-26 NOTE — Progress Notes (Signed)
  Subjective:  Patient ID: Barbara Jackson, female    DOB: 1955-12-08,   MRN: [id number]  No chief complaint on file.   68 y.o. female presents for diabetic foot check. Has a few concerns including an intching burning spot on her left foot as well as pain in her second and third toes. Also has some questions about nail polish.  Relates burning and tingling in their feet. Patient is diabetic and last A1c was  Lab Results  Component Value Date   HGBA1C 5.9 (A) 07/01/2023   .   PCP:  Ronna Coho, MD    . Denies any other pedal complaints. Denies n/v/f/c.   Past Medical History:  Diagnosis Date   Abnormal pap 1998   Negative Hpv   Anxiety    Arthritis    Breast cancer (HCC) 02/09/2023   right   Donelda Fujita lesion, chronic    Cancer (HCC)    right breast IDC   Diabetes mellitus    Dysrhythmia    hx of atrial tachycardia and pacs   Esophageal reflux    GERD (gastroesophageal reflux disease)    H/O hiatal hernia    H/O iron deficiency anemia 08/2016   Hypercholesteremia    Hypertension    IDA (iron deficiency anemia)    Obesity    compulsive overeater   Osteoarthritis    Sleep apnea    uses oral appliance   Splenic artery aneurysm (HCC)     Objective:  Physical Exam: Vascular: DP/PT pulses 2/4 bilateral. CFT <3 seconds. Absent hair growth on digits. Edema noted to bilateral lower extremities. Xerosis noted bilaterally.  Skin. No lacerations or abrasions bilateral feet. Nails 1-5 bilateral  are noremal in appearacne. N omajor changes to skin  Musculoskeletal: MMT 5/5 bilateral lower extremities in DF, PF, Inversion and Eversion. Deceased ROM in DF of ankle joint. Tender to second interspace on left with positive mulders click. No pain to dorsum of foot.  Neurological: Sensation intact to light touch. Protective sensation intact bilateral.     Assessment:   1. Type 2 diabetes mellitus with hyperglycemia, without long-term current use of insulin  (HCC)   2. Tinea pedis of  left foot   3. Neuroma digital nerve      Plan:  Patient was evaluated and treated and all questions answered. -Discussed and educated patient on diabetic foot care, especially with  regards to the vascular, neurological and musculoskeletal systems.  -Stressed the importance of good glycemic control and the detriment of not  controlling glucose levels in relation to the foot. -Discussed supportive shoes at all times and checking feet regularly.  -Ketoconazole prescribed for itchiness and to rule out tinea.  -Discussed neuroma and treatment options with patient.  Discussed padding and offloading today. .  Discussed if pain does not improve may consider  MRI for further surgical planning.  Patient to return iin 1 year for DM foot check.    Jennefer Moats, DPM

## 2023-10-27 ENCOUNTER — Other Ambulatory Visit: Payer: Self-pay

## 2023-10-27 DIAGNOSIS — F4329 Adjustment disorder with other symptoms: Secondary | ICD-10-CM | POA: Diagnosis not present

## 2023-10-28 ENCOUNTER — Encounter: Payer: Self-pay | Admitting: Podiatry

## 2023-11-04 DIAGNOSIS — M545 Low back pain, unspecified: Secondary | ICD-10-CM | POA: Diagnosis not present

## 2023-11-05 ENCOUNTER — Other Ambulatory Visit: Payer: Self-pay | Admitting: Podiatry

## 2023-11-05 MED ORDER — KETOCONAZOLE 2 % EX CREA: 1.0000 | TOPICAL_CREAM | Freq: Every day | CUTANEOUS | 2 refills | Status: AC

## 2023-11-11 DIAGNOSIS — M545 Low back pain, unspecified: Secondary | ICD-10-CM | POA: Diagnosis not present

## 2023-11-12 DIAGNOSIS — Z170 Estrogen receptor positive status [ER+]: Secondary | ICD-10-CM | POA: Diagnosis not present

## 2023-11-12 DIAGNOSIS — C50411 Malignant neoplasm of upper-outer quadrant of right female breast: Secondary | ICD-10-CM | POA: Diagnosis not present

## 2023-11-12 DIAGNOSIS — Z1722 Progesterone receptor negative status: Secondary | ICD-10-CM | POA: Diagnosis not present

## 2023-11-12 DIAGNOSIS — Z1732 Human epidermal growth factor receptor 2 negative status: Secondary | ICD-10-CM | POA: Diagnosis not present

## 2023-11-12 NOTE — Progress Notes (Signed)
 Patient is alert and oriented at time of d/c.  VSS at time of d/c.  Patient tolerated tx well today.  Patient is ambulatory at d/c.

## 2023-11-12 NOTE — Progress Notes (Signed)
 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 female who presents for management of breast cancer.  Barbara Jackson self-palpated a lump in her right breast and sought imaging. She had a diagnostic mammogram and US  that showed dominant 4.4 x 6.2 x 4.7 cm mass highly suspicious for right breast mass in the upper outer quadrant. Abnormal prominent right axillary lymph nodes. There is a second smaller irregular high conspicuity enhancing mass posterior and lateral to the dominant mass measuring 2.5 x 1.3 x 1 cm. Prominent right axillary and possibly sub-pectoral nodes.  She underwent a right needle core biopsy on 02/10/23 that resulted invasive ductal carcinoma; grade 3; ER+(100%), PR-(0%) Her 2-(0), Ki67-25%. She sought care at [facility] where the Dr. Loretha recommended the following: Neoadjuvant Adriamycin, cyclophosphamide and paclitaxel followed by surgery followed by adjuvant radiation and antiestrogen therapy with CDK 4 6 inhibition.   She then underwent staging imaging at [facility] with a CT c/a/p and a CXR on 03/02/23 which revealed a right breast mass, enlarged right axillary lymph nodes, prominent right hilar lymph nodes consistent with nodal metastasis, and multiple small pulmonary nodules along the pleural fissures and several small parenchymal nodules concerning for pulmonary metastases. She underwent a bone scan on the same day which was unrevealing. She then underwent a PET scan at [facility] on 03/24/23 which showed a hypermetabolic right breast mass, a hypermetabolic lesion in the right pectoralis muscle, right axillary and bilateral hilar lymph nodes, multiple  pulmonary nodules, and a posterior right acetabulum lesion suggesting a bone metastasis.   She underwent a bronchoscopy and biopsies of her pulmonary nodules on 03/31/23 at [facility] with pathology confirming metastatic carcinoma of breast origin  Barbara Jackson has a history of diabetes, arthritis, hypercholesteremia, hypertension, sleep apnea, osteoarthritis, GERD, iron deficiency anemia, atrial tachycardia and pacs.  She is single with one child. She is retired from Consulting civil engineer at [employer]. She is seeking a second opinion for possible transfer of care.  Her mother had breast cancer at 75 as did a maternal first cousin. Her father had prostate cancer in his 30's. Her genetics were tested at [facility].  Initiated first line treatment for metastatic HR+ HER2- breast cancer with Letrozole 04/03/2023 and Ribociclib 600 mg PO D1-21/q28  04/08/2023.    Current Status:  I had the pleasure of seeing Barbara Jackson in clinic today. She is here unaccompanied.  She continues on letrozole and ribociclib and is tolerating them well overall. Has had some low back pain since a new exercise class and is now working with PT.  A Complete ROS was performed and negative if not mentioned above.  ECOG/Zubrod Performance Status: 0 - Fully active; no performance restrictions KPS (100-90%)   Past Medical History:  Diagnosis Date  . Cataract   . Diabetes mellitus    (CMD)   . Glaucoma suspect of both eyes   . High cholesterol   . Hypertension   . Joint pain    arthritis knees    Past Surgical History:  Procedure Laterality Date  . APPENDECTOMY     Procedure: APPENDECTOMY  . KNEE SURGERY Left    Procedure: KNEE SURGERY; 2014    Gynecologic  History:    Menopause: post-menopausal HRT: No   Social History   Socioeconomic History  . Marital status: Single    Spouse name: Not on file  . Number of children: Not on file  . Years of education: Not on file  . Highest education level: Not on file  Occupational  History  . Not on file  Tobacco Use  . Smoking status: Never  . Smokeless tobacco: Never  Vaping Use  . Vaping status: Never Used  Substance and Sexual Activity  . Alcohol  use: Yes  . Drug use: No  . Sexual activity: Not on file  Other Topics Concern  . Not on file  Social History Narrative  . Not on file   Social Drivers of Health   Food Insecurity: Low Risk  (08/26/2023)   Food vital sign   . Within the past 12 months, you worried that your food would run out before you got money to buy more: Never true   . Within the past 12 months, the food you bought just didn't last and you didn't have money to get more: Never true  Transportation Needs: No Transportation Needs (08/26/2023)   Transportation   . In the past 12 months, has lack of reliable transportation kept you from medical appointments, meetings, work or from getting things needed for daily living? : No  Safety: Low Risk  (11/12/2023)   Safety   . How often does anyone, including family and friends, physically hurt you?: Never   . How often does anyone, including family and friends, insult or talk down to you?: Never   . How often does anyone, including family and friends, threaten you with harm?: Never   . How often does anyone, including family and friends, scream or curse at you?: Never  Living Situation: Low Risk  (08/26/2023)   Living Situation   . What is your living situation today?: I have a steady place to live   . Think about the place you live. Do you have problems with any of the following? Choose all that apply:: None/None on this list    Family History  Problem Relation Name Age of Onset  . Cancer Mother    . Hypertension Sister    . Cataracts Maternal Grandmother    . Diabetes Maternal Grandmother    . Glaucoma Neg Hx    . Macular degeneration Neg Hx    . Retinal detachment Neg Hx    Mother-- Breast- 32 at diagnosis  Maternal cousin- breast Father- prostate  Allergies  Allergen Reactions  .  Diclofenac Other (See Comments)    GAS  . Glipizide  Other (See Comments)  . Lisinopril Cough  . Adhesive Rash    Meds Ordered in [place]  Medication Sig Dispense Refill  . albuterol  HFA (PROVENTIL  HFA;VENTOLIN  HFA;PROAIR  HFA) 90 mcg/actuation inhaler Inhale 1 puff every 4 (four) hours as needed.    . atorvastatin  (LIPITOR) 40 mg tablet Take  by mouth.    . cetirizine (ZyrTEC) 10 mg tablet Take  by mouth as needed.    . cholecalciferol (VITAMIN D3) 1,250 mcg (50,000 unit) capsule TAKE 1 CAPSULE BY MOUTH ONCE A WEEK FOR 90 DAYS    . diazePAM  (VALIUM ) 5 mg tablet Take 5 mg by mouth every 6 (six) hours as needed.    . diphenhydrAMINE  (BENADRYL ) 25 mg capsule Take  by mouth.    . ergocalciferol  (VITAMIN D2) 1,250 mcg (50,000 unit) capsule Take 50,000 Units by mouth once a week.    SABRA  fluticasone propionate (FLONASE) 50 mcg/spray nasal spray     . fluticasone propionate (FLONASE) 50 mcg/spray nasal spray Instill 2 puffs each nostril daily. 16 g 11  . glipiZIDE  (GLUCOTROL  XL) 5 mg 24 hr tablet Take 5 mg by mouth daily.    SABRA glucose blood (Precision Q-I-D Test) test strip     . iron fum,ps-FA-vit B,C#18-Lact (Fusion Plus) 130 mg iron -1,250 mcg cap Take 1 capsule by mouth daily.    . ketoconazole  (NIZORAL ) 2 % cream     . Klor-Con  10 10 mEq ER tablet 10 mEq Once Daily.    SABRA letrozole (FEMARA) 2.5 mg tablet Take 1 tablet (2.5 mg total) by mouth daily. Take with or without food. 90 tablet 3  . LORazepam  (ATIVAN ) 1 mg tablet as needed.    . losartan -hydroCHLOROthiazide  (HYZAAR) 100-25 mg per tablet Once Daily.    . meclizine (ANTIVERT) 25 mg tablet Take 25 mg by mouth 3 (three) times a day as needed. (Patient not taking: Reported on 11/12/2023)    . metFORMIN  (GLUCOPHAGE -XR) 500 mg 24 hr tablet 500 mg 4 (four) times a day.    . multivitamin with minerals (Daily Multivitamin-Minerals) tab Take  by mouth.    SABRA omeprazole magnesium (PriLOSEC OTC) 20 mg EC tablet Take  by mouth.    . potassium chloride   20 mEq ER tablet Take 20 mEq by mouth daily.    . ribociclib (KISQALI) 600 mg/day (200 mg x 3) chemo tablet Take 3 tablets (600 mg total) by mouth daily. Then take 7 days off. 63 tablet 6  . sertraline  (ZOLOFT ) 50 mg tablet TAKE 1 TABLET BY MOUTH EVERY DAY  5  . tirzepatide  (Mounjaro ) 12.5 mg/0.5 mL pnij Inject under the skin.    . zolpidem  (AMBIEN ) 10 mg tablet as needed.     Current Facility-Administered Medications Ordered in Epic  Medication Dose Route Frequency Provider Last Rate Last Admin  . dextrose  5 % in water  (D5W) infusion  500 mL/hr intravenous Once Rosaline Jama Collet, NP        Physical Exam: Vitals: BP: 111/61, Temp: 97.4 F (36.3 C), Temp Source: Temporal, Heart Rate: 66, Resp: 17, SpO2: 98 %, Height: 1.651 m (5' 5), Weight: 93 kg (205 lb); BMI (Calculated): 34.1  GENERAL: Well developed, well nourished, in no acute distress. HEENT: Head is normocephalic and atraumatic.  Pupils are equal, round and reactive to light.  Sclera are anicteric.  Conjunctiva without injection.  NECK: Soft and supple LYMPHATIC: No cervical, supraclavicular, infraclavicular or axillary adenopathy CARDIOVASCULAR: Regular rate and rhythm, no murmurs LUNGS: no increased work of breathing, CTAB ABDOMEN: Nondistended. No clinical ascites. EXTREMITIES: No clubbing, cyanosis or edema. SKIN: No bruises or rash. NEUROLOGIC: Alert and oriented x 3. No focal deficits PSYCH: Pleasant appropriate affect and normal thought content.       Results for orders placed or performed in visit on 11/12/23  CBC with Differential   Collection Time: 11/12/23  9:56 AM  Result Value Ref Range   WBC 5.30 4.40 - 11.00 10*3/uL   RBC 3.66 (L) 4.10 - 5.10 10*6/uL   Hemoglobin 12.1 (L) 12.3 - 15.3 g/dL   Hematocrit 65.1 (L) 64.0 - 44.6 %   Mean Corpuscular Volume (MCV) 95.2 80.0 - 96.0 fL   Mean Corpuscular Hemoglobin (MCH) 33.0 27.5 - 33.2 pg   Mean Corpuscular Hemoglobin Conc (MCHC) 34.7 33.0 - 37.0 g/dL   Red  Cell Distribution Width (RDW) 13.4 12.3 - 17.0 %   Platelet  Count (PLT) 415 150 - 450 10*3/uL   Mean Platelet Volume (MPV) 7.3 6.8 - 10.2 fL   Neutrophils % 54 %   Lymphocytes % 37 %   Monocytes % 4 %   Eosinophils % 3 %   Basophils % 1 %   Neutrophils Absolute 2.90 1.80 - 7.80 10*3/uL   Lymphocytes # 2.00 1.00 - 4.80 10*3/uL   Monocytes # 0.20 0.00 - 0.80 10*3/uL   Eosinophils # 0.20 0.00 - 0.50 10*3/uL   Basophils # 0.00 0.00 - 0.20 10*3/uL     Imaging:   Radiology Results (last 30 days)     ** No results found for the last 720 hours. **         Pathology:     Final Diagnosis    OUTSIDE SLIDE REVIEW ([facility], OUTSIDE CASE #: [id number] [phone], 1A, COLLECTED: 02/09/2023):   BREAST, RIGHT, 10 O'CLOCK, 7 CMFN, NEEDLE CORE BIOPSY:              Invasive carcinoma of no special type (ductal), grade 3 (of 3).              Lymphovascular invasion: present    Electronically signed by Toribio Keven Simple, MD on 03/02/2023 at 1345  Attestation Statement   I verify that I have personally reviewed all relevant slides/materials for this case and rendered or confirmed the diagnosis.  Comment   Breast biomarkers performed at the referring institution were not received for review were reported as follows (PER OUTSIDE REPORT):  Estrogen receptor:                    Positive (100%, strong intensity)  Progesterone receptor:             Negative (0%) Her2 (IHC):                                  Negative (0) Ki-67:                                          25%   Right axillary lymph node biopsy (slides not received) was also noted to have a diagnosis of invasive ductal carcinoma involving fibroadipose tissue, no residual lymph node tissue present.   Test Results: Cytology 03/31/2023 E. LYMPH NODE, 11R, FINE NEEDLE ASPIRATION:   FINAL MICROSCOPIC DIAGNOSIS:  FINAL MICROSCOPIC DIAGNOSIS:  A. LEFT LUNG, LOWER LOBE, TARGET #1, BRUSHING:  - No malignant cells identified  - Scant  cellularity; scattered benign bronchial cells   B. LEFT LUNG, LOWER LOBE, TARGET #1, FINE NEEDLE ASPIRATION:  - No malignant cells identified  - Scant cellularity; scattered benign bronchial cells and pulmonary  macrophages  - Minute fragment of alveolated lung and fibrovascular stroma   C. LEFT LUNG, UPPER LOBE, BRUSHING:  - No malignant cells identified  - Benign bronchial cells and pulmonary macrophages  - Scant fibrovascular stroma   D. LEFT LUNG, UPPER LOBE, FINE NEEDLE ASPIRATION:  - No malignant cells identified  - Benign bronchial cells, pulmonary macrophages and mixed inflammatory  cells  - Scant fibrovascular stroma   E. LYMPH NODE, 11R, FINE NEEDLE ASPIRATION:  - Malignant cells present  - Adenocarcinoma compatible with breast primary  EKG 05/08/2023: Qtc 380 ms  Reviewed today 12/27- normal Qtc   Assessment and Plan: Barbara Jackson presents for management of breast cancer.  De novo metastatic breast cancer (right breast and axillary adenopathy, hilar adenopathy; right acetabulum): Started first line treatment with letrozole and ribociclib Oct 2024. Started zometa  for osseous metastasis. PET scan Jan 2025 with partial response to therapy. Continues to tolerate treatment well with minimal side effects. Labs reviewed and appropriate. Pet prior to visit with stable disease Plan: - Continue ribociclib 600 mg 21d on, 7 d off - Continue letrozole - Continue Zometa  q 3 months - Continue PET monitoring q 3 months   Bone Health: Start Zometa  for osseous disease Nov 2024. - Recommended Calcium  1000-1200 mg daily and Vitamin D  1000-2000 IU daily for bone health.  - Continue Zometa  q3 months  Genetic counseling: Genetic results completed 03/01/23, no genetic alterations found.  Follow-up:  7/31- labs visit and treatment with PET prior  Patient has been instructed to call if they have any problems or concerns prior to their follow-up visit.    Electronically  signed by: Damien Vinita Berg, MD 11/12/2023 10:39 AM

## 2023-11-17 DIAGNOSIS — F4329 Adjustment disorder with other symptoms: Secondary | ICD-10-CM | POA: Diagnosis not present

## 2023-11-18 DIAGNOSIS — M545 Low back pain, unspecified: Secondary | ICD-10-CM | POA: Diagnosis not present

## 2023-11-23 DIAGNOSIS — H04123 Dry eye syndrome of bilateral lacrimal glands: Secondary | ICD-10-CM | POA: Diagnosis not present

## 2023-11-23 DIAGNOSIS — H524 Presbyopia: Secondary | ICD-10-CM | POA: Diagnosis not present

## 2023-11-23 DIAGNOSIS — E119 Type 2 diabetes mellitus without complications: Secondary | ICD-10-CM | POA: Diagnosis not present

## 2023-11-23 DIAGNOSIS — H40013 Open angle with borderline findings, low risk, bilateral: Secondary | ICD-10-CM | POA: Diagnosis not present

## 2023-11-23 DIAGNOSIS — H25813 Combined forms of age-related cataract, bilateral: Secondary | ICD-10-CM | POA: Diagnosis not present

## 2023-12-01 DIAGNOSIS — M545 Low back pain, unspecified: Secondary | ICD-10-CM | POA: Diagnosis not present

## 2023-12-14 ENCOUNTER — Encounter: Payer: Self-pay | Admitting: Internal Medicine

## 2023-12-22 ENCOUNTER — Ambulatory Visit: Admitting: Obstetrics and Gynecology

## 2023-12-22 VITALS — BP 126/78 | HR 67 | Wt 200.0 lb

## 2023-12-22 DIAGNOSIS — R3915 Urgency of urination: Secondary | ICD-10-CM | POA: Diagnosis not present

## 2023-12-22 DIAGNOSIS — M545 Low back pain, unspecified: Secondary | ICD-10-CM | POA: Diagnosis not present

## 2023-12-22 DIAGNOSIS — R300 Dysuria: Secondary | ICD-10-CM

## 2023-12-22 DIAGNOSIS — E2839 Other primary ovarian failure: Secondary | ICD-10-CM

## 2023-12-22 MED ORDER — NITROFURANTOIN MONOHYD MACRO 100 MG PO CAPS: 100.0000 mg | ORAL_CAPSULE | Freq: Two times a day (BID) | ORAL | 0 refills | Status: DC

## 2023-12-22 NOTE — Progress Notes (Signed)
   Acute Office Visit  Subjective:    Patient ID: Barbara Jackson, female    DOB: 07/09/1955, 68 y.o.   MRN: [id number]   HPI 68 y.o. presents today for Urinary Tract Infection (She is having frequency and urgency she did have some chills and almost pain this morning with urination. ) .x3 weeks. No fevers but did start to feel chills in both arms  MMG/MRI 2024: dx with breast cancer IDC with bone mets on letrazole Colonoscopy 2018: deciding on cologuard vs. Colonoscopy and will discuss with Dr. Nikki at annual but has been through a lot this past year. Dxa 2023 normal repeat ordered  Past Medical History:  Diagnosis Date   Abnormal pap 1998   Negative Hpv   Anxiety    Arthritis    Breast cancer (HCC) 02/09/2023   right   Ole lesion, chronic    Cancer (HCC)    right breast IDC   Diabetes mellitus    Dysrhythmia    hx of atrial tachycardia and pacs   Esophageal reflux    GERD (gastroesophageal reflux disease)    H/O hiatal hernia    H/O iron deficiency anemia 08/2016   Hypercholesteremia    Hypertension    IDA (iron deficiency anemia)    Obesity    compulsive overeater   Osteoarthritis    Sleep apnea    uses oral appliance   Splenic artery aneurysm (HCC)      Patient's last menstrual period was 06/16/2005.    Review of Systems     Objective:    OBGyn Exam  BP 126/78   Pulse 67   Wt 200 lb (90.7 kg)   LMP 06/16/2005   SpO2 100%   BMI 33.28 kg/m  Wt Readings from Last 3 Encounters:  12/22/23 200 lb (90.7 kg)  07/01/23 211 lb (95.7 kg)  04/18/23 218 lb (98.9 kg)        Assessment & Plan:  UTI UC sent. Denies allergies. Macrobid  sent.  Encouraged patient to return with any persistent or concerning symptoms  Barbara Jackson

## 2023-12-23 ENCOUNTER — Ambulatory Visit: Payer: Self-pay | Admitting: Obstetrics and Gynecology

## 2023-12-26 LAB — URINE CULTURE
MICRO NUMBER:: 16671026
SPECIMEN QUALITY:: ADEQUATE

## 2023-12-26 LAB — URINALYSIS, COMPLETE W/RFL CULTURE
Glucose, UA: NEGATIVE
Hgb urine dipstick: NEGATIVE
Hyaline Cast: NONE SEEN /LPF
Nitrites, Initial: NEGATIVE
RBC / HPF: NONE SEEN /HPF (ref 0–2)
Specific Gravity, Urine: 1.02 (ref 1.001–1.035)
pH: 5.5 (ref 5.0–8.0)

## 2023-12-26 LAB — CULTURE INDICATED

## 2023-12-29 ENCOUNTER — Ambulatory Visit: Payer: Medicare PPO | Admitting: Internal Medicine

## 2023-12-29 ENCOUNTER — Encounter: Payer: Self-pay | Admitting: Internal Medicine

## 2023-12-29 VITALS — BP 118/64 | HR 92 | Ht 65.0 in | Wt 200.2 lb

## 2023-12-29 DIAGNOSIS — Z7984 Long term (current) use of oral hypoglycemic drugs: Secondary | ICD-10-CM

## 2023-12-29 DIAGNOSIS — Z7985 Long-term (current) use of injectable non-insulin antidiabetic drugs: Secondary | ICD-10-CM | POA: Diagnosis not present

## 2023-12-29 DIAGNOSIS — E785 Hyperlipidemia, unspecified: Secondary | ICD-10-CM | POA: Diagnosis not present

## 2023-12-29 DIAGNOSIS — E1165 Type 2 diabetes mellitus with hyperglycemia: Secondary | ICD-10-CM

## 2023-12-29 LAB — POCT GLYCOSYLATED HEMOGLOBIN (HGB A1C): Hemoglobin A1C: 5.5 % (ref 4.0–5.6)

## 2023-12-29 MED ORDER — GLIPIZIDE ER 5 MG PO TB24: 5.0000 mg | ORAL_TABLET | Freq: Every day | ORAL | 3 refills | Status: AC

## 2023-12-29 NOTE — Progress Notes (Signed)
 Patient ID: Barbara Jackson, female   DOB: Oct 15, 1955, 68 y.o.   MRN: [id number]   HPI: Barbara Jackson is a 68 y.o.-year-old female, returning for follow-up for DM2, dx in 2007, non-insulin -dependent, now  controlled, without long-term complications.  Last visit 6 months ago.  Interim history: In 2024,, she was diagnosed with breast cancer stage 3 >> now stage 4. 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  Because of this, she  could not participate in a trial at [facility]. On Letrozole and Ribociclib. No chemotherapy, no Dexamethasone . + had increased urination (had a UTI last week), no blurry vision, nausea, chest pain. She does have fatigue.  Reviewed HbA1c levels: 09/21/2023: HbA1c 5.6% Lab Results  Component Value Date   HGBA1C 5.9 (A) 07/01/2023   HGBA1C 6.7 (A) 02/23/2023   HGBA1C 6.4 (A) 09/01/2022   HGBA1C 6.3 (A) 04/28/2022   HGBA1C 6.6 (H) 12/04/2021   HGBA1C 6.4 (A) 02/11/2021   HGBA1C 6.1 (A) 09/19/2020   HGBA1C 6.3 (A) 05/16/2020   HGBA1C 6.1 (A) 08/23/2019   HGBA1C 6.4 (A) 04/25/2019  03/11/2023: HbA1c 6.9% 08/13/2021: HbA1c 6.9% 09/19/2020: HbA1c 6.1% 07/25/2020: HbA1c 6.4% 11/25/2019: HbA1c 6.6% 02/22/2018: HbA1c 7.0% 07/2017: HbA1c 7.7% 07/10/2016: HbA1c 7.0%  Pt is on a regimen of: - Metformin  ER 2000 mg after dinner - Glipizide  ER 5 mg before breakfast-added 02/2023 - Mounjaro  7.5 >> 10 >> 12.5 mg weekly We stopped Actos  08/17/2018. She tried Januvia, but stopped last year when she was on a new diet at that time and did not want to make any changes in her medicines. We stopped Ozempic  when changing to Mounjaro . She stopped glipizide  07/2022.  Pt checks her sugars  0 to once a day per review of her log: - am: 120-141, 147 >> 127-141 >> 107-132, 141 >> 79-112, 137 - 2h after b'fast:  149-160 >> 136, 151 >> 141, 145 >> 104 >> 120-161 - before lunch: 115-130 >> 111-128 >> 131-154 >> 110 >> 105-134 - 2h after lunch: 123-176, 194 >> 114-122, 153 >> 141-172  >> 147 >> 102-192  - before dinner: 83-122, 153 >> 123-142, 189 >> 102, 122 >> 77, 81-128 - 2h after dinner: 133-141>> 113-133 >> 119-135, 164 >> 147 >> 92-147, 177 - bedtime:  168, 240 (dessert) >> 124-179, 287 (dessert) >> n/c >> 98, 134 - nighttime: n/c >> 120, 133 >> n/c >> 131 >> n/c Lowest sugar was 111 >> 83>> 119 >> 102 >> 77; she has hypoglycemia awareness in the 70s. Highest sugar was 201 >> 240 >> 287 >> 147 >> 192 (forgot Glipizide )  Glucometer: One Touch Ultra 2  In the past, she read Dr. Rosalynn Points program for reversing diabetes program started to adopt the changes suggested in the log.  Her sugars started to improve significantly.   She also saw [name] with nutrition in the past.  She was being seen in the binge eating clinic at [facility].  However, not recently.  -+ Worsening CKD, last BUN/creatinine:   09/21/2023: GFR 54 Lab Results  Component Value Date   BUN 20 04/18/2023   BUN 23 02/18/2023   CREATININE 1.16 (H) 04/18/2023   CREATININE 0.90 02/18/2023   No components found for: MICROALBCREAT On losartan  100.  -+ HL;  last set of lipids: 09/21/2023: 155/153/81/50 09/03/2022: 148/177/76/46 Lab Results  Component Value Date   CHOL 174 08/13/2021   HDL 84.60 08/13/2021   LDLDIRECT 33.0 08/13/2021   TRIG  217.0 (H) 08/13/2021   CHOLHDL 2 08/13/2021  On Lipitor 40.  - last eye exam was 2025: No DR; + glaucoma suspect and she also has cataracts (prev. [facility], Dr. Roz >> now Dr. Cleatus).   - no numbness and tingling in her feet.  She sees podiatry.  Last foot exam 10/26/2023 by Dr. Sikora. She had Morton's neuroma.  Pt has FH of DM in M[place].  She also has depression-on Cymbalta, HTN, frequent UTIs and pelvic floor pain.  Previously on treatment of pain chronically. She saw several specialists but no clear diagnosis was made.  Symptoms resolved ~10/2020. She is on iron -it consistently.  ROS: + see HPI  I reviewed pt's medications,  allergies, PMH, social hx, family hx, and changes were documented in the history of present illness. Otherwise, unchanged from my initial visit note.  Past Medical History:  Diagnosis Date   Abnormal pap 1998   Negative Hpv   Anxiety    Arthritis    Breast cancer (HCC) 02/09/2023   right   Cameron lesion, chronic    Cancer (HCC)    right breast IDC   Diabetes mellitus    Dysrhythmia    hx of atrial tachycardia and pacs   Esophageal reflux    GERD (gastroesophageal reflux disease)    H/O hiatal hernia    H/O iron deficiency anemia 08/2016   Hypercholesteremia    Hypertension    IDA (iron deficiency anemia)    Obesity    compulsive overeater   Osteoarthritis    Sleep apnea    uses oral appliance   Splenic artery aneurysm [facility])    Past Surgical History:  Procedure Laterality Date   APPENDECTOMY  1975   BRONCHIAL BRUSHINGS  03/31/2023   Procedure: BRONCHIAL BRUSHINGS;  Surgeon: Shelah Lamar RAMAN, MD;  Location: [facility] ENDOSCOPY;  Service: Pulmonary;;   BRONCHIAL NEEDLE ASPIRATION BIOPSY  03/31/2023   Procedure: BRONCHIAL NEEDLE ASPIRATION BIOPSIES;  Surgeon: Shelah Lamar RAMAN, MD;  Location: [facility] ENDOSCOPY;  Service: Pulmonary;;   ENDOBRONCHIAL ULTRASOUND Bilateral 03/31/2023   Procedure: ENDOBRONCHIAL ULTRASOUND;  Surgeon: Shelah Lamar RAMAN, MD;  Location: [facility] ENDOSCOPY;  Service: Pulmonary;  Laterality: Bilateral;   FINE NEEDLE ASPIRATION BIOPSY  03/31/2023   Procedure: FINE NEEDLE ASPIRATION BIOPSY;  Surgeon: Shelah Lamar RAMAN, MD;  Location: [facility] ENDOSCOPY;  Service: Pulmonary;;   PORTACATH PLACEMENT Right 03/05/2023   Procedure: INSERTION PORT-A-CATH WITH ULTRASOUND ANDREA;  Surgeon: Vanderbilt Ned, MD;  Location: [facility];  Service: General;  Laterality: Right;   TOTAL KNEE ARTHROPLASTY  01/12/2012   Procedure: TOTAL KNEE ARTHROPLASTY;  Surgeon: Dempsey LULLA Moan, MD;  Location: [facility] ORS;  Service: Orthopedics;  Laterality: Left;   TOTAL KNEE ARTHROPLASTY Right 01/10/2019    Procedure: TOTAL KNEE ARTHROPLASTY;  Surgeon: Moan Dempsey, MD;  Location: [facility] ORS;  Service: Orthopedics;  Laterality: Right;    Social History   Socioeconomic History   Marital status: Divorced    Spouse name: Not on file   Number of children: 1   Years of education: Not on file   Highest education level: Not on file  Occupational History    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   Lives alone.     Highest level of education:  One year graduate school   Current Outpatient Medications on File Prior to Visit  Medication Sig Dispense Refill   albuterol  (VENTOLIN  HFA) 108 (90 Base) MCG/ACT inhaler Inhale 2 puffs into the lungs every 6 (six) hours as needed for wheezing or shortness of breath.     atorvastatin  (LIPITOR) 20 MG tablet Take 20 mg by mouth daily with supper.   0   Blood Glucose Monitoring Suppl (ONE TOUCH ULTRA 2) w/Device KIT Use to check blood sugar 2-3 times a day 1 each 0   diazepam  (VALIUM ) 5 MG tablet Take 5-10 mg by mouth every 8 (eight) hours as needed for anxiety.     diphenhydrAMINE  (BENADRYL ) 25 mg capsule Take 25 mg by mouth every 6 (six) hours as needed for allergies.      fluticasone (FLONASE) 50 MCG/ACT nasal spray Place 2 sprays into both nostrils daily as needed for allergies.     FREESTYLE TEST STRIPS test strip   5   glipiZIDE  (GLUCOTROL  XL) 5 MG 24 hr tablet Take 1 tablet (5 mg total) by mouth daily with breakfast. 90 tablet 2   Iron-FA-B Cmp-C-Biot-Probiotic (FUSION PLUS) CAPS Take 1 capsule by mouth daily.     ketoconazole  (NIZORAL ) 2 % cream  Apply 1 Application topically daily. (Patient not taking: Reported on 12/22/2023) 60 g 2   letrozole (FEMARA) 2.5 MG tablet Take 2.5 mg by mouth daily.     losartan -hydrochlorothiazide  (HYZAAR) 100-25 MG per tablet Take 1 tablet by mouth every morning.     metFORMIN  (GLUCOPHAGE -XR) 500 MG 24 hr tablet TAKE 4 TABLETS BY MOUTH EVERY DAY WITH SUPPER 360 tablet 3   nitrofurantoin , macrocrystal-monohydrate, (MACROBID ) 100 MG capsule Take 1 capsule (100 mg total) by mouth 2 (two) times daily. 14 capsule 0   omeprazole (PRILOSEC) 40 MG capsule Take 40 mg by mouth daily before breakfast.     potassium chloride  (KLOR-CON ) 10 MEQ tablet Take 10 mEq by mouth daily.     Ribociclib Succinate (KISQALI 200 DOSE PO)      sertraline  (ZOLOFT ) 50 MG tablet Take 50 mg by mouth daily with supper.     tirzepatide  (MOUNJARO ) 12.5 MG/0.5ML Pen INJECT 12.5 MG SUBCUTANEOUSLY ONE TIME PER WEEK 6 mL 2   Vitamin D , Ergocalciferol , (DRISDOL ) 1.25 MG (50000 UNIT) CAPS capsule Take 1 capsule (50,000 Units total) by mouth every 7 (seven) days. 12 capsule 0   zolpidem  (AMBIEN ) 10 MG tablet Take 10 mg by mouth at bedtime as needed for sleep.      No current facility-administered medications on file prior to visit.   Allergies  Allergen Reactions   Diclofenac Other (See Comments)    GAS   Glucotrol  [Glipizide ] Nausea Only   Lisinopril Cough   Adhesive [Tape] Rash   Family History  Problem Relation Age of Onset   Breast cancer Mother 62   Heart attack Father        Deceased, 20   COPD Father    Prostate cancer Father 29 - 38   Obesity Sister    Hypertension Brother    Healthy Son    Breast cancer Cousin 8       maternal 1st cousin   Colon cancer Neg Hx    Stomach cancer Neg  Hx    Esophageal cancer Neg Hx    PE: BP 118/64   Pulse 92   Ht 5' 5 (1.651 m)   Wt 200 lb 3.2 oz (90.8 kg)   LMP 06/16/2005   SpO2 97%   BMI 33.32 kg/m  Wt Readings from Last 10 Encounters:  12/29/23 200 lb 3.2 oz (90.8 kg)   12/22/23 200 lb (90.7 kg)  07/01/23 211 lb (95.7 kg)  04/18/23 218 lb (98.9 kg)  04/08/23 212 lb (96.2 kg)  04/07/23 218 lb 6.4 oz (99.1 kg)  03/31/23 218 lb (98.9 kg)  03/20/23 222 lb (100.7 kg)  03/05/23 222 lb 10.6 oz (101 kg)  02/24/23 221 lb 9.6 oz (100.5 kg)   Constitutional: overweight, in NAD Eyes: EOMI, no exophthalmos ENT: no thyromegaly, no cervical lymphadenopathy Cardiovascular: RRR, No MRG Respiratory: CTA B Musculoskeletal: no deformities Skin: no rashes Neurological: + Very mild tremor with outstretched hands  ASSESSMENT: 1. DM2, non-insulin -dependent, now more controlled, without long-term complications, but with hyperglycemia  2. HL  3. Obesity class II  PLAN:  1. Patient with longstanding, previously uncontrolled type 2 diabetes, on oral antidiabetic regimen with metformin  and sulfonylurea and also weekly GLP-1/GIP receptor agonist, with significantly improved control lately.  At last visit, HbA1c was lower, at 5.9%, but she had another HbA1c 3 months ago and this was even lower, at 5.6%.  At last visit, sugars were mostly at goal with very few mild hyperglycemic exceptions.  We did not change her regimen at that time. - Of note, she was not able to obtain a CGM. - At today's visit, sugars are at goal, without hypo or hyperglycemic values except for a low blood sugar at 194 when she forgot to take glipizide .  At today's visit, due to her decreasing kidney function (latest GFR was 34), an ~50% decrease in kidney function since last year, I suggested to discuss with oncology to see if any changes are needed in her cancer treatment.  In the meantime, I advised her to stop metformin .  I did advise her that with her decreased kidney function she is more prone to hypoglycemia and recommended to be very vigilant for this. - I suggested to:  Patient Instructions  Please continue: - Glipizide  ER 5 mg before b'fast  - Mounjaro  12.5 mg weekly  Stop Metformin .  Please  discuss with oncology about the decreased kidney function.  Please return in 4 months with your sugar log  - we checked her HbA1c: 5.5% (lower) - advised to check sugars at different times of the day - 1x a day, rotating check times - advised for yearly eye exams >> she is UTD - return to clinic in 4 months  2. HL - Reviewed latest lipid panel from 09/21/2023: All fractions at goal -Continues Lipitor 40 mg daily without side effects  3. Obesity class 2  - She was seen at [facility] for binge eating disorder, but not in the last few years - We changed from Ozempic  to Mounjaro  for stronger effect on weight.  She is on a higher dose, 12.5 mg weekly. - She was seen in the weight management clinic - She lost 33 pounds before the last 3 visits combined, of which 8 before last visit - She lost another 11 pounds since last visit  Lela Fendt, MD PhD [facility] Endocrinology

## 2023-12-29 NOTE — Patient Instructions (Addendum)
 Please continue: - Glipizide  ER 5 mg before b'fast  - Mounjaro  12.5 mg weekly  Stop Metformin .  Please discuss with oncology about the decreased kidney function.  Please return in 4 months with your sugar log

## 2023-12-30 ENCOUNTER — Ambulatory Visit: Payer: Self-pay | Admitting: Internal Medicine

## 2023-12-30 LAB — MICROALBUMIN / CREATININE URINE RATIO
Creatinine, Urine: 141 mg/dL (ref 20–275)
Microalb Creat Ratio: 6 mg/g{creat} (ref <30)
Microalb, Ur: 0.8 mg/dL

## 2024-01-07 ENCOUNTER — Ambulatory Visit: Admitting: Obstetrics and Gynecology

## 2024-01-07 ENCOUNTER — Encounter: Payer: Self-pay | Admitting: Obstetrics and Gynecology

## 2024-01-07 ENCOUNTER — Ambulatory Visit: Payer: Self-pay | Admitting: Obstetrics and Gynecology

## 2024-01-07 VITALS — BP 116/74 | HR 73

## 2024-01-07 DIAGNOSIS — Z87440 Personal history of urinary (tract) infections: Secondary | ICD-10-CM

## 2024-01-07 DIAGNOSIS — N390 Urinary tract infection, site not specified: Secondary | ICD-10-CM

## 2024-01-07 DIAGNOSIS — R300 Dysuria: Secondary | ICD-10-CM | POA: Diagnosis not present

## 2024-01-07 MED ORDER — NITROFURANTOIN MONOHYD MACRO 100 MG PO CAPS: 100.0000 mg | ORAL_CAPSULE | Freq: Two times a day (BID) | ORAL | 0 refills | Status: DC

## 2024-01-07 NOTE — Progress Notes (Signed)
   Acute Office Visit  Subjective:    Patient ID: Barbara Jackson, female    DOB: 04-Jan-1956, 68 y.o.   MRN: [id number]   HPI 68 y.o. presents today for Office Visit (Possible UTI- urinary urgency, pain after urination, no pelvic or back pain, symptoms had gotten better since last visit but have now come back. ) .  Patient's last menstrual period was 06/16/2005.    Review of Systems     Objective:    OBGyn Exam  BP 116/74 (BP Location: Left Arm, Patient Position: Sitting)   Pulse 73   LMP 06/16/2005   SpO2 99%  Wt Readings from Last 3 Encounters:  12/29/23 200 lb 3.2 oz (90.8 kg)  12/22/23 200 lb (90.7 kg)  07/01/23 211 lb (95.7 kg)       Patient had to leave for another appointment. Waiting for lab urine result.   Assessment & Plan:  UA with nitrite positive result.  Left phone message of UTI Macrobid  twice daily sent in 7 days twice daily. May change antibiotic based on UC results in a week or so. Return with worsening or persistent s/s Referral to urogyn for evaluation as well  Dr. Glennon Almarie MARLA Glennon

## 2024-01-10 LAB — URINALYSIS, COMPLETE W/RFL CULTURE
Bilirubin Urine: NEGATIVE
Glucose, UA: NEGATIVE
Hgb urine dipstick: NEGATIVE
Hyaline Cast: NONE SEEN /LPF
Ketones, ur: NEGATIVE
Nitrites, Initial: POSITIVE — AB
Specific Gravity, Urine: 1.02 (ref 1.001–1.035)
pH: 5.5 (ref 5.0–8.0)

## 2024-01-10 LAB — URINE CULTURE
MICRO NUMBER:: 16740952
SPECIMEN QUALITY:: ADEQUATE

## 2024-01-10 LAB — CULTURE INDICATED

## 2024-01-11 NOTE — Telephone Encounter (Signed)
 Patient stated she finishes the medication on Wednesday. She says urinary wise she is feeling better but she still has some pain in her hand. Patient asking if she should come in for a urine check since this is the 2nd time in she has been treated for a uti. Patient placed on lab schedule for a urine culture on Friday August 1st.

## 2024-01-13 DIAGNOSIS — C50411 Malignant neoplasm of upper-outer quadrant of right female breast: Secondary | ICD-10-CM | POA: Diagnosis not present

## 2024-01-13 DIAGNOSIS — E119 Type 2 diabetes mellitus without complications: Secondary | ICD-10-CM | POA: Diagnosis not present

## 2024-01-13 DIAGNOSIS — Z7984 Long term (current) use of oral hypoglycemic drugs: Secondary | ICD-10-CM | POA: Diagnosis not present

## 2024-01-13 DIAGNOSIS — Z170 Estrogen receptor positive status [ER+]: Secondary | ICD-10-CM | POA: Diagnosis not present

## 2024-01-13 DIAGNOSIS — Z9889 Other specified postprocedural states: Secondary | ICD-10-CM | POA: Diagnosis not present

## 2024-01-14 DIAGNOSIS — C50411 Malignant neoplasm of upper-outer quadrant of right female breast: Secondary | ICD-10-CM | POA: Diagnosis not present

## 2024-01-14 DIAGNOSIS — Z170 Estrogen receptor positive status [ER+]: Secondary | ICD-10-CM | POA: Diagnosis not present

## 2024-01-15 ENCOUNTER — Other Ambulatory Visit

## 2024-01-15 DIAGNOSIS — Z87440 Personal history of urinary (tract) infections: Secondary | ICD-10-CM

## 2024-01-15 DIAGNOSIS — N390 Urinary tract infection, site not specified: Secondary | ICD-10-CM | POA: Diagnosis not present

## 2024-01-16 LAB — URINE CULTURE
MICRO NUMBER:: 16775866
Result:: NO GROWTH
SPECIMEN QUALITY:: ADEQUATE

## 2024-01-18 ENCOUNTER — Encounter: Payer: Self-pay | Admitting: Obstetrics and Gynecology

## 2024-01-18 ENCOUNTER — Ambulatory Visit (HOSPITAL_BASED_OUTPATIENT_CLINIC_OR_DEPARTMENT_OTHER): Payer: Self-pay | Admitting: Obstetrics and Gynecology

## 2024-02-12 ENCOUNTER — Encounter: Payer: Self-pay | Admitting: Obstetrics and Gynecology

## 2024-02-15 DIAGNOSIS — R300 Dysuria: Secondary | ICD-10-CM | POA: Diagnosis not present

## 2024-02-15 DIAGNOSIS — N3001 Acute cystitis with hematuria: Secondary | ICD-10-CM | POA: Diagnosis not present

## 2024-02-23 DIAGNOSIS — C50411 Malignant neoplasm of upper-outer quadrant of right female breast: Secondary | ICD-10-CM | POA: Diagnosis not present

## 2024-02-23 DIAGNOSIS — Z79811 Long term (current) use of aromatase inhibitors: Secondary | ICD-10-CM | POA: Diagnosis not present

## 2024-02-23 DIAGNOSIS — Z170 Estrogen receptor positive status [ER+]: Secondary | ICD-10-CM | POA: Diagnosis not present

## 2024-02-23 DIAGNOSIS — Z1722 Progesterone receptor negative status: Secondary | ICD-10-CM | POA: Diagnosis not present

## 2024-02-23 DIAGNOSIS — Z1732 Human epidermal growth factor receptor 2 negative status: Secondary | ICD-10-CM | POA: Diagnosis not present

## 2024-02-23 DIAGNOSIS — Z5181 Encounter for therapeutic drug level monitoring: Secondary | ICD-10-CM | POA: Diagnosis not present

## 2024-02-23 DIAGNOSIS — Z5111 Encounter for antineoplastic chemotherapy: Secondary | ICD-10-CM | POA: Diagnosis not present

## 2024-02-25 NOTE — Progress Notes (Unsigned)
 68 y.o. [id number] Single Caucasian female here for a breast and pelvic exam. Recently had UTI(02/15/24) and finished medication for it but still having some sensations with urination. Has had 3 UTI in past 2 months.    02/23/24 - UC neg 02/14/14 - UC Klebsiella 01/15/24 - UC neg 01/07/24 - UC Klebsiella 12/22/23 - UC Klebsiella  Has an appointment with urogynecology in November.   The patient is also followed for right breast cancer and she receives her care at [facility].   She is doing Zometa  infusions quarterly during her breast cancer care.   PCP: Kip Righter, MD   Patient's last menstrual period was 06/16/2005.           Sexually active: No.  The current method of family planning is post menopausal status.    Menopausal hormone therapy:  n/a Exercising: Yes.    Walking, swimming, and biking 4x week  Smoker:  no  OB History     Gravida  1   Para  1   Term  1   Preterm      AB      Living  1      SAB      IAB      Ectopic      Multiple      Live Births  1           HEALTH MAINTENANCE: Last 2 paps: 10/02/21 neg HR HPV neg, 12/04/17 neg HPV neg History of abnormal Pap or positive HPV:  no Mammogram:  02/11/23 BIRADS Cat 5 Highly suggestive of malignancy. MRI 9/25/24BI-RADS Category 6 - Known biopsy-proven malignancy. Appropriate action should be taken. (Care everywhere)   Colonoscopy:  11/12/16 knows she is due for this.  Bone Density:  11/19/2021 - normal.  She is on quarterly Zometa  prophylaxis.    Immunization History  Administered Date(s) Administered   Influenza Split 04/08/2007, 03/26/2009, 03/11/2010, 04/16/2011, 03/30/2012, 04/22/2013, 02/17/2020, 02/27/2021   Influenza,inj,Quad PF,6+ Mos 03/12/2016, 04/15/2017, 02/25/2018, 03/22/2019   Influenza,inj,quad, With Preservative 05/05/2014, 03/08/2015, 03/16/2017, 02/25/2018   Influenza-Unspecified 03/16/2021, 02/15/2023   Moderna Covid-19 Vaccine Bivalent Booster 48yrs & up 02/27/2021   PFIZER(Purple  Top)SARS-COV-2 Vaccination 08/20/2019, 09/10/2019, 03/31/2020, 09/25/2020   PNEUMOCOCCAL CONJUGATE-20 09/02/2021   Pfizer Covid-19 Vaccine Bivalent Booster 8yrs & up 03/10/2022   Pneumococcal Polysaccharide-23 06/20/2003   Pneumococcal-Unspecified 06/20/2003   Tdap 04/08/2006, 04/18/2016   Tetanus 06/17/1995   Zoster, Live 04/18/2016      reports that she has never smoked. She has never used smokeless tobacco. She reports current alcohol  use of about 2.0 standard drinks of alcohol  per week. She reports that she does not use drugs.  Past Medical History:  Diagnosis Date   Abnormal pap 1998   Negative Hpv   Anxiety    Arthritis    Breast cancer (HCC) 02/09/2023   right   Ole lesion, chronic    Cancer (HCC)    right breast IDC   Diabetes mellitus    Dysrhythmia    hx of atrial tachycardia and pacs   Esophageal reflux    GERD (gastroesophageal reflux disease)    H/O hiatal hernia    H/O iron deficiency anemia 08/2016   Hypercholesteremia    Hypertension    IDA (iron deficiency anemia)    Obesity    compulsive overeater   Osteoarthritis    Sleep apnea    uses oral appliance   Splenic artery aneurysm [facility])     Past Surgical History:  Procedure Laterality  Date   APPENDECTOMY  1975   BRONCHIAL BRUSHINGS  03/31/2023   Procedure: BRONCHIAL BRUSHINGS;  Surgeon: Shelah Lamar RAMAN, MD;  Location: [facility] ENDOSCOPY;  Service: Pulmonary;;   BRONCHIAL NEEDLE ASPIRATION BIOPSY  03/31/2023   Procedure: BRONCHIAL NEEDLE ASPIRATION BIOPSIES;  Surgeon: Shelah Lamar RAMAN, MD;  Location: [facility] ENDOSCOPY;  Service: Pulmonary;;   ENDOBRONCHIAL ULTRASOUND Bilateral 03/31/2023   Procedure: ENDOBRONCHIAL ULTRASOUND;  Surgeon: Shelah Lamar RAMAN, MD;  Location: [facility] ENDOSCOPY;  Service: Pulmonary;  Laterality: Bilateral;   FINE NEEDLE ASPIRATION BIOPSY  03/31/2023   Procedure: FINE NEEDLE ASPIRATION BIOPSY;  Surgeon: Shelah Lamar RAMAN, MD;  Location: [facility] ENDOSCOPY;  Service: Pulmonary;;   PORTACATH PLACEMENT  Right 03/05/2023   Procedure: INSERTION PORT-A-CATH WITH ULTRASOUND ANDREA;  Surgeon: Vanderbilt Ned, MD;  Location: [facility];  Service: General;  Laterality: Right;   TOTAL KNEE ARTHROPLASTY  01/12/2012   Procedure: TOTAL KNEE ARTHROPLASTY;  Surgeon: Dempsey LULLA Moan, MD;  Location: [facility] ORS;  Service: Orthopedics;  Laterality: Left;   TOTAL KNEE ARTHROPLASTY Right 01/10/2019   Procedure: TOTAL KNEE ARTHROPLASTY;  Surgeon: Moan Dempsey, MD;  Location: [facility]S;  Service: Orthopedics;  Laterality: Right;     Current Outpatient Medications  Medication Sig Dispense Refill   albuterol  (VENTOLIN  HFA) 108 (90 Base) MCG/ACT inhaler Inhale 2 puffs into the lungs every 6 (six) hours as needed for wheezing or shortness of breath.     atorvastatin  (LIPITOR) 20 MG tablet Take 20 mg by mouth daily with supper.   0   Blood Glucose Monitoring Suppl (ONE TOUCH ULTRA 2) w/Device KIT Use to check blood sugar 2-3 times a day 1 each 0   D3-50 1.25 MG (50000 UT) capsule Take by mouth.     diazepam  (VALIUM ) 5 MG tablet Take 5-10 mg by mouth every 8 (eight) hours as needed for anxiety.     diphenhydrAMINE  (BENADRYL ) 25 mg capsule Take 25 mg by mouth every 6 (six) hours as needed for allergies.      fluticasone (FLONASE) 50 MCG/ACT nasal spray Place 2 sprays into both nostrils daily as needed for allergies.     FREESTYLE TEST STRIPS test strip   5   glipiZIDE  (GLUCOTROL  XL) 5 MG 24 hr tablet Take 1 tablet (5 mg total) by mouth daily with breakfast. 90 tablet 3   Iron-FA-B Cmp-C-Biot-Probiotic (FUSION PLUS) CAPS Take 1 capsule by mouth daily.     ketoconazole  (NIZORAL ) 2 % cream Apply 1 Application topically daily. 60 g 2   letrozole (FEMARA) 2.5 MG tablet Take 2.5 mg by mouth daily.     losartan -hydrochlorothiazide  (HYZAAR) 100-25 MG per tablet Take 1 tablet by mouth every morning.     omeprazole (PRILOSEC) 40 MG capsule 1 cap(s) orally 20 minutes before breakfast; Duration: 90 days      potassium chloride  (KLOR-CON ) 10 MEQ tablet Take 10 mEq by mouth daily.     Ribociclib Succinate (KISQALI 200 DOSE PO)      sertraline  (ZOLOFT ) 50 MG tablet Take 50 mg by mouth daily with supper.     tirzepatide  (MOUNJARO ) 12.5 MG/0.5ML Pen INJECT 12.5 MG SUBCUTANEOUSLY ONE TIME PER WEEK 6 mL 2   Vitamin D , Ergocalciferol , (DRISDOL ) 1.25 MG (50000 UNIT) CAPS capsule Take 1 capsule (50,000 Units total) by mouth every 7 (seven) days. 12 capsule 0   zolpidem  (AMBIEN ) 10 MG tablet Take 10 mg by mouth at bedtime as needed for sleep.      nitrofurantoin , macrocrystal-monohydrate, (MACROBID ) 100 MG capsule  Take 1 capsule (100 mg total) by mouth 2 (two) times daily. (Patient not taking: Reported on 02/29/2024) 14 capsule 0   No current facility-administered medications for this visit.    ALLERGIES: Diclofenac, Glucotrol  [glipizide ], Lisinopril, and Adhesive [tape]  Family History  Problem Relation Age of Onset   Breast cancer Mother 25   Heart attack Father        Deceased, 60   COPD Father    Prostate cancer Father 78 - 93   Obesity Sister    Hypertension Brother    Healthy Son    Breast cancer Cousin 35       maternal 1st cousin   Colon cancer Neg Hx    Stomach cancer Neg Hx    Esophageal cancer Neg Hx     Review of Systems  All other systems reviewed and are negative.   PHYSICAL EXAM:  BP 118/74 (BP Location: Left Arm, Patient Position: Sitting)   Pulse 65   Ht 5' 5.5 (1.664 m)   Wt 206 lb (93.4 kg)   LMP 06/16/2005   SpO2 98%   BMI 33.76 kg/m     General appearance: alert, cooperative and appears stated age Head: normocephalic, without obvious abnormality, atraumatic Neck: no adenopathy, supple, symmetrical, trachea midline and thyroid normal to inspection and palpation.  Port a cath in place. Lungs: clear to auscultation bilaterally Breasts: right - normal appearance, subtle 2.5 cm mass in upper outer quadrant, no tenderness, No nipple retraction or dimpling, No nipple  discharge or bleeding, No axillary adenopathy Left - normal appearance, no mass, no tenderness, No nipple retraction or dimpling, No nipple discharge or bleeding, No axillary adenopathy Heart: regular rate and rhythm Abdomen: soft, non-tender; no masses, no organomegaly Extremities: extremities normal, atraumatic, no cyanosis or edema Skin: skin color, texture, turgor normal. No rashes or lesions Lymph nodes: cervical, supraclavicular, and axillary nodes normal. Neurologic: grossly normal  Pelvic: External genitalia:  no lesions              No abnormal inguinal nodes palpated.              Urethra:  normal appearing urethra with no masses, tenderness or lesions              Bartholins and Skenes: normal                 Vagina: normal appearing vagina with normal color and discharge, no lesions              Cervix: no lesions              Pap taken: no Bimanual Exam:  Uterus:  normal size, contour, position, consistency, mobility, non-tender              Adnexa: no mass, fullness, tenderness              Rectal exam: yes.  Confirms.              Anus:  normal sphincter tone, no lesions  Chaperone was present for exam:  Kari HERO, CMA  ASSESSMENT: Encounter for breast and pelvic exam.  Hx right breast cancer, stage IV.  On Letrozole and Kisqali.   PET scan every quarter.  Care at [facility].   Negative genetic testing.  Hx UTIs.  Klebsiella.   Hx remote abnormal pap smear.  Overactive bladder.   Not currently treating.   On Zometa  prophylactically, treating through [facility] oncology.   PLAN: Mammogram screening discussed.  Self breast awareness reviewed. Pap and HRV collected:  no. Due in 2028. Guidelines for Calcium , Vitamin D , regular exercise program including cardiovascular and weight bearing exercise. Medication refills:  NA Dexa at [facility].  She will reach out to GI regarding recommendation for colon cancer screening.  Urinalyis:  sg >= 1.030, ph 5.5, protein 2+, 6 -  10 WBC, 0 - 2 RBC, 6 - 10 epis, moderate bacteria, few mucus.  Reflex culture. Will wait for urine culture before prescribing abx.   If positive UC, will do recheck of urine after tx.  Keep appt with urogynecology Nov 3.  Dexa ordered for [facility] by Dr. Glennon.  Follow up:  yearly and prn.     Additional counseling given.  yes. 35 min  total time was spent for this patient encounter, including preparation, face-to-face counseling with the patient, coordination of care, and documentation of the encounter in addition to doing the breast and pelvic exam.

## 2024-02-29 ENCOUNTER — Ambulatory Visit: Payer: Self-pay | Admitting: Obstetrics and Gynecology

## 2024-02-29 ENCOUNTER — Ambulatory Visit (INDEPENDENT_AMBULATORY_CARE_PROVIDER_SITE_OTHER): Admitting: Obstetrics and Gynecology

## 2024-02-29 ENCOUNTER — Encounter: Payer: Self-pay | Admitting: Obstetrics and Gynecology

## 2024-02-29 VITALS — BP 118/74 | HR 65 | Ht 65.5 in | Wt 206.0 lb

## 2024-02-29 DIAGNOSIS — Z853 Personal history of malignant neoplasm of breast: Secondary | ICD-10-CM

## 2024-02-29 DIAGNOSIS — C50411 Malignant neoplasm of upper-outer quadrant of right female breast: Secondary | ICD-10-CM

## 2024-02-29 DIAGNOSIS — Z9289 Personal history of other medical treatment: Secondary | ICD-10-CM | POA: Diagnosis not present

## 2024-02-29 DIAGNOSIS — Z01419 Encounter for gynecological examination (general) (routine) without abnormal findings: Secondary | ICD-10-CM | POA: Diagnosis not present

## 2024-02-29 DIAGNOSIS — N390 Urinary tract infection, site not specified: Secondary | ICD-10-CM | POA: Diagnosis not present

## 2024-02-29 NOTE — Patient Instructions (Signed)
 You bone density is scheduled [facility].  Please call to schedule the appointment.   EXERCISE AND DIET:  We recommended that you start or continue a regular exercise program for good health. Regular exercise means any activity that makes your heart beat faster and makes you sweat.  We recommend exercising at least 30 minutes per day at least 3 days a week, preferably 4 or 5.  We also recommend a diet low in fat and sugar.  Inactivity, poor dietary choices and obesity can cause diabetes, heart attack, stroke, and kidney damage, among others.    ALCOHOL  AND SMOKING:  Women should limit their alcohol  intake to no more than 7 drinks/beers/glasses of wine (combined, not each!) per week. Moderation of alcohol  intake to this level decreases your risk of breast cancer and liver damage. And of course, no recreational drugs are part of a healthy lifestyle.  And absolutely no smoking or even second hand smoke. Most people know smoking can cause heart and lung diseases, but did you know it also contributes to weakening of your bones? Aging of your skin?  Yellowing of your teeth and nails?  CALCIUM  AND VITAMIN D :  Adequate intake of calcium  and Vitamin D  are recommended.  The recommendations for exact amounts of these supplements seem to change often, but generally speaking 600 mg of calcium  (either carbonate or citrate) and 800 units of Vitamin D  per day seems prudent. Certain women may benefit from higher intake of Vitamin D .  If you are among these women, your doctor will have told you during your visit.    PAP SMEARS:  Pap smears, to check for cervical cancer or precancers,  have traditionally been done yearly, although recent scientific advances have shown that most women can have pap smears less often.  However, every woman still should have a physical exam from her gynecologist every year. It will include a breast check, inspection of the vulva and vagina to check for abnormal growths or skin  changes, a visual exam of the cervix, and then an exam to evaluate the size and shape of the uterus and ovaries.  And after 68 years of age, a rectal exam is indicated to check for rectal cancers. We will also provide age appropriate advice regarding health maintenance, like when you should have certain vaccines, screening for sexually transmitted diseases, bone density testing, colonoscopy, mammograms, etc.   MAMMOGRAMS:  All women over 68 years old should have a yearly mammogram. Many facilities now offer a 3D mammogram, which may cost around $50 extra out of pocket. If possible,  we recommend you accept the option to have the 3D mammogram performed.  It both reduces the number of women who will be called back for extra views which then turn out to be normal, and it is better than the routine mammogram at detecting truly abnormal areas.    COLONOSCOPY:  Colonoscopy to screen for colon cancer is recommended for all women at age 68.  We know, you hate the idea of the prep.  We agree, BUT, having colon cancer and not knowing it is worse!!  Colon cancer so often starts as a polyp that can be seen and removed at colonscopy, which can quite literally save your life!  And if your first colonoscopy is normal and you have no family history of colon cancer, most women don't have to have it again for 10 years.  Once every ten years, you can do something that may end up  saving your life, right?  We will be happy to help you get it scheduled when you are ready.  Be sure to check your insurance coverage so you understand how much it will cost.  It may be covered as a preventative service at no cost, but you should check your particular policy.

## 2024-03-01 ENCOUNTER — Other Ambulatory Visit: Payer: Self-pay

## 2024-03-02 LAB — URINALYSIS, COMPLETE W/RFL CULTURE
Glucose, UA: NEGATIVE
Hgb urine dipstick: NEGATIVE
Hyaline Cast: NONE SEEN /LPF
Ketones, ur: NEGATIVE
Leukocyte Esterase: NEGATIVE
Nitrites, Initial: NEGATIVE
Specific Gravity, Urine: 1.029 (ref 1.001–1.035)
pH: 5.5 (ref 5.0–8.0)

## 2024-03-02 LAB — CULTURE INDICATED

## 2024-03-02 LAB — URINE CULTURE
MICRO NUMBER:: 16967679
Result:: NO GROWTH
SPECIMEN QUALITY:: ADEQUATE

## 2024-04-05 DIAGNOSIS — Z23 Encounter for immunization: Secondary | ICD-10-CM | POA: Diagnosis not present

## 2024-04-05 DIAGNOSIS — R5383 Other fatigue: Secondary | ICD-10-CM | POA: Diagnosis not present

## 2024-04-05 DIAGNOSIS — I10 Essential (primary) hypertension: Secondary | ICD-10-CM | POA: Diagnosis not present

## 2024-04-05 DIAGNOSIS — F411 Generalized anxiety disorder: Secondary | ICD-10-CM | POA: Diagnosis not present

## 2024-04-05 DIAGNOSIS — C50919 Malignant neoplasm of unspecified site of unspecified female breast: Secondary | ICD-10-CM | POA: Diagnosis not present

## 2024-04-05 DIAGNOSIS — E1165 Type 2 diabetes mellitus with hyperglycemia: Secondary | ICD-10-CM | POA: Diagnosis not present

## 2024-04-11 DIAGNOSIS — C50411 Malignant neoplasm of upper-outer quadrant of right female breast: Secondary | ICD-10-CM | POA: Diagnosis not present

## 2024-04-11 DIAGNOSIS — Z170 Estrogen receptor positive status [ER+]: Secondary | ICD-10-CM | POA: Diagnosis not present

## 2024-04-11 DIAGNOSIS — Z79622 Long term (current) use of janus kinase inhibitor: Secondary | ICD-10-CM | POA: Diagnosis not present

## 2024-04-11 DIAGNOSIS — R928 Other abnormal and inconclusive findings on diagnostic imaging of breast: Secondary | ICD-10-CM | POA: Diagnosis not present

## 2024-04-11 DIAGNOSIS — Z9889 Other specified postprocedural states: Secondary | ICD-10-CM | POA: Diagnosis not present

## 2024-04-14 DIAGNOSIS — C50411 Malignant neoplasm of upper-outer quadrant of right female breast: Secondary | ICD-10-CM | POA: Diagnosis not present

## 2024-04-14 DIAGNOSIS — Z170 Estrogen receptor positive status [ER+]: Secondary | ICD-10-CM | POA: Diagnosis not present

## 2024-04-18 ENCOUNTER — Encounter: Payer: Self-pay | Admitting: Obstetrics and Gynecology

## 2024-04-18 ENCOUNTER — Ambulatory Visit: Admitting: Obstetrics and Gynecology

## 2024-04-18 VITALS — BP 109/72 | HR 66 | Ht 64.5 in | Wt 205.4 lb

## 2024-04-18 DIAGNOSIS — N362 Urethral caruncle: Secondary | ICD-10-CM | POA: Diagnosis not present

## 2024-04-18 DIAGNOSIS — N390 Urinary tract infection, site not specified: Secondary | ICD-10-CM

## 2024-04-18 DIAGNOSIS — N952 Postmenopausal atrophic vaginitis: Secondary | ICD-10-CM | POA: Diagnosis not present

## 2024-04-18 DIAGNOSIS — Z87440 Personal history of urinary (tract) infections: Secondary | ICD-10-CM

## 2024-04-18 DIAGNOSIS — R350 Frequency of micturition: Secondary | ICD-10-CM

## 2024-04-18 LAB — POCT URINALYSIS DIP (CLINITEK)
Bilirubin, UA: NEGATIVE
Blood, UA: NEGATIVE
Glucose, UA: NEGATIVE mg/dL
Ketones, POC UA: NEGATIVE mg/dL
Leukocytes, UA: NEGATIVE
Nitrite, UA: NEGATIVE
Spec Grav, UA: >=1.03 — AB (ref 1.010–1.025)
Urobilinogen, UA: 0.2 U/dL
pH, UA: 5.5 (ref 5.0–8.0)

## 2024-04-18 MED ORDER — ESTRADIOL 7.5 MCG/24HR VA RING: 1.0000 | VAGINAL_RING | VAGINAL | 3 refills | Status: DC

## 2024-04-18 MED ORDER — VIBEGRON 75 MG PO TABS: 75.0000 mg | ORAL_TABLET | Freq: Every evening | ORAL | 5 refills | Status: AC

## 2024-04-18 NOTE — Patient Instructions (Signed)
 Take the D-mannose

## 2024-04-18 NOTE — Progress Notes (Unsigned)
  Urogynecology New Patient Evaluation and Consultation  Referring Provider: Glennon Almarie POUR, MD PCP: Kip Righter, MD Date of Service: 04/18/2024  SUBJECTIVE Chief Complaint: New Patient (Initial Visit) Barbara Jackson is a 68 y.o. female is here for dysuria and recurrent UTI's.)  History of Present Illness: Barbara Jackson is a 68 y.o. White or Caucasian female seen in consultation at the request of Dr. Glennon for evaluation of recurrent UTI.    Review of records significant for:  Has previously tried Oxybutynin  for OAB without success Has done pelvic floor PT for leakage  Culture hx: 02/29/24: Negative 01/15/24: Negative 01/07/24: Klebsiella 50-100k growth, pan sensitive 12/22/23: Klebsiella 10-49k growth, pan sensitive     Urinary Symptoms: Leaks urine with cough/ sneeze and exercise Leaks unknown times per day Pad use: 2 liners/ mini-pads per day.   Patient is bothered by UI symptoms.  Day time voids 6.  Nocturia: 1+ times per night to void. Voiding dysfunction:  empties bladder well.  Patient does not use a catheter to empty bladder.  When urinating, patient feels a weak stream Drinks: Coffee 2 shots espresso, Water  >36oz per day, iced 1/2 and 1/2 tea, and occasional soda per day  UTIs: 2 UTI's in the last year.   Denies history of urologic concerns.  No results found for the last 90 days.   Pelvic Organ Prolapse Symptoms:                  Patient Denies a feeling of a bulge the vaginal area.   Bowel Symptom: Bowel movements: 1 time(s) per day Stool consistency: soft  Straining: no.  Splinting: no.  Incomplete evacuation: no.  Patient Denies accidental bowel leakage / fecal incontinence Bowel regimen: none Last colonoscopy: Date 2018, Results WNL [facility] Colonoscopy          Upcoming     Colonoscopy (Every 10 Years) Next due on 11/13/2026    11/12/2016  [facility] COLONOSCOPY   Only the first 1 history entries have been loaded, but more history  exists.                Sexual Function Sexually active: no.  Sexual orientation: Straight Pain with sex: No  Pelvic Pain Denies pelvic pain    Past Medical History:  Past Medical History:  Diagnosis Date   Abnormal pap 1998   Negative Hpv   Anxiety    Arthritis    Breast cancer (HCC) 02/09/2023   right   Cameron lesion, chronic    Cancer (HCC)    right breast IDC   Diabetes mellitus    Dysrhythmia    hx of atrial tachycardia and pacs   Esophageal reflux    GERD (gastroesophageal reflux disease)    H/O hiatal hernia    H/O iron deficiency anemia 08/2016   Hypercholesteremia    Hypertension    IDA (iron deficiency anemia)    Obesity    compulsive overeater   Osteoarthritis    Sleep apnea    uses oral appliance   Splenic artery aneurysm      Past Surgical History:   Past Surgical History:  Procedure Laterality Date   APPENDECTOMY  1975   BRONCHIAL BRUSHINGS  03/31/2023   Procedure: BRONCHIAL BRUSHINGS;  Surgeon: Shelah Lamar RAMAN, MD;  Location: [facility] ENDOSCOPY;  Service: Pulmonary;;   BRONCHIAL NEEDLE ASPIRATION BIOPSY  03/31/2023   Procedure: BRONCHIAL NEEDLE ASPIRATION BIOPSIES;  Surgeon: Shelah Lamar RAMAN, MD;  Location: [facility] ENDOSCOPY;  Service: Pulmonary;;  ENDOBRONCHIAL ULTRASOUND Bilateral 03/31/2023   Procedure: ENDOBRONCHIAL ULTRASOUND;  Surgeon: Shelah Lamar RAMAN, MD;  Location: [facility] ENDOSCOPY;  Service: Pulmonary;  Laterality: Bilateral;   FINE NEEDLE ASPIRATION BIOPSY  03/31/2023   Procedure: FINE NEEDLE ASPIRATION BIOPSY;  Surgeon: Shelah Lamar RAMAN, MD;  Location: [facility] ENDOSCOPY;  Service: Pulmonary;;   PORTACATH PLACEMENT Right 03/05/2023   Procedure: INSERTION PORT-A-CATH WITH ULTRASOUND ANDREA;  Surgeon: Vanderbilt Ned, MD;  Location: [facility];  Service: General;  Laterality: Right;   TOTAL KNEE ARTHROPLASTY  01/12/2012   Procedure: TOTAL KNEE ARTHROPLASTY;  Surgeon: Dempsey LULLA Moan, MD;  Location: [facility] ORS;  Service: Orthopedics;   Laterality: Left;   TOTAL KNEE ARTHROPLASTY Right 01/10/2019   Procedure: TOTAL KNEE ARTHROPLASTY;  Surgeon: Moan Dempsey, MD;  Location: [facility] ORS;  Service: Orthopedics;  Laterality: Right;      Past OB/GYN History: G1P1001 Vaginal deliveries: 1,  Forceps/ Vacuum deliveries: 0, Cesarean section: 0 Menopausal: Yes, at age 19 Contraception: None. Last pap smear was 2023.  Any history of abnormal pap smears: no. [facility] PAP   This patient has no relevant Health Maintenance data.     Medications: Patient has a current medication list which includes the following prescription(s): albuterol , atorvastatin , one touch ultra 2, cetirizine, d3-50, diazepam , diphenhydramine , estradiol , fluticasone, freestyle test strips, glipizide , fusion plus, ketoconazole , losartan -hydrochlorothiazide , omeprazole, potassium chloride , ribociclib succinate, sertraline , mounjaro , vibegron, vitamin d  (ergocalciferol ), and zolpidem .   Allergies: Patient is allergic to diclofenac, glucotrol  [glipizide ], lisinopril, and adhesive [tape].   Social History:  Social History   Tobacco Use   Smoking status: Never   Smokeless tobacco: Never  Vaping Use   Vaping status: Never Used  Substance Use Topics   Alcohol  use: Yes    Alcohol /week: 2.0 standard drinks of alcohol     Types: 2 Standard drinks or equivalent per week    Comment: occassionally   Drug use: No    Relationship status: single Patient lives alone.   Patient is not employed. Regular exercise: Yes: Walking, swimming, dancing, strength training.  History of abuse: No  Family History:   Family History  Problem Relation Age of Onset   Breast cancer Mother 65   Heart attack Father        Deceased, 74   COPD Father    Prostate cancer Father 39 - 60   Obesity Sister    Hypertension Brother    Healthy Son    Breast cancer Cousin 70       maternal 1st cousin   Colon cancer Neg Hx    Stomach cancer Neg Hx    Esophageal cancer Neg Hx      Review  of Systems: Review of Systems  Constitutional:  Positive for malaise/fatigue. Negative for chills and fever.  Respiratory:  Negative for cough and shortness of breath.   Cardiovascular:  Negative for chest pain and palpitations.  Gastrointestinal:  Negative for abdominal pain, blood in stool, constipation and diarrhea.  Genitourinary:  Positive for dysuria.  Skin:  Negative for rash.  Neurological:  Positive for dizziness. Negative for weakness.  Psychiatric/Behavioral:  Negative for depression and suicidal ideas. The patient is nervous/anxious.      OBJECTIVE Physical Exam: Vitals:   04/18/24 1003  BP: 109/72  Pulse: 66  Weight: 205 lb 6.4 oz (93.2 kg)  Height: 5' 4.5 (1.638 m)    Physical Exam Vitals reviewed. Exam conducted with a chaperone present.  Constitutional:      Appearance: Normal appearance.  Pulmonary:  Effort: Pulmonary effort is normal.  Abdominal:     Palpations: Abdomen is soft.  Neurological:     General: No focal deficit present.     Mental Status: She is alert and oriented to person, place, and time.  Psychiatric:        Mood and Affect: Mood normal.        Behavior: Behavior normal. Behavior is cooperative.        Thought Content: Thought content normal.      GU / Detailed Urogynecologic Evaluation:  Pelvic Exam: Normal external female genitalia; Bartholin's and Skene's glands normal in appearance; urethral meatus normal in appearance, no urethral masses or discharge.   CST: negative  Speculum exam reveals normal vaginal mucosa with atrophy. Cervix normal appearance. Uterus normal single, nontender. Adnexa normal adnexa.    Pelvic floor strength I/V  Pelvic floor musculature: Right levator non-tender, Right obturator non-tender, Left levator non-tender, Left obturator non-tender  POP-Q:   POP-Q  -3                                            Aa   -3                                           Ba  -5.5                                               C   1.5                                            Gh  3.5                                            Pb  7.5                                            tvl   -3                                            Ap  -3                                            Bp  -7                                              D      Rectal Exam:  Normal External exam  Post-Void  Residual (PVR) by Bladder Scan: In order to evaluate bladder emptying, we discussed obtaining a postvoid residual and patient agreed to this procedure.  Procedure: The ultrasound unit was placed on the patient's abdomen in the suprapubic region after the patient had voided.    Post Void Residual - 04/18/24 1016       Post Void Residual   Post Void Residual 1 mL           Laboratory Results: Lab Results  Component Value Date   COLORU yellow 04/18/2024   CLARITYU clear 04/18/2024   GLUCOSEUR negative 04/18/2024   BILIRUBINUR negative 04/18/2024   KETONESU n 05/21/2020   SPECGRAV >=1.030 (A) 04/18/2024   RBCUR negative 04/18/2024   PHUR 5.5 04/18/2024   PROTEINUR 2+ (A) 02/29/2024   UROBILINOGEN 0.2 04/18/2024   LEUKOCYTESUR Negative 04/18/2024    Lab Results  Component Value Date   CREATININE 1.16 (H) 04/18/2023   CREATININE 0.90 02/18/2023   CREATININE 0.77 02/06/2023    Lab Results  Component Value Date   HGBA1C 5.5 12/29/2023    Lab Results  Component Value Date   HGB 14.8 04/18/2023     ASSESSMENT AND PLAN Ms. Crow is a 68 y.o. with:  1. Urinary frequency   2. Recurrent UTI   3. Urethral caruncle   4. Vaginal atrophy    We discussed the symptoms of overactive bladder (OAB), which include urinary urgency, urinary frequency, nocturia, with or without urge incontinence.  While we do not know the exact etiology of OAB, several treatment options exist. We discussed management including behavioral therapy (decreasing bladder irritants, urge suppression strategies, timed  voids, bladder retraining), physical therapy, medication; For anticholinergic medications, we discussed the potential side effects of anticholinergics including dry eyes, dry mouth, constipation, cognitive impairment and urinary retention. For Beta-3 agonist medication, we discussed the potential side effect of elevated blood pressure which is more likely to occur in individuals with uncontrolled hypertension. Patient reports leakage at night and during the day. She has previously tried and failed Oxybutynin . Would not suggest other anticholinergics due to age and risk of cognitive impairment. Would suggest Gemtesa 75mg  daily. She is not a good candidate for Myrbetriq due to her use of Losartan  and do not want to mess with her blood pressure.  Patient has had x2 UTI's this year. Will plan to start with supplementation (D-mannose) as she is already using cranberry extract. Will also do an E-string.  On Exam, patient has a urethral caruncle and vaginal atrophy. Using a mirror, I showed her the caruncle.  Will order an E-string for patient. This will be placed and last 90 days and give timed release estrogen to decrease risk of UTI, support the vaginal and urethral tissues, and decrease vaginal dryness. Patient to get E-String and I will place at next visit.   Patient to follow up for E-string placement.   Verania Salberg G Jontavius Rabalais, NP

## 2024-04-19 ENCOUNTER — Ambulatory Visit (HOSPITAL_BASED_OUTPATIENT_CLINIC_OR_DEPARTMENT_OTHER)
Admission: RE | Admit: 2024-04-19 | Discharge: 2024-04-19 | Disposition: A | Discharge status: Home / Self Care | Source: Ambulatory Visit | Attending: Obstetrics and Gynecology | Admitting: Obstetrics and Gynecology

## 2024-04-19 ENCOUNTER — Encounter: Payer: Self-pay | Admitting: Obstetrics and Gynecology

## 2024-04-19 DIAGNOSIS — E2839 Other primary ovarian failure: Secondary | ICD-10-CM | POA: Insufficient documentation

## 2024-04-19 DIAGNOSIS — R300 Dysuria: Secondary | ICD-10-CM | POA: Insufficient documentation

## 2024-04-19 DIAGNOSIS — M8588 Other specified disorders of bone density and structure, other site: Secondary | ICD-10-CM | POA: Diagnosis not present

## 2024-04-19 DIAGNOSIS — R3915 Urgency of urination: Secondary | ICD-10-CM | POA: Insufficient documentation

## 2024-04-19 DIAGNOSIS — Z780 Asymptomatic menopausal state: Secondary | ICD-10-CM | POA: Diagnosis not present

## 2024-04-19 MED ORDER — ESTRADIOL 0.01 % VA CREA: 0.5000 g | TOPICAL_CREAM | VAGINAL | 11 refills | Status: AC

## 2024-04-20 ENCOUNTER — Ambulatory Visit

## 2024-04-24 ENCOUNTER — Other Ambulatory Visit: Payer: Self-pay | Admitting: Internal Medicine

## 2024-04-24 DIAGNOSIS — E1165 Type 2 diabetes mellitus with hyperglycemia: Secondary | ICD-10-CM

## 2024-04-26 ENCOUNTER — Other Ambulatory Visit: Payer: Self-pay | Admitting: Internal Medicine

## 2024-04-26 DIAGNOSIS — E1165 Type 2 diabetes mellitus with hyperglycemia: Secondary | ICD-10-CM

## 2024-05-02 ENCOUNTER — Encounter: Payer: Self-pay | Admitting: Internal Medicine

## 2024-05-02 ENCOUNTER — Ambulatory Visit: Admitting: Internal Medicine

## 2024-05-02 VITALS — BP 120/60 | HR 84 | Ht 64.5 in | Wt 205.4 lb

## 2024-05-02 DIAGNOSIS — Z7985 Long-term (current) use of injectable non-insulin antidiabetic drugs: Secondary | ICD-10-CM | POA: Diagnosis not present

## 2024-05-02 DIAGNOSIS — E1165 Type 2 diabetes mellitus with hyperglycemia: Secondary | ICD-10-CM

## 2024-05-02 DIAGNOSIS — Z7984 Long term (current) use of oral hypoglycemic drugs: Secondary | ICD-10-CM

## 2024-05-02 DIAGNOSIS — E785 Hyperlipidemia, unspecified: Secondary | ICD-10-CM

## 2024-05-02 LAB — POCT GLYCOSYLATED HEMOGLOBIN (HGB A1C): Hemoglobin A1C: 5.7 % — AB (ref 4.0–5.6)

## 2024-05-02 MED ORDER — MOUNJARO 12.5 MG/0.5ML ~~LOC~~ SOAJ: 12.5000 mg | SUBCUTANEOUS | 3 refills | Status: AC

## 2024-05-02 NOTE — Progress Notes (Signed)
 Patient ID: Barbara Jackson, female   DOB: [birth year], 68 y.o.   MRN: [id number]   HPI: Barbara Jackson is a 68 y.o.-year-old female, returning for follow-up for DM2, dx in 2007, non-insulin -dependent, now  controlled, without long-term complications.  Last visit 4 months ago.  In 2024, she was diagnosed with breast cancer stage 3 >> now stage 4. 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  Because of this, she  could not participate in a trial at [facility]. On Letrozole and Ribociclib. No chemotherapy, no Dexamethasone .  Interim history: No blurry vision, nausea, chest pain.  She otherwise feels well. She had several UTIs since last visit.  These have resolved.  Reviewed HbA1c levels: Lab Results  Component Value Date   HGBA1C 5.5 12/29/2023   HGBA1C 5.9 (A) 07/01/2023   HGBA1C 6.7 (A) 02/23/2023   HGBA1C 6.4 (A) 09/01/2022   HGBA1C 6.3 (A) 04/28/2022   HGBA1C 6.6 (H) 12/04/2021   HGBA1C 6.4 (A) 02/11/2021   HGBA1C 6.1 (A) 09/19/2020   HGBA1C 6.3 (A) 05/16/2020   HGBA1C 6.1 (A) 08/23/2019  09/21/2023: HbA1c 5.6% 03/11/2023: HbA1c 6.9% 08/13/2021: HbA1c 6.9% 09/19/2020: HbA1c 6.1% 07/25/2020: HbA1c 6.4% 11/25/2019: HbA1c 6.6% 02/22/2018: HbA1c 7.0% 07/2017: HbA1c 7.7% 07/10/2016: HbA1c 7.0%  Pt is on a regimen of: -  >> stopped 12/2023 due to decreased kidney function - Glipizide  ER 5 mg before breakfast-added 02/2023 - Mounjaro  7.5 >> 10 >> 12.5 mg weekly We stopped Actos  08/17/2018. She tried Januvia, but stopped last year when she was on a new diet at that time and did not want to make any changes in her medicines. We stopped Ozempic  when changing to Mounjaro . She stopped glipizide  07/2022.  Pt checks her sugars 1x a day - forgot log, cannot remember values: - am:  127-141 >> 107-132, 141 >> 79-112, 137 >> 90-110 - 2h after b'fast: 136, 151 >> 141, 145 >> 104 >> 120-161 >> ?, 187 (bagel) - before lunch: 111-128 >> 131-154 >> 110 >> 105-134 >> ? - 2h after lunch:  114-122, 153 >> 141-172 >> 147 >> 102-192 >> ? - before dinner: 123-142, 189 >> 102, 122 >> 77, 81-128 >> ? - 2h after dinner: 119-135, 164 >> 147 >> 92-147, 177 >> ? - bedtime:  124-179, 287 (dessert) >> n/c >> 98, 134 >> 90s, 110s - nighttime: n/c >> 120, 133 >> n/c >> 131 >> n/c Lowest sugar was  119 >> 102 >> 77 >> 90; she has hypoglycemia awareness in the 70s. Highest sugar was 287 >> 147 >> 192 (forgot Glipizide ) >> 187  Glucometer: One Touch Ultra 2  In the past, she read Dr. Rosalynn Points program for reversing diabetes program started to adopt the changes suggested in the log.  Her sugars started to improve significantly.   She also saw [name] with nutrition in the past.  She was being seen in the binge eating clinic at [facility].  However, not recently.  -+ Worsening CKD, last BUN/creatinine:   04/14/2024: 27/1.53, GFR 37, Glu 133  09/21/2023: GFR 54 Lab Results  Component Value Date   BUN 20 04/18/2023   BUN 23 02/18/2023   CREATININE 1.16 (H) 04/18/2023   CREATININE 0.90 02/18/2023   12/29/2023: ACR 6 No components found for: MICROALBCREAT On losartan  100.  -+ HL;  last set of lipids: 09/21/2023: 155/153/81/50 09/03/2022: 148/177/76/46 Lab Results  Component Value Date   CHOL 174 08/13/2021   HDL 84.60 08/13/2021  LDLDIRECT 33.0 08/13/2021   TRIG 217.0 (H) 08/13/2021   CHOLHDL 2 08/13/2021  On Lipitor 40.  - last eye exam was 2025: No DR; + glaucoma suspect and she also has cataracts (prev. [facility], Dr. Roz >> now Dr. Cleatus).   - no numbness and tingling in her feet.  She sees podiatry.  Last foot exam 10/26/2023 by Dr. Sikora. She had Morton's neuroma.  Pt has FH of DM in MGM.  She also has depression-on Cymbalta, HTN, frequent UTIs and pelvic floor pain.  Previously on treatment of pain chronically. She saw several specialists but no clear diagnosis was made.  Symptoms resolved ~10/2020. She is on iron -it consistently.  ROS: + see  HPI  I reviewed pt's medications, allergies, PMH, social hx, family hx, and changes were documented in the history of present illness. Otherwise, unchanged from my initial visit note.  Past Medical History:  Diagnosis Date   Abnormal pap 1998   Negative Hpv   Anxiety    Arthritis    Breast cancer (HCC) 02/09/2023   right   Cameron lesion, chronic    Cancer (HCC)    right breast IDC   Diabetes mellitus    Dysrhythmia    hx of atrial tachycardia and pacs   Esophageal reflux    GERD (gastroesophageal reflux disease)    H/O hiatal hernia    H/O iron deficiency anemia 08/2016   Hypercholesteremia    Hypertension    IDA (iron deficiency anemia)    Obesity    compulsive overeater   Osteoarthritis    Sleep apnea    uses oral appliance   Splenic artery aneurysm    Past Surgical History:  Procedure Laterality Date   APPENDECTOMY  1975   BRONCHIAL BRUSHINGS  03/31/2023   Procedure: BRONCHIAL BRUSHINGS;  Surgeon: Shelah Lamar RAMAN, MD;  Location: [facility] ENDOSCOPY;  Service: Pulmonary;;   BRONCHIAL NEEDLE ASPIRATION BIOPSY  03/31/2023   Procedure: BRONCHIAL NEEDLE ASPIRATION BIOPSIES;  Surgeon: Shelah Lamar RAMAN, MD;  Location: [facility] ENDOSCOPY;  Service: Pulmonary;;   ENDOBRONCHIAL ULTRASOUND Bilateral 03/31/2023   Procedure: ENDOBRONCHIAL ULTRASOUND;  Surgeon: Shelah Lamar RAMAN, MD;  Location: [facility] ENDOSCOPY;  Service: Pulmonary;  Laterality: Bilateral;   FINE NEEDLE ASPIRATION BIOPSY  03/31/2023   Procedure: FINE NEEDLE ASPIRATION BIOPSY;  Surgeon: Shelah Lamar RAMAN, MD;  Location: [facility] ENDOSCOPY;  Service: Pulmonary;;   PORTACATH PLACEMENT Right 03/05/2023   Procedure: INSERTION PORT-A-CATH WITH ULTRASOUND ANDREA;  Surgeon: Vanderbilt Ned, MD;  Location: [facility];  Service: General;  Laterality: Right;   TOTAL KNEE ARTHROPLASTY  01/12/2012   Procedure: TOTAL KNEE ARTHROPLASTY;  Surgeon: Dempsey LULLA Moan, MD;  Location: [facility];  Service: Orthopedics;  Laterality: Left;   TOTAL KNEE  ARTHROPLASTY Right 01/10/2019   Procedure: TOTAL KNEE ARTHROPLASTY;  Surgeon: Moan Dempsey, MD;  Location: [facility];  Service: Orthopedics;  Laterality: Right;    Social History   Socioeconomic History   Marital status: Divorced    Spouse name: Not on file   Number of children: 1   Years of education: Not on file   Highest education level: Not on file  Occupational History    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   Lives alone.     Highest level of education:  One year graduate school   Current Outpatient Medications on File Prior to Visit  Medication Sig Dispense Refill   albuterol  (VENTOLIN  HFA) 108 (90 Base) MCG/ACT inhaler Inhale 2 puffs into the lungs every 6 (six) hours as needed for wheezing or shortness of breath.     atorvastatin  (LIPITOR) 20 MG tablet Take 20 mg by mouth daily with supper.   0   Blood Glucose Monitoring Suppl (ONE TOUCH ULTRA 2) w/Device KIT Use to check blood sugar 2-3 times a day 1 each 0   cetirizine (ZYRTEC) 10 MG tablet Take by mouth.     D3-50 1.25 MG (50000 UT) capsule Take by mouth.     diazepam  (VALIUM ) 5 MG tablet Take 5-10 mg by mouth every 8 (eight) hours as needed for anxiety.     diphenhydrAMINE  (BENADRYL ) 25 mg capsule Take 25 mg by mouth every 6 (six) hours as needed for allergies.      estradiol  (ESTRACE ) 0.01 % CREA vaginal cream Place 0.5 g vaginally 2 (two) times a week. Place 0.5g nightly for two weeks then twice a week after 42 g 11   fluticasone (FLONASE) 50 MCG/ACT nasal spray Place 2 sprays into both nostrils daily as needed  for allergies.     FREESTYLE TEST STRIPS test strip   5   glipiZIDE  (GLUCOTROL  XL) 5 MG 24 hr tablet Take 1 tablet (5 mg total) by mouth daily with breakfast. 90 tablet 3   Iron-FA-B Cmp-C-Biot-Probiotic (FUSION PLUS) CAPS Take 1 capsule by mouth daily.     ketoconazole  (NIZORAL ) 2 % cream Apply 1 Application topically daily. 60 g 2   losartan -hydrochlorothiazide  (HYZAAR) 100-25 MG per tablet Take 1 tablet by mouth every morning.     omeprazole (PRILOSEC) 40 MG capsule 1 cap(s) orally 20 minutes before breakfast; Duration: 90 days     potassium chloride  (KLOR-CON ) 10 MEQ tablet Take 10 mEq by mouth daily.     Ribociclib Succinate (KISQALI 200 DOSE PO)      sertraline  (ZOLOFT ) 50 MG tablet Take 50 mg by mouth daily with supper.     tirzepatide  (MOUNJARO ) 12.5 MG/0.5ML Pen INJECT 12.5MG  DOSE UNDR THE SKIN ONE TIME PER WEEK 84 mL 2   Vibegron 75 MG TABS Take 1 tablet (75 mg total) by mouth at bedtime. 30 tablet 5   Vitamin D , Ergocalciferol , (DRISDOL ) 1.25 MG (50000 UNIT) CAPS capsule Take 1 capsule (50,000 Units total) by mouth every 7 (seven) days. 12 capsule 0   zolpidem  (AMBIEN ) 10 MG tablet Take 10 mg by mouth at bedtime as needed for sleep.      No current facility-administered medications on file prior to visit.   Allergies  Allergen Reactions   Diclofenac Other (See Comments)    GAS   Glucotrol  [Glipizide ] Nausea Only   Lisinopril Cough   Adhesive [Tape] Rash   Family History  Problem Relation Age of Onset   Breast cancer Mother 75   Heart attack Father        Deceased, 31   COPD Father    Prostate cancer Father 65 - 52   Obesity Sister    Hypertension Brother    Healthy Son    Breast cancer Cousin 71  maternal 1st cousin   Colon cancer Neg Hx    Stomach cancer Neg Hx    Esophageal cancer Neg Hx    PE: BP 120/60   Pulse 84   Ht 5' 4.5 (1.638 m)   Wt 205 lb 6.4 oz (93.2 kg)   LMP 06/16/2005   SpO2 97%   BMI 34.71 kg/m  Wt Readings from Last 10 Encounters:   05/02/24 205 lb 6.4 oz (93.2 kg)  04/18/24 205 lb 6.4 oz (93.2 kg)  02/29/24 206 lb (93.4 kg)  12/29/23 200 lb 3.2 oz (90.8 kg)  12/22/23 200 lb (90.7 kg)  07/01/23 211 lb (95.7 kg)  04/18/23 218 lb (98.9 kg)  04/08/23 212 lb (96.2 kg)  04/07/23 218 lb 6.4 oz (99.1 kg)  03/31/23 218 lb (98.9 kg)   Constitutional: overweight, in NAD Eyes: EOMI, no exophthalmos ENT: no thyromegaly, no cervical lymphadenopathy Cardiovascular: RRR, No MRG Respiratory: CTA B Musculoskeletal: no deformities Skin: no rashes Neurological: + Very mild tremor with outstretched hands  ASSESSMENT: 1. DM2, non-insulin -dependent, now more controlled, without long-term complications, but with hyperglycemia  2. HL  3. Obesity class II  PLAN:  1. Patient with longstanding, previously uncontrolled type 2 diabetes, with improved control lately, on a weekly GLP-1/GIP receptor agonist and sulfonylurea.  At last visit, HbA1c was 5.5%, lower, and sugars were at goal without hypo or hyperglycemic exceptions except for 1 blood sugar at 94 when she forgot to take glipizide .  Due to the decreased kidney function (GFR 34), and the 50% decrease in kidney function since the previous year, I recommended to stop metformin  and also to discuss with oncology to see if any changes were made in her cancer treatment.  I did advise her that a decreased kidney function would predispose her to hypoglycemia and I recommended to be very vigilant for this.  She had a recent kidney function checked by PCP 10 days ago and this was improved. - Of note, she was not able to obtain a CGM. - At today's visit, her sugars are at goal in the morning but she forgot her log at home and she does not remember sugars later in the day.  However, HbA1c at today's visit is only slightly higher than before, at 5.7%.  No need to change her regimen. - I suggested to:  -Patient Instructions  Please continue: - Glipizide  ER 5 mg before b'fast  - Mounjaro  12.5 mg  weekly  Please return in 4-6 months with your sugar log  - we checked her HbA1c: 5.7% (slightly higher, but still at goal) - advised to check sugars at different times of the day - 1x a day, rotating check times - advised for yearly eye exams >> she is UTD - return to clinic in  4-6 months  2. HL - Reviewed latest lipid panel from 09/2023: All fractions at goal - She continues on Lipitor 20 mg daily without side effects  3. Obesity class 2  - She was [facility] at [facility] for binge eating disorder, but not in the last few years - We changed from Ozempic  to Mounjaro  for stronger effect on weight.  She is on a higher dose, 12.5 mg weekly. - She was seen in the weight management clinic - She lost 44 pounds before the last 4 visits combined - she gained 5 pounds since last visit   Lela Fendt, MD PhD [facility] Endocrinology

## 2024-05-02 NOTE — Patient Instructions (Addendum)
 Please continue: - Glipizide  ER 5 mg before b'fast  - Mounjaro  12.5 mg weekly  Please return in 4-6 months with your sugar log

## 2024-05-23 DIAGNOSIS — N289 Disorder of kidney and ureter, unspecified: Secondary | ICD-10-CM | POA: Diagnosis not present

## 2024-06-27 ENCOUNTER — Encounter: Payer: Self-pay | Admitting: *Deleted

## 2024-06-27 NOTE — Progress Notes (Signed)
 "   Integrative Medicine Clinic Visit Note Department of Supportive Oncology      [phone] [facility]  [place], [place] [address] Name:   Barbara Jackson   Reason for Visit:  Follow-up       ASSESSMENT & PLAN:      Barbara Jackson is a 69 y.o. with breast cancer seeking integrative interventions at a follow up visit to the Integrative Oncology clinic. Patient is provided information regarding integrative therapies to increase well-being and improve quality of life.   Individualized Health Plan (also sent to patient thru MyChart)    Recommend over the counter Vit D3 3000 - 4000 IU /day  Trial of Calm supplement - Mg - start 1/2 tsp and increase 2 tsp as tolerated Consider CBT - I Consider investing in the therapist she found helpful-Incorporated integrative body movement She will consider a referral to a counselor here at [facility].   Conventional Care    Agree with current treatment recommendations   Nutrition:      Recommend that patient follow an organic, plant-based, antioxidant-rich, anti-inflammatory whole foods diet.  Increase cruciferous, orange-yellow, green leafy vegetables and heavily pigmented fruits.  Season with ginger, garlic, onions, turmeric, Mediterranean spices.  Increase whole grains, Asian mushrooms and green tea.  Increase deep cold-water  fish and organic poultry. Decrease processed food, sweet drinks, red meat, processed meats, and refined sugars.  Eliminate/reduce alcohol .    Physical Activity  Continue with tai chi and yoga at [facility].   Sleep  Continue with sleep hygiene   Supplements  ? trial of magnesium    TCM/Previous Therapies  ?  ?    Stress reduction?/ Joy  478 Breathing, Finger Labyrinth,      Other                  SUBJECTIVE:      HPI    [name] is a 69 y.o. 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   Interim Integrative Oncology History: (per patient and n/a )   06/27/2024: Patient reports for a follow-up visit. Asked PCP about iron - she will take a break from the iron supplement and then he/she will retest.  10/25:  Vit D = 87.9ng/ml - Questions about if this is too high  Sleep:  Worked on sleep hygiene. Stayed in living room until 9:15 Read and relaxation and journaling before bed. Eliminated screen - TV  30 min before bed Stopped naps Taking melatonin at night - 3 mg   Feels like this has worked well.  Not sure if overall sleep has changed. Still restless at night.  Sometimes cannot get back to sleep.  The ring sleep score has been steady.  This process has made her more aware of schedule. Feels the same during the day - not doing much except when she exercises. Hard to get motivated - maybe depression, anxiety, stress is playing a role. Gives herself news break Gets outside to exercise Trying to get together with friends Had a counselor at [facility] last year for several months but  unsure if helpful. Had a therapist she loved - did body integrative work.  But not in network and cost is a barrier.  Has been on website - signed up for  Tai-chi and yoga floor    Review of Systems  Constitutional:  Positive for fatigue.  HENT: Negative.    Eyes: Negative.   Respiratory: Negative.    Cardiovascular: Negative.   Gastrointestinal: Negative.   Endocrine: Negative.   Musculoskeletal: Negative.   Skin: Negative.   Allergic/Immunologic: Negative.   Neurological: Negative.   Hematological: Negative.    Psychiatric/Behavioral:  Positive for dysphoric mood and sleep disturbance. The patient is nervous/anxious.          OBJECTIVE:     There were no vitals filed for this visit.  Estimated body mass index is 33.93 kg/m as calculated from the following:   Height as of 02/15/24: 1.651 m (5' 5).   Weight as of 05/24/24: 92.5 kg (203 lb 14.4 oz).        Physical Exam Vitals reviewed.  Constitutional:      Appearance: Normal appearance.  HENT:     Head: Normocephalic.  Pulmonary:     Effort: Pulmonary effort is normal.  Musculoskeletal:     Cervical back: Normal range of motion and neck supple.  Skin:    General: Skin is dry.  Neurological:     Mental Status: She is alert and oriented to person, place, and time.  Psychiatric:        Mood and Affect: Mood normal.        Behavior: Behavior normal.        Thought Content: Thought content normal.         Review of Prior Testing?- I have reviewed new patient information, prior oncological records as well as records from other providers and reviewed recent lab work.???   Lab Results  Component Value Date   TSH 1.986 03/11/2023     Lab Results  Component Value Date   ALT 18 05/24/2024   AST 17 05/24/2024   GGT 19 03/11/2023   BILITOT 0.6 05/24/2024    Lab Results  Component Value Date   HGBA1C 6.9 (H) 03/11/2023    No results found for: CHLPL, HDL, LDL, TRIG      On the day of the visit I spent 33 minutes preparing to see the patient, obtaining and/or reviewing separately obtained history, performing a medically appropriate examination and evaluation, counseling and educating the patient/caregiver, documenting clinical information in the electronic medical record, and care coordination (not separately reported) .This time does not include any time spent performing procedures or assesments that are separately billable.    Integrative medicine or integrative healthcare involves bringing conventional and  complementary approaches together and performing coordinated care with the patients specialists/PCP. There is ongoing management and monitoring of at least one complex or serious condition; and building of the relationship between practitioner and patient and shared decision-making for healthcare needs.    Logistics/Consent:  We have discussed the risks, benefits, alternatives, and mechanics of an integrative medicine/oncology approach in extensive detail and the patient wishes to proceed. The patient understands that this is solely a research scientist (medical) service and their primary oncologist remains their main point of contact. ?Any changes to their medical, surgical, or radiation therapy are under the care of the primary team. The patient states understanding and all questions were answered to their apparent satisfaction. Patient was seen and examined by me. I independently obtained and reviewed the history/reports  and performed the physical examination, and I agree with the documentation, assessment and plan as above.    Location Information: Patient State (at time of visit): [place]  Patient Location (at time of visit):Home/Other Non-Medical  Provider Location: Hospital/Provider-Based Clinic Is provider licensed to provide clinical care in the current location/state of the patient? Yes   Consent:  Patient's identity was confirmed. Presenting condition or illness was discussed with the patient/personal representative. Current proposed treatment for presenting condition or illness was explained to patient/personal representative along with the likely benefits and any significant risks or complications associated with the provision of treatment by audio/video means. The patient/personal representative verbally authorized treatment to be provided by audio/video, which may include a limited review of patient's current health status, medication, or other treatment recommendations, patient education, and  an opportunity to ask questions about condition and treatment. Verbal Consent Granted by Patient/Personal Representative:Yes   Visit Information: Modality: 2-Way Real-Time Audio/Video  Video Start Time: 9:38 approx Video Stop Time: 10:04 Video Total Time: 28 min approx        "

## 2024-06-28 ENCOUNTER — Ambulatory Visit (HOSPITAL_BASED_OUTPATIENT_CLINIC_OR_DEPARTMENT_OTHER): Admitting: Cardiology

## 2024-06-28 ENCOUNTER — Encounter (HOSPITAL_BASED_OUTPATIENT_CLINIC_OR_DEPARTMENT_OTHER): Payer: Self-pay | Admitting: Cardiology

## 2024-06-28 VITALS — BP 126/80 | HR 90 | Ht 64.5 in | Wt 212.8 lb

## 2024-06-28 DIAGNOSIS — R002 Palpitations: Secondary | ICD-10-CM | POA: Diagnosis not present

## 2024-06-28 DIAGNOSIS — Z7189 Other specified counseling: Secondary | ICD-10-CM

## 2024-06-28 DIAGNOSIS — Z8249 Family history of ischemic heart disease and other diseases of the circulatory system: Secondary | ICD-10-CM | POA: Diagnosis not present

## 2024-06-28 DIAGNOSIS — I10 Essential (primary) hypertension: Secondary | ICD-10-CM

## 2024-06-28 DIAGNOSIS — R0789 Other chest pain: Secondary | ICD-10-CM | POA: Diagnosis not present

## 2024-06-28 DIAGNOSIS — E782 Mixed hyperlipidemia: Secondary | ICD-10-CM | POA: Diagnosis not present

## 2024-06-28 DIAGNOSIS — Z7689 Persons encountering health services in other specified circumstances: Secondary | ICD-10-CM | POA: Diagnosis not present

## 2024-06-28 NOTE — Patient Instructions (Signed)
 Medication Instructions:  No changes today *If you need a refill on your cardiac medications before your next appointment, please call your pharmacy*   Lab Work: none If you have labs (blood work) drawn today and your tests are completely normal, you will receive your results only by: MyChart Message (if you have MyChart) OR A paper copy in the mail If you have any lab test that is abnormal or we need to change your treatment, we will call you to review the results.   Testing/Procedures: none   Follow-Up: As needed

## 2024-06-28 NOTE — Progress Notes (Signed)
 " Cardiology Office Note:  .   Date:  06/28/2024  ID:  Barbara Jackson, DOB [birth year], MRN [id number] PCP: Kip Righter, MD  [facility] Providers Cardiologist:  Shelda Bruckner, MD {  History of Present Illness: .   Barbara Jackson is a 69 y.o. female with PMH type II diabetes, stage 4 breast cancer, hyperlipidemia, hypertension, obesity. She is seen as a new patient evaluation today. Referral from 05/25/24 reviewed. Reason for referral not listed.  Chart reviewed. She has been following with integrative medicine/integrative oncology at [facility] for management of her breast cancer.  Today: She is concerned as she is having odd sensations in her chest radiating to her both arms, like a chill/fluttering sensation. Also concerned as her younger brother was diagnosed at age 23 with CAD, is now s/p CABG. First cousin had heart issues No other family history of heart disease.  Chest sensations have been going on for several months. Occurred initially the same time as some UTIs. Felt like a chill in the center of her chest that went down both of her arms, arms/hands felt cold. Symptoms got better when UTIs cleared up. Now she has intermittent episodes, nearly daily, lasts a brief time (seconds). These are more of a quick flutter in her chest. Notes that this occurs most frequently she is nervous/anxious. No shortness of breath, nausea, diaphoresis with these. First cousin with unclear heart issues age 23. Not related to activity.   We discussed potential etiologies of her symptoms. She was told she had benign PACs after wearing a heart monitor for a month, wore about 14 years ago with Dr. Shlomo (I cannot see records).   ROS positive for cold hands (pulses checked today)  ROS: Denies shortness of breath at rest or with normal exertion. No PND, orthopnea, LE edema or unexpected weight gain. No syncope. ROS otherwise negative except as noted.   Studies Reviewed: SABRA    EKG:  EKG  Interpretation Date/Time:  Tuesday June 28 2024 15:18:01 EST Ventricular Rate:  81 PR Interval:  146 QRS Duration:  82 QT Interval:  364 QTC Calculation: 422 R Axis:   51  Text Interpretation: Normal sinus rhythm Nonspecific T wave abnormality Confirmed by Bruckner Shelda [phone]) on 06/28/2024 3:39:47 PM    Physical Exam:   VS:  BP 126/80   Pulse 90   Ht 5' 4.5 (1.638 m)   Wt 212 lb 12.8 oz (96.5 kg)   LMP 06/16/2005   SpO2 97%   BMI 35.96 kg/m    Wt Readings from Last 3 Encounters:  06/28/24 212 lb 12.8 oz (96.5 kg)  05/02/24 205 lb 6.4 oz (93.2 kg)  04/18/24 205 lb 6.4 oz (93.2 kg)    GEN: Well nourished, well developed in no acute distress HEENT: Normal, moist mucous membranes NECK: No JVD CARDIAC: regular rhythm, normal S1 and S2, no rubs or gallops. No murmur. VASCULAR: Radial and DP pulses 2+ bilaterally. No carotid bruits RESPIRATORY:  Clear to auscultation without rales, wheezing or rhonchi  ABDOMEN: Soft, non-tender, non-distended MUSCULOSKELETAL:  Ambulates independently SKIN: Warm and dry, no edema NEUROLOGIC:  Alert and oriented x 3. No focal neuro deficits noted. PSYCHIATRIC:  Normal affect    ASSESSMENT AND PLAN: .    Fluttering/palpitations/atypical chest discomfort History of PACs -we discussed potential etiologies at length. No high risk features -discussed potential for monitor for further evaluation. At this time, she will hold off on this, but she will contact me if symptoms worsen -reviewed  potential triggers -reviewed red flag warning signs that need immediate medical attention  Family history of heart disease Mixed hyperlipidemia -lipids per [facility] 09/21/23 show Tchol 155, HDL 81, LDL 50, TG 153 -on atorvastatin  20 mg daily, discussed that this helps with risk reduction -discussed lp(a) today, also discussed there are not targeted treatments for this yet  Hypertension -well controlled on losartan -hydrochlorothiazide    Stage 4 breast  cancer  CV risk counseling and prevention -recommend heart healthy/Mediterranean diet, with whole grains, fruits, vegetable, fish, lean meats, nuts, and olive oil. Limit salt. -recommend moderate walking, 3-5 times/week for 30-50 minutes each session. Aim for at least 150 minutes/week. Goal should be pace of 3 miles/hours, or walking 1.5 miles in 30 minutes -recommend avoidance of tobacco products. Avoid excess alcohol .  Dispo: I would be happy to see her back as needed  Signed, Shelda Bruckner, MD   Shelda Bruckner, MD, PhD, [facility]  [place]  [facility] at [facility] [address] [place], [place] [address] [phone]   "

## 2024-07-15 ENCOUNTER — Ambulatory Visit: Admitting: Obstetrics and Gynecology

## 2024-08-10 ENCOUNTER — Ambulatory Visit: Admitting: Obstetrics and Gynecology

## 2024-09-06 ENCOUNTER — Ambulatory Visit: Admitting: Internal Medicine

## 2024-10-25 ENCOUNTER — Ambulatory Visit: Admitting: Podiatry

## 2025-03-06 ENCOUNTER — Ambulatory Visit: Admitting: Obstetrics and Gynecology

## 2025-03-06 ENCOUNTER — Encounter: Admitting: Obstetrics and Gynecology
